# Patient Record
Sex: Female | Born: 1975 | Race: Black or African American | Hispanic: No | Marital: Married | Smoking: Never smoker
Health system: Southern US, Community
[De-identification: ages and names within clinical notes are randomized; demographics above are authoritative.]

## PROBLEM LIST (undated history)

## (undated) DIAGNOSIS — E282 Polycystic ovarian syndrome: Secondary | ICD-10-CM

## (undated) DIAGNOSIS — E139 Other specified diabetes mellitus without complications: Secondary | ICD-10-CM

## (undated) DIAGNOSIS — M549 Dorsalgia, unspecified: Secondary | ICD-10-CM

## (undated) DIAGNOSIS — D219 Benign neoplasm of connective and other soft tissue, unspecified: Secondary | ICD-10-CM

## (undated) HISTORY — PX: BREAST EXCISIONAL BIOPSY: SUR124

## (undated) HISTORY — DX: Other specified diabetes mellitus without complications: E13.9

## (undated) HISTORY — DX: Polycystic ovarian syndrome: E28.2

## (undated) HISTORY — DX: Dorsalgia, unspecified: M54.9

## (undated) HISTORY — PX: UTERINE FIBROID SURGERY: SHX826

## (undated) HISTORY — PX: BREAST SURGERY: SHX581

---

## 1998-11-22 ENCOUNTER — Emergency Department (HOSPITAL_COMMUNITY): Admission: EM | Admit: 1998-11-22 | Discharge: 1998-11-22 | Payer: Self-pay | Admitting: Emergency Medicine

## 2012-02-09 ENCOUNTER — Emergency Department (INDEPENDENT_AMBULATORY_CARE_PROVIDER_SITE_OTHER): Payer: Self-pay

## 2012-02-09 ENCOUNTER — Encounter (HOSPITAL_COMMUNITY): Payer: Self-pay | Admitting: Emergency Medicine

## 2012-02-09 ENCOUNTER — Emergency Department (INDEPENDENT_AMBULATORY_CARE_PROVIDER_SITE_OTHER)
Admission: EM | Admit: 2012-02-09 | Discharge: 2012-02-09 | Disposition: A | Discharge status: Home / Self Care | Payer: Self-pay | Source: Home / Self Care | Attending: Family Medicine | Admitting: Family Medicine

## 2012-02-09 DIAGNOSIS — S9030XA Contusion of unspecified foot, initial encounter: Secondary | ICD-10-CM

## 2012-02-09 MED ORDER — HYDROCODONE-ACETAMINOPHEN 5-325 MG PO TABS: ORAL_TABLET | ORAL | Status: AC

## 2012-02-09 NOTE — Discharge Instructions (Signed)
Your x-ray was negative for any acute findings or fracture. Wear the hard-soled shoe for at least a week, then try walking without it. If you are pain-free, you can discontinue the shoe. If your pain persists, continue the shoe for another week. If you are still having pain after 2 weeks, then re-present for evaluation. Take ibuprofen or naproxen (Aleve) around the clock for baseline pain control. Use prescription pain medication only as needed and as directed for severe pain.

## 2012-02-09 NOTE — ED Provider Notes (Signed)
History     CSN: [id number]  Arrival date & time 02/09/12  1839   First MD Initiated Contact with Patient 02/09/12 1852      Chief Complaint  Patient presents with  . Foot Pain    (Consider location/radiation/quality/duration/timing/severity/associated sxs/prior treatment) HPI Comments: Mia Wilson presents for evaluation of soreness in her left great toe. She denies any specific injury, reports onset of symptoms approximately 2 weeks ago. She reports pain in the ball of her left great toe with ambulation or with dorsiflexion of the toe. She denies any numbness, tingling, or weakness. There are no obvious sores on her foot. She states that she was concerned also because she has diabetes.  Patient is a 36 y.o. female presenting with lower extremity pain. The history is provided by the patient.  Foot Pain This is a new problem. The current episode started more than 1 week ago. The problem occurs constantly. The problem has not changed since onset.The symptoms are aggravated by walking and standing. The symptoms are relieved by nothing. She has tried nothing for the symptoms.    Past Medical History  Diagnosis Date  . Diabetes mellitus     History reviewed. No pertinent past surgical history.  No family history on file.  History  Substance Use Topics  . Smoking status: Not on file  . Smokeless tobacco: Not on file  . Alcohol Use: No    OB History    Grav Para Term Preterm Abortions TAB SAB Ect Mult Living                  Review of Systems  Constitutional: Negative.   HENT: Negative.   Eyes: Negative.   Respiratory: Negative.   Cardiovascular: Negative.   Gastrointestinal: Negative.   Genitourinary: Negative.   Musculoskeletal: Positive for gait problem.       LEFT great toe pain  Skin: Negative.   Neurological: Negative.     Allergies  Review of patient's allergies indicates no known allergies.  Home Medications   Current Outpatient Rx  Name Route Sig  Dispense Refill  . METFORMIN HCL 1000 MG PO TABS Oral Take 1,000 mg by mouth 2 (two) times daily with a meal.    . HYDROCODONE-ACETAMINOPHEN 5-325 MG PO TABS  Take one to two tablets every 4 to 6 hours as needed for pain 10 tablet 0    LMP 01/25/2012  Physical Exam  Nursing note and vitals reviewed. Constitutional: She is oriented to person, place, and time. She appears well-developed and well-nourished.  HENT:  Head: Normocephalic and atraumatic.  Eyes: EOM are normal.  Neck: Normal range of motion.  Pulmonary/Chest: Effort normal.  Musculoskeletal: Normal range of motion.       Left foot: She exhibits tenderness and bony tenderness.       Feet:  Neurological: She is alert and oriented to person, place, and time.  Skin: Skin is warm and dry.  Psychiatric: Her behavior is normal.    ED Course  Procedures (including critical care time)  Labs Reviewed - No data to display Dg Foot Complete Left  02/09/2012  *RADIOLOGY REPORT*  Clinical Data: Foot pain  LEFT FOOT - COMPLETE 3+ VIEW  Comparison: None  Findings: There is no evidence of fracture or dislocation.  There is no evidence of arthropathy or other focal bone abnormality. Soft tissues are unremarkable.  IMPRESSION:  Negative exam.  Original Report Authenticated By: Rosealee Albee, M.D.     1. Foot contusion  MDM  Xray reviewed by radiologist and myself; findings per above note; no acute findings; given hard sole shoe and hydrocodone PRN        Renaee Munda, MD 02/09/12 2304

## 2012-02-09 NOTE — ED Notes (Signed)
Pt. Stated, I've had a sore lt. Foot for 2 weeks, it hurts on the outside, No injury

## 2012-04-30 ENCOUNTER — Ambulatory Visit: Payer: Self-pay | Admitting: Family Medicine

## 2012-04-30 VITALS — BP 116/73 | HR 54 | Temp 97.7°F | Resp 16 | Ht 65.0 in | Wt 114.8 lb

## 2012-04-30 DIAGNOSIS — M545 Low back pain, unspecified: Secondary | ICD-10-CM

## 2012-04-30 DIAGNOSIS — R350 Frequency of micturition: Secondary | ICD-10-CM

## 2012-04-30 DIAGNOSIS — R634 Abnormal weight loss: Secondary | ICD-10-CM

## 2012-04-30 DIAGNOSIS — R5383 Other fatigue: Secondary | ICD-10-CM

## 2012-04-30 DIAGNOSIS — Z87442 Personal history of urinary calculi: Secondary | ICD-10-CM

## 2012-04-30 DIAGNOSIS — R5381 Other malaise: Secondary | ICD-10-CM

## 2012-04-30 DIAGNOSIS — E282 Polycystic ovarian syndrome: Secondary | ICD-10-CM | POA: Insufficient documentation

## 2012-04-30 DIAGNOSIS — E119 Type 2 diabetes mellitus without complications: Secondary | ICD-10-CM

## 2012-04-30 LAB — COMPREHENSIVE METABOLIC PANEL
ALT: 13 U/L (ref 0–35)
Alkaline Phosphatase: 52 U/L (ref 39–117)
CO2: 24 mEq/L (ref 19–32)
Creat: 0.67 mg/dL (ref 0.50–1.10)
Total Bilirubin: 1.9 mg/dL — ABNORMAL HIGH (ref 0.3–1.2)

## 2012-04-30 LAB — POCT CBC
Granulocyte percent: 38.5 %G (ref 37–80)
HCT, POC: 36.9 % — AB (ref 37.7–47.9)
Lymph, poc: 3.2 (ref 0.6–3.4)
MCHC: 17.3 g/dL — AB (ref 31.8–35.4)
MID (cbc): 0.4 (ref 0–0.9)
POC Granulocyte: 2.2 (ref 2–6.9)
POC LYMPH PERCENT: 55.1 %L — AB (ref 10–50)
POC MID %: 6.4 %M (ref 0–12)
Platelet Count, POC: 8.2 10*3/uL — AB (ref 142–424)
RDW, POC: 371 %

## 2012-04-30 LAB — POCT URINALYSIS DIPSTICK
Bilirubin, UA: NEGATIVE
Glucose, UA: NEGATIVE
Ketones, UA: 15
Leukocytes, UA: NEGATIVE
Nitrite, UA: NEGATIVE

## 2012-04-30 LAB — LIPID PANEL
Cholesterol: 205 mg/dL — ABNORMAL HIGH (ref 0–200)
LDL Cholesterol: 110 mg/dL — ABNORMAL HIGH (ref 0–99)
Total CHOL/HDL Ratio: 2.4 Ratio
VLDL: 8 mg/dL (ref 0–40)

## 2012-04-30 LAB — POCT UA - MICROSCOPIC ONLY: Mucus, UA: NEGATIVE

## 2012-04-30 LAB — POCT GLYCOSYLATED HEMOGLOBIN (HGB A1C): Hemoglobin A1C: 5.8

## 2012-04-30 MED ORDER — LISINOPRIL 5 MG PO TABS: 5.0000 mg | ORAL_TABLET | Freq: Every day | ORAL | Status: DC

## 2012-04-30 MED ORDER — METFORMIN HCL 1000 MG PO TABS: 1000.0000 mg | ORAL_TABLET | Freq: Two times a day (BID) | ORAL | Status: DC

## 2012-04-30 MED ORDER — CIPROFLOXACIN HCL 250 MG PO TABS: 250.0000 mg | ORAL_TABLET | Freq: Two times a day (BID) | ORAL | Status: AC

## 2012-04-30 NOTE — Progress Notes (Signed)
Subjective: Patient moved here from [place] and does not have her regular physician. Her Dr. [place] recently called and one more refill on her metformin. Her sugar was quite high when she is out of the medicine he insisted she go ahead and get a physician here. She is a stay-at-home mom. Other than metformin not on a regular medications. she has felt fatigued a lot lately. She has normal breathing. No chest pain. GI has some loose stools and cramping, maybe since she is taking metformin. He has some nocturia but no other major urinary problems. She has lost some weight. Not a lot of. Has had some right neck pain of late. She has a history of having had a kidney stone worries about the possibility when turning come back. She's not had any true kidney colic. She is a bit., Though was concerned that she is not getting enough proteins and she is Fish back in her diet.  Objective: Pleasant alert healthy-appearing Afro-American female. Throat clear. Neck supple without nodes. Chest is clear to auscultation. Heart regular without any murmurs. Abdomen soft without masses or tenderness. No CVA tenderness. Extremities unremarkable. Feet appear normal. Sensory is grossly intact in her feet.  Assessment: Type 2 diabetes mellitus History of kidney stones back pain Weight loss Fatigue   Plan: Check lab and proceed from there. Last year she had a hemoglobin A1c of 5.9 about October. Results for orders placed in visit on 04/30/12  POCT URINALYSIS DIPSTICK      Component Value Range   Color, UA yellow     Clarity, UA cloudy     Glucose, UA neg     Bilirubin, UA neg     Ketones, UA 15     Spec Grav, UA 1.025     Blood, UA neg     pH, UA 6.0     Protein, UA neg     Urobilinogen, UA 0.2     Nitrite, UA neg     Leukocytes, UA Negative    POCT UA - MICROSCOPIC ONLY      Component Value Range   WBC, Ur, HPF, POC 4-6     RBC, urine, microscopic 2-3     Bacteria, U Microscopic 1+     Mucus, UA neg     Epithelial cells, urine per micros 4-6     Crystals, Ur, HPF, POC neg     Casts, Ur, LPF, POC neg     Yeast, UA neg    POCT CBC      Component Value Range   WBC 5.8  4.6 - 10.2 (K/uL)   Lymph, poc 3.2  0.6 - 3.4    POC LYMPH PERCENT 55.1 (*) 10 - 50 (%L)   MID (cbc) 0.4  0 - 0.9    POC MID % 6.4  0 - 12 (%M)   POC Granulocyte 2.2  2 - 6.9    Granulocyte percent 38.5  37 - 80 (%G)   RBC 4.73  4.04 - 5.48 (M/uL)   Hemoglobin 11.4 (*) 12.2 - 16.2 (g/dL)   HCT, POC 96.0 (*) 45.4 - 47.9 (%)   MCV 78.0 (*) 80 - 97 (fL)   MCH, POC 24.1 (*) 27 - 31.2 (pg)   MCHC 17.3 (*) 31.8 - 35.4 (g/dL)   RDW, POC 098     Platelet Count, POC 8.2 (*) 142 - 424 (K/uL)   MPV    0 - 99.8 (fL)  POCT GLYCOSYLATED HEMOGLOBIN (HGB A1C)  Component Value Range   Hemoglobin A1C 5.8

## 2012-04-30 NOTE — Patient Instructions (Signed)
I recommend reading the American Diabetic Association website. Take aspirin 81 mg one daily

## 2012-05-01 ENCOUNTER — Encounter: Payer: Self-pay | Admitting: Family Medicine

## 2012-08-04 ENCOUNTER — Emergency Department (INDEPENDENT_AMBULATORY_CARE_PROVIDER_SITE_OTHER)
Admission: EM | Admit: 2012-08-04 | Discharge: 2012-08-04 | Disposition: A | Discharge status: Home / Self Care | Payer: Self-pay | Source: Home / Self Care | Attending: Family Medicine | Admitting: Family Medicine

## 2012-08-04 ENCOUNTER — Encounter (HOSPITAL_COMMUNITY): Payer: Self-pay | Admitting: Emergency Medicine

## 2012-08-04 DIAGNOSIS — T781XXA Other adverse food reactions, not elsewhere classified, initial encounter: Secondary | ICD-10-CM

## 2012-08-04 DIAGNOSIS — Z91018 Allergy to other foods: Secondary | ICD-10-CM

## 2012-08-04 DIAGNOSIS — L509 Urticaria, unspecified: Secondary | ICD-10-CM

## 2012-08-04 MED ORDER — EPINEPHRINE 0.3 MG/0.3ML IJ DEVI: 0.3000 mg | Freq: Once | INTRAMUSCULAR | Status: DC

## 2012-08-04 MED ORDER — HYDROXYZINE HCL 50 MG PO TABS: 25.0000 mg | ORAL_TABLET | Freq: Three times a day (TID) | ORAL | Status: AC | PRN

## 2012-08-04 NOTE — ED Notes (Signed)
C/o of frequent allergic reactions where she breaks out in hives. Ate peanut butter yest and felt throat was scratchy. Took benadryl  Which helped. Denies any symptoms today

## 2012-08-04 NOTE — ED Provider Notes (Signed)
History     CSN: [id number]  Arrival date & time 08/04/12  1109   First MD Initiated Contact with Patient 08/04/12 1131      Chief Complaint  Patient presents with  . Allergic Reaction    (Consider location/radiation/quality/duration/timing/severity/associated sxs/prior treatment) HPI Comments: 36 year old female with history of type 2 diabetes and polycystic ovarian syndrome. Here complaining of intermittent episodes of hives and throat discomfort after she eats certain foods like tomatoes or peanut butter. Patient states that she had these symptoms at least twice a month for about 2 years. Appear becoming worse. Last time was just today she ate peanut butter broke out in hives around her face had throat discomfort and labored breathing. Denies lip or tongue swelling. No wheezing. No dizziness. No nausea vomiting or diarrhea. Patient took a 50 mg Benadryl pill and went to sleep and woke up with no symptoms. Has had similar symptoms when drinking tomato soup and also sometimes she thinks her symptoms have been triggered by stress. Denies any new medications or other known exposures. Patient reports feeling well today with no symptoms or other complaints.   Past Medical History  Diagnosis Date  . Diabetes mellitus   . Back pain   . PCOS (polycystic ovarian syndrome)     Past Surgical History  Procedure Date  . Uterine fibroid surgery   . Breast surgery     benign    No family history on file.  History  Substance Use Topics  . Smoking status: Never Smoker   . Smokeless tobacco: Not on file  . Alcohol Use: No    OB History    Grav Para Term Preterm Abortions TAB SAB Ect Mult Living                  Review of Systems  Constitutional: Negative for fever, chills, appetite change and fatigue.  HENT: Negative for congestion, sore throat, mouth sores, trouble swallowing and voice change.   Respiratory: Negative for shortness of breath, wheezing and stridor.     Gastrointestinal: Negative for nausea, vomiting and diarrhea.  Neurological: Negative for dizziness and headaches.  All other systems reviewed and are negative.    Allergies  Review of patient's allergies indicates no known allergies.  Home Medications   Current Outpatient Rx  Name Route Sig Dispense Refill  . EPINEPHRINE 0.3 MG/0.3ML IJ DEVI Intramuscular Inject 0.3 mLs (0.3 mg total) into the muscle once. 1 Device 0  . HYDROXYZINE HCL 50 MG PO TABS Oral Take 0.5 tablets (25 mg total) by mouth every 8 (eight) hours as needed for itching. 20 tablet 0  . LISINOPRIL 5 MG PO TABS Oral Take 1 tablet (5 mg total) by mouth daily. 90 tablet 1  . METFORMIN HCL 1000 MG PO TABS Oral Take 1 tablet (1,000 mg total) by mouth 2 (two) times daily with a meal. 180 tablet 1    BP 112/69  Pulse 62  Temp 97.9 F (36.6 C) (Oral)  Resp 16  SpO2 100%  LMP 07/27/2012  Physical Exam  Nursing note and vitals reviewed. Constitutional: She is oriented to person, place, and time. She appears well-developed and well-nourished. No distress.  HENT:  Head: Normocephalic and atraumatic.  Mouth/Throat: Oropharynx is clear and moist. No oropharyngeal exudate.  Eyes: Conjunctivae normal are normal.  Neck: Neck supple. No thyromegaly present.  Cardiovascular: Normal rate, regular rhythm and normal heart sounds.   Pulmonary/Chest: Effort normal and breath sounds normal. No respiratory distress. She has no  wheezes. She has no rales.  Abdominal: Soft. There is no tenderness.  Lymphadenopathy:    She has no cervical adenopathy.  Neurological: She is alert and oriented to person, place, and time.  Skin: No rash noted.    ED Course  Procedures (including critical care time)  Labs Reviewed - No data to display No results found.   1. Hives   2. Multiple food allergies       MDM  Impress mild to moderate food allergy?. Normal physical exam today. Prescribed hydroxyzine when necessary as per patient  request provided EpiPen. Denies history of cardiac problems. Discussed severe anaphylaxis symptoms and provided detailed instructions for the use of EpiPen also discussed possible related side effects. Patient will keep a monthly diary to record symptoms and possible triggers before her next appointment with her primary Dr.        Sharin Grave, MD 08/08/12 [phone]

## 2013-05-16 ENCOUNTER — Other Ambulatory Visit: Payer: Self-pay | Admitting: Obstetrics and Gynecology

## 2013-05-16 ENCOUNTER — Other Ambulatory Visit (HOSPITAL_COMMUNITY)
Admission: RE | Admit: 2013-05-16 | Discharge: 2013-05-16 | Disposition: A | Discharge status: Home / Self Care | Payer: BC Managed Care – PPO | Source: Ambulatory Visit | Attending: Obstetrics and Gynecology | Admitting: Obstetrics and Gynecology

## 2013-05-16 DIAGNOSIS — Z1151 Encounter for screening for human papillomavirus (HPV): Secondary | ICD-10-CM | POA: Insufficient documentation

## 2013-05-16 DIAGNOSIS — Z01419 Encounter for gynecological examination (general) (routine) without abnormal findings: Secondary | ICD-10-CM | POA: Insufficient documentation

## 2013-05-29 ENCOUNTER — Other Ambulatory Visit: Payer: Self-pay | Admitting: Endocrinology

## 2013-05-29 DIAGNOSIS — E119 Type 2 diabetes mellitus without complications: Secondary | ICD-10-CM

## 2013-05-29 DIAGNOSIS — E7800 Pure hypercholesterolemia, unspecified: Secondary | ICD-10-CM

## 2013-06-01 ENCOUNTER — Other Ambulatory Visit: Payer: BC Managed Care – PPO

## 2013-06-06 ENCOUNTER — Ambulatory Visit: Payer: Self-pay | Admitting: Endocrinology

## 2013-06-16 ENCOUNTER — Other Ambulatory Visit (INDEPENDENT_AMBULATORY_CARE_PROVIDER_SITE_OTHER): Payer: BC Managed Care – PPO

## 2013-06-16 DIAGNOSIS — E119 Type 2 diabetes mellitus without complications: Secondary | ICD-10-CM

## 2013-06-16 DIAGNOSIS — E7800 Pure hypercholesterolemia, unspecified: Secondary | ICD-10-CM

## 2013-06-16 LAB — BASIC METABOLIC PANEL
BUN: 10 mg/dL (ref 6–23)
Calcium: 9.3 mg/dL (ref 8.4–10.5)
GFR: 108.96 mL/min (ref >60.00)
Glucose, Bld: 121 mg/dL — ABNORMAL HIGH (ref 70–99)
Sodium: 136 mEq/L (ref 135–145)

## 2013-06-16 LAB — URINALYSIS
Bilirubin Urine: NEGATIVE
Ketones, ur: NEGATIVE
Leukocytes, UA: NEGATIVE
Specific Gravity, Urine: >=1.03 (ref 1.000–1.030)
Urobilinogen, UA: 0.2 (ref 0.0–1.0)

## 2013-06-16 LAB — LIPID PANEL
HDL: 78.3 mg/dL (ref >39.00)
Total CHOL/HDL Ratio: 2
VLDL: 16.6 mg/dL (ref 0.0–40.0)

## 2013-06-20 ENCOUNTER — Ambulatory Visit (INDEPENDENT_AMBULATORY_CARE_PROVIDER_SITE_OTHER): Payer: BC Managed Care – PPO | Admitting: Endocrinology

## 2013-06-20 ENCOUNTER — Encounter: Payer: Self-pay | Admitting: Endocrinology

## 2013-06-20 VITALS — BP 102/54 | HR 60 | Temp 98.6°F | Resp 10 | Ht 66.0 in | Wt 121.6 lb

## 2013-06-20 DIAGNOSIS — IMO0001 Reserved for inherently not codable concepts without codable children: Secondary | ICD-10-CM

## 2013-06-20 DIAGNOSIS — E1165 Type 2 diabetes mellitus with hyperglycemia: Secondary | ICD-10-CM | POA: Insufficient documentation

## 2013-06-20 MED ORDER — SITAGLIP PHOS-METFORMIN HCL ER 100-1000 MG PO TB24: 100.0000 mg | ORAL_TABLET | Freq: Every day | ORAL | Status: DC

## 2013-06-20 NOTE — Progress Notes (Signed)
[name], female   DOB: 08/10/1976, 37 y.o.   MRN: [id number]  Reason for Appointment: Diabetes follow-up   History of Present Illness   Diagnosis: date of diagnosis: 2009, initially asymptomatic, has type 2 diabetes.   PAST history:  She was apparently diagnosed to have diabetes by her gynecologist when she was complaining of excessive fatigue. She does not know what her initial blood sugar was but her A1c was 6.8. Before she started on medications her blood sugars were as high as 200+ after meals. She was started on medications after seeing a couple of different physicians and initially did well with 500 mg twice a day. In 2012 her A1c was relatively higher at 6.2 and although sugars are fairly good she was told to take metformin twice a day. This improved her blood sugars and her A1c was 5.9 in 12/12.   RECENT history: She tends to have diarrhea with metformin especially twice a day and also was losing weight. Because of this intolerance and relatively higher readings she was switched to Janumet. However because of her insurance is not covering this she stopped this 2-3 months ago  On her own she has been taking the 1000 mg metformin only at dinnertime.  She thinks her FASTING and after meals blood sugars are relatively higher. She has less  diarrhea with a lower dose of metformin   Side effects from medications: Diarrhea with metfomin, mild with 1g. She does take this with food  Monitors blood glucose: Occasionally.  Glucometer: One Touch.  Blood Glucose readings: Before breakfast: upto 140 Pc <150, did not bring her monitor  Hypoglycemia tends to occur: never.   Meals: 3 meals per day, vegetarian, tries to get protein, counts carbs.  Physical activity: exercise: 30 min per week.   Dietician visit: Most recent: 03/2013  Difficulties with medication adherence: Yes has side effects from taking metformin twice a day and is using this only daily in evening.   The last  HbgA1c was 6.3% on 02/24/13 and 5.9 in 12/12.   HYPERLIPIDEMIA:  previously had LDL of 108, now 95  Appointment on 06/16/2013  Component Date Value Range Status  . Hemoglobin A1C 06/16/2013 7.1* 4.6 - 6.5 % Final   Glycemic Control Guidelines for People with Diabetes:Non Diabetic:  <6%Goal of Therapy: <7%Additional Action Suggested:  >8%   . Sodium 06/16/2013 136  135 - 145 mEq/L Final  . Potassium 06/16/2013 4.0  3.5 - 5.1 mEq/L Final  . Chloride 06/16/2013 103  96 - 112 mEq/L Final  . CO2 06/16/2013 26  19 - 32 mEq/L Final  . Glucose, Bld 06/16/2013 121* 70 - 99 mg/dL Final  . BUN 78/29/5621 10  6 - 23 mg/dL Final  . Creatinine, Ser 06/16/2013 0.8  0.4 - 1.2 mg/dL Final  . Calcium 30/86/5784 9.3  8.4 - 10.5 mg/dL Final  . GFR 69/62/9528 108.96  >60.00 mL/min Final  . Color, Urine 06/16/2013 LT. YELLOW  Yellow;Lt. Yellow Final  . APPearance 06/16/2013 CLEAR  Clear Final  . Specific Gravity, Urine 06/16/2013 >=1.030  1.000 - 1.030 Final  . pH 06/16/2013 6.0  5.0 - 8.0 Final  . Total Protein, Urine 06/16/2013 NEGATIVE  Negative Final  . Urine Glucose 06/16/2013 NEGATIVE  Negative Final  . Ketones, ur 06/16/2013 NEGATIVE  Negative Final  . Bilirubin Urine 06/16/2013 NEGATIVE  Negative Final  . Hgb urine dipstick 06/16/2013 TRACE-LYSED  Negative Final  . Urobilinogen, UA 06/16/2013 0.2  0.0 - 1.0 Final  .  Leukocytes, UA 06/16/2013 NEGATIVE  Negative Final  . Nitrite 06/16/2013 NEGATIVE  Negative Final  . Microalb, Ur 06/16/2013 0.9  0.0 - 1.9 mg/dL Final  . Creatinine,U 40/98/1191 206.2   Final  . Microalb Creat Ratio 06/16/2013 0.4  0.0 - 30.0 mg/g Final  . Cholesterol 06/16/2013 190  0 - 200 mg/dL Final   ATP III Classification       Desirable:  < 200 mg/dL               Borderline High:  200 - 239 mg/dL          High:  > = 478 mg/dL  . Triglycerides 06/16/2013 83.0  0.0 - 149.0 mg/dL Final   Normal:  <295 mg/dLBorderline High:  150 - 199 mg/dL  . HDL 06/16/2013 78.30  >39.00 mg/dL  Final  . VLDL 62/13/0865 16.6  0.0 - 40.0 mg/dL Final  . LDL Cholesterol 06/16/2013 95  0 - 99 mg/dL Final  . Total CHOL/HDL Ratio 06/16/2013 2   Final                  Men          Women1/2 Average Risk     3.4          3.3Average Risk          5.0          4.42X Average Risk          9.6          7.13X Average Risk          15.0          11.0                          Medication List       This list is accurate as of: 06/20/13  8:30 AM.  Always use your most recent med list.               EPINEPHrine 0.3 mg/0.3 mL Devi  Commonly known as:  EPIPEN  Inject 0.3 mLs (0.3 mg total) into the muscle once.     metFORMIN 1000 MG tablet  Commonly known as:  GLUCOPHAGE  Take 1 tablet (1,000 mg total) by mouth 2 (two) times daily with a meal.        Allergies: No Known Allergies  Past Medical History  Diagnosis Date  . Diabetes mellitus   . Back pain   . PCOS (polycystic ovarian syndrome)     Past Surgical History  Procedure Laterality Date  . Uterine fibroid surgery    . Breast surgery      benign    No family history on file.  Social History:  reports that she has never smoked. She does not have any smokeless tobacco history on file. She reports that she does not drink alcohol or use illicit drugs.  Review of Systems -  No history of hypertension  Normal lipids without medication  Has history of uterine fibroids   Examination:   BP 102/54  Pulse 60  Temp(Src) 98.6 F (37 C)  Resp 10  Ht 5\' 6"  (1.676 m)  Wt 121 lb 9.6 oz (55.157 kg)  BMI 19.64 kg/m2  SpO2 99%  LMP 06/12/2013  Body mass index is 19.64 kg/(m^2).   Assesment:   DIABETES:  The patient's diabetes control appears to be not as good with metformin alone and she had  previously done better with Janumet. A1c is higher than usual at 7.1 Because of her difficulty affording her Janumet she has not taken this but she thinks it will be covered on her new plan She is otherwise doing fairly well with  her diet and exercise regimen and still has a relatively low body weight   PLAN:   1. To check home blood sugars on waking up or 2 hours after any meal  2. Walking or other exercise for up to 30 minutes every day.  3. Medication changes: Go back to Janumet XR, will limit the metformin to 1000 mg   Mia Wilson 06/20/2013, 8:30 AM

## 2013-06-20 NOTE — Patient Instructions (Addendum)
Janumet XR 100/1000 once daily Restart testing

## 2013-08-21 ENCOUNTER — Other Ambulatory Visit (INDEPENDENT_AMBULATORY_CARE_PROVIDER_SITE_OTHER): Payer: BC Managed Care – PPO

## 2013-08-21 ENCOUNTER — Encounter (HOSPITAL_COMMUNITY): Payer: Self-pay | Admitting: Family Medicine

## 2013-08-21 ENCOUNTER — Emergency Department (HOSPITAL_COMMUNITY)
Admission: EM | Admit: 2013-08-21 | Discharge: 2013-08-22 | Disposition: A | Discharge status: Home / Self Care | Payer: BC Managed Care – PPO | Attending: Emergency Medicine | Admitting: Emergency Medicine

## 2013-08-21 ENCOUNTER — Emergency Department (HOSPITAL_COMMUNITY): Payer: BC Managed Care – PPO

## 2013-08-21 DIAGNOSIS — Z79899 Other long term (current) drug therapy: Secondary | ICD-10-CM | POA: Insufficient documentation

## 2013-08-21 DIAGNOSIS — Z3202 Encounter for pregnancy test, result negative: Secondary | ICD-10-CM | POA: Insufficient documentation

## 2013-08-21 DIAGNOSIS — E119 Type 2 diabetes mellitus without complications: Secondary | ICD-10-CM | POA: Insufficient documentation

## 2013-08-21 DIAGNOSIS — N200 Calculus of kidney: Secondary | ICD-10-CM | POA: Insufficient documentation

## 2013-08-21 DIAGNOSIS — R109 Unspecified abdominal pain: Secondary | ICD-10-CM

## 2013-08-21 DIAGNOSIS — R110 Nausea: Secondary | ICD-10-CM | POA: Insufficient documentation

## 2013-08-21 DIAGNOSIS — Z8742 Personal history of other diseases of the female genital tract: Secondary | ICD-10-CM | POA: Insufficient documentation

## 2013-08-21 HISTORY — DX: Benign neoplasm of connective and other soft tissue, unspecified: D21.9

## 2013-08-21 LAB — POCT I-STAT, CHEM 8
BUN: 14 mg/dL (ref 6–23)
Calcium, Ion: 1.17 mmol/L (ref 1.12–1.23)
Chloride: 103 mEq/L (ref 96–112)
Creatinine, Ser: 1 mg/dL (ref 0.50–1.10)
Glucose, Bld: 159 mg/dL — ABNORMAL HIGH (ref 70–99)
Sodium: 138 mEq/L (ref 135–145)
TCO2: 23 mmol/L (ref 0–100)

## 2013-08-21 LAB — BASIC METABOLIC PANEL
Calcium: 9.2 mg/dL (ref 8.4–10.5)
GFR: 99.71 mL/min (ref >60.00)
Sodium: 138 mEq/L (ref 135–145)

## 2013-08-21 LAB — URINALYSIS, ROUTINE W REFLEX MICROSCOPIC
Ketones, ur: NEGATIVE mg/dL
Leukocytes, UA: NEGATIVE
Nitrite: NEGATIVE
Protein, ur: 30 mg/dL — AB

## 2013-08-21 LAB — URINE MICROSCOPIC-ADD ON

## 2013-08-21 LAB — GLUCOSE, CAPILLARY: Glucose-Capillary: 101 mg/dL — ABNORMAL HIGH (ref 70–99)

## 2013-08-21 MED ORDER — OXYCODONE-ACETAMINOPHEN 5-325 MG PO TABS: 1.0000 | ORAL_TABLET | Freq: Four times a day (QID) | ORAL | Status: DC | PRN

## 2013-08-21 MED ORDER — SODIUM CHLORIDE 0.9 % IV BOLUS (SEPSIS)
1000.0000 mL | Freq: Once | INTRAVENOUS | Status: AC
  Administered 2013-08-21: 1000 mL via INTRAVENOUS

## 2013-08-21 MED ORDER — KETOROLAC TROMETHAMINE 30 MG/ML IJ SOLN
30.0000 mg | Freq: Once | INTRAMUSCULAR | Status: AC
  Administered 2013-08-21: 30 mg via INTRAVENOUS
  Filled 2013-08-21: qty 1

## 2013-08-21 MED ORDER — MORPHINE SULFATE 4 MG/ML IJ SOLN
4.0000 mg | Freq: Once | INTRAMUSCULAR | Status: AC
  Administered 2013-08-21: 4 mg via INTRAVENOUS
  Filled 2013-08-21: qty 1

## 2013-08-21 MED ORDER — OXYCODONE-ACETAMINOPHEN 5-325 MG PO TABS
1.0000 | ORAL_TABLET | Freq: Once | ORAL | Status: AC
  Administered 2013-08-22: 1 via ORAL
  Filled 2013-08-21: qty 1

## 2013-08-21 MED ORDER — TAMSULOSIN HCL 0.4 MG PO CAPS: 0.4000 mg | ORAL_CAPSULE | Freq: Every day | ORAL | Status: DC

## 2013-08-21 MED ORDER — ONDANSETRON HCL 4 MG/2ML IJ SOLN
4.0000 mg | Freq: Once | INTRAMUSCULAR | Status: AC
  Administered 2013-08-21: 4 mg via INTRAVENOUS
  Filled 2013-08-21: qty 2

## 2013-08-21 MED ORDER — ONDANSETRON 4 MG PO TBDP: ORAL_TABLET | ORAL | Status: DC

## 2013-08-21 NOTE — ED Notes (Signed)
Unable to obtain blood for labs at this time due to patient being transported to [place]

## 2013-08-21 NOTE — ED Notes (Signed)
Patient states that she has had right flank pain since yesterday. States pain has gotten worse since yesterday. States urine has been dark and having lower abdominal cramping for about a week. States urine has had a strong smell to it. States she does have a history of kidney stones.

## 2013-08-21 NOTE — ED Provider Notes (Signed)
CSN: [id number]     Arrival date & time 08/21/13  1959 History   First MD Initiated Contact with Patient 08/21/13 2208     Chief Complaint  Patient presents with  . Flank Pain   (Consider location/radiation/quality/duration/timing/severity/associated sxs/prior Treatment) HPI Comments: Intermittent R flank pain. Thought she had a UTI. Patient states similar to prior kidney stones.   Patient is a 37 y.o. female presenting with flank pain. The history is provided by the patient.  Flank Pain This is a recurrent problem. The current episode started yesterday. The problem occurs daily. The problem has been gradually worsening. Associated symptoms include abdominal pain. Pertinent negatives include no chest pain and no shortness of breath. Nothing aggravates the symptoms. The symptoms are relieved by medications.    Past Medical History  Diagnosis Date  . Diabetes mellitus   . Back pain   . PCOS (polycystic ovarian syndrome)   . Fibroids    Past Surgical History  Procedure Laterality Date  . Uterine fibroid surgery    . Breast surgery      benign   No family history on file. History  Substance Use Topics  . Smoking status: Never Smoker   . Smokeless tobacco: Not on file  . Alcohol Use: No   OB History   Grav Para Term Preterm Abortions TAB SAB Ect Mult Living                 Review of Systems  Constitutional: Negative for fever.  Respiratory: Negative for cough and shortness of breath.   Cardiovascular: Negative for chest pain.  Gastrointestinal: Positive for nausea and abdominal pain. Negative for vomiting.  Genitourinary: Positive for flank pain.  All other systems reviewed and are negative.    Allergies  Review of patient's allergies indicates no known allergies.  Home Medications   Current Outpatient Rx  Name  Route  Sig  Dispense  Refill  . EPINEPHrine (EPIPEN) 0.3 mg/0.3 mL DEVI   Intramuscular   Inject 0.3 mLs (0.3 mg total) into the muscle once.   1  Device   0   . Fe Fum-FePoly-FA-Vit C-Vit B3 (INTEGRA F PO)   Oral   Take by mouth.         . Multiple Vitamin (MULTIVITAMIN WITH MINERALS) TABS   Oral   Take 1 tablet by mouth daily.         . Norethin Ace-Eth Estrad-FE (LOMEDIA 24 FE PO)   Oral   Take by mouth daily.         . SitaGLIPtin-MetFORMIN HCl (JANUMET XR) [phone] MG TB24   Oral   Take 100-1,000 mg by mouth daily after supper.   30 tablet   2    BP 106/73  Pulse 72  Temp(Src) 98 F (36.7 C) (Oral)  Resp 20  Ht 5\' 5"  (1.651 m)  Wt 119 lb (53.978 kg)  BMI 19.8 kg/m2  SpO2 100%  LMP 08/09/2013 Physical Exam  Nursing note and vitals reviewed. Constitutional: She is oriented to person, place, and time. She appears well-developed and well-nourished. No distress.  HENT:  Head: Normocephalic and atraumatic.  Eyes: EOM are normal. Pupils are equal, round, and reactive to light.  Neck: Normal range of motion. Neck supple.  Cardiovascular: Normal rate and regular rhythm.  Exam reveals no friction rub.   No murmur heard. Pulmonary/Chest: Effort normal and breath sounds normal. No respiratory distress. She has no wheezes. She has no rales.  Abdominal: Soft. She exhibits no distension.  There is no tenderness (no CVA tenderness). There is no rebound.  Musculoskeletal: Normal range of motion. She exhibits no edema.  Neurological: She is alert and oriented to person, place, and time.  Skin: She is not diaphoretic.    ED Course  Procedures (including critical care time) Labs Review Labs Reviewed  GLUCOSE, CAPILLARY - Abnormal; Notable for the following:    Glucose-Capillary 101 (*)    All other components within normal limits  URINALYSIS, ROUTINE W REFLEX MICROSCOPIC - Abnormal; Notable for the following:    Color, Urine AMBER (*)    APPearance CLOUDY (*)    Specific Gravity, Urine 1.033 (*)    Hgb urine dipstick LARGE (*)    Bilirubin Urine SMALL (*)    Protein, ur 30 (*)    All other components within  normal limits  URINE MICROSCOPIC-ADD ON - Abnormal; Notable for the following:    Bacteria, UA FEW (*)    Crystals CA OXALATE CRYSTALS (*)    All other components within normal limits  POCT PREGNANCY, URINE   Imaging Review Ct Abdomen Pelvis Wo Contrast  08/21/2013   CLINICAL DATA:  Right flank pain.  EXAM: CT ABDOMEN AND PELVIS WITHOUT CONTRAST  TECHNIQUE: Multidetector CT imaging of the abdomen and pelvis was performed following the standard protocol without intravenous contrast.  COMPARISON:  None.  FINDINGS: The lung bases are clear.  The unenhanced appearance of the liver and spleen are unremarkable. No biliary dilatation. The gallbladder is normal. No common bowel duct dilatation. The pancreas appears normal. The adrenal glands and left kidney are normal. The right kidney demonstrates moderate hydronephrosis. There is also right-sided hydronephrosis down to a small, 3 x 1.5 mm mid ureteral calculus located at the L4 vertebral body level.  The stomach, duodenum, small bowel and colon are grossly normal without oral contrast. No mesenteric or retroperitoneal mass or adenopathy. The aorta is normal.  The uterus is enlarged. Suspect multiple fibroids. There is a probable cyst associated with the right ovary. No pelvic mass or adenopathy. No inguinal mass or adenopathy.  The bony structures are unremarkable.  IMPRESSION: 3 x 1.5 mm mid right ureteral calculus located at the L4 vertebral body level causing moderate hydroureteronephrosis.  Enlarged uterus with probable multiple fibroids.   Electronically Signed   By: Loralie Champagne M.D.   On: 08/21/2013 22:32    MDM   1. Kidney stone   2. Abdominal pain   3. Flank pain    40F here with R flank pain, radiates to R groin. Similar to prior kidney stones. Some urinary symptoms over past few days, associated nausea without vomiting.  AFVSS. R flank pain, no CVA tenderness. No fevers. UA with blood, will CT to look for stones. No prior interventions  required for stones.  No UTI, normal creatinine.  Kidney stone seen on CT scan. Small, will likely pass. Will give pain meds, nausea meds, flomax. Given Urology f/u. Stable for discharge.   I have reviewed all labs and imaging and considered them in my medical decision making.    Dagmar Hait, MD 08/21/13 [phone]

## 2013-08-25 ENCOUNTER — Ambulatory Visit (INDEPENDENT_AMBULATORY_CARE_PROVIDER_SITE_OTHER): Payer: BC Managed Care – PPO | Admitting: Endocrinology

## 2013-08-25 ENCOUNTER — Encounter: Payer: Self-pay | Admitting: Endocrinology

## 2013-08-25 VITALS — BP 110/72 | HR 66 | Temp 98.3°F | Resp 10 | Ht 65.0 in | Wt 127.2 lb

## 2013-08-25 DIAGNOSIS — R5381 Other malaise: Secondary | ICD-10-CM

## 2013-08-25 MED ORDER — GLIMEPIRIDE 1 MG PO TABS
ORAL_TABLET | ORAL | Status: DC
Start: 2013-08-25 — End: 2013-12-27

## 2013-08-25 NOTE — Progress Notes (Signed)
[name], female   DOB: July 23, 1976, 37 y.o.   MRN: [id number]  Reason for Appointment: Diabetes follow-up   History of Present Illness   Diagnosis: date of diagnosis: 2009, initially asymptomatic, has type 2 diabetes.   PAST history:  She was apparently diagnosed to have diabetes by her gynecologist when she was complaining of excessive fatigue. She does not know what her initial blood sugar was but her A1c was 6.8. Before she started on medications her blood sugars were as high as 200+ after meals. She was started on medications after seeing a couple of different physicians and initially did well with 500 mg twice a day. In 2012 her A1c was relatively higher at 6.2 and although sugars are fairly good she was told to take metformin twice a day. This improved her blood sugars and her A1c was 5.9 in 12/12.   RECENT history:  Because of diarrhea and weight loss with metformin 2 g and relatively higher readings she was switched to Janumet XR 100/1000.  She did restart this on her last visit when she had a new insurance and had no difficulty with coverage Does not have any significant diarrhea now and weight has improved.  She did try an extra metformin in the evening once but got diarrhea with this, however her glucose was 114 the next morning She does appear to have relatively high readings in the mornings however. Generally not checking readings after meals She does think also that she has had more stress lately   Side effects from medications: Diarrhea with metfomin, 2 g She does take this with food  Monitors blood glucose: Occasionally.  Glucometer: One Touch.  Blood Glucose readings: Before breakfast: 114-154, nonfasting 104, 206, 138  Hypoglycemia tends to occur: None   Meals: 3 meals per day, vegetarian, tries to get protein, counts carbs.  Physical activity: exercise: Recently only once per week  Dietician visit: Most recent: 03/2013  Difficulties with medication  adherence: None  The previous HbgA1c was 6.3% on 02/24/13 and 5.9 in 12/12.  Lab Results  Component Value Date   HGBA1C 7.1* 06/16/2013    HYPERLIPIDEMIA:  previously had LDL of 108, now 95  Admission on 08/21/2013, Discharged on 08/22/2013  Component Date Value Range Status  . Glucose-Capillary 08/21/2013 101* 70 - 99 mg/dL Final  . Color, Urine 41/32/4401 AMBER* YELLOW Final   BIOCHEMICALS MAY BE AFFECTED BY COLOR  . APPearance 08/21/2013 CLOUDY* CLEAR Final  . Specific Gravity, Urine 08/21/2013 1.033* 1.005 - 1.030 Final  . pH 08/21/2013 5.5  5.0 - 8.0 Final  . Glucose, UA 08/21/2013 NEGATIVE  NEGATIVE mg/dL Final  . Hgb urine dipstick 08/21/2013 LARGE* NEGATIVE Final  . Bilirubin Urine 08/21/2013 SMALL* NEGATIVE Final  . Ketones, ur 08/21/2013 NEGATIVE  NEGATIVE mg/dL Final  . Protein, ur 02/72/5366 30* NEGATIVE mg/dL Final  . Urobilinogen, UA 08/21/2013 1.0  0.0 - 1.0 mg/dL Final  . Nitrite 44/01/4741 NEGATIVE  NEGATIVE Final  . Leukocytes, UA 08/21/2013 NEGATIVE  NEGATIVE Final  . Preg Test, Ur 08/21/2013 NEGATIVE  NEGATIVE Final   Comment:                                 THE SENSITIVITY OF THIS                          METHODOLOGY IS >24 mIU/mL  . Squamous Epithelial /  LPF 08/21/2013 RARE  RARE Final  . WBC, UA 08/21/2013 0-2  <3 WBC/hpf Final  . RBC / HPF 08/21/2013 TOO NUMEROUS TO COUNT  <3 RBC/hpf Final  . Bacteria, UA 08/21/2013 FEW* RARE Final  . Crystals 08/21/2013 CA OXALATE CRYSTALS* NEGATIVE Final  . Urine-Other 08/21/2013 MUCOUS PRESENT   Final  . Sodium 08/21/2013 138  135 - 145 mEq/L Final  . Potassium 08/21/2013 4.4  3.5 - 5.1 mEq/L Final  . Chloride 08/21/2013 103  96 - 112 mEq/L Final  . BUN 08/21/2013 14  6 - 23 mg/dL Final  . Creatinine, Ser 08/21/2013 1.00  0.50 - 1.10 mg/dL Final  . Glucose, Bld 09/81/1914 159* 70 - 99 mg/dL Final  . Calcium, Ion 78/29/5621 1.17  1.12 - 1.23 mmol/L Final  . TCO2 08/21/2013 23  0 - 100 mmol/L Final  . Hemoglobin  08/21/2013 14.3  12.0 - 15.0 g/dL Final  . HCT 30/86/5784 42.0  36.0 - 46.0 % Final  Appointment on 08/21/2013  Component Date Value Range Status  . Sodium 08/21/2013 138  135 - 145 mEq/L Final  . Potassium 08/21/2013 3.8  3.5 - 5.1 mEq/L Final  . Chloride 08/21/2013 105  96 - 112 mEq/L Final  . CO2 08/21/2013 26  19 - 32 mEq/L Final  . Glucose, Bld 08/21/2013 138* 70 - 99 mg/dL Final  . BUN 69/62/9528 11  6 - 23 mg/dL Final  . Creatinine, Ser 08/21/2013 0.8  0.4 - 1.2 mg/dL Final  . Calcium 41/32/4401 9.2  8.4 - 10.5 mg/dL Final  . GFR 02/72/5366 99.71  >60.00 mL/min Final  . Fructosamine 08/21/2013 345* <285 umol/L Final   Comment:                            Variations in levels of serum proteins (albumin and immunoglobulins)                          may affect fructosamine results.                                 Medication List       This list is accurate as of: 08/25/13  8:40 AM.  Always use your most recent med list.               BAYER CONTOUR NEXT TEST test strip  Generic drug:  glucose blood  daily.     EPINEPHrine 0.3 mg/0.3 mL Devi  Commonly known as:  EPIPEN  Inject 0.3 mLs (0.3 mg total) into the muscle once.     INTEGRA F PO  Take by mouth.     LOMEDIA 24 FE PO  Take by mouth daily.     multivitamin with minerals Tabs tablet  Take 1 tablet by mouth daily.     ondansetron 4 MG disintegrating tablet  Commonly known as:  ZOFRAN ODT  4mg  ODT q4 hours prn nausea/vomit     oxyCODONE-acetaminophen 5-325 MG per tablet  Commonly known as:  PERCOCET  Take 1 tablet by mouth every 6 (six) hours as needed for pain.     SitaGLIPtin-MetFORMIN HCl [phone] MG Tb24  Commonly known as:  JANUMET XR  Take 100-1,000 mg by mouth daily after supper.     tamsulosin 0.4 MG Caps capsule  Commonly known as:  FLOMAX  Take 1  capsule (0.4 mg total) by mouth daily after breakfast.        Allergies: No Known Allergies  Past Medical History  Diagnosis Date  .  Diabetes mellitus   . Back pain   . PCOS (polycystic ovarian syndrome)   . Fibroids     Past Surgical History  Procedure Laterality Date  . Uterine fibroid surgery    . Breast surgery      benign    No family history on file.  Social History:  reports that she has never smoked. She does not have any smokeless tobacco history on file. She reports that she does not drink alcohol or use illicit drugs.  Review of Systems -  She is complaining of feeling tired in the last month or so  No history of hypertension  Normal lipids without medication  Recently had kidney stone  Has history of uterine fibroids   Examination:   BP 110/72  Pulse 66  Temp(Src) 98.3 F (36.8 C)  Resp 10  Ht 5\' 5"  (1.651 m)  Wt 127 lb 3.2 oz (57.698 kg)  BMI 21.17 kg/m2  SpO2 96%  LMP 08/09/2013  Body mass index is 21.17 kg/(m^2).   Assesment:   DIABETES:  The patient's diabetes control appears to be fair with Janumet with relatively higher fasting readings. She probably will not be able to tolerate more than 1000 mg of metformin However she has not done many readings after meals and not clear what her overall control level is As a trial she can use 0.5 mg of Amaryl at bedtime to see if fasting readings are better and if not can use 1 mg. Discussed potential hypoglycemia with this Also discussed doing regular exercise  Fatigue: She can get a CBC checked on her next visit with the urologist  [facility] 08/25/2013, 8:40 AM

## 2013-08-25 NOTE — Patient Instructions (Addendum)
Please check blood sugars at least half the time about 2 hours after any meal and as directed on waking up. Please bring blood sugar monitor to each visit  More exercise  Glimeperide 1mg , 1/2 at bedtime and if am still > 130 use 1 tab

## 2013-08-29 ENCOUNTER — Ambulatory Visit (INDEPENDENT_AMBULATORY_CARE_PROVIDER_SITE_OTHER): Payer: BC Managed Care – PPO | Admitting: Endocrinology

## 2013-08-29 ENCOUNTER — Ambulatory Visit: Payer: BC Managed Care – PPO | Admitting: Endocrinology

## 2013-08-29 ENCOUNTER — Encounter: Payer: Self-pay | Admitting: Endocrinology

## 2013-08-29 VITALS — BP 122/74 | HR 103 | Resp 12 | Ht 65.0 in | Wt 120.3 lb

## 2013-08-29 DIAGNOSIS — R350 Frequency of micturition: Secondary | ICD-10-CM

## 2013-08-29 LAB — URINALYSIS, ROUTINE W REFLEX MICROSCOPIC
Ketones, ur: 40
Total Protein, Urine: NEGATIVE
Urine Glucose: NEGATIVE
pH: 7 (ref 5.0–8.0)

## 2013-08-29 NOTE — Progress Notes (Signed)
[name], female   DOB: 11-09-1976, 37 y.o.   MRN: [id number]  Reason for Appointment: Diabetes follow-up   History of Present Illness   Diagnosis: date of diagnosis: 2009, initially asymptomatic, has type 2 diabetes.   PAST history:  She was apparently diagnosed to have diabetes by her gynecologist when she was complaining of excessive fatigue. She does not know what her initial blood sugar was but her A1c was 6.8. Before she started on medications her blood sugars were as high as 200+ after meals. She was started on medications after seeing a couple of different physicians and initially did well with 500 mg twice a day. In 2012 her A1c was relatively higher at 6.2 and although sugars are fairly good she was told to take metformin twice a day. This improved her blood sugars and her A1c was 5.9 in 12/12.   RECENT history:  Because of higher readings especially in the morning and difficulty taking more than 1000 mg of Glucophage XR she was given Amaryl last week. She was told to continue her Janumet XR 100/1000 but she misunderstood and did not take this. Her blood sugars have increased further and she is concerned about them. She tried 1 mg Amaryl, half tablet first and then the full tablet   Side effects from medications: Diarrhea with metfomin, 2 g doses. She does take this with food  Monitors blood glucose: Occasionally.  Glucometer: Contour.  Blood Glucose readings: Before breakfast: Recently 136-181, nonfasting 163-207, lowest reading at night 112  Hypoglycemia tends to occur: None   Meals: 3 meals per day, vegetarian, tries to get protein, counts carbs.  Physical activity: exercise: Recently only once per week  Dietician visit: Most recent: 03/2013  Difficulties with medication adherence: None  The previous HbgA1c was 6.3% on 02/24/13 and 5.9 in 12/12.  Lab Results  Component Value Date   HGBA1C 7.1* 06/16/2013    HYPERLIPIDEMIA:  previously had LDL of 108, now  95  No visits with results within 1 Week(s) from this visit. Latest known visit with results is:  Admission on 08/21/2013, Discharged on 08/22/2013  Component Date Value Range Status  . Glucose-Capillary 08/21/2013 101* 70 - 99 mg/dL Final  . Color, Urine 40/08/2724 AMBER* YELLOW Final   BIOCHEMICALS MAY BE AFFECTED BY COLOR  . APPearance 08/21/2013 CLOUDY* CLEAR Final  . Specific Gravity, Urine 08/21/2013 1.033* 1.005 - 1.030 Final  . pH 08/21/2013 5.5  5.0 - 8.0 Final  . Glucose, UA 08/21/2013 NEGATIVE  NEGATIVE mg/dL Final  . Hgb urine dipstick 08/21/2013 LARGE* NEGATIVE Final  . Bilirubin Urine 08/21/2013 SMALL* NEGATIVE Final  . Ketones, ur 08/21/2013 NEGATIVE  NEGATIVE mg/dL Final  . Protein, ur 36/64/4034 30* NEGATIVE mg/dL Final  . Urobilinogen, UA 08/21/2013 1.0  0.0 - 1.0 mg/dL Final  . Nitrite 74/25/9563 NEGATIVE  NEGATIVE Final  . Leukocytes, UA 08/21/2013 NEGATIVE  NEGATIVE Final  . Preg Test, Ur 08/21/2013 NEGATIVE  NEGATIVE Final   Comment:                                 THE SENSITIVITY OF THIS                          METHODOLOGY IS >24 mIU/mL  . Squamous Epithelial / LPF 08/21/2013 RARE  RARE Final  . WBC, UA 08/21/2013 0-2  <3 WBC/hpf Final  .  RBC / HPF 08/21/2013 TOO NUMEROUS TO COUNT  <3 RBC/hpf Final  . Bacteria, UA 08/21/2013 FEW* RARE Final  . Crystals 08/21/2013 CA OXALATE CRYSTALS* NEGATIVE Final  . Urine-Other 08/21/2013 MUCOUS PRESENT   Final  . Sodium 08/21/2013 138  135 - 145 mEq/L Final  . Potassium 08/21/2013 4.4  3.5 - 5.1 mEq/L Final  . Chloride 08/21/2013 103  96 - 112 mEq/L Final  . BUN 08/21/2013 14  6 - 23 mg/dL Final  . Creatinine, Ser 08/21/2013 1.00  0.50 - 1.10 mg/dL Final  . Glucose, Bld 14/78/2956 159* 70 - 99 mg/dL Final  . Calcium, Ion 21/30/8657 1.17  1.12 - 1.23 mmol/L Final  . TCO2 08/21/2013 23  0 - 100 mmol/L Final  . Hemoglobin 08/21/2013 14.3  12.0 - 15.0 g/dL Final  . HCT 84/69/6295 42.0  36.0 - 46.0 % Final       Medication List       This list is accurate as of: 08/29/13 11:56 AM.  Always use your most recent med list.               BAYER CONTOUR NEXT TEST test strip  Generic drug:  glucose blood  daily.     EPINEPHrine 0.3 mg/0.3 mL Devi  Commonly known as:  EPIPEN  Inject 0.3 mLs (0.3 mg total) into the muscle once.     glimepiride 1 MG tablet  Commonly known as:  AMARYL  1 at bedtime     INTEGRA F PO  Take by mouth.     LOMEDIA 24 FE PO  Take by mouth daily.     multivitamin with minerals Tabs tablet  Take 1 tablet by mouth daily.     ondansetron 4 MG disintegrating tablet  Commonly known as:  ZOFRAN ODT  4mg  ODT q4 hours prn nausea/vomit     oxyCODONE-acetaminophen 5-325 MG per tablet  Commonly known as:  PERCOCET  Take 1 tablet by mouth every 6 (six) hours as needed for pain.     SitaGLIPtin-MetFORMIN HCl [phone] MG Tb24  Commonly known as:  JANUMET XR  Take 100-1,000 mg by mouth daily after supper.     tamsulosin 0.4 MG Caps capsule  Commonly known as:  FLOMAX  Take 1 capsule (0.4 mg total) by mouth daily after breakfast.        Allergies: No Known Allergies  Past Medical History  Diagnosis Date  . Diabetes mellitus   . Back pain   . PCOS (polycystic ovarian syndrome)   . Fibroids     Past Surgical History  Procedure Laterality Date  . Uterine fibroid surgery    . Breast surgery      benign    No family history on file.  Social History:  reports that she has never smoked. She does not have any smokeless tobacco history on file. She reports that she does not drink alcohol or use illicit drugs.  Review of Systems -  She is complaining of excessive urination especially at night  No history of hypertension  Normal lipids without medication  Recently had kidney stone  Has history of uterine fibroids   Examination:   BP 122/74  Pulse 103  Resp 12  Ht 5\' 5"  (1.651 m)  Wt 120 lb 4.8 oz (54.568 kg)  BMI 20.02 kg/m2  SpO2 96%  LMP  08/23/2013  Body mass index is 20.02 kg/(m^2).   Assesment:   DIABETES type 2:  The patient's diabetes control was fair with  Janumet with relatively higher fasting readings. Since she misunderstood the directions and stopped the Janumet with starting Amaryl her blood sugars are high Advised her to go back on the Janumet XR Also she is willing to try additional 500 mg of Glumetza with her Janumet with a sample. She is able to tolerate this may be able to taper off her Amaryl. She will continue to monitor readings at various times  Problem #2: Frequent urination: Will check urinalysis since she does not have hyperglycemia at the level cause polyuria  Natalyn Szymanowski 08/29/2013, 11:56 AM

## 2013-08-29 NOTE — Patient Instructions (Addendum)
Janumet same as before, add Glumetza 500 and Glimeperide 1mg  at dinner  If sugars < 100 at night or <80 in am reduce Glimeperide to 1/2

## 2013-08-30 NOTE — Progress Notes (Signed)
Quick Note:  Please let patient know that the lab result is normal and no further action needed ______ 

## 2013-10-13 ENCOUNTER — Other Ambulatory Visit: Payer: Self-pay | Admitting: *Deleted

## 2013-10-13 MED ORDER — METFORMIN HCL ER (MOD) 500 MG PO TB24: 500.0000 mg | ORAL_TABLET | Freq: Every day | ORAL | Status: DC

## 2013-10-25 ENCOUNTER — Ambulatory Visit (INDEPENDENT_AMBULATORY_CARE_PROVIDER_SITE_OTHER): Payer: BC Managed Care – PPO | Admitting: Endocrinology

## 2013-10-25 ENCOUNTER — Encounter: Payer: Self-pay | Admitting: Endocrinology

## 2013-10-25 VITALS — BP 112/60 | HR 72 | Temp 98.3°F | Resp 12 | Ht 65.0 in | Wt 125.8 lb

## 2013-10-25 DIAGNOSIS — IMO0001 Reserved for inherently not codable concepts without codable children: Secondary | ICD-10-CM

## 2013-10-25 DIAGNOSIS — R5381 Other malaise: Secondary | ICD-10-CM

## 2013-10-25 LAB — COMPREHENSIVE METABOLIC PANEL
ALT: 16 U/L (ref 0–35)
Albumin: 3.9 g/dL (ref 3.5–5.2)
Alkaline Phosphatase: 39 U/L (ref 39–117)
CO2: 25 mEq/L (ref 19–32)
Calcium: 9.3 mg/dL (ref 8.4–10.5)
Glucose, Bld: 125 mg/dL — ABNORMAL HIGH (ref 70–99)
Potassium: 4.6 mEq/L (ref 3.5–5.1)
Sodium: 138 mEq/L (ref 135–145)
Total Bilirubin: 0.6 mg/dL (ref 0.3–1.2)
Total Protein: 7.2 g/dL (ref 6.0–8.3)

## 2013-10-25 LAB — TSH: TSH: 2.08 u[IU]/mL (ref 0.35–5.50)

## 2013-10-25 LAB — HEMOGLOBIN A1C: Hgb A1c MFr Bld: 6.8 % — ABNORMAL HIGH (ref 4.6–6.5)

## 2013-10-25 NOTE — Patient Instructions (Signed)
More sugars after supper 

## 2013-10-25 NOTE — Progress Notes (Signed)
[name], female   DOB: 12/06/1975, 37 y.o.   MRN: [id number]  Reason for Appointment: Diabetes follow-up   History of Present Illness   Diagnosis: date of diagnosis: 2009, initially asymptomatic, has type 2 diabetes.   PAST history:  She was apparently diagnosed to have diabetes by her gynecologist when she was complaining of excessive fatigue. She does not know what her initial blood sugar was but her A1c was 6.8. Before she started on medications her blood sugars were as high as 200+ after meals. She was started on medications after seeing a couple of different physicians and initially did well with 500 mg twice a day. In 2012 her A1c was relatively higher at 6.2 and although sugars are fairly good she was told to take metformin twice a day. This improved her blood sugars and her A1c was 5.9 in 12/12.   RECENT history:  Because of higher readings especially in the morning and difficulty taking more than 1000 mg of Glucophage XR she was started on Amaryl 1 mg at bedtime. Also she was started on Glumetza 500 mg in addition to her Janumet and she is tolerating this.  Her blood sugars have improved significantly with this regimen although still high normal in the morning She is not checking readings after meals much and her only high reading last night was related to poor diet with eating pizza and brownies  Side effects from medications: Diarrhea with metfomin, 2 g doses. Monitors blood glucose: About once a day  Glucometer: Contour.  Blood Glucose readings:   PREMEAL Breakfast Lunch Dinner Bedtime Overall  Glucose range:  110-154   63-152   69   124, 202    Mean/median:  140      126    Hypoglycemia tends to occur: Before lunch or supper, fairly infrequently and better if she has a small snack in between meals   Meals: 3 meals per day, vegetarian, tries to get protein, counts carbs.  Physical activity: exercise: Swimming once per week; Walking once a week   Dietician  visit: Most recent: 03/2013  Difficulties with medication adherence: None Wt Readings from Last 3 Encounters:  10/25/13 125 lb 12.8 oz (57.063 kg)  08/29/13 120 lb 4.8 oz (54.568 kg)  08/25/13 127 lb 3.2 oz (57.698 kg)   The previous HbgA1c was 6.3% on 02/24/13 and 5.9 in 12/12.  Lab Results  Component Value Date   HGBA1C 7.1* 06/16/2013    HYPERLIPIDEMIA:  previously had LDL of 108, now 95  No visits with results within 1 Week(s) from this visit. Latest known visit with results is:  Office Visit on 08/29/2013  Component Date Value Range Status  . Color, Urine 08/29/2013 LT. YELLOW  Yellow;Lt. Yellow Final  . APPearance 08/29/2013 CLEAR  Clear Final  . Specific Gravity, Urine 08/29/2013 1.020  1.000-1.030 Final  . pH 08/29/2013 7.0  5.0 - 8.0 Final  . Total Protein, Urine 08/29/2013 NEGATIVE  Negative Final  . Urine Glucose 08/29/2013 NEGATIVE  Negative Final  . Ketones, ur 08/29/2013 40  Negative Final  . Bilirubin Urine 08/29/2013 NEGATIVE  Negative Final  . Hgb urine dipstick 08/29/2013 TRACE-INTACT  Negative Final  . Urobilinogen, UA 08/29/2013 0.2  0.0 - 1.0 Final  . Leukocytes, UA 08/29/2013 TRACE  Negative Final  . Nitrite 08/29/2013 NEGATIVE  Negative Final  . WBC, UA 08/29/2013 3-6/hpf  0-2/hpf Final  . RBC / HPF 08/29/2013 0-2/hpf  0-2/hpf Final  . Mucus, UA 08/29/2013 Presence  of  None Final  . Squamous Epithelial / LPF 08/29/2013 Few(5-10/hpf)  Rare(0-4/hpf) Final      Medication List       This list is accurate as of: 10/25/13  8:34 AM.  Always use your most recent med list.               BAYER CONTOUR NEXT TEST test strip  Generic drug:  glucose blood  daily.     EPINEPHrine 0.3 mg/0.3 mL Devi  Commonly known as:  EPIPEN  Inject 0.3 mLs (0.3 mg total) into the muscle once.     escitalopram 10 MG tablet  Commonly known as:  LEXAPRO     glimepiride 1 MG tablet  Commonly known as:  AMARYL  1 at bedtime     INTEGRA F PO  Take by mouth.      LOMEDIA 24 FE PO  Take by mouth daily.     metFORMIN 500 MG (MOD) 24 hr tablet  Commonly known as:  GLUMETZA  Take 1 tablet (500 mg total) by mouth daily with breakfast.     multivitamin with minerals Tabs tablet  Take 1 tablet by mouth daily.     SitaGLIPtin-MetFORMIN HCl [phone] MG Tb24  Commonly known as:  JANUMET XR  Take 100-1,000 mg by mouth daily after supper.     vitamin B-12 1000 MCG tablet  Commonly known as:  CYANOCOBALAMIN  Take 1,000 mcg by mouth daily.        Allergies: No Known Allergies  Past Medical History  Diagnosis Date  . Diabetes mellitus   . Back pain   . PCOS (polycystic ovarian syndrome)   . Fibroids     Past Surgical History  Procedure Laterality Date  . Uterine fibroid surgery    . Breast surgery      benign    No family history on file.  Social History:  reports that she has never smoked. She does not have any smokeless tobacco history on file. She reports that she does not drink alcohol or use illicit drugs.  Review of Systems -  No history of hypertension  Normal lipids without medication  Being evaluated for multiple kidney stones  Has history of uterine fibroids, periods are regular but rarely 2 cycles per month    Examination:   BP 112/60  Pulse 72  Temp(Src) 98.3 F (36.8 C)  Resp 12  Ht 5\' 5"  (1.651 m)  Wt 125 lb 12.8 oz (57.063 kg)  BMI 20.93 kg/m2  SpO2 98%  LMP 10/11/2013  Body mass index is 20.93 kg/(m^2).   Assesment:   DIABETES type 2:  The patient's diabetes control appears to be improving with a total dose of 1500 mg metformin in the form of Glumetza 500 and Janumet XR Also has benefited from low dose Amaryl at bedtime with improved fasting readings She did have mild hypoglycemia initially at lunch or supper but not now She has gained a little weight from better blood sugars but she is not concerned about this She does need to exercise more and she understands this. She will need to monitor readings  at various times including after meals   Mia Wilson 10/25/2013, 8:34 AM

## 2013-11-08 ENCOUNTER — Encounter: Payer: BC Managed Care – PPO | Attending: Endocrinology | Admitting: Dietician

## 2013-11-08 ENCOUNTER — Encounter: Payer: Self-pay | Admitting: Dietician

## 2013-11-08 VITALS — Ht 66.0 in | Wt 127.0 lb

## 2013-11-08 DIAGNOSIS — IMO0001 Reserved for inherently not codable concepts without codable children: Secondary | ICD-10-CM

## 2013-11-08 DIAGNOSIS — Z713 Dietary counseling and surveillance: Secondary | ICD-10-CM | POA: Insufficient documentation

## 2013-11-08 NOTE — Progress Notes (Signed)
Patient was seen on 11/08/13 for the first of a series of three diabetes self-management courses at the [facility].  Current HbA1c: 6.8% on 10/25/13  The following learning objectives were met by the patient during this class:  Describe diabetes  State some common risk factors for diabetes  Defines the role of glucose and insulin  Identifies type of diabetes and pathophysiology  Describe the relationship between diabetes and cardiovascular risk  State the members of the Healthcare Team  States the rationale for glucose monitoring  State when to test glucose  State their individual Target Range  State the importance of logging glucose readings  Describe how to interpret glucose readings  Identifies A1C target  Explain the correlation between A1c and eAG values  State symptoms and treatment of high blood glucose  State symptoms and treatment of low blood glucose  Explain proper technique for glucose testing  Identifies proper sharps disposal  Handouts given during class include:  Living Well with Diabetes book  Carb Counting and Meal Planning book  Meal Plan Card  Carbohydrate guide  Meal planning worksheet  Low Sodium Flavoring Tips  The diabetes portion plate  Low Carbohydrate Snack Suggestions  A1c to eAG Conversion Chart  Diabetes Medications  Stress Management  Diabetes Recommended Care Schedule  Diabetes Success Plan  Core Class Satisfaction Survey  Your patient has identified their diabetes care support plan as:  [facility]  Staff  Follow-Up Plan:  Attend core 2

## 2013-11-08 NOTE — Patient Instructions (Signed)
Goals:  Monitor glucose levels as instructed by your doctor  Bring food record and glucose log to your next nutrition visit 

## 2013-11-14 DIAGNOSIS — N924 Excessive bleeding in the premenopausal period: Secondary | ICD-10-CM | POA: Insufficient documentation

## 2013-11-14 DIAGNOSIS — D649 Anemia, unspecified: Secondary | ICD-10-CM | POA: Insufficient documentation

## 2013-11-20 DIAGNOSIS — E119 Type 2 diabetes mellitus without complications: Secondary | ICD-10-CM | POA: Insufficient documentation

## 2013-11-21 ENCOUNTER — Ambulatory Visit: Payer: BC Managed Care – PPO

## 2013-11-28 ENCOUNTER — Encounter: Payer: BC Managed Care – PPO | Attending: Endocrinology

## 2013-11-28 DIAGNOSIS — Z713 Dietary counseling and surveillance: Secondary | ICD-10-CM | POA: Insufficient documentation

## 2013-11-28 DIAGNOSIS — E1165 Type 2 diabetes mellitus with hyperglycemia: Principal | ICD-10-CM

## 2013-11-28 DIAGNOSIS — IMO0001 Reserved for inherently not codable concepts without codable children: Secondary | ICD-10-CM | POA: Insufficient documentation

## 2013-11-29 NOTE — Progress Notes (Signed)

## 2013-12-05 DIAGNOSIS — E1165 Type 2 diabetes mellitus with hyperglycemia: Principal | ICD-10-CM

## 2013-12-05 DIAGNOSIS — IMO0001 Reserved for inherently not codable concepts without codable children: Secondary | ICD-10-CM

## 2013-12-05 NOTE — Progress Notes (Signed)
Patient was seen on 12/05/13 for the third of a series of three diabetes self-management courses at the [facility]. The following learning objectives were met by the patient during this class:    State the amount of activity recommended for healthy living   Describe activities suitable for individual needs   Identify ways to regularly incorporate activity into daily life   Identify barriers to activity and ways to over come these barriers  Identify diabetes medications being personally used and their primary action for lowering glucose and possible side effects   Describe role of stress on blood glucose and develop strategies to address psychosocial issues   Identify diabetes complications and ways to prevent them  Explain how to manage diabetes during illness   Evaluate success in meeting personal goal   Establish 2-3 goals that they will plan to diligently work on until they return for the  56-monthfollow-up visit  Goals:  Follow Diabetes Meal Plan as instructed  Aim for 15-30 mins of physical activity daily as tolerated  Bring food record and glucose log to your follow up visit  Your patient has established the following 4 month goals in their individualized success plan: I will increase my activity level at least 3 days a week I will test my glucose at least 2 times a day, 7 days a week  Your patient has identified these potential barriers to change:  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

## 2013-12-25 ENCOUNTER — Other Ambulatory Visit: Payer: Self-pay | Admitting: Physician Assistant

## 2013-12-25 ENCOUNTER — Ambulatory Visit
Admission: RE | Admit: 2013-12-25 | Discharge: 2013-12-25 | Disposition: A | Discharge status: Home / Self Care | Payer: BC Managed Care – PPO | Source: Ambulatory Visit | Attending: Physician Assistant | Admitting: Physician Assistant

## 2013-12-25 DIAGNOSIS — S6992XA Unspecified injury of left wrist, hand and finger(s), initial encounter: Secondary | ICD-10-CM

## 2013-12-27 ENCOUNTER — Other Ambulatory Visit: Payer: Self-pay | Admitting: Endocrinology

## 2014-01-22 ENCOUNTER — Other Ambulatory Visit (INDEPENDENT_AMBULATORY_CARE_PROVIDER_SITE_OTHER): Payer: BC Managed Care – PPO

## 2014-01-22 DIAGNOSIS — IMO0001 Reserved for inherently not codable concepts without codable children: Secondary | ICD-10-CM

## 2014-01-22 DIAGNOSIS — E1165 Type 2 diabetes mellitus with hyperglycemia: Principal | ICD-10-CM

## 2014-01-22 LAB — BASIC METABOLIC PANEL
BUN: 12 mg/dL (ref 6–23)
CALCIUM: 9.2 mg/dL (ref 8.4–10.5)
CHLORIDE: 106 meq/L (ref 96–112)
CO2: 24 mEq/L (ref 19–32)
CREATININE: 0.8 mg/dL (ref 0.4–1.2)
GFR: 100.92 mL/min (ref >60.00)
GLUCOSE: 108 mg/dL — AB (ref 70–99)
Potassium: 4.9 mEq/L (ref 3.5–5.1)
Sodium: 140 mEq/L (ref 135–145)

## 2014-01-22 LAB — HEMOGLOBIN A1C: HEMOGLOBIN A1C: 6.6 % — AB (ref 4.6–6.5)

## 2014-01-25 ENCOUNTER — Ambulatory Visit (INDEPENDENT_AMBULATORY_CARE_PROVIDER_SITE_OTHER): Payer: BC Managed Care – PPO | Admitting: Endocrinology

## 2014-01-25 ENCOUNTER — Encounter: Payer: Self-pay | Admitting: Endocrinology

## 2014-01-25 VITALS — BP 110/68 | HR 73 | Temp 97.8°F | Resp 16 | Ht 65.0 in | Wt 127.4 lb

## 2014-01-25 DIAGNOSIS — IMO0001 Reserved for inherently not codable concepts without codable children: Secondary | ICD-10-CM

## 2014-01-25 DIAGNOSIS — E1165 Type 2 diabetes mellitus with hyperglycemia: Principal | ICD-10-CM

## 2014-01-25 NOTE — Patient Instructions (Signed)
More exercise, add protein in ams

## 2014-01-25 NOTE — Progress Notes (Signed)
[name], female   DOB: 02/21/1976, 38 y.o.   MRN: [id number]    Reason for Appointment: Diabetes follow-up   History of Present Illness   Diagnosis: date of diagnosis: 2009, initially asymptomatic, has type 2 diabetes.   PAST history:  She was apparently diagnosed to have diabetes by her gynecologist when she was complaining of excessive fatigue. She does not know what her initial blood sugar was but her A1c was 6.8. Before she started on medications her blood sugars were as high as 200+ after meals. She was started on medications after seeing a couple of different physicians and initially did well with 500 mg twice a day. In 2012 her A1c was relatively higher at 6.2 and although sugars are fairly good she was told to take metformin twice a day. This improved her blood sugars and her A1c was 5.9 in 12/12.   RECENT history:  Because of higher readings especially in the morning and difficulty taking more than 1000 mg of Glucophage XR she was started on Amaryl 1 mg at bedtime. Also she was started on Glumetza 500 mg in addition to her Janumet XR and she has no GI side effects with this  Her blood sugars are overall fairly good with this regimen of medications although still slightly high in the morning She is not checking readings after meals as directed again Her occasional high readings at night are from overeating  Side effects from medications: Diarrhea with metfomin, 2 g doses. Monitors blood glucose: About once a day  Glucometer: Contour.  Blood Glucose readings checked mostly in the mornings from about 6 AM-11 AM:   PREMEAL Breakfast Lunch Dinner Bedtime Overall  Glucose range:  116-175    72   202    Mean/median:  138      141   Hypoglycemia tends to occur: Before lunch occasionally with symptoms   Meals: 3 meals per day, vegetarian, tries to get protein like nuts, yogurt, soy, fish, counts carbs. Occasionally will have only oatmeal at breakfast  Physical  activity: exercise: Swimming once per week; Walking once a week   Dietician visit: Most recent: 03/2013  Difficulties with medication adherence: None  Wt Readings from Last 3 Encounters:  01/25/14 127 lb 6.4 oz (57.788 kg)  11/08/13 127 lb (57.607 kg)  10/25/13 125 lb 12.8 oz (57.063 kg)   The previous HbgA1c was 6.3% on 02/24/13 and 5.9 in 12/12.  Lab Results  Component Value Date   HGBA1C 6.6* 01/22/2014   HGBA1C 6.8* 10/25/2013   HGBA1C 7.1* 06/16/2013   Lab Results  Component Value Date   MICROALBUR 1.8 10/25/2013   LDLCALC 95 06/16/2013   CREATININE 0.8 01/22/2014     HYPERLIPIDEMIA:  previously had LDL of 108, now 95  Appointment on 01/22/2014  Component Date Value Ref Range Status  . Hemoglobin A1C 01/22/2014 6.6* 4.6 - 6.5 % Final   Glycemic Control Guidelines for People with Diabetes:Non Diabetic:  <6%Goal of Therapy: <7%Additional Action Suggested:  >8%   . Sodium 01/22/2014 140  135 - 145 mEq/L Final  . Potassium 01/22/2014 4.9  3.5 - 5.1 mEq/L Final  . Chloride 01/22/2014 106  96 - 112 mEq/L Final  . CO2 01/22/2014 24  19 - 32 mEq/L Final  . Glucose, Bld 01/22/2014 108* 70 - 99 mg/dL Final  . BUN 01/22/2014 12  6 - 23 mg/dL Final  . Creatinine, Ser 01/22/2014 0.8  0.4 - 1.2 mg/dL Final  . Calcium 01/22/2014  9.2  8.4 - 10.5 mg/dL Final  . GFR 01/22/2014 100.92  >60.00 mL/min Final      Medication List       This list is accurate as of: 01/25/14  8:49 AM.  Always use your most recent med list.               BAYER CONTOUR NEXT TEST test strip  Generic drug:  glucose blood  daily.     EPINEPHrine 0.3 mg/0.3 mL Devi  Commonly known as:  EPIPEN  Inject 0.3 mLs (0.3 mg total) into the muscle once.     escitalopram 10 MG tablet  Commonly known as:  LEXAPRO     glimepiride 1 MG tablet  Commonly known as:  AMARYL  TAKE ONE TABLET BY MOUTH AT BEDTIME     INTEGRA F PO  Take by mouth.     LOMEDIA 24 FE PO  Take by mouth daily.     metFORMIN 500 MG (MOD) 24  hr tablet  Commonly known as:  GLUMETZA  Take 1 tablet (500 mg total) by mouth daily with breakfast.     multivitamin with minerals Tabs tablet  Take 1 tablet by mouth daily.     NASONEX 50 MCG/ACT nasal spray  Generic drug:  mometasone     SitaGLIPtin-MetFORMIN HCl [phone] MG Tb24  Commonly known as:  JANUMET XR  Take 100-1,000 mg by mouth daily after supper.     tacrolimus 0.1 % ointment  Commonly known as:  PROTOPIC     vitamin B-12 1000 MCG tablet  Commonly known as:  CYANOCOBALAMIN  Take 1,000 mcg by mouth daily.        Allergies:  Allergies  Allergen Reactions  . Strawberry   . Tomato     Past Medical History  Diagnosis Date  . Diabetes mellitus   . Back pain   . PCOS (polycystic ovarian syndrome)   . Fibroids     Past Surgical History  Procedure Laterality Date  . Uterine fibroid surgery    . Breast surgery      benign    Family History  Problem Relation Age of Onset  . Hyperlipidemia Other   . Hypertension Other   . Stroke Other   . Diabetes Other   . Cancer Other     Social History:  reports that she has never smoked. She does not have any smokeless tobacco history on file. She reports that she does not drink alcohol or use illicit drugs.  Review of Systems -  No history of hypertension  Normal lipids without statin drugs  Has history of uterine fibroids, periods are regular on BCP, apparently has history of PCOS.    Examination:   BP 110/68  Pulse 73  Temp(Src) 97.8 F (36.6 C)  Resp 16  Ht 5\' 5"  (1.651 m)  Wt 127 lb 6.4 oz (57.788 kg)  BMI 21.20 kg/m2  SpO2 97%  Body mass index is 21.2 kg/(m^2).   Assesment:   DIABETES type 2:  The patient's diabetes control is fairly good with a total dose of 1500 mg metformin in the form of Glumetza 500 and Janumet XR A1c is reasonably good at 6.6% and slightly better than before Also has benefited from  1 mg Amaryl at bedtime with improved fasting readings although these are still  relatively high She does have mild hypoglycemia occasionally although not frequently  She does need to exercise more and she will try to do so She will  need to monitor readings after meals which he is not doing Also discussed adding protein to her breakfast consistently in the form of peanut butter on various kinds of cheeses. Also can add a midmorning snack regularly   Joelynn Dust 01/25/2014, 8:49 AM

## 2014-02-26 ENCOUNTER — Ambulatory Visit: Payer: BC Managed Care – PPO

## 2014-02-26 ENCOUNTER — Other Ambulatory Visit: Payer: Self-pay | Admitting: *Deleted

## 2014-02-26 DIAGNOSIS — E1165 Type 2 diabetes mellitus with hyperglycemia: Principal | ICD-10-CM

## 2014-02-26 DIAGNOSIS — IMO0001 Reserved for inherently not codable concepts without codable children: Secondary | ICD-10-CM

## 2014-02-26 MED ORDER — SITAGLIP PHOS-METFORMIN HCL ER 100-1000 MG PO TB24
100.0000 mg | ORAL_TABLET | Freq: Every day | ORAL | Status: DC
Start: 2014-02-26 — End: 2014-09-17

## 2014-05-24 ENCOUNTER — Other Ambulatory Visit (INDEPENDENT_AMBULATORY_CARE_PROVIDER_SITE_OTHER): Payer: BC Managed Care – PPO

## 2014-05-24 DIAGNOSIS — E1165 Type 2 diabetes mellitus with hyperglycemia: Principal | ICD-10-CM

## 2014-05-24 DIAGNOSIS — IMO0001 Reserved for inherently not codable concepts without codable children: Secondary | ICD-10-CM

## 2014-05-24 LAB — COMPREHENSIVE METABOLIC PANEL
ALK PHOS: 42 U/L (ref 39–117)
ALT: 17 U/L (ref 0–35)
AST: 22 U/L (ref 0–37)
Albumin: 3.6 g/dL (ref 3.5–5.2)
BUN: 10 mg/dL (ref 6–23)
CO2: 26 meq/L (ref 19–32)
CREATININE: 0.7 mg/dL (ref 0.4–1.2)
Calcium: 9 mg/dL (ref 8.4–10.5)
Chloride: 103 mEq/L (ref 96–112)
GFR: 123.28 mL/min (ref >60.00)
GLUCOSE: 138 mg/dL — AB (ref 70–99)
Potassium: 4.3 mEq/L (ref 3.5–5.1)
Sodium: 136 mEq/L (ref 135–145)
TOTAL PROTEIN: 6.8 g/dL (ref 6.0–8.3)
Total Bilirubin: 0.7 mg/dL (ref 0.2–1.2)

## 2014-05-24 LAB — LIPID PANEL
CHOLESTEROL: 191 mg/dL (ref 0–200)
HDL: 83.9 mg/dL (ref >39.00)
LDL Cholesterol: 95 mg/dL (ref 0–99)
NonHDL: 107.1
Total CHOL/HDL Ratio: 2
Triglycerides: 60 mg/dL (ref 0.0–149.0)
VLDL: 12 mg/dL (ref 0.0–40.0)

## 2014-05-24 LAB — MICROALBUMIN / CREATININE URINE RATIO
Creatinine,U: 198.8 mg/dL
MICROALB UR: 0.5 mg/dL (ref 0.0–1.9)
Microalb Creat Ratio: 0.3 mg/g (ref 0.0–30.0)

## 2014-05-24 LAB — HEMOGLOBIN A1C: Hgb A1c MFr Bld: 7.1 % — ABNORMAL HIGH (ref 4.6–6.5)

## 2014-05-28 ENCOUNTER — Encounter: Payer: BC Managed Care – PPO | Attending: Physician Assistant

## 2014-05-28 ENCOUNTER — Ambulatory Visit: Payer: BC Managed Care – PPO | Admitting: Endocrinology

## 2014-05-28 DIAGNOSIS — IMO0001 Reserved for inherently not codable concepts without codable children: Secondary | ICD-10-CM

## 2014-05-28 DIAGNOSIS — E1165 Type 2 diabetes mellitus with hyperglycemia: Secondary | ICD-10-CM

## 2014-05-28 DIAGNOSIS — E119 Type 2 diabetes mellitus without complications: Secondary | ICD-10-CM | POA: Insufficient documentation

## 2014-05-28 DIAGNOSIS — Z713 Dietary counseling and surveillance: Secondary | ICD-10-CM | POA: Diagnosis not present

## 2014-05-29 NOTE — Progress Notes (Signed)
Patient was seen on 05/28/2014 for a review of the series of three diabetes self-management courses at the [facility]. The following learning objectives were met by the patient during this class:    Reviewed blood glucose monitoring and interpretation including the recommended target ranges and Hgb A1c.    Reviewed on carb counting, importance of regularly scheduled meals/snacks, and meal planning.    Reviewed the effects of physical activity on glucose levels and long-term glucose control.  Recommended goal of 150 minutes of physical activity/week.   Reviewed patient medications and discussed role of medication on blood glucose and possible side effects.   Discussed strategies to manage stress, psychosocial issues, and other obstacles to diabetes management.   Encouraged moderate weight reduction to improve glucose levels.     Reviewed short-term complications: hyper- and hypo-glycemia.  Discussed causes, symptoms, and treatment options.   Reviewed prevention, detection, and treatment of long-term complications.  Discussed the role of prolonged elevated glucose levels on body systems.  Goals:  Follow Diabetes Meal Plan as instructed  Eat 3 meals and 2 snacks, every 3-5 hrs  Limit carbohydrate intake to 45 grams carbohydrate/meal Limit carbohydrate intake to 15 grams carbohydrate/snack Add lean protein foods to meals/snacks  Monitor glucose levels as instructed by your doctor  Aim for goal of 15-30 mins of physical activity daily as tolerated  Bring food record and glucose log to your next nutrition visit

## 2014-06-06 ENCOUNTER — Ambulatory Visit: Payer: BC Managed Care – PPO | Admitting: Endocrinology

## 2014-06-11 ENCOUNTER — Other Ambulatory Visit: Payer: Self-pay | Admitting: Endocrinology

## 2014-06-29 ENCOUNTER — Encounter (INDEPENDENT_AMBULATORY_CARE_PROVIDER_SITE_OTHER): Payer: BC Managed Care – PPO | Admitting: Ophthalmology

## 2014-06-29 DIAGNOSIS — H43819 Vitreous degeneration, unspecified eye: Secondary | ICD-10-CM

## 2014-06-29 DIAGNOSIS — E1039 Type 1 diabetes mellitus with other diabetic ophthalmic complication: Secondary | ICD-10-CM

## 2014-06-29 DIAGNOSIS — E11319 Type 2 diabetes mellitus with unspecified diabetic retinopathy without macular edema: Secondary | ICD-10-CM

## 2014-06-29 DIAGNOSIS — E1065 Type 1 diabetes mellitus with hyperglycemia: Secondary | ICD-10-CM

## 2014-07-02 ENCOUNTER — Ambulatory Visit (INDEPENDENT_AMBULATORY_CARE_PROVIDER_SITE_OTHER): Payer: BC Managed Care – PPO | Admitting: Endocrinology

## 2014-07-02 ENCOUNTER — Encounter: Payer: Self-pay | Admitting: Endocrinology

## 2014-07-02 VITALS — BP 124/71 | HR 85 | Temp 98.0°F | Resp 16 | Ht 65.0 in | Wt 132.6 lb

## 2014-07-02 DIAGNOSIS — IMO0001 Reserved for inherently not codable concepts without codable children: Secondary | ICD-10-CM

## 2014-07-02 DIAGNOSIS — E1165 Type 2 diabetes mellitus with hyperglycemia: Principal | ICD-10-CM

## 2014-07-02 NOTE — Patient Instructions (Signed)
Please check blood sugars at least half the time about 2 hours after any meal and times per week on waking up.  Please bring blood sugar monitor to each visit Exercise

## 2014-07-02 NOTE — Progress Notes (Signed)
[name], female   DOB: 1976/11/12, 38 y.o.   MRN: [id number]    Reason for Appointment: Diabetes follow-up   History of Present Illness   Diagnosis: date of diagnosis: 2009, initially asymptomatic, has type 2 diabetes.   PAST history:  She was apparently diagnosed to have diabetes by her gynecologist when she was complaining of excessive fatigue. She does not know what her initial blood sugar was but her A1c was 6.8. Before she started on medications her blood sugars were as high as 200+ after meals. She was started on medications after seeing a couple of different physicians and initially did well with 500 mg twice a day. In 2012 her A1c was relatively higher at 6.2 and although sugars are fairly good she was told to take metformin twice a day. This improved her blood sugars and her A1c was 5.9 in 12/12.   RECENT history:  She had initially been managed with Janumet XR 100/1000 daily. She tends to have intolerance to metformin or high doses of metformin ER Also she was tried on Glumetza 500 mg in addition to her Janumet XR and she has no GI side effects with this Because of higher readings especially in the morning and difficulty taking more than 1000 mg of Glucophage XR she was has been on Amaryl 1 mg at bedtime.   Her blood sugars are relatively high in the morning still although she is checking only a few readings later in the day Also her A1c in 7/15 was higher than usual She thinks this is from not watching her diet especially in the evenings and sometimes getting fast food late at night. She thinks that the last few days and she has been eating earlier in the evening her blood sugars are better overnight She also has not been able to find time to exercise at all  Side effects from medications: Diarrhea with metfomin, 2 g doses. Monitors blood glucose: About once a day  Glucometer: Contour.  Blood Glucose readings checked mostly in the mornings from about 6 AM-11 AM:    PREMEAL Breakfast Lunch Dinner Bedtime Overall  Glucose range:  110-189    80  145, 200   Mean/median:  144      141    POST-MEAL PC Breakfast PC Lunch PC Dinner  Glucose range:  112-159   101    Mean/median:       Hypoglycemia: None recently   Meals: 3 meals per day, vegetarian, tries to get protein like nuts, [name] yogurt, soy, fish, counts carbs.  Occasionally will have oatmeal at breakfast with some dairy product  Physical activity: exercise: none  Dietician visit: Most recent: 03/2013  Difficulties with medication adherence: None  Wt Readings from Last 3 Encounters:  07/02/14 132 lb 9.6 oz (60.147 kg)  01/25/14 127 lb 6.4 oz (57.788 kg)  11/08/13 127 lb (57.607 kg)   The previous HbgA1c was 6.3% on 02/24/13 and 5.9 in 12/12.  Lab Results  Component Value Date   HGBA1C 7.1* 05/24/2014   HGBA1C 6.6* 01/22/2014   HGBA1C 6.8* 10/25/2013   Lab Results  Component Value Date   MICROALBUR 0.5 05/24/2014   LDLCALC 95 05/24/2014   CREATININE 0.7 05/24/2014     HYPERLIPIDEMIA:  previously had LDL of 108, now 95  No visits with results within 1 Week(s) from this visit. Latest known visit with results is:  Appointment on 05/24/2014  Component Date Value Ref Range Status  . Hemoglobin A1C 05/24/2014 7.1*  4.6 - 6.5 % Final   Glycemic Control Guidelines for People with Diabetes:Non Diabetic:  <6%Goal of Therapy: <7%Additional Action Suggested:  >8%   . Sodium 05/24/2014 136  135 - 145 mEq/L Final  . Potassium 05/24/2014 4.3  3.5 - 5.1 mEq/L Final  . Chloride 05/24/2014 103  96 - 112 mEq/L Final  . CO2 05/24/2014 26  19 - 32 mEq/L Final  . Glucose, Bld 05/24/2014 138* 70 - 99 mg/dL Final  . BUN 05/24/2014 10  6 - 23 mg/dL Final  . Creatinine, Ser 05/24/2014 0.7  0.4 - 1.2 mg/dL Final  . Total Bilirubin 05/24/2014 0.7  0.2 - 1.2 mg/dL Final  . Alkaline Phosphatase 05/24/2014 42  39 - 117 U/L Final  . AST 05/24/2014 22  0 - 37 U/L Final  . ALT 05/24/2014 17  0 - 35 U/L Final  .  Total Protein 05/24/2014 6.8  6.0 - 8.3 g/dL Final  . Albumin 05/24/2014 3.6  3.5 - 5.2 g/dL Final  . Calcium 05/24/2014 9.0  8.4 - 10.5 mg/dL Final  . GFR 05/24/2014 123.28  >60.00 mL/min Final  . Cholesterol 05/24/2014 191  0 - 200 mg/dL Final   ATP III Classification       Desirable:  < 200 mg/dL               Borderline High:  200 - 239 mg/dL          High:  > = 240 mg/dL  . Triglycerides 05/24/2014 60.0  0.0 - 149.0 mg/dL Final   Normal:  <150 mg/dLBorderline High:  150 - 199 mg/dL  . HDL 05/24/2014 83.90  >39.00 mg/dL Final  . VLDL 05/24/2014 12.0  0.0 - 40.0 mg/dL Final  . LDL Cholesterol 05/24/2014 95  0 - 99 mg/dL Final  . Total CHOL/HDL Ratio 05/24/2014 2   Final                  Men          Women1/2 Average Risk     3.4          3.3Average Risk          5.0          4.42X Average Risk          9.6          7.13X Average Risk          15.0          11.0                      . NonHDL 05/24/2014 107.10   Final  . Microalb, Ur 05/24/2014 0.5  0.0 - 1.9 mg/dL Final  . Creatinine,U 05/24/2014 198.8   Final  . Microalb Creat Ratio 05/24/2014 0.3  0.0 - 30.0 mg/g Final      Medication List       This list is accurate as of: 07/02/14  3:18 PM.  Always use your most recent med list.               BAYER CONTOUR NEXT TEST test strip  Generic drug:  glucose blood  daily.     EPINEPHrine 0.3 mg/0.3 mL Devi  Commonly known as:  EPIPEN  Inject 0.3 mLs (0.3 mg total) into the muscle once.     escitalopram 10 MG tablet  Commonly known as:  LEXAPRO     glimepiride 1 MG tablet  Commonly known as:  AMARYL  TAKE ONE TABLET BY MOUTH AT BEDTIME     GLUMETZA 500 MG (MOD) 24 hr tablet  Generic drug:  metFORMIN  TAKE ONE TABLET BY MOUTH ONCE DAILY WITH BREAKFAST     INTEGRA F PO  Take by mouth.     LOMEDIA 24 FE PO  Take by mouth daily.     multivitamin with minerals Tabs tablet  Take 1 tablet by mouth daily.     SitaGLIPtin-MetFORMIN HCl [phone] MG Tb24  Commonly known as:   JANUMET XR  Take 100-1,000 mg by mouth daily after supper.     STRATTERA PO  Take by mouth. Unsure of dose     tacrolimus 0.1 % ointment  Commonly known as:  PROTOPIC     vitamin B-12 1000 MCG tablet  Commonly known as:  CYANOCOBALAMIN  Take 1,000 mcg by mouth daily.        Allergies:  Allergies  Allergen Reactions  . Strawberry   . Tomato     Past Medical History  Diagnosis Date  . Diabetes mellitus   . Back pain   . PCOS (polycystic ovarian syndrome)   . Fibroids     Past Surgical History  Procedure Laterality Date  . Uterine fibroid surgery    . Breast surgery      benign    Family History  Problem Relation Age of Onset  . Hyperlipidemia Other   . Hypertension Other   . Stroke Other   . Diabetes Other   . Cancer Other     Social History:  reports that she has never smoked. She does not have any smokeless tobacco history on file. She reports that she does not drink alcohol or use illicit drugs.  Review of Systems -  No history of hypertension  Normal lipids without statin drugs  Lab Results  Component Value Date   CHOL 191 05/24/2014   HDL 83.90 05/24/2014   LDLCALC 95 05/24/2014   TRIG 60.0 05/24/2014   CHOLHDL 2 05/24/2014    Has history of uterine fibroids, periods are regular on BCP, apparently has history of PCOS.    Examination:   BP 124/71  Pulse 85  Temp(Src) 98 F (36.7 C)  Resp 16  Ht 5\' 5"  (1.651 m)  Wt 132 lb 9.6 oz (60.147 kg)  BMI 22.07 kg/m2  SpO2 96%  Body mass index is 22.07 kg/(m^2).   Assesment:   DIABETES type 2:  The patient's diabetes control is relatively higher although A1c is still near 7% Her recent hyperglycemia appears to be more related to her lack of exercise and inconsistent diet especially evening meals Since she has occasional blood sugars as low as 80 will not increase her Amaryl at this time Also may not tolerate more than 1500 mg of metformin total She will try to improve her self-care and followup in 2  months for A1c Also needs to do more readings after meals  Carigan Lister 07/02/2014, 3:18 PM

## 2014-07-04 ENCOUNTER — Other Ambulatory Visit: Payer: Self-pay | Admitting: Endocrinology

## 2014-08-29 ENCOUNTER — Other Ambulatory Visit (INDEPENDENT_AMBULATORY_CARE_PROVIDER_SITE_OTHER): Payer: BC Managed Care – PPO

## 2014-08-29 DIAGNOSIS — E1165 Type 2 diabetes mellitus with hyperglycemia: Secondary | ICD-10-CM

## 2014-08-29 DIAGNOSIS — IMO0002 Reserved for concepts with insufficient information to code with codable children: Secondary | ICD-10-CM

## 2014-08-29 LAB — GLUCOSE, RANDOM: Glucose, Bld: 112 mg/dL — ABNORMAL HIGH (ref 70–99)

## 2014-08-29 LAB — HEMOGLOBIN A1C: Hgb A1c MFr Bld: 7.3 % — ABNORMAL HIGH (ref 4.6–6.5)

## 2014-09-03 ENCOUNTER — Encounter: Payer: BC Managed Care – PPO | Attending: Physician Assistant | Admitting: Nutrition

## 2014-09-03 ENCOUNTER — Ambulatory Visit (INDEPENDENT_AMBULATORY_CARE_PROVIDER_SITE_OTHER): Payer: BC Managed Care – PPO | Admitting: Endocrinology

## 2014-09-03 ENCOUNTER — Other Ambulatory Visit: Payer: Self-pay | Admitting: *Deleted

## 2014-09-03 ENCOUNTER — Encounter: Payer: Self-pay | Admitting: Endocrinology

## 2014-09-03 VITALS — BP 118/62 | HR 90 | Temp 98.0°F | Resp 14 | Ht 65.0 in | Wt 128.6 lb

## 2014-09-03 DIAGNOSIS — Z794 Long term (current) use of insulin: Secondary | ICD-10-CM | POA: Diagnosis not present

## 2014-09-03 DIAGNOSIS — Z23 Encounter for immunization: Secondary | ICD-10-CM

## 2014-09-03 DIAGNOSIS — IMO0002 Reserved for concepts with insufficient information to code with codable children: Secondary | ICD-10-CM

## 2014-09-03 DIAGNOSIS — E1165 Type 2 diabetes mellitus with hyperglycemia: Secondary | ICD-10-CM | POA: Diagnosis present

## 2014-09-03 MED ORDER — INSULIN PEN NEEDLE 32G X 4 MM MISC: Status: DC

## 2014-09-03 NOTE — Progress Notes (Signed)
This patient was instructed on the use of the Humulin N pen.  We discussed how the insulin works and how to draw up and inject the pen.  We discussed different injection sites and the need to rotate sites appropriately.  She had no final questions.    We also discussed low blood sugars--symptoms and treatment.  She has glucose tablets for this.  She was told to take 2 tablets and if she does not feel better after 15 min., to take 2 more.  She agreed to do this.    We discussed the need to increase the dose of 5 units every 3 days until the FBS comes down below 120.  She reported good understanding of this and had no final questions.    Her meals are balanced, and not high in carbs or fat.  She will occasionally eat cereal and milk for breakfast, but was given different breakfast choices.  She agreed to do them.  She had a One Touch Ultra meter, that she said was over 102 years old.  She was given a new Ultra II meter and the date/time, and code were set for her.

## 2014-09-03 NOTE — Patient Instructions (Addendum)
Take 5u of Humulin N insulin at bedtime   Increase the dose by 1-2 units every 3 days until fasting blood sugar is under 120.   Test blood sugars before and 2 hours after one meal per day--varying the meal each day.

## 2014-09-03 NOTE — Patient Instructions (Signed)
Please check blood sugars at least half the time about 2 hours after any meal and times per week on waking up.  Please bring blood sugar monitor to each visit  Humulin N 5 units at bedtime and go up every 3 days to keep am sugar 90-130

## 2014-09-03 NOTE — Progress Notes (Signed)
[name], female   DOB: 1976-08-02, 38 y.o.   MRN: [id number]    Reason for Appointment: Diabetes follow-up   History of Present Illness   Diagnosis: date of diagnosis: 2009, initially asymptomatic, has type 2 diabetes.   PAST history:  She was apparently diagnosed to have diabetes by her gynecologist when she was complaining of excessive fatigue. She does not know what her initial blood sugar was but her A1c was 6.8. Before she started on medications her blood sugars were as high as 200+ after meals. She was started on medications after seeing a couple of different physicians and initially did well with 500 mg twice a day. In 2012 her A1c was relatively higher at 6.2 and although sugars are fairly good she was told to take metformin twice a day. This improved her blood sugars and her A1c was 5.9 in 12/12.   RECENT history:  She has been taking Janumet XR 100/1000 daily.  She tends to have intolerance to metformin or high doses of metformin ER  Currently taking Glumetza 500 mg in addition to her Janumet XR and she has no GI side effects with this Because of higher readings especially in the morning and difficulty taking more than 1000 mg of Glucophage XR she was has been on Amaryl 1 mg at bedtime.  Her blood sugars are still relatively high in the morning and she thinks that she is not eating much at night for fear of making the morning sugars go up Also rarely she has a low sugar in the early afternoon  Hypoglycemia: Has occurred about 2 times, lowest glucose 55   Not checking many readings after meals at all but has occasional high readings, highest was 310 after breakfast on the weekend  still although she is checking only a few readings later in the day Now her A1c is still relatively high She also has not been able to find time to exercise again  Side effects from medications: Diarrhea with metfomin, 2 g doses. Monitors blood glucose: About once a day  Glucometer:  Contour.  Blood Glucose readings checked mostly in the mornings from about 6 AM-11 AM   PREMEAL Breakfast Lunch Dinner Bedtime Overall  Glucose range:  70-173    77-184      Mean/median:  133      131    POST-MEAL PC Breakfast PC Lunch PC Dinner  Glucose range:  195, 291   57-75   99-123   Mean/median:       Meals: 3 meals per day, vegetarian, tries to get protein like nuts, [name] yogurt, soy, fish, counts carbs.  Dinner 8 pm Occasionally will have oatmeal at breakfast with some dairy product  Physical activity: exercise: none  Dietician visit: Most recent: 03/2013  Difficulties with medication adherence: None  Wt Readings from Last 3 Encounters:  09/03/14 128 lb 9.6 oz (58.333 kg)  07/02/14 132 lb 9.6 oz (60.147 kg)  01/25/14 127 lb 6.4 oz (57.788 kg)   The previous HbgA1c was 6.3% on 02/24/13 and 5.9 in 12/12.  Lab Results  Component Value Date   HGBA1C 7.3* 08/29/2014   HGBA1C 7.1* 05/24/2014   HGBA1C 6.6* 01/22/2014   Lab Results  Component Value Date   MICROALBUR 0.5 05/24/2014   LDLCALC 95 05/24/2014   CREATININE 0.7 05/24/2014     HYPERLIPIDEMIA:  previously had LDL of 108, now 95  Appointment on 08/29/2014  Component Date Value Ref Range Status  . Glucose, Bld  08/29/2014 112* 70 - 99 mg/dL Final  . Hemoglobin A1C 08/29/2014 7.3* 4.6 - 6.5 % Final   Glycemic Control Guidelines for People with Diabetes:Non Diabetic:  <6%Goal of Therapy: <7%Additional Action Suggested:  >8%       Medication List       This list is accurate as of: 09/03/14  8:38 AM.  Always use your most recent med list.               B-COMPLEX PO  Take by mouth.     BAYER CONTOUR NEXT TEST test strip  Generic drug:  glucose blood  daily.     Chromium 200 MCG Tabs  Take by mouth.     EPINEPHrine 0.3 mg/0.3 mL Devi  Commonly known as:  EPIPEN  Inject 0.3 mLs (0.3 mg total) into the muscle once.     escitalopram 10 MG tablet  Commonly known as:  LEXAPRO     FISH OIL + D3 PO  Take by  mouth.     glimepiride 1 MG tablet  Commonly known as:  AMARYL  TAKE ONE TABLET BY MOUTH AT BEDTIME     GLUMETZA 500 MG (MOD) 24 hr tablet  Generic drug:  metFORMIN  TAKE ONE TABLET BY MOUTH ONCE DAILY WITH BREAKFAST     INTEGRA F PO  Take by mouth.     LOMEDIA 24 FE PO  Take by mouth daily.     multivitamin with minerals Tabs tablet  Take 1 tablet by mouth daily.     SitaGLIPtin-MetFORMIN HCl [phone] MG Tb24  Commonly known as:  JANUMET XR  Take 100-1,000 mg by mouth daily after supper.     STRATTERA PO  Take by mouth. Unsure of dose     tacrolimus 0.1 % ointment  Commonly known as:  PROTOPIC     vitamin B-12 1000 MCG tablet  Commonly known as:  CYANOCOBALAMIN  Take 1,000 mcg by mouth daily.        Allergies:  Allergies  Allergen Reactions  . Strawberry   . Tomato     Past Medical History  Diagnosis Date  . Diabetes mellitus   . Back pain   . PCOS (polycystic ovarian syndrome)   . Fibroids     Past Surgical History  Procedure Laterality Date  . Uterine fibroid surgery    . Breast surgery      benign    Family History  Problem Relation Age of Onset  . Hyperlipidemia Other   . Hypertension Other   . Stroke Other   . Diabetes Other   . Cancer Other     Social History:  reports that she has never smoked. She does not have any smokeless tobacco history on file. She reports that she does not drink alcohol or use illicit drugs.  Review of Systems -  No history of hypertension  Normal lipids without statin drugs  Lab Results  Component Value Date   CHOL 191 05/24/2014   HDL 83.90 05/24/2014   LDLCALC 95 05/24/2014   TRIG 60.0 05/24/2014   CHOLHDL 2 05/24/2014    Has history of uterine fibroids, periods are regular on BCP, apparently has history of PCOS.    Examination:   BP 118/62  Pulse 90  Temp(Src) 98 F (36.7 C)  Resp 14  Ht 5\' 5"  (1.651 m)  Wt 128 lb 9.6 oz (58.333 kg)  BMI 21.40 kg/m2  SpO2 97%  Body mass index is 21.4 kg/(m^2).  Assesment:   DIABETES type 2:  The patient's diabetes control is appearing to be gradually worsening with A1c 7.3, has been previously upper normal  although A1c is still near 7% Her  hyperglycemia appears to be mostly overnight but not clear if she is getting high readings after breakfast and lunch  Often not checking blood sugars after meals including the now because she is eating late However her weight is better She is comfortable with the idea of taking bedtime insulin instead of Amaryl so that she can not stay hungry at night with getting back on portions Discussed basic principles of insulin administration, adjustment of the dose and she was instructed on use of the Humulin N. 10 by the nurse educator She will start with 5 units and titrate as directed to get morning sugars in the 90-130 range Also needs to do more readings after meals and followup in one month She will continue Glumetza and Janumet XR  Counseling time over 50% of today's 25 minute visit  Cumi Sanagustin 09/03/2014, 8:38 AM

## 2014-09-17 ENCOUNTER — Other Ambulatory Visit: Payer: Self-pay | Admitting: Endocrinology

## 2014-09-24 ENCOUNTER — Ambulatory Visit: Payer: BC Managed Care – PPO | Admitting: Endocrinology

## 2014-09-26 ENCOUNTER — Ambulatory Visit (INDEPENDENT_AMBULATORY_CARE_PROVIDER_SITE_OTHER): Payer: BC Managed Care – PPO | Admitting: Endocrinology

## 2014-09-26 ENCOUNTER — Encounter: Payer: Self-pay | Admitting: Endocrinology

## 2014-09-26 VITALS — BP 115/72 | HR 86 | Temp 98.0°F | Resp 14 | Ht 65.0 in | Wt 132.4 lb

## 2014-09-26 DIAGNOSIS — E1165 Type 2 diabetes mellitus with hyperglycemia: Secondary | ICD-10-CM

## 2014-09-26 DIAGNOSIS — IMO0002 Reserved for concepts with insufficient information to code with codable children: Secondary | ICD-10-CM

## 2014-09-26 MED ORDER — CANAGLIFLOZIN 100 MG PO TABS: 100.0000 mg | ORAL_TABLET | Freq: Every day | ORAL | Status: DC

## 2014-09-26 NOTE — Patient Instructions (Addendum)
Invokana 1 in am and stop Glumetza  Please check blood sugars at least half the time about 2 hours after any meal and times per week on waking up. Please bring blood sugar monitor to each visit  Stay on Janumet

## 2014-09-26 NOTE — Progress Notes (Signed)
[name], female   DOB: September 02, 1976, 38 y.o.   MRN: [id number]    Reason for Appointment: Diabetes follow-up   History of Present Illness   Diagnosis: date of diagnosis: 2009, initially asymptomatic, has type 2 diabetes.   PAST history:  She was apparently diagnosed to have diabetes by her gynecologist when she was complaining of excessive fatigue. She does not know what her initial blood sugar was but her A1c was 6.8. Before she started on medications her blood sugars were as high as 200+ after meals. She was started on medications after seeing a couple of different physicians and initially did well with 500 mg twice a day. In 2012 her A1c was relatively higher at 6.2 and although sugars are fairly good she was told to take metformin twice a day. This improved her blood sugars and her A1c was 5.9 in 12/12.   RECENT history:  Because of poor control with taking 1 mg Amaryl,Janumet XR 100/1000 and Glumetza she was started on bedtime NPH on 09/03/14 She tends to have intolerance to metformin or high doses of metformin ER  Most of her blood sugars were relatively high fasting and only occasionally in the evenings,: Also was not checking readings consistently after supper She did start 5 units of NPH at bedtime and initially had good response Subsequently had to gradually increase the dose because of relatively high fasting readings However since she felt she could eat more liberally she went off her diet and was having sweets and other high fat foods also in the evening Her blood sugars after supper have been fairly consistently high With increasing her insulin to 7 units she had a low blood sugar 2 nights ago at 2 AM Fasting readings are still slightly high Amaryl was stopped. She also has not been able to find time to exercise Although did walk a couple of times including last evening when her blood sugar was higher before supper  Side effects from medications: Diarrhea with  metfomin, 2 g doses. Monitors blood glucose: About onceor twice a day  Glucometer: Contour.  Blood Glucose readings   PREMEAL Breakfast 2-3 PM Dinner Bedtime Overall  Glucose range: 82-168 75-151  77, 186 126-283   Mean/median: 127   187 136   Meals: 3 meals per day, vegetarian, tries to get protein like nuts, [name] yogurt, soy, fish, counts carbs.  Dinner 8 pm Occasionally will have oatmeal at breakfast with some dairy product  Physical activity: exercise: rarely  Dietician visit: Most recent: 03/2013  Difficulties with medication adherence: None  Wt Readings from Last 3 Encounters:  09/26/14 132 lb 6.4 oz (60.056 kg)  09/03/14 128 lb 9.6 oz (58.333 kg)  07/02/14 132 lb 9.6 oz (60.147 kg)   The previous HbgA1c was 6.3% on 02/24/13 and 5.9 in 12/12.  Lab Results  Component Value Date   HGBA1C 7.3* 08/29/2014   HGBA1C 7.1* 05/24/2014   HGBA1C 6.6* 01/22/2014   Lab Results  Component Value Date   MICROALBUR 0.5 05/24/2014   LDLCALC 95 05/24/2014   CREATININE 0.7 05/24/2014     HYPERLIPIDEMIA:  previously had LDL of 108, now 95  No visits with results within 1 Week(s) from this visit. Latest known visit with results is:  Appointment on 08/29/2014  Component Date Value Ref Range Status  . Glucose, Bld 08/29/2014 112* 70 - 99 mg/dL Final  . Hgb A1c MFr Bld 08/29/2014 7.3* 4.6 - 6.5 % Final   Glycemic Control Guidelines for  People with Diabetes:Non Diabetic:  <6%Goal of Therapy: <7%Additional Action Suggested:  >8%       Medication List       This list is accurate as of: 09/26/14 11:21 AM.  Always use your most recent med list.               B-COMPLEX PO  Take by mouth.     BAYER CONTOUR NEXT TEST test strip  Generic drug:  glucose blood  daily.     Chromium 200 MCG Tabs  Take by mouth.     EPINEPHrine 0.3 mg/0.3 mL Devi  Commonly known as:  EPIPEN  Inject 0.3 mLs (0.3 mg total) into the muscle once.     escitalopram 10 MG tablet  Commonly known as:   LEXAPRO     FISH OIL + D3 PO  Take by mouth.     GLUMETZA 500 MG (MOD) 24 hr tablet  Generic drug:  metFORMIN  TAKE ONE TABLET BY MOUTH ONCE DAILY WITH BREAKFAST     Insulin Pen Needle 32G X 4 MM Misc  Commonly known as:  BD PEN NEEDLE NANO U/F  Use as directed     INTEGRA F PO  Take by mouth.     JANUMET XR [phone] MG Tb24  Generic drug:  SitaGLIPtin-MetFORMIN HCl  TAKE ONE TABLET BY MOUTH ONCE DAILY AFTER SUPPER.     LOMEDIA 24 FE PO  Take by mouth daily.     LOMEDIA 24 FE 1-20 MG-MCG(24) tablet  Generic drug:  Norethindrone Acetate-Ethinyl Estrad-FE     multivitamin with minerals Tabs tablet  Take 1 tablet by mouth daily.     STRATTERA 60 MG capsule  Generic drug:  atomoxetine     STRATTERA PO  Take by mouth. Unsure of dose     tacrolimus 0.1 % ointment  Commonly known as:  PROTOPIC     vitamin B-12 1000 MCG tablet  Commonly known as:  CYANOCOBALAMIN  Take 1,000 mcg by mouth daily.        Allergies:  Allergies  Allergen Reactions  . Strawberry   . Tomato     Past Medical History  Diagnosis Date  . Diabetes mellitus   . Back pain   . PCOS (polycystic ovarian syndrome)   . Fibroids     Past Surgical History  Procedure Laterality Date  . Uterine fibroid surgery    . Breast surgery      benign    Family History  Problem Relation Age of Onset  . Hyperlipidemia Other   . Hypertension Other   . Stroke Other   . Diabetes Other   . Cancer Other     Social History:  reports that she has never smoked. She does not have any smokeless tobacco history on file. She reports that she does not drink alcohol or use illicit drugs.  Review of Systems -  No history of hypertension  Normal lipids without statin drugs  Lab Results  Component Value Date   CHOL 191 05/24/2014   HDL 83.90 05/24/2014   LDLCALC 95 05/24/2014   TRIG 60.0 05/24/2014   CHOLHDL 2 05/24/2014    Has history of uterine fibroids, periods are regular on BCP, apparently has  history of PCOS.    Examination:   BP 115/72 mmHg  Pulse 86  Temp(Src) 98 F (36.7 C)  Resp 14  Ht 5\' 5"  (1.651 m)  Wt 132 lb 6.4 oz (60.056 kg)  BMI 22.03 kg/m2  SpO2  96%  Body mass index is 22.03 kg/(m^2).   Assesment:   DIABETES type 2:  The patient's diabetes control is appearing more difficult with tendency to high readings after her evening meal now This is partly related to her not watching her diet in the evenings She tends to have relatively high fasting readings also especially with stopping Amaryl However has not had tendency to low sugars in the afternoon with stopping Amaryl With low doses of bedtime NPH she is having some improvement in fasting readings but had one episode of low sugar at 2 AM  Since she is having high readings sporadically at all times she will now try Invokana 100 mg daily Discussed action of SGLT 2 drugs on lowering glucose by decreasing kidney absorption of glucose, benefits of weight loss, possible side effects including candidiasis and timing of taking the medication Given brochure and co-pay card She can continue to adjust her insulin based on fasting readings and may need to taper down the dose if Invokana is effective  She will continue  Janumet XR She will try to walk more regularly also Continue to check blood sugars at various times including after breakfast or lunch which she has not done much  Counseling time over 50% of today's 25 minute visit  Raeford Brandenburg 09/26/2014, 11:21 AM

## 2014-10-22 ENCOUNTER — Other Ambulatory Visit: Payer: BC Managed Care – PPO

## 2014-10-22 ENCOUNTER — Other Ambulatory Visit (INDEPENDENT_AMBULATORY_CARE_PROVIDER_SITE_OTHER): Payer: BC Managed Care – PPO

## 2014-10-22 DIAGNOSIS — E1165 Type 2 diabetes mellitus with hyperglycemia: Secondary | ICD-10-CM

## 2014-10-22 LAB — BASIC METABOLIC PANEL
BUN: 12 mg/dL (ref 6–23)
CALCIUM: 9.3 mg/dL (ref 8.4–10.5)
CO2: 23 mEq/L (ref 19–32)
Chloride: 101 mEq/L (ref 96–112)
Creatinine, Ser: 0.9 mg/dL (ref 0.4–1.2)
GFR: 93.82 mL/min (ref >60.00)
GLUCOSE: 75 mg/dL (ref 70–99)
POTASSIUM: 4 meq/L (ref 3.5–5.1)
Sodium: 136 mEq/L (ref 135–145)

## 2014-10-24 ENCOUNTER — Ambulatory Visit: Payer: BC Managed Care – PPO | Admitting: Endocrinology

## 2014-10-24 LAB — FRUCTOSAMINE: Fructosamine: 342 umol/L — ABNORMAL HIGH (ref 190–270)

## 2014-10-30 ENCOUNTER — Encounter: Payer: Self-pay | Admitting: Endocrinology

## 2014-10-30 ENCOUNTER — Ambulatory Visit (INDEPENDENT_AMBULATORY_CARE_PROVIDER_SITE_OTHER): Payer: BC Managed Care – PPO | Admitting: Endocrinology

## 2014-10-30 VITALS — BP 104/74 | HR 81 | Temp 97.9°F | Resp 14 | Ht 65.0 in | Wt 127.6 lb

## 2014-10-30 DIAGNOSIS — IMO0002 Reserved for concepts with insufficient information to code with codable children: Secondary | ICD-10-CM

## 2014-10-30 DIAGNOSIS — E1165 Type 2 diabetes mellitus with hyperglycemia: Secondary | ICD-10-CM

## 2014-10-30 NOTE — Progress Notes (Signed)
[name], female   DOB: 04/21/1976, 38 y.o.   MRN: [id number]    Reason for Appointment: Diabetes follow-up   History of Present Illness   Diagnosis: date of diagnosis: 2009, initially asymptomatic, has type 2 diabetes.   PAST history:  She was apparently diagnosed to have diabetes by her gynecologist when she was complaining of excessive fatigue. She does not know what her initial blood sugar was but her A1c was 6.8. Before she started on medications her blood sugars were as high as 200+ after meals. She was started on medications after seeing a couple of different physicians and initially did well with 500 mg twice a day. In 2012 her A1c was relatively higher at 6.2 and although sugars are fairly good she was told to take metformin twice a day. This improved her blood sugars and her A1c was 5.9 in 12/12.   RECENT history:  Because of poor control with taking 1 mg Amaryl, Janumet XR 100/1000 and Glumetza 500 mg she was started on bedtime NPH on 09/03/14.  Also Amaryl and Glumetza were stopped However with taking even small doses of insulin she would have sporadic high readings at different times of the day especially after meals and occasional low sugars late at night Improve her control she was given Invokana on 09/26/14 She has taken this in the morning without any side effects She appears to have better blood sugars overall and she subjectively feels better Also has lost a little weight She has been checking blood sugars much more often and ran out of strips, the last few days has checked glucose with a generic monitor Although her blood sugars are overall excellent on average she still has a high fructosamine for unknown reasons Blood sugar patterns:  Fasting blood sugars are somewhat variable but generally well controlled recently; may be higher if getting more carbohydrate the night before  Blood sugars are fairly good in the afternoon but trending higher after  supper; more recently has had only occasional high readings after supper if she has more carbohydrate or sweets  She has sporadic low readings in the 60s at various times of the day or late at night She also has not been able to find time to exercise again  Side effects from medications: Diarrhea with metfomin, 2 g doses. Monitors blood glucose: About 2-3 times a day a day  Glucometer: Contour.  Blood Glucose readings   PRE-MEAL Breakfast Lunch Dinner Bedtime Overall  Glucose range: 71-151    64-128  108-203   Mean/median:      114    POST-MEAL PC Breakfast PC Lunch PC Dinner  Glucose range:   60-301    Mean/median:       Meals: 3 meals per day, vegetarian, tries to get protein like nuts, [name] yogurt, soy, fish, counts carbs.  Dinner 8 pm Occasionally will have oatmeal at breakfast with some dairy product  Physical activity: exercise: none  Dietician visit: Most recent: 03/2013  Difficulties with medication adherence: None  Wt Readings from Last 3 Encounters:  10/30/14 127 lb 9.6 oz (57.879 kg)  09/26/14 132 lb 6.4 oz (60.056 kg)  09/03/14 128 lb 9.6 oz (58.333 kg)   The previous HbgA1c was 6.3% on 02/24/13 and 5.9 in 12/12.  Lab Results  Component Value Date   HGBA1C 7.3* 08/29/2014   HGBA1C 7.1* 05/24/2014   HGBA1C 6.6* 01/22/2014   Lab Results  Component Value Date   MICROALBUR 0.5 05/24/2014   [place]  95 05/24/2014   CREATININE 0.9 10/22/2014      No visits with results within 1 Week(s) from this visit. Latest known visit with results is:  Appointment on 10/22/2014  Component Date Value Ref Range Status  . Sodium 10/22/2014 136  135 - 145 mEq/L Final  . Potassium 10/22/2014 4.0  3.5 - 5.1 mEq/L Final  . Chloride 10/22/2014 101  96 - 112 mEq/L Final  . CO2 10/22/2014 23  19 - 32 mEq/L Final  . Glucose, Bld 10/22/2014 75  70 - 99 mg/dL Final  . BUN 10/22/2014 12  6 - 23 mg/dL Final  . Creatinine, Ser 10/22/2014 0.9  0.4 - 1.2 mg/dL Final  . Calcium  10/22/2014 9.3  8.4 - 10.5 mg/dL Final  . GFR 10/22/2014 93.82  >60.00 mL/min Final  . Fructosamine 10/22/2014 342* 190 - 270 umol/L Final      Medication List       This list is accurate as of: 10/30/14  1:17 PM.  Always use your most recent med list.               B-COMPLEX PO  Take by mouth.     BAYER CONTOUR NEXT TEST test strip  Generic drug:  glucose blood  daily.     canagliflozin 100 MG Tabs tablet  Commonly known as:  INVOKANA  Take 1 tablet (100 mg total) by mouth daily.     Chromium 200 MCG Tabs  Take by mouth.     EPINEPHrine 0.3 mg/0.3 mL Devi  Commonly known as:  EPIPEN  Inject 0.3 mLs (0.3 mg total) into the muscle once.     escitalopram 10 MG tablet  Commonly known as:  LEXAPRO     FISH OIL + D3 PO  Take by mouth.     GLUMETZA 500 MG (MOD) 24 hr tablet  Generic drug:  metFORMIN  TAKE ONE TABLET BY MOUTH ONCE DAILY WITH BREAKFAST     Insulin Pen Needle 32G X 4 MM Misc  Commonly known as:  BD PEN NEEDLE NANO U/F  Use as directed     INTEGRA F PO  Take by mouth.     JANUMET XR [phone] MG Tb24  Generic drug:  SitaGLIPtin-MetFORMIN HCl  TAKE ONE TABLET BY MOUTH ONCE DAILY AFTER SUPPER.     LOMEDIA 24 FE PO  Take by mouth daily.     LOMEDIA 24 FE 1-20 MG-MCG(24) tablet  Generic drug:  Norethindrone Acetate-Ethinyl Estrad-FE     multivitamin with minerals Tabs tablet  Take 1 tablet by mouth daily.     STRATTERA 60 MG capsule  Generic drug:  atomoxetine     tacrolimus 0.1 % ointment  Commonly known as:  PROTOPIC     vitamin B-12 1000 MCG tablet  Commonly known as:  CYANOCOBALAMIN  Take 1,000 mcg by mouth daily.        Allergies:  Allergies  Allergen Reactions  . Strawberry   . Tomato     Past Medical History  Diagnosis Date  . Diabetes mellitus   . Back pain   . PCOS (polycystic ovarian syndrome)   . Fibroids     Past Surgical History  Procedure Laterality Date  . Uterine fibroid surgery    . Breast surgery       benign    Family History  Problem Relation Age of Onset  . Hyperlipidemia Other   . Hypertension Other   . Stroke Other   . Diabetes Other   .  Cancer Other     Social History:  reports that she has never smoked. She does not have any smokeless tobacco history on file. She reports that she does not drink alcohol or use illicit drugs.  Review of Systems -  No history of hypertension  Normal lipids without statin drugs  Lab Results  Component Value Date   CHOL 191 05/24/2014   HDL 83.90 05/24/2014   LDLCALC 95 05/24/2014   TRIG 60.0 05/24/2014   CHOLHDL 2 05/24/2014    Has history of uterine fibroids, periods are regular on BCP, apparently has history of PCOS.    Examination:   BP 104/74 mmHg  Pulse 81  Temp(Src) 97.9 F (36.6 C)  Resp 14  Ht 5\' 5"  (1.651 m)  Wt 127 lb 9.6 oz (57.879 kg)  BMI 21.23 kg/m2  SpO2 99%  Body mass index is 21.23 kg/(m^2).   Assesment:   DIABETES type 2:  The patient's diabetes control is appearing more consistently controlled with adding Invokana Previously was having variable readings with bedtime insulin and Janumet XR She is sensitive to insulin and requiring only small doses to control overnight hyperglycemia With Invokana and her blood sugars are more evenly controlled and she is able to have more carbohydrates in her diet She will has high readings with larger portions of carbohydrates however Still has not exercised and emphasized the need to do this For now will continue her regimen unchanged She does need to have a bedtime snack with protein to avoid overnight hypoglycemia especially since her weight is decreased A1c to be checked on the next visit  Patient Instructions  Restart exercise  Reduce insulin if sugar gets below 70 overnight  Bedtime protein snack     Rebekkah Powless 10/30/2014, 1:17 PM

## 2014-10-30 NOTE — Patient Instructions (Signed)
Restart exercise  Reduce insulin if sugar gets below 70 overnight  Bedtime protein snack

## 2014-11-02 ENCOUNTER — Other Ambulatory Visit: Payer: Self-pay | Admitting: *Deleted

## 2014-11-02 ENCOUNTER — Other Ambulatory Visit: Payer: Self-pay | Admitting: Endocrinology

## 2014-11-02 ENCOUNTER — Telehealth: Payer: Self-pay | Admitting: Endocrinology

## 2014-11-02 MED ORDER — INSULIN ISOPHANE HUMAN 100 UNIT/ML KWIKPEN: PEN_INJECTOR | SUBCUTANEOUS | Status: DC

## 2014-11-02 NOTE — Telephone Encounter (Signed)
Patient stated that her pharmacy [facility] on [address] haven't received the fax for medication Humlin. Please advise when it is faxed over.

## 2014-11-02 NOTE — Telephone Encounter (Signed)
This was Humulin N as discussed, order sent

## 2014-11-02 NOTE — Telephone Encounter (Signed)
Please see below and if appropriate can you send the script?  I do not see anything in your notes about this type of insulin or what dose she is suppose to be on?

## 2014-11-05 ENCOUNTER — Other Ambulatory Visit: Payer: Self-pay | Admitting: *Deleted

## 2014-11-05 MED ORDER — "INSULIN SYRINGE 31G X 5/16"" 0.3 ML MISC": Status: DC

## 2014-11-05 MED ORDER — INSULIN NPH (HUMAN) (ISOPHANE) 100 UNIT/ML ~~LOC~~ SUSP: SUBCUTANEOUS | Status: DC

## 2014-12-27 ENCOUNTER — Other Ambulatory Visit (INDEPENDENT_AMBULATORY_CARE_PROVIDER_SITE_OTHER): Payer: BLUE CROSS/BLUE SHIELD

## 2014-12-27 DIAGNOSIS — E1165 Type 2 diabetes mellitus with hyperglycemia: Secondary | ICD-10-CM

## 2014-12-27 DIAGNOSIS — IMO0002 Reserved for concepts with insufficient information to code with codable children: Secondary | ICD-10-CM

## 2014-12-27 LAB — GLUCOSE, RANDOM: Glucose, Bld: 127 mg/dL — ABNORMAL HIGH (ref 70–99)

## 2014-12-27 LAB — HEMOGLOBIN A1C: HEMOGLOBIN A1C: 7 % — AB (ref 4.6–6.5)

## 2014-12-31 ENCOUNTER — Ambulatory Visit (INDEPENDENT_AMBULATORY_CARE_PROVIDER_SITE_OTHER): Payer: BLUE CROSS/BLUE SHIELD | Admitting: Endocrinology

## 2014-12-31 ENCOUNTER — Encounter: Payer: Self-pay | Admitting: Endocrinology

## 2014-12-31 VITALS — BP 112/72 | HR 76 | Temp 97.9°F | Resp 14 | Ht 65.0 in | Wt 126.0 lb

## 2014-12-31 DIAGNOSIS — IMO0002 Reserved for concepts with insufficient information to code with codable children: Secondary | ICD-10-CM

## 2014-12-31 DIAGNOSIS — E1165 Type 2 diabetes mellitus with hyperglycemia: Secondary | ICD-10-CM

## 2014-12-31 NOTE — Patient Instructions (Addendum)
Please check blood sugars at least half the time about 2 hours after any meal and 3 times per week on waking up. Please bring blood sugar monitor to each visit. Recommended blood sugar levels about 2 hours after meal is 140-160 and on waking up 90-130  Restart insulin 4 units  Mid afternoon snack

## 2014-12-31 NOTE — Progress Notes (Signed)
[name], female   DOB: [birth year], 39 y.o.   MRN: [id number]    Reason for Appointment: Diabetes follow-up   History of Present Illness   Diagnosis: date of diagnosis: 2009, initially asymptomatic, has type 2 diabetes.   PAST history:  She was apparently diagnosed to have diabetes by her gynecologist when she was complaining of excessive fatigue. She does not know what her initial blood sugar was but her A1c was 6.8. Before she started on medications her blood sugars were as high as 200+ after meals. She was started on medications after seeing a couple of different physicians and initially did well with 500 mg twice a day. In 2012 her A1c was relatively higher at 6.2 and although sugars are fairly good she was told to take metformin twice a day. This improved her blood sugars and her A1c was 5.9 in 12/12.   RECENT history:   Current treatment regimen: Insulin 4-6 at bedtime, none recently.  Janumet XR 100/1000 at dinner, Invokana 100 mg in a.m.  Because of poor control with taking 1 mg Amaryl, Janumet XR 100/1000 and Glumetza 500 mg she was started on bedtime NPH on 09/03/14.   Also Amaryl and Glumetza were stopped Because of inadequate control she was also given Invokana on 09/26/14.  She appears to have better blood sugars overall with this regimen and her A1c has been trending lower Blood sugar patterns and problems identified:  Fasting blood sugars are variable but recently higher because she has not been taking insulin for the last few days.  She did not take insulin because of starting exercise but apparently her blood sugars are not improved with exercise.  She has not checked her readings after meals as directed and has only sporadic readings at lunch or bedtime  Has had only one significant high reading of 246 after supper   She generally trying to eat healthy balanced meals with some protein but she tends to get excessively hungry especially at  suppertime  No hypoglycemia currently and was not getting low sugars when she was taking up to 5 units of bedtime NPH  She had just started a program of exercise doing an exercise bike and weights.  Not doing very often so far  Side effects from medications: Diarrhea with metfomin, 2 g doses. Monitors blood glucose: About 2-3 times a day a day  Glucometer: Contour.  Blood Glucose readings   PRE-MEAL Breakfast Lunch Dinner Bedtime Overall  Glucose range:  99-172   143    90, 246    Mean/median: 136     142    Meals: 3 meals per day, vegetarian, tries to get protein like nuts, [name] yogurt, soy, fish, counts carbs. Dinner usually at 8 pm Occasionally will have oatmeal at breakfast with some dairy product  Physical activity: exercise: Bike/weights about once or twice a week recently  Dietician visit: Most recent: 03/2013  Difficulties with medication adherence: None  Wt Readings from Last 3 Encounters:  12/31/14 126 lb (57.153 kg)  10/30/14 127 lb 9.6 oz (57.879 kg)  09/26/14 132 lb 6.4 oz (60.056 kg)    LABS:  Lab Results  Component Value Date   HGBA1C 7.0* 12/27/2014   HGBA1C 7.3* 08/29/2014   HGBA1C 7.1* 05/24/2014   Lab Results  Component Value Date   MICROALBUR 0.5 05/24/2014   [place] 95 05/24/2014   CREATININE 0.9 10/22/2014      Appointment on 12/27/2014  Component Date Value Ref Range Status  .  Hgb A1c MFr Bld 12/27/2014 7.0* 4.6 - 6.5 % Final   Glycemic Control Guidelines for People with Diabetes:Non Diabetic:  <6%Goal of Therapy: <7%Additional Action Suggested:  >8%   . Glucose, Bld 12/27/2014 127* 70 - 99 mg/dL Final      Medication List       This list is accurate as of: 12/31/14 10:03 AM.  Always use your most recent med list.               B-COMPLEX PO  Take by mouth.     BAYER CONTOUR NEXT TEST test strip  Generic drug:  glucose blood  daily.     canagliflozin 100 MG Tabs tablet  Commonly known as:  INVOKANA  Take 1 tablet (100 mg total)  by mouth daily.     Chromium 200 MCG Tabs  Take by mouth.     EPINEPHrine 0.3 mg/0.3 mL Devi  Commonly known as:  EPIPEN  Inject 0.3 mLs (0.3 mg total) into the muscle once.     escitalopram 10 MG tablet  Commonly known as:  LEXAPRO     FISH OIL + D3 PO  Take by mouth.     insulin NPH Human 100 UNIT/ML injection  Commonly known as:  NOVOLIN N  Use as directed, up to 10 units at bedtime     Insulin Pen Needle 32G X 4 MM Misc  Commonly known as:  BD PEN NEEDLE NANO U/F  Use as directed     INSULIN SYRINGE .3CC/31GX5/16" 31G X 5/16" 0.3 ML Misc  Use one per day     RELION INSULIN SYR 0.3ML/31G 31G X 5/16" 0.3 ML Misc  Generic drug:  Insulin Syringe-Needle U-100     INTEGRA F PO  Take by mouth.     JANUMET XR [phone] MG Tb24  Generic drug:  SitaGLIPtin-MetFORMIN HCl  TAKE ONE TABLET BY MOUTH ONCE DAILY AFTER SUPPER.     LOMEDIA 24 FE 1-20 MG-MCG(24) tablet  Generic drug:  Norethindrone Acetate-Ethinyl Estrad-FE     multivitamin with minerals Tabs tablet  Take 1 tablet by mouth daily.     STRATTERA 60 MG capsule  Generic drug:  atomoxetine     tacrolimus 0.1 % ointment  Commonly known as:  PROTOPIC     vitamin B-12 1000 MCG tablet  Commonly known as:  CYANOCOBALAMIN  Take 1,000 mcg by mouth daily.        Allergies:  Allergies  Allergen Reactions  . Strawberry   . Tomato     Past Medical History  Diagnosis Date  . Diabetes mellitus   . Back pain   . PCOS (polycystic ovarian syndrome)   . Fibroids     Past Surgical History  Procedure Laterality Date  . Uterine fibroid surgery    . Breast surgery      benign    Family History  Problem Relation Age of Onset  . Hyperlipidemia Other   . Hypertension Other   . Stroke Other   . Diabetes Other   . Cancer Other     Social History:  reports that she has never smoked. She does not have any smokeless tobacco history on file. She reports that she does not drink alcohol or use illicit  drugs.  Review of Systems -  Has had normal lipids  Lab Results  Component Value Date   CHOL 191 05/24/2014   HDL 83.90 05/24/2014   LDLCALC 95 05/24/2014   TRIG 60.0 05/24/2014   CHOLHDL  2 05/24/2014    Has history of uterine fibroids, periods are regular on BCP, apparently has history of PCOS.    Examination:   BP 112/72 mmHg  Pulse 76  Temp(Src) 97.9 F (36.6 C)  Resp 14  Ht 5\' 5"  (1.651 m)  Wt 126 lb (57.153 kg)  BMI 20.97 kg/m2  SpO2 98%  Body mass index is 20.97 kg/(m^2).   Assesment:   DIABETES type 2:  The patient's diabetes control is somewhat inconsistent with periodic high readings in the mornings and difficult to assess her postprandial control since she does not check these very much A1c is slightly better at 7.0 She likely has some insulin deficiency as she did not respond well to Amaryl and did have better fasting readings with small dose of bedtime NPH  Currently she is taking only Janumet and Invokana; fasting blood sugars are to be higher with her stopping insulin when she was starting an exercise regimen. Discussed day-to-day management in detail including balanced meals and adding snacks in between meals to avoid larger portions at suppertime She will try to exercise at least 3 times a week Most likely will need to continue small doses of bedtime NPH but may also consider switching to Levemir if having nocturnal hypoglycemia She does need to check more readings after meals to help assess her postprandial states May also benefit from higher doses of Invokana especially if any high post prandial readings  Patient Instructions  Please check blood sugars at least half the time about 2 hours after any meal and 3 times per week on waking up. Please bring blood sugar monitor to each visit. Recommended blood sugar levels about 2 hours after meal is 140-160 and on waking up 90-130  Restart insulin 4 units  Mid afternoon snack    Counseling time over  50% of today's 25 minute visit  Carlon Chaloux 12/31/2014, 10:03 AM

## 2015-02-01 ENCOUNTER — Encounter (HOSPITAL_COMMUNITY): Payer: Self-pay | Admitting: Emergency Medicine

## 2015-02-01 ENCOUNTER — Emergency Department (HOSPITAL_COMMUNITY)
Admission: EM | Admit: 2015-02-01 | Discharge: 2015-02-01 | Disposition: A | Discharge status: Home / Self Care | Payer: BLUE CROSS/BLUE SHIELD | Source: Home / Self Care | Attending: Emergency Medicine | Admitting: Emergency Medicine

## 2015-02-01 DIAGNOSIS — J0100 Acute maxillary sinusitis, unspecified: Secondary | ICD-10-CM

## 2015-02-01 MED ORDER — AZITHROMYCIN 250 MG PO TABS: 250.0000 mg | ORAL_TABLET | Freq: Every day | ORAL | Status: DC

## 2015-02-01 NOTE — Discharge Instructions (Signed)

## 2015-02-01 NOTE — ED Notes (Signed)
C/o cold sx onset Tuesday Sx include productive cough, fatigue, hoarseness, chest d/c, bilateral ears clogged Taking mucinex w/no relief Alert, no signs of acute distress.

## 2015-02-01 NOTE — ED Provider Notes (Signed)
CSN: [id number]     Arrival date & time 02/01/15  3903 History   First MD Initiated Contact with Patient 02/01/15 [id number]     Chief Complaint  Patient presents with  . URI   (Consider location/radiation/quality/duration/timing/severity/associated sxs/prior Treatment) Patient is a 39 y.o. female presenting with URI. The history is provided by the patient. No language interpreter was used.  URI Presenting symptoms: congestion   Severity:  Moderate Onset quality:  Gradual Duration:  1 week Timing:  Constant Progression:  Worsening Chronicity:  New Relieved by:  Nothing Worsened by:  Nothing tried Ineffective treatments:  None tried Associated symptoms: sinus pain   Risk factors: diabetes mellitus     Past Medical History  Diagnosis Date  . Diabetes mellitus   . Back pain   . PCOS (polycystic ovarian syndrome)   . Fibroids    Past Surgical History  Procedure Laterality Date  . Uterine fibroid surgery    . Breast surgery      benign   Family History  Problem Relation Age of Onset  . Hyperlipidemia Other   . Hypertension Other   . Stroke Other   . Diabetes Other   . Cancer Other    History  Substance Use Topics  . Smoking status: Never Smoker   . Smokeless tobacco: Not on file  . Alcohol Use: No   OB History    No data available     Review of Systems  HENT: Positive for congestion.   All other systems reviewed and are negative.   Allergies  Strawberry and Tomato  Home Medications   Prior to Admission medications   Medication Sig Start Date End Date Taking? Authorizing Provider  escitalopram (LEXAPRO) 10 MG tablet  10/16/13  Yes Historical Provider, MD  insulin NPH Human (NOVOLIN N) 100 UNIT/ML injection Use as directed, up to 10 units at bedtime 11/05/14  Yes Elayne Snare, MD  JANUMET XR [phone] MG TB24 TAKE ONE TABLET BY MOUTH ONCE DAILY AFTER SUPPER. 09/18/14  Yes Elayne Snare, MD  STRATTERA 60 MG capsule  09/07/14  Yes Historical Provider, MD  B  Complex-Biotin-FA (B-COMPLEX PO) Take by mouth.    Historical Provider, MD  BAYER CONTOUR NEXT TEST test strip daily. 07/02/13   Historical Provider, MD  canagliflozin (INVOKANA) 100 MG TABS tablet Take 1 tablet (100 mg total) by mouth daily. 09/26/14   Elayne Snare, MD  Chromium 200 MCG TABS Take by mouth.    Historical Provider, MD  EPINEPHrine (EPIPEN) 0.3 mg/0.3 mL DEVI Inject 0.3 mLs (0.3 mg total) into the muscle once. 08/04/12   Adlih Moreno-Coll, MD  Fe Fum-FePoly-FA-Vit C-Vit B3 (INTEGRA F PO) Take by mouth.    Historical Provider, MD  Fish Oil-Cholecalciferol (FISH OIL + D3 PO) Take by mouth.    Historical Provider, MD  Insulin Pen Needle (BD PEN NEEDLE NANO U/F) 32G X 4 MM MISC Use as directed 09/03/14   Elayne Snare, MD  Insulin Syringe-Needle U-100 (INSULIN SYRINGE .3CC/31GX5/16") 31G X 5/16" 0.3 ML MISC Use one per day 11/05/14   Elayne Snare, MD  LOMEDIA 24 FE 1-20 MG-MCG(24) tablet  09/08/14   Historical Provider, MD  Multiple Vitamin (MULTIVITAMIN WITH MINERALS) TABS Take 1 tablet by mouth daily.    Historical Provider, MD  [place] 0.3ML/31G 31G X 5/16" 0.3 ML MISC  11/18/14   Historical Provider, MD  tacrolimus (PROTOPIC) 0.1 % ointment  11/02/13   Historical Provider, MD  vitamin B-12 (CYANOCOBALAMIN) 1000 MCG tablet  Take 1,000 mcg by mouth daily.    Historical Provider, MD   BP 110/76 mmHg  Pulse 79  Temp(Src) 98.9 F (37.2 C) (Oral)  Resp 16  SpO2 99%  LMP 01/25/2015 Physical Exam  Constitutional: She is oriented to person, place, and time. She appears well-developed and well-nourished.  HENT:  Head: Normocephalic.  Tender maxillary sinuses  Eyes: EOM are normal. Pupils are equal, round, and reactive to light.  Neck: Normal range of motion.  Cardiovascular: Normal rate.   Pulmonary/Chest: Effort normal.  Abdominal: She exhibits no distension.  Musculoskeletal: Normal range of motion.  Neurological: She is alert and oriented to person, place, and time.   Psychiatric: She has a normal mood and affect.  Nursing note and vitals reviewed.   ED Course  Procedures (including critical care time) Labs Review Labs Reviewed - No data to display  Imaging Review No results found.   MDM   1. Acute maxillary sinusitis, recurrence not specified    zithromax  See your Physcian for recheck in 1 week   Fransico Meadow, PA-C 02/01/15 [place], PA-C 02/01/15 [place], [place] 02/01/15 1022

## 2015-02-05 ENCOUNTER — Other Ambulatory Visit: Payer: Self-pay | Admitting: Endocrinology

## 2015-02-21 ENCOUNTER — Other Ambulatory Visit: Payer: Self-pay | Admitting: *Deleted

## 2015-02-21 MED ORDER — GLUCOSE BLOOD VI STRP: ORAL_STRIP | Status: DC

## 2015-03-02 ENCOUNTER — Other Ambulatory Visit: Payer: Self-pay | Admitting: Endocrinology

## 2015-03-04 ENCOUNTER — Other Ambulatory Visit: Payer: Self-pay | Admitting: *Deleted

## 2015-03-04 MED ORDER — CANAGLIFLOZIN 100 MG PO TABS: 100.0000 mg | ORAL_TABLET | Freq: Every day | ORAL | Status: DC

## 2015-03-07 ENCOUNTER — Telehealth: Payer: Self-pay

## 2015-03-07 NOTE — Telephone Encounter (Signed)
Pt needs a PA for the following:  1. Janumet   Alternatives covered: Combiglize XR     Jentaduteo  2. Contour Next Test Strips  Alternatives covered: Onetouch Ultra   Please advise if there is a change in rx. Also to note, pt has not had a new meter for over a year. Pt may qualify for a new meter.

## 2015-03-07 NOTE — Telephone Encounter (Signed)
Janumet will be replaced by Kombiglyze XR 03/999, 1 tablet daily One Touch ultra is fine, she can get new meter

## 2015-03-08 ENCOUNTER — Other Ambulatory Visit: Payer: Self-pay | Admitting: *Deleted

## 2015-03-08 MED ORDER — SAXAGLIPTIN-METFORMIN ER 5-1000 MG PO TB24: 1.0000 | ORAL_TABLET | Freq: Every day | ORAL | Status: DC

## 2015-03-08 MED ORDER — CANAGLIFLOZIN 100 MG PO TABS: 100.0000 mg | ORAL_TABLET | Freq: Every day | ORAL | Status: DC

## 2015-03-08 MED ORDER — ONETOUCH ULTRA 2 W/DEVICE KIT: PACK | Status: DC

## 2015-03-08 MED ORDER — BLOOD GLUCOSE MONITOR KIT: PACK | Status: DC

## 2015-03-08 MED ORDER — GLUCOSE BLOOD VI STRP: ORAL_STRIP | Status: DC

## 2015-03-08 NOTE — Telephone Encounter (Signed)
Pt informed of rx changes. Old rx dc on med list and new rx have been erx to [facility] [address]).

## 2015-03-09 ENCOUNTER — Encounter (HOSPITAL_COMMUNITY): Payer: Self-pay | Admitting: Emergency Medicine

## 2015-03-09 ENCOUNTER — Emergency Department (HOSPITAL_COMMUNITY)
Admission: EM | Admit: 2015-03-09 | Discharge: 2015-03-10 | Disposition: A | Discharge status: Home / Self Care | Payer: BLUE CROSS/BLUE SHIELD | Attending: Emergency Medicine | Admitting: Emergency Medicine

## 2015-03-09 DIAGNOSIS — H538 Other visual disturbances: Secondary | ICD-10-CM

## 2015-03-09 DIAGNOSIS — Z86018 Personal history of other benign neoplasm: Secondary | ICD-10-CM | POA: Diagnosis not present

## 2015-03-09 DIAGNOSIS — E119 Type 2 diabetes mellitus without complications: Secondary | ICD-10-CM | POA: Diagnosis not present

## 2015-03-09 DIAGNOSIS — Z794 Long term (current) use of insulin: Secondary | ICD-10-CM | POA: Diagnosis not present

## 2015-03-09 DIAGNOSIS — Z79899 Other long term (current) drug therapy: Secondary | ICD-10-CM | POA: Diagnosis not present

## 2015-03-09 LAB — CBG MONITORING, ED: Glucose-Capillary: 190 mg/dL — ABNORMAL HIGH (ref 70–99)

## 2015-03-09 MED ORDER — CYCLOPENTOLATE HCL 1 % OP SOLN
2.0000 [drp] | Freq: Once | OPHTHALMIC | Status: AC
  Administered 2015-03-09: 2 [drp] via OPHTHALMIC
  Filled 2015-03-09: qty 2

## 2015-03-09 NOTE — ED Provider Notes (Signed)
CSN: [id number]     Arrival date & time 03/09/15  2148 History   First MD Initiated Contact with Patient 03/09/15 2259     Chief Complaint  Patient presents with  . Blurred Vision      (Consider location/radiation/quality/duration/timing/severity/associated sxs/prior Treatment) HPI  This is a 39 year old female with history of diabetes. Due to insurance problems she was off of her oral anti-hyperglycemics for about a week, though she continued to take her insulin. She restarted her Invokana and Kombiglyze XR yesterday. Today she noticed that her vision was blurry. She describes this as haziness of distance objects with sharper vision close up. She is not having any eye pain. She denies nausea or vomiting. She has had diarrhea but that is usual when she is on her oral anti-hyperglycemics. She had a headache yesterday but this is resolved. She drinks a lot of fluids and urinates frequently but she reports that this is baseline. She was able to run a 5K today without difficulty. Her CBG on arrival was 190.  Past Medical History  Diagnosis Date  . Diabetes mellitus   . Back pain   . PCOS (polycystic ovarian syndrome)   . Fibroids    Past Surgical History  Procedure Laterality Date  . Uterine fibroid surgery    . Breast surgery      benign   Family History  Problem Relation Age of Onset  . Hyperlipidemia Other   . Hypertension Other   . Stroke Other   . Diabetes Other   . Cancer Other    History  Substance Use Topics  . Smoking status: Never Smoker   . Smokeless tobacco: Not on file  . Alcohol Use: No   OB History    No data available     Review of Systems  All other systems reviewed and are negative.   Allergies  Strawberry and Tomato  Home Medications   Prior to Admission medications   Medication Sig Start Date End Date Taking? Authorizing Provider  azithromycin (ZITHROMAX) 250 MG tablet Take 1 tablet (250 mg total) by mouth daily. Take first 2 tablets together,  then 1 every day until finished. 02/01/15   Fransico Meadow, PA-C  B Complex-Biotin-FA (B-COMPLEX PO) Take by mouth.    Historical Provider, MD  blood glucose meter kit and supplies KIT Use as instructed to test blood sugar. DX: E11.09 03/08/15   Elayne Snare, MD  Blood Glucose Monitoring Suppl (ONE TOUCH ULTRA 2) W/DEVICE KIT Use to check blood sugar 3 times per day dx code E11.9 03/08/15   Elayne Snare, MD  canagliflozin (INVOKANA) 100 MG TABS tablet Take 1 tablet (100 mg total) by mouth daily. 03/08/15   Elayne Snare, MD  Chromium 200 MCG TABS Take by mouth.    Historical Provider, MD  EPINEPHrine (EPIPEN) 0.3 mg/0.3 mL DEVI Inject 0.3 mLs (0.3 mg total) into the muscle once. 08/04/12   Adlih Moreno-Coll, MD  escitalopram (LEXAPRO) 10 MG tablet  10/16/13   Historical Provider, MD  Fe Fum-FePoly-FA-Vit C-Vit B3 (INTEGRA F PO) Take by mouth.    Historical Provider, MD  Fish Oil-Cholecalciferol (FISH OIL + D3 PO) Take by mouth.    Historical Provider, MD  glucose blood test strip Use as instructed to test blood sugar three times a day. DX E11.09 03/08/15   Elayne Snare, MD  insulin NPH Human (NOVOLIN N) 100 UNIT/ML injection Use as directed, up to 10 units at bedtime 11/05/14   Elayne Snare, MD  Insulin  Pen Needle (BD PEN NEEDLE NANO U/F) 32G X 4 MM MISC Use as directed 09/03/14   Elayne Snare, MD  Insulin Syringe-Needle U-100 (INSULIN SYRINGE .3CC/31GX5/16") 31G X 5/16" 0.3 ML MISC Use one per day 11/05/14   Elayne Snare, MD  LOMEDIA 24 FE 1-20 MG-MCG(24) tablet  09/08/14   Historical Provider, MD  Multiple Vitamin (MULTIVITAMIN WITH MINERALS) TABS Take 1 tablet by mouth daily.    Historical Provider, MD  [place] 0.3ML/31G 31G X 5/16" 0.3 ML MISC  11/18/14   Historical Provider, MD  Saxagliptin-Metformin 03-999 MG TB24 Take 1 tablet by mouth daily after supper. 03/08/15   Elayne Snare, MD  STRATTERA 60 MG capsule  09/07/14   Historical Provider, MD  tacrolimus (PROTOPIC) 0.1 % ointment  11/02/13   Historical  Provider, MD  vitamin B-12 (CYANOCOBALAMIN) 1000 MCG tablet Take 1,000 mcg by mouth daily.    Historical Provider, MD   BP 124/70 mmHg  Pulse 77  Temp(Src) 98.4 F (36.9 C) (Oral)  Resp 16  SpO2 98%   Physical Exam  General: Well-developed, well-nourished female in no acute distress; appearance consistent with age of record HENT: normocephalic; atraumatic Eyes: pupils equal, round and reactive to light; extraocular muscles intact; no conjunctival injection; disc margins sharp, no retinal hemorrhages seen; visual acuity 20/20 bilaterally Neck: supple Heart: regular rate and rhythm Lungs: clear to auscultation bilaterally Abdomen: soft; nondistended; nontender; bowel sounds present Extremities: No deformity; full range of motion; pulses normal Neurologic: Awake, alert and oriented; motor function intact in all extremities and symmetric; no facial droop Skin: Warm and dry Psychiatric: Normal mood and affect    ED Course  Procedures (including critical care time)   MDM   Nursing notes and vitals signs, including pulse oximetry, reviewed.  Summary of this visit's results, reviewed by myself:  Labs:  Results for orders placed or performed during the hospital encounter of 03/09/15 (from the past 24 hour(s))  POC CBG, ED     Status: Abnormal   Collection Time: 03/09/15 10:34 PM  Result Value Ref Range   Glucose-Capillary 190 (H) 70 - 99 mg/dL   12:10 AM Suspect blurriness is due to patient blood sugars shifting and restabilize and response to medication adjustments. Patient does have an eye doctor with whom she can follow-up if symptoms persist. There is no evidence of concerning ophthalmologic problem at this time.   Shanon Rosser, MD 03/10/15 [phone]

## 2015-03-09 NOTE — ED Notes (Addendum)
Pt states her insurance changed and she started some new meds for her Type II DM yesterday. Pt took [place] along with her Novolin yesterday. States she developed blurred distance vision yesterday and it has continued today. Last eye exam 6 months ago and was normal.  States she had a headache yesterday, but it resolved. Pt denies pain at present. Pt alert, no acute distress. States she ran in a 5K today without difficulty.

## 2015-03-11 ENCOUNTER — Other Ambulatory Visit: Payer: Self-pay | Admitting: *Deleted

## 2015-03-11 MED ORDER — GLUCOSE BLOOD VI STRP: ORAL_STRIP | Status: DC

## 2015-03-11 MED ORDER — ONETOUCH DELICA LANCETS FINE MISC: Status: DC

## 2015-03-11 MED ORDER — CANAGLIFLOZIN 100 MG PO TABS: 100.0000 mg | ORAL_TABLET | Freq: Every day | ORAL | Status: DC

## 2015-03-27 ENCOUNTER — Other Ambulatory Visit (INDEPENDENT_AMBULATORY_CARE_PROVIDER_SITE_OTHER): Payer: BLUE CROSS/BLUE SHIELD

## 2015-03-27 DIAGNOSIS — E1165 Type 2 diabetes mellitus with hyperglycemia: Secondary | ICD-10-CM

## 2015-03-27 DIAGNOSIS — IMO0002 Reserved for concepts with insufficient information to code with codable children: Secondary | ICD-10-CM

## 2015-03-27 LAB — BASIC METABOLIC PANEL
BUN: 14 mg/dL (ref 6–23)
CALCIUM: 9.4 mg/dL (ref 8.4–10.5)
CO2: 25 meq/L (ref 19–32)
CREATININE: 0.85 mg/dL (ref 0.40–1.20)
Chloride: 103 mEq/L (ref 96–112)
GFR: 94.89 mL/min (ref >60.00)
Glucose, Bld: 125 mg/dL — ABNORMAL HIGH (ref 70–99)
Potassium: 4.3 mEq/L (ref 3.5–5.1)
Sodium: 137 mEq/L (ref 135–145)

## 2015-03-27 LAB — URINALYSIS, ROUTINE W REFLEX MICROSCOPIC
BILIRUBIN URINE: NEGATIVE
Ketones, ur: NEGATIVE
Leukocytes, UA: NEGATIVE
Nitrite: NEGATIVE
Specific Gravity, Urine: 1.02 (ref 1.000–1.030)
TOTAL PROTEIN, URINE-UPE24: NEGATIVE
UROBILINOGEN UA: 0.2 (ref 0.0–1.0)
Urine Glucose: >=1000 — AB
pH: 6 (ref 5.0–8.0)

## 2015-03-27 LAB — LIPID PANEL
Cholesterol: 215 mg/dL — ABNORMAL HIGH (ref 0–200)
HDL: 85.1 mg/dL (ref >39.00)
LDL CALC: 118 mg/dL — AB (ref 0–99)
NonHDL: 129.9
Total CHOL/HDL Ratio: 3
Triglycerides: 61 mg/dL (ref 0.0–149.0)
VLDL: 12.2 mg/dL (ref 0.0–40.0)

## 2015-03-27 LAB — MICROALBUMIN / CREATININE URINE RATIO
CREATININE, U: 156.1 mg/dL
Microalb Creat Ratio: 0.4 mg/g (ref 0.0–30.0)
Microalb, Ur: 0.7 mg/dL (ref 0.0–1.9)

## 2015-03-27 LAB — HEMOGLOBIN A1C: Hgb A1c MFr Bld: 7.3 % — ABNORMAL HIGH (ref 4.6–6.5)

## 2015-04-01 ENCOUNTER — Ambulatory Visit: Payer: BLUE CROSS/BLUE SHIELD | Admitting: Endocrinology

## 2015-04-02 ENCOUNTER — Ambulatory Visit (INDEPENDENT_AMBULATORY_CARE_PROVIDER_SITE_OTHER): Payer: BLUE CROSS/BLUE SHIELD | Admitting: Endocrinology

## 2015-04-02 ENCOUNTER — Encounter: Payer: Self-pay | Admitting: Endocrinology

## 2015-04-02 VITALS — BP 128/76 | HR 89 | Temp 98.1°F | Resp 14 | Ht 65.0 in | Wt 127.8 lb

## 2015-04-02 DIAGNOSIS — E1165 Type 2 diabetes mellitus with hyperglycemia: Secondary | ICD-10-CM | POA: Diagnosis not present

## 2015-04-02 DIAGNOSIS — IMO0002 Reserved for concepts with insufficient information to code with codable children: Secondary | ICD-10-CM

## 2015-04-02 DIAGNOSIS — E7800 Pure hypercholesterolemia, unspecified: Secondary | ICD-10-CM | POA: Insufficient documentation

## 2015-04-02 NOTE — Progress Notes (Signed)
[name], female   DOB: 1975/12/19, 39 y.o.   MRN: [id number]    Reason for Appointment: Diabetes follow-up   History of Present Illness   Diagnosis: date of diagnosis: 2009, initially asymptomatic, has type 2 diabetes.   PAST history:  She was apparently diagnosed to have diabetes by her gynecologist when she was complaining of excessive fatigue. She does not know what her initial blood sugar was but her A1c was 6.8. Before she started on medications her blood sugars were as high as 200+ after meals. She was started on medications after seeing a couple of different physicians and initially did well with 500 mg twice a day. In 2012 her A1c was relatively higher at 6.2 and although sugars are fairly good she was told to take metformin twice a day. This improved her blood sugars and her A1c was 5.9 in 12/12.   RECENT history:   Current treatment regimen: Insulin  NPH 5-7 at bedtime.  Janumet XR 100/1000 at dinner, Invokana 100 mg in a.m.  Because of poor control with taking 1 mg Amaryl, Janumet XR 100/1000 and Glumetza 500 mg she was started on bedtime NPH on 09/03/14.   Also Amaryl and Glumetza were stopped Because of inadequate control she was also given Invokana on 09/26/14.     Although her A1c was done at 7% it is slightly higher now at 7.3 She May not be checking blood sugars consistently especially after supper and also is now using different glucose monitor. Blood sugar patterns and problems identified:  Fasting blood sugars are variable but more recently usually below 140    she thinks blood sugar may be higher in the morning if she is eating liter meals are a late night snack    she will adjust her bedtime NPH based on what she is eating and will reduce the dose if she is eating less carbohydrate and fat.   has only one high significant reading of 228 after eating a brownie   she can do more readings after supper   she has done some exercise but not  consistent   Side effects from medications: Diarrhea with metfomin, 2 g doses. Monitors blood glucose: About 2 times a day a day  Glucometer: Contour.  Blood Glucose readings   PRE-MEAL Breakfast Lunch  afternoon Bedtime Overall  Glucose range:  88- 159  109  68- 112  111- 228   Mean/median:  132    150  125    Meals: 3 meals per day, vegetarian, tries to get protein like nuts, [name] yogurt, soy, fish, counts carbs. Dinner usually at 8 pm.  Sometimes will have  peanut butter sandwich for lunch Occasionally will have oatmeal at breakfast with some dairy product  Physical activity: exercise: Bike/weights /walking about  1-3 times a week  Dietician visit: Most recent: 03/2013  Difficulties with medication adherence: None  Wt Readings from Last 3 Encounters:  04/02/15 127 lb 12.8 oz (57.97 kg)  12/31/14 126 lb (57.153 kg)  10/30/14 127 lb 9.6 oz (57.879 kg)    LABS:  Lab Results  Component Value Date   HGBA1C 7.3* 03/27/2015   HGBA1C 7.0* 12/27/2014   HGBA1C 7.3* 08/29/2014   Lab Results  Component Value Date   MICROALBUR 0.7 03/27/2015   LDLCALC 118* 03/27/2015   CREATININE 0.85 03/27/2015      Lab on 03/27/2015  Component Date Value Ref Range Status  . Hgb A1c MFr Bld 03/27/2015 7.3* 4.6 - 6.5 %  Final   Glycemic Control Guidelines for People with Diabetes:Non Diabetic:  <6%Goal of Therapy: <7%Additional Action Suggested:  >8%   . Sodium 03/27/2015 137  135 - 145 mEq/L Final  . Potassium 03/27/2015 4.3  3.5 - 5.1 mEq/L Final  . Chloride 03/27/2015 103  96 - 112 mEq/L Final  . CO2 03/27/2015 25  19 - 32 mEq/L Final  . Glucose, Bld 03/27/2015 125* 70 - 99 mg/dL Final  . BUN 03/27/2015 14  6 - 23 mg/dL Final  . Creatinine, Ser 03/27/2015 0.85  0.40 - 1.20 mg/dL Final  . Calcium 03/27/2015 9.4  8.4 - 10.5 mg/dL Final  . GFR 03/27/2015 94.89  >60.00 mL/min Final  . Microalb, Ur 03/27/2015 0.7  0.0 - 1.9 mg/dL Final  . Creatinine,U 03/27/2015 156.1   Final  . Microalb  Creat Ratio 03/27/2015 0.4  0.0 - 30.0 mg/g Final  . Color, Urine 03/27/2015 YELLOW  Yellow;Lt. Yellow Final  . APPearance 03/27/2015 CLEAR  Clear Final  . Specific Gravity, Urine 03/27/2015 1.020  1.000-1.030 Final  . pH 03/27/2015 6.0  5.0 - 8.0 Final  . Total Protein, Urine 03/27/2015 NEGATIVE  Negative Final  . Urine Glucose 03/27/2015 >=1000* Negative Final  . Ketones, ur 03/27/2015 NEGATIVE  Negative Final  . Bilirubin Urine 03/27/2015 NEGATIVE  Negative Final  . Hgb urine dipstick 03/27/2015 TRACE-LYSED* Negative Final  . Urobilinogen, UA 03/27/2015 0.2  0.0 - 1.0 Final  . Leukocytes, UA 03/27/2015 NEGATIVE  Negative Final  . Nitrite 03/27/2015 NEGATIVE  Negative Final  . WBC, UA 03/27/2015 0-2/hpf  0-2/hpf Final  . RBC / HPF 03/27/2015 0-2/hpf  0-2/hpf Final  . Squamous Epithelial / LPF 03/27/2015 Rare(0-4/hpf)  Rare(0-4/hpf) Final  . Cholesterol 03/27/2015 215* 0 - 200 mg/dL Final   ATP III Classification       Desirable:  < 200 mg/dL               Borderline High:  200 - 239 mg/dL          High:  > = 240 mg/dL  . Triglycerides 03/27/2015 61.0  0.0 - 149.0 mg/dL Final   Normal:  <150 mg/dLBorderline High:  150 - 199 mg/dL  . HDL 03/27/2015 85.10  >39.00 mg/dL Final  . VLDL 03/27/2015 12.2  0.0 - 40.0 mg/dL Final  . LDL Cholesterol 03/27/2015 118* 0 - 99 mg/dL Final  . Total CHOL/HDL Ratio 03/27/2015 3   Final                  Men          Women1/2 Average Risk     3.4          3.3Average Risk          5.0          4.42X Average Risk          9.6          7.13X Average Risk          15.0          11.0                      . NonHDL 03/27/2015 129.90   Final   NOTE:  Non-HDL goal should be 30 mg/dL higher than patient's LDL goal (i.e. LDL goal of < 70 mg/dL, would have non-HDL goal of < 100 mg/dL)      Medication List  This list is accurate as of: 04/02/15  4:11 PM.  Always use your most recent med list.               blood glucose meter kit and supplies Kit  Use as  instructed to test blood sugar. DX: E11.09     canagliflozin 100 MG Tabs tablet  Commonly known as:  INVOKANA  Take 1 tablet (100 mg total) by mouth daily.     Chromium 200 MCG Tabs  Take 600 mcg by mouth daily.     EPINEPHrine 0.3 mg/0.3 mL Devi  Commonly known as:  EPIPEN  Inject 0.3 mLs (0.3 mg total) into the muscle once.     escitalopram 10 MG tablet  Commonly known as:  LEXAPRO     fluticasone 50 MCG/ACT nasal spray  Commonly known as:  FLONASE  Place into both nostrils daily.     glucose blood test strip  Commonly known as:  ONE TOUCH ULTRA TEST  Use as instructed to check blood sugar 3 times per day dx code E11.9     insulin NPH Human 100 UNIT/ML injection  Commonly known as:  NOVOLIN N  Use as directed, up to 10 units at bedtime     Insulin Pen Needle 32G X 4 MM Misc  Commonly known as:  BD PEN NEEDLE NANO U/F  Use as directed     INSULIN SYRINGE .3CC/31GX5/16" 31G X 5/16" 0.3 ML Misc  Use one per day     LOMEDIA 24 FE 1-20 MG-MCG(24) tablet  Generic drug:  Norethindrone Acetate-Ethinyl Estrad-FE     multivitamin with minerals Tabs tablet  Take 1 tablet by mouth daily.     ONE TOUCH ULTRA 2 W/DEVICE Kit  Use to check blood sugar 3 times per day dx code K81.2     ONETOUCH DELICA LANCETS FINE Misc  Use to check blood sugar 3 times per day, dx code E11.9     Saxagliptin-Metformin 03-999 MG Tb24  Take 1 tablet by mouth daily after supper.     STRATTERA 60 MG capsule  Generic drug:  atomoxetine     tacrolimus 0.1 % ointment  Commonly known as:  PROTOPIC  Apply 1 application topically 2 (two) times daily as needed (for hives).     vitamin B-12 1000 MCG tablet  Commonly known as:  CYANOCOBALAMIN  Take 1,000 mcg by mouth daily.        Allergies:  Allergies  Allergen Reactions  . Strawberry Hives    Facial hives and mouth hives  . Tomato Hives    Facial hives and mouth hives    Past Medical History  Diagnosis Date  . Diabetes mellitus   .  Back pain   . PCOS (polycystic ovarian syndrome)   . Fibroids     Past Surgical History  Procedure Laterality Date  . Uterine fibroid surgery    . Breast surgery      benign    Family History  Problem Relation Age of Onset  . Hyperlipidemia Other   . Hypertension Other   . Stroke Other   . Cancer Other   . Diabetes Maternal Uncle   . Heart disease Paternal Uncle   . Diabetes Maternal Grandmother   . Heart disease Maternal Grandmother   . Diabetes Paternal Grandmother     Social History:  reports that she has never smoked. She does not have any smokeless tobacco history on file. She reports that she does not drink alcohol or  use illicit drugs.  Review of Systems -  Has high lipids on this visit but previously LDL has been below 100. She does not have many risk factors and only mild family history of CAD She thinks she can cut back on cheeses  Lab Results  Component Value Date   CHOL 215* 03/27/2015   HDL 85.10 03/27/2015   LDLCALC 118* 03/27/2015   TRIG 61.0 03/27/2015   CHOLHDL 3 03/27/2015       Examination:   BP 128/76 mmHg  Pulse 89  Temp(Src) 98.1 F (36.7 C)  Resp 14  Ht $R'5\' 5"'fl$  (1.651 m)  Wt 127 lb 12.8 oz (57.97 kg)  BMI 21.27 kg/m2  SpO2 98%  Body mass index is 21.27 kg/(m^2).   Assesment:   DIABETES type 2:  The patient's diabetes control is not optimal with A1c higher than before at 7.3  in early 2015 she had been able to keep it below 7% However do not see any [place] where her blood sugars are higher at home and she is monitoring at various times    She can however monitor more after meals especially evening meal but also after other meals also. Also she can try to increase frequency of her exercise and she is planning to do so    Currently however is doing very well with NPH insulin at bedtime without hypoglycemia overnight and doing some variation in a dose based on type of meal in the evening per For now will increase her Invokana to  2 tablets daily and when she finishes her 90 day supply she can go to 300 mg   HYPERCHOLESTEROLEMIA: she will try to watch her saturated fats and recheck on the next visit  Patient Instructions  Take 2 Invokana in am, next Rx to be $Rem'300mg'KHdE$   Please check blood sugars at least half the time about 2 hours after any meal and 3 times per week on waking up. Please bring blood sugar monitor to each visit. Recommended blood sugar levels about 2 hours after meal is 140-180 and on waking up 90-130      Mauriana Dann 04/02/2015, 4:11 PM

## 2015-04-02 NOTE — Patient Instructions (Addendum)
Take 2 Invokana in am, next Rx to be 300mg   Please check blood sugars at least half the time about 2 hours after any meal and 3 times per week on waking up. Please bring blood sugar monitor to each visit. Recommended blood sugar levels about 2 hours after meal is 140-180 and on waking up 90-130

## 2015-06-28 ENCOUNTER — Other Ambulatory Visit: Payer: 59

## 2015-07-03 ENCOUNTER — Ambulatory Visit: Payer: 59 | Admitting: Endocrinology

## 2015-07-09 ENCOUNTER — Other Ambulatory Visit: Payer: Self-pay

## 2015-07-09 DIAGNOSIS — Z1231 Encounter for screening mammogram for malignant neoplasm of breast: Secondary | ICD-10-CM

## 2015-07-16 ENCOUNTER — Other Ambulatory Visit: Payer: Self-pay | Admitting: Endocrinology

## 2015-07-24 ENCOUNTER — Ambulatory Visit: Payer: 59

## 2015-08-06 ENCOUNTER — Other Ambulatory Visit: Payer: 59

## 2015-08-08 ENCOUNTER — Ambulatory Visit: Payer: 59 | Admitting: Endocrinology

## 2015-08-14 ENCOUNTER — Other Ambulatory Visit: Payer: 59

## 2015-08-16 ENCOUNTER — Ambulatory Visit: Payer: 59 | Admitting: Endocrinology

## 2015-08-30 ENCOUNTER — Other Ambulatory Visit: Payer: Self-pay | Admitting: *Deleted

## 2015-08-30 MED ORDER — DAPAGLIFLOZIN PROPANEDIOL 5 MG PO TABS: 5.0000 mg | ORAL_TABLET | Freq: Every day | ORAL | Status: DC

## 2015-08-30 MED ORDER — EMPAGLIFLOZIN 10 MG PO TABS: 10.0000 mg | ORAL_TABLET | Freq: Every day | ORAL | Status: DC

## 2015-09-02 ENCOUNTER — Telehealth: Payer: Self-pay | Admitting: Endocrinology

## 2015-09-02 NOTE — Telephone Encounter (Signed)
rx was sent in for Mia Wilson instead, tried calling patient back but there was no answer and her vm is full so I could not leave a message.

## 2015-09-02 NOTE — Telephone Encounter (Signed)
Patient need a prior Auth on medication Invokana.

## 2015-09-04 ENCOUNTER — Other Ambulatory Visit (INDEPENDENT_AMBULATORY_CARE_PROVIDER_SITE_OTHER): Payer: 59

## 2015-09-04 DIAGNOSIS — IMO0002 Reserved for concepts with insufficient information to code with codable children: Secondary | ICD-10-CM

## 2015-09-04 DIAGNOSIS — E1165 Type 2 diabetes mellitus with hyperglycemia: Secondary | ICD-10-CM | POA: Diagnosis not present

## 2015-09-04 LAB — BASIC METABOLIC PANEL
BUN: 9 mg/dL (ref 6–23)
CALCIUM: 9.6 mg/dL (ref 8.4–10.5)
CO2: 28 mEq/L (ref 19–32)
Chloride: 101 mEq/L (ref 96–112)
Creatinine, Ser: 0.7 mg/dL (ref 0.40–1.20)
GFR: 118.47 mL/min (ref >60.00)
Glucose, Bld: 84 mg/dL (ref 70–99)
Potassium: 4.2 mEq/L (ref 3.5–5.1)
Sodium: 137 mEq/L (ref 135–145)

## 2015-09-04 LAB — HEMOGLOBIN A1C: Hgb A1c MFr Bld: 6.7 % — ABNORMAL HIGH (ref 4.6–6.5)

## 2015-09-06 ENCOUNTER — Other Ambulatory Visit: Payer: Self-pay | Admitting: Endocrinology

## 2015-09-09 ENCOUNTER — Encounter: Payer: Self-pay | Admitting: Endocrinology

## 2015-09-09 ENCOUNTER — Ambulatory Visit (INDEPENDENT_AMBULATORY_CARE_PROVIDER_SITE_OTHER): Payer: 59 | Admitting: Endocrinology

## 2015-09-09 VITALS — BP 110/66 | HR 83 | Temp 98.3°F | Resp 14 | Ht 65.0 in | Wt 131.2 lb

## 2015-09-09 DIAGNOSIS — Z23 Encounter for immunization: Secondary | ICD-10-CM | POA: Diagnosis not present

## 2015-09-09 DIAGNOSIS — E119 Type 2 diabetes mellitus without complications: Secondary | ICD-10-CM | POA: Diagnosis not present

## 2015-09-09 DIAGNOSIS — E7800 Pure hypercholesterolemia, unspecified: Secondary | ICD-10-CM | POA: Diagnosis not present

## 2015-09-09 MED ORDER — SAXAGLIPTIN-METFORMIN ER 5-1000 MG PO TB24: ORAL_TABLET | ORAL | Status: DC

## 2015-09-09 NOTE — Patient Instructions (Signed)
Keep insulin in fridge  Take 5-6 units and go up if am sugars go >120  Check blood sugars on waking up ..3 .. times a week Also check blood sugars about 2 hours after a meal and do this after different meals by rotation Recommended blood sugar levels on waking up is 90-130 and about 2 hours after meal is 140-180 Please bring blood sugar monitor to each visit.  Exercise 4/7 days

## 2015-09-09 NOTE — Progress Notes (Signed)
[name], female   DOB: May 22, 1976, 39 y.o.   MRN: [id number]    Reason for Appointment: Diabetes follow-up   History of Present Illness   Diagnosis: date of diagnosis: 2009, initially asymptomatic, has type 2 diabetes.   PAST history:  She was apparently diagnosed to have diabetes by her gynecologist when she was complaining of excessive fatigue. She does not know what her initial blood sugar was but her A1c was 6.8. Before she started on medications her blood sugars were as high as 200+ after meals. She was started on medications after seeing a couple of different physicians and initially did well with 500 mg twice a day. In 2012 her A1c was relatively higher at 6.2 and although sugars are fairly good she was told to take metformin twice a day. This improved her blood sugars and her A1c was 5.9 in 12/12. Because of poor control with 1 mg Amaryl, Janumet XR 100/1000 and Glumetza 500 mg she started  bedtime NPH on 09/03/14.   Also Amaryl and Glumetza were stopped Because of inadequate control she was also given Invokana on 09/26/14.  RECENT history:   Current treatment regimen: Insulin  NPH 5-7 at bedtime.  Janumet XR 100/1000 at dinner, not taking Invokana 100 mg for 2 weeks  She has not been seen in follow-up for the last 5 months because of insurance issues     Although her A1c has usually been over 7% it is now better at 6.7  Blood sugar patterns, management and problems identified:  Fasting blood sugars are appearing to be better and fairly consistently near normal, high only once when she forgot her insulin at night; also no overnight hypoglycemia  She thinks that at the end of the month her insulin dose needs to be increased but she needs only 5 units when she first opens a new bottle.  Now using a syringe and vial because of insurance issues  She has not taken her Invokana in the last 2 weeks as it was not approved on her new insurance  She is  having some sporadic high readings after meals or snacks either in the late afternoon or at bedtime  However her overall median blood sugar for the last 2 weeks his only 101  She says that occasionally she is not able to watch her diet consistently and may eat a lot of snacks which causes high readings  She is not working recently and is trying to be more active overall although not doing as much formal exercise   No hypoglycemia   Side effects from medications: Diarrhea with metfomin, 2 g doses. Monitors blood glucose: About 2 times a day a day  Glucometer: One Touch .  Blood Glucose readings   Mean values apply above for all meters except median for One Touch  PRE-MEAL Fasting Lunch Dinner Bedtime Overall  Glucose range:  74-166   110, 117   74-289   79-209   74-289   Mean/median:  106     145   111    Meals: 3 meals per day, vegetarian, tries to get protein like nuts, [place] yogurt, soy, fish, counts carbs. Dinner usually at 8 pm.  Sometimes will have  peanut butter sandwich for lunch Occasionally will have oatmeal at breakfast with some dairy product   Physical activity: exercise: Biking /walking on and off   Dietician visit: Most recent: 03/2013  Difficulties with medication adherence: None  Wt Readings from Last 3  Encounters:  09/09/15 131 lb 3.2 oz (59.512 kg)  04/02/15 127 lb 12.8 oz (57.97 kg)  12/31/14 126 lb (57.153 kg)    LABS:  Lab Results  Component Value Date   HGBA1C 6.7* 09/04/2015   HGBA1C 7.3* 03/27/2015   HGBA1C 7.0* 12/27/2014   Lab Results  Component Value Date   MICROALBUR 0.7 03/27/2015   [place] 118* 03/27/2015   CREATININE 0.70 09/04/2015      Appointment on 09/04/2015  Component Date Value Ref Range Status  . Hgb A1c MFr Bld 09/04/2015 6.7* 4.6 - 6.5 % Final   Glycemic Control Guidelines for People with Diabetes:Non Diabetic:  <6%Goal of Therapy: <7%Additional Action Suggested:  >8%   . Sodium 09/04/2015 137  135 - 145 mEq/L Final  .  Potassium 09/04/2015 4.2  3.5 - 5.1 mEq/L Final  . Chloride 09/04/2015 101  96 - 112 mEq/L Final  . CO2 09/04/2015 28  19 - 32 mEq/L Final  . Glucose, Bld 09/04/2015 84  70 - 99 mg/dL Final  . BUN 09/04/2015 9  6 - 23 mg/dL Final  . Creatinine, Ser 09/04/2015 0.70  0.40 - 1.20 mg/dL Final  . Calcium 09/04/2015 9.6  8.4 - 10.5 mg/dL Final  . GFR 09/04/2015 118.47  >60.00 mL/min Final      Medication List       This list is accurate as of: 09/09/15  8:43 PM.  Always use your most recent med list.               blood glucose meter kit and supplies Kit  Use as instructed to test blood sugar. DX: E11.09     Chromium 200 MCG Tabs  Take 600 mcg by mouth daily.     dapagliflozin propanediol 5 MG Tabs tablet  Commonly known as:  FARXIGA  Take 5 mg by mouth daily.     EPINEPHrine 0.3 mg/0.3 mL Devi  Commonly known as:  EPIPEN  Inject 0.3 mLs (0.3 mg total) into the muscle once.     escitalopram 10 MG tablet  Commonly known as:  LEXAPRO     fluticasone 50 MCG/ACT nasal spray  Commonly known as:  FLONASE  Place into both nostrils daily.     glucose blood test strip  Commonly known as:  ONE TOUCH ULTRA TEST  Use as instructed to check blood sugar 3 times per day dx code E11.9     Insulin Pen Needle 32G X 4 MM Misc  Commonly known as:  BD PEN NEEDLE NANO U/F  Use as directed     LOMEDIA 24 FE 1-20 MG-MCG(24) tablet  Generic drug:  Norethindrone Acetate-Ethinyl Estrad-FE     multivitamin with minerals Tabs tablet  Take 1 tablet by mouth daily.     NOVOLIN N RELION 100 UNIT/ML injection  Generic drug:  insulin NPH Human  USE AS DIRECTED UP TO 10 UNITS AT BEDTIME     ONE TOUCH ULTRA 2 W/DEVICE Kit  Use to check blood sugar 3 times per day dx code N56.2     ONETOUCH DELICA LANCETS FINE Misc  Use to check blood sugar 3 times per day, dx code E11.9     RELION INSULIN SYR 0.3CC/30G 30G X 5/16" 0.3 ML Misc  Generic drug:  Insulin Syringe-Needle U-100  USE 1 PER DAY      Saxagliptin-Metformin 03-999 MG Tb24  Commonly known as:  KOMBIGLYZE XR  TAKE ONE TABLET BY MOUTH DAILY AFTER  SUPPER.  STRATTERA 60 MG capsule  Generic drug:  atomoxetine     tacrolimus 0.1 % ointment  Commonly known as:  PROTOPIC  Apply 1 application topically 2 (two) times daily as needed (for hives).     vitamin B-12 1000 MCG tablet  Commonly known as:  CYANOCOBALAMIN  Take 1,000 mcg by mouth daily.        Allergies:  Allergies  Allergen Reactions  . Strawberry Hives    Facial hives and mouth hives  . Tomato Hives    Facial hives and mouth hives    Past Medical History  Diagnosis Date  . Diabetes mellitus   . Back pain   . PCOS (polycystic ovarian syndrome)   . Fibroids     Past Surgical History  Procedure Laterality Date  . Uterine fibroid surgery    . Breast surgery      benign    Family History  Problem Relation Age of Onset  . Hyperlipidemia Other   . Hypertension Other   . Stroke Other   . Cancer Other   . Diabetes Maternal Uncle   . Heart disease Paternal Uncle   . Diabetes Maternal Grandmother   . Heart disease Maternal Grandmother   . Diabetes Paternal Grandmother     Social History:  reports that she has never smoked. She does not have any smokeless tobacco history on file. She reports that she does not drink alcohol or use illicit drugs.  Review of Systems -  Has high lipids at times, other times LDL has been below 100. She does not have many risk factors and only mild family history of CAD Was asked to cut back on saturated fat in cheeses   Lab Results  Component Value Date   CHOL 215* 03/27/2015   HDL 85.10 03/27/2015   LDLCALC 118* 03/27/2015   TRIG 61.0 03/27/2015   CHOLHDL 3 03/27/2015       Examination:   BP 110/66 mmHg  Pulse 83  Temp(Src) 98.3 F (36.8 C)  Resp 14  Ht $R'5\' 5"'jF$  (1.651 m)  Wt 131 lb 3.2 oz (59.512 kg)  BMI 21.83 kg/m2  SpO2 99%  Body mass index is 21.83 kg/(m^2).   Assesment:   DIABETES type  2:  The patient's diabetes control is appearing better with A1c 6.7 even though recently she has had some postprandial spikes in blood sugar See history of present illness for detailed discussion of his current management, blood sugar patterns and problems identified She also may have some tendency to high readings with not taking Invokana in the last 2 weeks However her fasting blood sugars are lower than before and frequently low normal without overnight hypoglycemia She thinks her insulin doesn't seem to be as potent at the end of the 30 days after opening a new bottle; this may be partly because of using smaller amounts and also not refrigerating her bottle Most likely she needs to continue insulin for now since she had a high reading once after skipping the dose  Recommendations today:  Keep insulin refrigerated and continue to use for up to 6 weeks or when the blood sugars started going up in the morning  Take 5-6 units only at bedtime, may cut back or stop if blood sugars are still low normal  Farxiga 5 mg daily instead of Invokana, this appears to be covered, co-pay card given  More formal exercise regularly  Call if blood sugars are not controlled   HYPERCHOLESTEROLEMIA:  recheck on the next  visit, no other risk factors to consider statin drug as yet  Counseling time on subjects discussed above is over 50% of today's 25 minute visit   Patient Instructions  Keep insulin in fridge  Take 5-6 units and go up if am sugars go >120  Check blood sugars on waking up ..3 .. times a week Also check blood sugars about 2 hours after a meal and do this after different meals by rotation Recommended blood sugar levels on waking up is 90-130 and about 2 hours after meal is 140-180 Please bring blood sugar monitor to each visit.  Exercise 4/7 days   influenza vaccine given   [facility] 09/09/2015, 8:43 PM   Note: This office note was prepared with Dragon voice recognition system  technology. Any transcriptional errors that result from this process are unintentional.

## 2015-10-21 ENCOUNTER — Telehealth: Payer: Self-pay | Admitting: Endocrinology

## 2015-10-21 ENCOUNTER — Other Ambulatory Visit: Payer: Self-pay | Admitting: *Deleted

## 2015-10-21 MED ORDER — CANAGLIFLOZIN 100 MG PO TABS: 100.0000 mg | ORAL_TABLET | Freq: Every day | ORAL | Status: DC

## 2015-10-21 NOTE — Telephone Encounter (Signed)
Pt spoke with insurance company please send rx for invokana to [facility]

## 2015-10-21 NOTE — Telephone Encounter (Signed)
Patient called stating that her insurance needs PA for her medication    Rx: [place]: [place] [phone]  Thank you

## 2015-10-21 NOTE — Telephone Encounter (Signed)
Rx sent 

## 2015-10-22 NOTE — Telephone Encounter (Signed)
PA sent through Cover my meds today.

## 2015-11-01 ENCOUNTER — Ambulatory Visit
Admission: RE | Admit: 2015-11-01 | Discharge: 2015-11-01 | Disposition: A | Discharge status: Home / Self Care | Payer: 59 | Source: Ambulatory Visit

## 2015-11-01 DIAGNOSIS — Z1231 Encounter for screening mammogram for malignant neoplasm of breast: Secondary | ICD-10-CM

## 2015-11-29 ENCOUNTER — Other Ambulatory Visit: Payer: Self-pay | Admitting: *Deleted

## 2015-11-29 ENCOUNTER — Telehealth: Payer: Self-pay | Admitting: Endocrinology

## 2015-11-29 MED ORDER — METFORMIN HCL ER 500 MG PO TB24
ORAL_TABLET | ORAL | Status: DC
Start: 2015-11-29 — End: 2016-04-08

## 2015-11-29 MED ORDER — GLIPIZIDE ER 5 MG PO TB24: 5.0000 mg | ORAL_TABLET | Freq: Every day | ORAL | Status: DC

## 2015-11-29 NOTE — Telephone Encounter (Signed)
She can take metformin ER 2000 mg a day along with glipizide ER 5 mg daily.  Has she tried using the co-pay card for Invokana?

## 2015-11-29 NOTE — Telephone Encounter (Signed)
The invokana copay card doesn't work so please call in the other rx

## 2015-11-29 NOTE — Telephone Encounter (Signed)
Noted, patient is aware, rx sent 

## 2015-11-29 NOTE — Telephone Encounter (Signed)
Patient stated she do not have any insurance, and she need her medication, Invokana, and Kombiglyze is there another alternative, something affordable that will keep her levels normal. Please advise

## 2015-11-29 NOTE — Telephone Encounter (Signed)
rx's have already been sent.

## 2015-11-29 NOTE — Telephone Encounter (Signed)
Please see below and advise.

## 2016-01-07 ENCOUNTER — Other Ambulatory Visit: Payer: 59

## 2016-01-10 ENCOUNTER — Ambulatory Visit: Payer: 59 | Admitting: Endocrinology

## 2016-03-28 ENCOUNTER — Other Ambulatory Visit: Payer: Self-pay | Admitting: Endocrinology

## 2016-04-03 ENCOUNTER — Other Ambulatory Visit (INDEPENDENT_AMBULATORY_CARE_PROVIDER_SITE_OTHER): Payer: Self-pay

## 2016-04-03 ENCOUNTER — Other Ambulatory Visit: Payer: Self-pay | Admitting: Endocrinology

## 2016-04-03 DIAGNOSIS — E119 Type 2 diabetes mellitus without complications: Secondary | ICD-10-CM

## 2016-04-03 DIAGNOSIS — E1165 Type 2 diabetes mellitus with hyperglycemia: Secondary | ICD-10-CM

## 2016-04-03 LAB — BASIC METABOLIC PANEL
BUN: 9 mg/dL (ref 6–23)
CO2: 24 mEq/L (ref 19–32)
CREATININE: 0.82 mg/dL (ref 0.40–1.20)
Calcium: 9.4 mg/dL (ref 8.4–10.5)
Chloride: 99 mEq/L (ref 96–112)
GFR: 98.42 mL/min (ref >60.00)
Glucose, Bld: 369 mg/dL — ABNORMAL HIGH (ref 70–99)
POTASSIUM: 4.3 meq/L (ref 3.5–5.1)
Sodium: 135 mEq/L (ref 135–145)

## 2016-04-03 LAB — MICROALBUMIN / CREATININE URINE RATIO
Creatinine,U: 93.5 mg/dL
Microalb Creat Ratio: 1 mg/g (ref 0.0–30.0)
Microalb, Ur: 0.9 mg/dL (ref 0.0–1.9)

## 2016-04-03 LAB — HEMOGLOBIN A1C: HEMOGLOBIN A1C: 11.2 % — AB (ref 4.6–6.5)

## 2016-04-03 LAB — LIPID PANEL
CHOL/HDL RATIO: 3
Cholesterol: 208 mg/dL — ABNORMAL HIGH (ref 0–200)
HDL: 71.3 mg/dL (ref >39.00)
LDL CALC: 123 mg/dL — AB (ref 0–99)
NonHDL: 136.43
TRIGLYCERIDES: 69 mg/dL (ref 0.0–149.0)
VLDL: 13.8 mg/dL (ref 0.0–40.0)

## 2016-04-06 ENCOUNTER — Other Ambulatory Visit: Payer: Self-pay | Admitting: *Deleted

## 2016-04-06 MED ORDER — DAPAGLIFLOZIN PROPANEDIOL 5 MG PO TABS: 5.0000 mg | ORAL_TABLET | Freq: Every day | ORAL | Status: DC

## 2016-04-08 ENCOUNTER — Encounter: Payer: Self-pay | Admitting: Endocrinology

## 2016-04-08 ENCOUNTER — Other Ambulatory Visit: Payer: Self-pay | Admitting: *Deleted

## 2016-04-08 ENCOUNTER — Ambulatory Visit (INDEPENDENT_AMBULATORY_CARE_PROVIDER_SITE_OTHER): Payer: Self-pay | Admitting: Endocrinology

## 2016-04-08 VITALS — BP 120/74 | HR 79 | Temp 97.7°F | Resp 14 | Ht 65.0 in | Wt 127.8 lb

## 2016-04-08 DIAGNOSIS — E7800 Pure hypercholesterolemia, unspecified: Secondary | ICD-10-CM

## 2016-04-08 DIAGNOSIS — E1165 Type 2 diabetes mellitus with hyperglycemia: Secondary | ICD-10-CM | POA: Diagnosis not present

## 2016-04-08 MED ORDER — GLUCOSE BLOOD VI STRP: ORAL_STRIP | Status: DC

## 2016-04-08 MED ORDER — METFORMIN HCL ER 500 MG PO TB24: ORAL_TABLET | ORAL | Status: DC

## 2016-04-08 MED ORDER — INSULIN ISOPHANE HUMAN 100 UNIT/ML KWIKPEN: PEN_INJECTOR | SUBCUTANEOUS | Status: DC

## 2016-04-08 NOTE — Progress Notes (Addendum)
[name], female   DOB: May 11, 1976, 40 y.o.   MRN: [id number]    Reason for Appointment: Diabetes follow-up   History of Present Illness   Diagnosis: date of diagnosis: 2009, initially asymptomatic, has type 2 diabetes.   PAST history:  She was apparently diagnosed to have diabetes by her gynecologist when she was complaining of excessive fatigue. She does not know what her initial blood sugar was but her A1c was 6.8. Before she started on medications her blood sugars were as high as 200+ after meals. She was started on medications after seeing a couple of different physicians and initially did well with 500 mg twice a day. In 2012 her A1c was relatively higher at 6.2 and although sugars are fairly good she was told to take metformin twice a day. This improved her blood sugars and her A1c was 5.9 in 12/12. Because of poor control with 1 mg Amaryl, Janumet XR 100/1000 and Glumetza 500 mg she started  bedtime NPH on 09/03/14.   Also Amaryl and Glumetza were stopped Because of inadequate control she was also given Invokana on 09/26/14.  RECENT history:   She has not been seen in follow-up since 10/16 mostly because of not having insurance coverage Her blood sugars are markedly increased with A1c 11.2 compared to 6.7 previously  Current treatment regimen: none   Blood sugar patterns, management and problems identified:  She was taking Invokana with metformin until about February with fair control of her sugars at home but they were still at times going up to around 190  She now says that she was able to tolerate metformin ER with taking 2000 mg at night with only mild loose stools in the morning  Subsequently she stopped taking her sugars  When she was not able to afford Invokana and [place] was not covered she was taking metformin only until about 2 weeks ago  Over the last month she has had increased thirst and urination especially at night and she is  feeling much more tired  She has lost about 4 pounds  She was not taking her sugar readings since January or so and did not think about doing this as she thought her symptoms were related to other issues  She thinks overall she is eating fairly healthy diet and not excessive amounts of carbohydrates now  She is working recently  She has tried to walk fairly regularly  Side effects from medications: minimal Monitors blood glucose: About 2 times a day a day  Glucometer: One Touch .  Blood Glucose readings as above  Meals: 3 meals per day, vegetarian, tries to get protein like nuts, [name] yogurt, soy, fish, counts carbs. Dinner usually at 8 pm.  Sometimes will have  peanut butter sandwich for lunch Occasionally will have oatmeal at breakfast with some dairy product   Physical activity: exercise: Biking Duanne Guess  Dietician visit: Most recent: 03/2013    Wt Readings from Last 3 Encounters:  04/08/16 127 lb 12.8 oz (57.97 kg)  09/09/15 131 lb 3.2 oz (59.512 kg)  04/02/15 127 lb 12.8 oz (57.97 kg)    LABS:  Lab Results  Component Value Date   HGBA1C 11.2* 04/03/2016   HGBA1C 6.7* 09/04/2015   HGBA1C 7.3* 03/27/2015   Lab Results  Component Value Date   MICROALBUR 0.9 04/03/2016   LDLCALC 123* 04/03/2016   CREATININE 0.82 04/03/2016      Lab on 04/03/2016  Component Date Value Ref Range Status  .  Hgb A1c MFr Bld 04/03/2016 11.2* 4.6 - 6.5 % Final   Glycemic Control Guidelines for People with Diabetes:Non Diabetic:  <6%Goal of Therapy: <7%Additional Action Suggested:  >8%   . Sodium 04/03/2016 135  135 - 145 mEq/L Final  . Potassium 04/03/2016 4.3  3.5 - 5.1 mEq/L Final  . Chloride 04/03/2016 99  96 - 112 mEq/L Final  . CO2 04/03/2016 24  19 - 32 mEq/L Final  . Glucose, Bld 04/03/2016 369* 70 - 99 mg/dL Final  . BUN 04/03/2016 9  6 - 23 mg/dL Final  . Creatinine, Ser 04/03/2016 0.82  0.40 - 1.20 mg/dL Final  . Calcium 04/03/2016 9.4  8.4 - 10.5 mg/dL Final  . GFR  04/03/2016 98.42  >60.00 mL/min Final  . Microalb, Ur 04/03/2016 0.9  0.0 - 1.9 mg/dL Final  . Creatinine,U 04/03/2016 93.5   Final  . Microalb Creat Ratio 04/03/2016 1.0  0.0 - 30.0 mg/g Final  . Cholesterol 04/03/2016 208* 0 - 200 mg/dL Final   ATP III Classification       Desirable:  < 200 mg/dL               Borderline High:  200 - 239 mg/dL          High:  > = 240 mg/dL  . Triglycerides 04/03/2016 69.0  0.0 - 149.0 mg/dL Final   Normal:  <150 mg/dLBorderline High:  150 - 199 mg/dL  . HDL 04/03/2016 71.30  >39.00 mg/dL Final  . VLDL 04/03/2016 13.8  0.0 - 40.0 mg/dL Final  . LDL Cholesterol 04/03/2016 123* 0 - 99 mg/dL Final  . Total CHOL/HDL Ratio 04/03/2016 3   Final                  Men          Women1/2 Average Risk     3.4          3.3Average Risk          5.0          4.42X Average Risk          9.6          7.13X Average Risk          15.0          11.0                      . NonHDL 04/03/2016 136.43   Final   NOTE:  Non-HDL goal should be 30 mg/dL higher than patient's LDL goal (i.e. LDL goal of < 70 mg/dL, would have non-HDL goal of < 100 mg/dL)      Medication List       This list is accurate as of: 04/08/16  9:42 AM.  Always use your most recent med list.               blood glucose meter kit and supplies Kit  Use as instructed to test blood sugar. DX: E11.09     Chromium 200 MCG Tabs  Take 600 mcg by mouth daily. Reported on 04/08/2016     dapagliflozin propanediol 5 MG Tabs tablet  Commonly known as:  FARXIGA  Take 5 mg by mouth daily.     EPINEPHrine 0.3 mg/0.3 mL Devi  Commonly known as:  EPIPEN  Inject 0.3 mLs (0.3 mg total) into the muscle once.     escitalopram 10 MG tablet  Commonly known as:  LEXAPRO  fluticasone 50 MCG/ACT nasal spray  Commonly known as:  FLONASE  Place into both nostrils daily. Reported on 04/08/2016     glipiZIDE 5 MG 24 hr tablet  Commonly known as:  GLUCOTROL XL  Take 1 tablet (5 mg total) by mouth daily with breakfast.       glucose blood test strip  Commonly known as:  BAYER CONTOUR TEST  Use as instructed to check blood sugar 2 times a day     Insulin NPH (Human) (Isophane) 100 UNIT/ML Kiwkpen  Commonly known as:  HUMULIN N  Inject 6 units at breakfast and 12 units at bedtime.     Insulin Pen Needle 32G X 4 MM Misc  Commonly known as:  BD PEN NEEDLE NANO U/F  Use as directed     LOMEDIA 24 FE 1-20 MG-MCG(24) tablet  Generic drug:  Norethindrone Acetate-Ethinyl Estrad-FE     metFORMIN 500 MG 24 hr tablet  Commonly known as:  GLUCOPHAGE XR  Take 4 tablets (2,000) mg daily     multivitamin with minerals Tabs tablet  Take 1 tablet by mouth daily.     ONE TOUCH ULTRA 2 w/Device Kit  Use to check blood sugar 3 times per day dx code Q22.2     ONETOUCH DELICA LANCETS FINE Misc  Use to check blood sugar 3 times per day, dx code E11.9     RELION INSULIN SYR 0.3CC/30G 30G X 5/16" 0.3 ML Misc  Generic drug:  Insulin Syringe-Needle U-100  USE 1 PER DAY     STRATTERA 60 MG capsule  Generic drug:  atomoxetine  Reported on 04/08/2016     tacrolimus 0.1 % ointment  Commonly known as:  PROTOPIC  Apply 1 application topically 2 (two) times daily as needed (for hives).     vitamin B-12 1000 MCG tablet  Commonly known as:  CYANOCOBALAMIN  Take 1,000 mcg by mouth daily.        Allergies:  Allergies  Allergen Reactions  . Other Anaphylaxis    Peaches , tomatoes, strawberry  . Strawberry Extract Hives    Facial hives and mouth hives  . Tomato Hives    Facial hives and mouth hives    Past Medical History  Diagnosis Date  . Diabetes mellitus   . Back pain   . PCOS (polycystic ovarian syndrome)   . Fibroids     Past Surgical History  Procedure Laterality Date  . Uterine fibroid surgery    . Breast surgery      benign    Family History  Problem Relation Age of Onset  . Hyperlipidemia Other   . Hypertension Other   . Stroke Other   . Cancer Other   . Diabetes Maternal Uncle   .  Heart disease Paternal Uncle   . Diabetes Maternal Grandmother   . Heart disease Maternal Grandmother   . Diabetes Paternal Grandmother     Social History:  reports that she has never smoked. She does not have any smokeless tobacco history on file. She reports that she does not drink alcohol or use illicit drugs.  Review of Systems -  Has high lipids at times, other times LDL has been below 100. Was asked to cut back on saturated fat she is usually doing this LDL is now 123  Lab Results  Component Value Date   CHOL 208* 04/03/2016   HDL 71.30 04/03/2016   LDLCALC 123* 04/03/2016   TRIG 69.0 04/03/2016   CHOLHDL 3 04/03/2016  Examination:   BP 120/74 mmHg  Pulse 79  Temp(Src) 97.7 F (36.5 C)  Resp 14  Ht _0  (1.651 m)  Wt 127 lb 12.8 oz (57.97 kg)  BMI 21.27 kg/m2  SpO2 97%  Body mass index is 21.27 kg/(m^2).   Assesment:   DIABETES type 2:  The patient's diabetes control is much worse with A1c 11.2 and fasting glucose 369 See history of present illness for detailed discussion of his current management, blood sugar patterns and problems identified  Although she has been on minimal treatment her blood sugars are appearing much harder controlled now and likely has increased insulin deficiency with symptomatic hyperglycemia  Recommendations today:  Start NPH insulin twice a day with the insulin pen.  Since she tends to have the highest readings fasting will benefit from bedtime NPH  She can try 6 units in the morning and 12 at bedtime.  Explained to her how to use a flowsheet to titrate her at time dose 1 unit every 3 days to get morning sugars below 130  Resume metformin ER and workup to 1500 mg  Continue Farxiga, consider increasing the dose to 10 mg  Start checking blood sugar consistently, 2-3 times a day including some readings after meals especially evening meals   HYPERCHOLESTEROLEMIA:  She is reluctant to start a statin drug but discussed  potential for doing this in the futureif her LDL continues to stay high  Counseling time on subjects discussed above is over 50% of today's 25 minute visit   Patient Instructions  Check blood sugars on waking up   times a week Also check blood sugars about 2 hours after a meal and do this after different meals by rotation  Recommended blood sugar levels on waking up is 90-130 and about 2 hours after meal is 130-160  Please bring your blood sugar monitor to each visit, thank you  Met    [facility] 04/08/2016, 9:42 AM   Note: This office note was prepared with Dragon voice recognition system technology. Any transcriptional errors that result from this process are unintentional.

## 2016-04-08 NOTE — Patient Instructions (Addendum)
Check blood sugars on waking up   times a week Also check blood sugars about 2 hours after a meal and do this after different meals by rotation  Recommended blood sugar levels on waking up is 90-130 and about 2 hours after meal is 130-160  Please bring your blood sugar monitor to each visit, thank you  Met

## 2016-04-29 ENCOUNTER — Ambulatory Visit (INDEPENDENT_AMBULATORY_CARE_PROVIDER_SITE_OTHER): Payer: BC Managed Care – PPO | Admitting: Endocrinology

## 2016-04-29 VITALS — BP 100/60 | HR 72 | Temp 98.9°F | Resp 12 | Wt 134.6 lb

## 2016-04-29 DIAGNOSIS — Z794 Long term (current) use of insulin: Secondary | ICD-10-CM | POA: Diagnosis not present

## 2016-04-29 DIAGNOSIS — E1165 Type 2 diabetes mellitus with hyperglycemia: Secondary | ICD-10-CM | POA: Diagnosis not present

## 2016-04-29 NOTE — Progress Notes (Signed)
[name], female   DOB: 11-Sep-1976, 40 y.o.   MRN: [id number]    Reason for Appointment: Diabetes follow-up   History of Present Illness   Diagnosis: date of diagnosis: 2009, initially asymptomatic, has type 2 diabetes.   PAST history:  She was apparently diagnosed to have diabetes by her gynecologist when she was complaining of excessive fatigue. She does not know what her initial blood sugar was but her A1c was 6.8. Before she started on medications her blood sugars were as high as 200+ after meals. She was started on medications after seeing a couple of different physicians and initially did well with 500 mg twice a day. In 2012 her A1c was relatively higher at 6.2 and although sugars are fairly good she was told to take metformin twice a day. This improved her blood sugars and her A1c was 5.9 in 12/12. Because of poor control with 1 mg Amaryl, Janumet XR 100/1000 and Glumetza 500 mg she started  bedtime NPH on 09/03/14.   Also Amaryl and Glumetza were stopped Because of inadequate control she was also given Invokana on 09/26/14.  RECENT history:   Her blood sugars in 5/17 were markedly increased with A1c 11.2 compared to 6.7 previously  Current treatment regimen:  Humulin N NPH 6 units a.m., 10 units at bedtime, metformin ER 1500 mg, Farxiga 5 mg daily  Blood sugar patterns, management and problems identified:  She was started on NPH insulin twice a day along with metformin when she came in for her significant hyperglycemia  Farxiga 5 mg daily was continued  Her blood sugars have improved dramatically and are fairly good overall with an average of 111  Her fasting readings are still mildly high  She feels subjectively much better  Although she is trying to be active she is not doing any significant exercise and has gained some weight  She has had a couple of episodes of low blood sugars around midnight or 2 AM and she did reduce her bedtime dose  from 12 down to 10 units about 3 days ago  Side effects from medications: minimal Monitors blood glucose: About 2 times a day a day  Glucometer: Contour.  Blood Glucose readings  Mean values apply above for all meters except median for One Touch  PRE-MEAL Fasting Lunch Dinner Bedtime Overall  Glucose range: 86-163  74, 92  78-175  47-169    Mean/median: 113     111    POST-MEAL PC Breakfast PC Lunch PC Dinner  Glucose range:  140    Mean/median:       Meals: 3 meals per day, vegetarian, tries to get protein like nuts, [name] yogurt, soy, fish, counts carbs. Dinner usually at 8 pm.  Sometimes will have  peanut butter sandwich for lunch No bedtime snack Occasionally will have oatmeal at breakfast with some dairy product   Physical activity: exercise: Biking /walking, stairs  Dietician visit: Most recent: 03/2013    Wt Readings from Last 3 Encounters:  04/29/16 134 lb 9.6 oz (61.054 kg)  04/08/16 127 lb 12.8 oz (57.97 kg)  09/09/15 131 lb 3.2 oz (59.512 kg)    LABS:  Lab Results  Component Value Date   HGBA1C 11.2* 04/03/2016   HGBA1C 6.7* 09/04/2015   HGBA1C 7.3* 03/27/2015   Lab Results  Component Value Date   MICROALBUR 0.9 04/03/2016   LDLCALC 123* 04/03/2016   CREATININE 0.82 04/03/2016      No visits with results  within 1 Week(s) from this visit. Latest known visit with results is:  Lab on 04/03/2016  Component Date Value Ref Range Status  . Hgb A1c MFr Bld 04/03/2016 11.2* 4.6 - 6.5 % Final   Glycemic Control Guidelines for People with Diabetes:Non Diabetic:  <6%Goal of Therapy: <7%Additional Action Suggested:  >8%   . Sodium 04/03/2016 135  135 - 145 mEq/L Final  . Potassium 04/03/2016 4.3  3.5 - 5.1 mEq/L Final  . Chloride 04/03/2016 99  96 - 112 mEq/L Final  . CO2 04/03/2016 24  19 - 32 mEq/L Final  . Glucose, Bld 04/03/2016 369* 70 - 99 mg/dL Final  . BUN 04/03/2016 9  6 - 23 mg/dL Final  . Creatinine, Ser 04/03/2016 0.82  0.40 - 1.20 mg/dL Final    . Calcium 04/03/2016 9.4  8.4 - 10.5 mg/dL Final  . GFR 04/03/2016 98.42  >60.00 mL/min Final  . Microalb, Ur 04/03/2016 0.9  0.0 - 1.9 mg/dL Final  . Creatinine,U 04/03/2016 93.5   Final  . Microalb Creat Ratio 04/03/2016 1.0  0.0 - 30.0 mg/g Final  . Cholesterol 04/03/2016 208* 0 - 200 mg/dL Final   ATP III Classification       Desirable:  < 200 mg/dL               Borderline High:  200 - 239 mg/dL          High:  > = 240 mg/dL  . Triglycerides 04/03/2016 69.0  0.0 - 149.0 mg/dL Final   Normal:  <150 mg/dLBorderline High:  150 - 199 mg/dL  . HDL 04/03/2016 71.30  >39.00 mg/dL Final  . VLDL 04/03/2016 13.8  0.0 - 40.0 mg/dL Final  . LDL Cholesterol 04/03/2016 123* 0 - 99 mg/dL Final  . Total CHOL/HDL Ratio 04/03/2016 3   Final                  Men          Women1/2 Average Risk     3.4          3.3Average Risk          5.0          4.42X Average Risk          9.6          7.13X Average Risk          15.0          11.0                      . NonHDL 04/03/2016 136.43   Final   NOTE:  Non-HDL goal should be 30 mg/dL higher than patient's LDL goal (i.e. LDL goal of < 70 mg/dL, would have non-HDL goal of < 100 mg/dL)      Medication List       This list is accurate as of: 04/29/16  4:13 PM.  Always use your most recent med list.               blood glucose meter kit and supplies Kit  Use as instructed to test blood sugar. DX: E11.09     dapagliflozin propanediol 5 MG Tabs tablet  Commonly known as:  FARXIGA  Take 5 mg by mouth daily.     escitalopram 10 MG tablet  Commonly known as:  LEXAPRO     glucose blood test strip  Commonly known as:  BAYER CONTOUR TEST  Use  as instructed to check blood sugar 2 times a day     Insulin NPH (Human) (Isophane) 100 UNIT/ML Kiwkpen  Commonly known as:  HUMULIN N  Inject 6 units at breakfast and 12 units at bedtime.     Insulin Pen Needle 32G X 4 MM Misc  Commonly known as:  BD PEN NEEDLE NANO U/F  Use as directed     LOMEDIA 24 FE 1-20  MG-MCG(24) tablet  Generic drug:  Norethindrone Acetate-Ethinyl Estrad-FE     metFORMIN 500 MG 24 hr tablet  Commonly known as:  GLUCOPHAGE XR  Take 4 tablets (2,000) mg daily     multivitamin with minerals Tabs tablet  Take 1 tablet by mouth daily.     ONETOUCH DELICA LANCETS FINE Misc  Use to check blood sugar 3 times per day, dx code E11.9     STRATTERA 60 MG capsule  Generic drug:  atomoxetine  Reported on 04/08/2016     tacrolimus 0.1 % ointment  Commonly known as:  PROTOPIC  Apply 1 application topically 2 (two) times daily as needed (for hives).     vitamin B-12 1000 MCG tablet  Commonly known as:  CYANOCOBALAMIN  Take 1,000 mcg by mouth daily.        Allergies:  Allergies  Allergen Reactions  . Other Anaphylaxis    Peaches , tomatoes, strawberry  . Strawberry Extract Hives    Facial hives and mouth hives  . Tomato Hives    Facial hives and mouth hives    Past Medical History  Diagnosis Date  . Diabetes mellitus   . Back pain   . PCOS (polycystic ovarian syndrome)   . Fibroids     Past Surgical History  Procedure Laterality Date  . Uterine fibroid surgery    . Breast surgery      benign    Family History  Problem Relation Age of Onset  . Hyperlipidemia Other   . Hypertension Other   . Stroke Other   . Cancer Other   . Diabetes Maternal Uncle   . Heart disease Paternal Uncle   . Diabetes Maternal Grandmother   . Heart disease Maternal Grandmother   . Diabetes Paternal Grandmother     Social History:  reports that she has never smoked. She does not have any smokeless tobacco history on file. She reports that she does not drink alcohol or use illicit drugs.  Review of Systems -  Has high lipids at times, other times LDL has been below 100. Was asked to cut back on saturated fat she is usually doing this LDL is now 123, Has been reluctant to start medications  Lab Results  Component Value Date   CHOL 208* 04/03/2016   HDL 71.30  04/03/2016   LDLCALC 123* 04/03/2016   TRIG 69.0 04/03/2016   CHOLHDL 3 04/03/2016       Examination:   BP 100/60 mmHg  Pulse 72  Temp(Src) 98.9 F (37.2 C) (Oral)  Resp 12  Wt 134 lb 9.6 oz (61.054 kg)  SpO2 95%  Body mass index is 22.4 kg/(m^2).   Standing blood pressure 100/68  Assesment:   DIABETES type 2:  See history of present illness for detailed discussion of his current management, blood sugar patterns and problems identified  With the current regimen of NPH insulin twice a day, Farxiga and metformin her blood sugars are significantly better Fasting blood sugars are still mildly increased but overall fairly good Not having significant postprandial hyperglycemia but  does tend to have occasional hypoglycemia with her bedtime NPH, less recently with reducing her dose She is trying to get some protein at each meal but not eating a bedtime snack  Recommendations today:  Reduce morning NPH to 5 units  Bedtime snack with some carbohydrate and protein also  Increase exercise  Call if blood sugars are not consistently controlled  May consider increasing Farxiga to 10 mg if she continues to gain weight   Patient Instructions  Reduce am dose to 5 Bedtime snack    Isabelle Matt 04/29/2016, 4:13 PM   Note: This office note was prepared with Dragon voice recognition system technology. Any transcriptional errors that result from this process are unintentional.

## 2016-04-29 NOTE — Patient Instructions (Addendum)
Reduce am dose to 5 Bedtime snack

## 2016-05-06 ENCOUNTER — Telehealth: Payer: Self-pay | Admitting: Endocrinology

## 2016-05-06 MED ORDER — INSULIN ISOPHANE HUMAN 100 UNIT/ML KWIKPEN: PEN_INJECTOR | SUBCUTANEOUS | Status: DC

## 2016-05-06 NOTE — Telephone Encounter (Signed)
Patient stated that she needed the pens that go on the, Insulin NPH, Human,, Isophane, (HUMULIN N) 100 UNIT/ML Kiwkpen send to  [id number] - [place], [place] - [place] [id number] (Phone) [phone] (Fax)

## 2016-05-06 NOTE — Telephone Encounter (Signed)
Rx submitted per pt's request.  

## 2016-05-07 ENCOUNTER — Other Ambulatory Visit: Payer: Self-pay

## 2016-05-07 MED ORDER — METFORMIN HCL ER 500 MG PO TB24: ORAL_TABLET | ORAL | Status: DC

## 2016-06-05 ENCOUNTER — Other Ambulatory Visit (INDEPENDENT_AMBULATORY_CARE_PROVIDER_SITE_OTHER): Payer: BC Managed Care – PPO

## 2016-06-05 DIAGNOSIS — E1165 Type 2 diabetes mellitus with hyperglycemia: Secondary | ICD-10-CM | POA: Diagnosis not present

## 2016-06-05 DIAGNOSIS — Z794 Long term (current) use of insulin: Secondary | ICD-10-CM | POA: Diagnosis not present

## 2016-06-05 LAB — BASIC METABOLIC PANEL
BUN: 10 mg/dL (ref 6–23)
CALCIUM: 8.9 mg/dL (ref 8.4–10.5)
CO2: 24 mEq/L (ref 19–32)
Chloride: 103 mEq/L (ref 96–112)
Creatinine, Ser: 0.71 mg/dL (ref 0.40–1.20)
GFR: 116.11 mL/min (ref >60.00)
GLUCOSE: 111 mg/dL — AB (ref 70–99)
POTASSIUM: 3.8 meq/L (ref 3.5–5.1)
Sodium: 137 mEq/L (ref 135–145)

## 2016-06-06 LAB — FRUCTOSAMINE: FRUCTOSAMINE: 317 umol/L — AB (ref 0–285)

## 2016-06-10 ENCOUNTER — Ambulatory Visit (INDEPENDENT_AMBULATORY_CARE_PROVIDER_SITE_OTHER): Payer: BC Managed Care – PPO | Admitting: Endocrinology

## 2016-06-10 ENCOUNTER — Encounter: Payer: Self-pay | Admitting: Endocrinology

## 2016-06-10 VITALS — BP 122/62 | HR 80 | Ht 65.0 in | Wt 136.0 lb

## 2016-06-10 DIAGNOSIS — Z794 Long term (current) use of insulin: Secondary | ICD-10-CM | POA: Diagnosis not present

## 2016-06-10 DIAGNOSIS — E1165 Type 2 diabetes mellitus with hyperglycemia: Secondary | ICD-10-CM

## 2016-06-10 MED ORDER — INSULIN DEGLUDEC 100 UNIT/ML ~~LOC~~ SOPN: 10.0000 [IU] | PEN_INJECTOR | Freq: Every day | SUBCUTANEOUS | Status: DC

## 2016-06-10 MED ORDER — INSULIN ASPART 100 UNIT/ML FLEXPEN: PEN_INJECTOR | SUBCUTANEOUS | Status: DC

## 2016-06-10 NOTE — Progress Notes (Signed)
[name], female   DOB: Apr 17, 1976, 40 y.o.   MRN: [id number]    Reason for Appointment: Diabetes follow-up   History of Present Illness   Diagnosis: date of diagnosis: 2009, initially asymptomatic, has type 2 diabetes.   PAST history:  She was apparently diagnosed to have diabetes by her gynecologist when she was complaining of excessive fatigue. She does not know what her initial blood sugar was but her A1c was 6.8. Before she started on medications her blood sugars were as high as 200+ after meals. She was started on medications after seeing a couple of different physicians and initially did well with 500 mg twice a day. In 2012 her A1c was relatively higher at 6.2 and although sugars are fairly good she was told to take metformin twice a day. This improved her blood sugars and her A1c was 5.9 in 12/12. Because of poor control with 1 mg Amaryl, Janumet XR 100/1000 and Glumetza 500 mg she started  bedtime NPH on 09/03/14.   Also Amaryl and Glumetza were stopped Because of inadequate control she was also given Invokana on 09/26/14.  RECENT history:   Her blood sugars in 5/17 were markedly increased with A1c 11.2 compared to 6.7 previously  Current treatment regimen:  Humulin N NPH 5 units a.m.,  10 units at bedtime, metformin ER 1500 mg, Farxiga 5 mg daily  Blood sugar patterns, management and problems identified:  She was started on NPH insulin twice a day along with metformin and metformin    although her blood sugars averaging 130 her fructosamine indicates overall high readings  However most of her high readings are after supper and these are are more consistent now  She is  Having reasonably good blood sugars during the day with taking NPH in the morning , lowest reading 57 at suppertime  Although she is trying to be active she has gained some weight  She has had a couple of episodes of low blood sugars around 2- 4 AM   Side effects from  medications: minimal Monitors blood glucose: About 2 times a day a day  Glucometer:  One Touch ultra 2.  Blood Glucose readings  Mean values apply above for all meters except median for One Touch  PRE-MEAL Fasting Lunch Dinner Bedtime Overall  Glucose range:   76-206   76-187  162-267  41-267  Mean/median:  119  81  93   119    overnight range 41-255   Meals: 3 meals per day, vegetarian, tries to get protein like nuts, [name] yogurt, soy, fish, counts carbs. Dinner usually at 8 pm.  Sometimes will have  peanut butter sandwich for lunch  Occasionally will have oatmeal at breakfast with some dairy product   Physical activity: exercise: Biking /walking, stairs  Dietician visit: Most recent: 03/2013    Wt Readings from Last 3 Encounters:  06/10/16 136 lb (61.689 kg)  04/29/16 134 lb 9.6 oz (61.054 kg)  04/08/16 127 lb 12.8 oz (57.97 kg)    LABS:  Lab Results  Component Value Date   HGBA1C 11.2* 04/03/2016   HGBA1C 6.7* 09/04/2015   HGBA1C 7.3* 03/27/2015   Lab Results  Component Value Date   MICROALBUR 0.9 04/03/2016   LDLCALC 123* 04/03/2016   CREATININE 0.71 06/05/2016      Lab on 06/05/2016  Component Date Value Ref Range Status  . Sodium 06/05/2016 137  135 - 145 mEq/L Final  . Potassium 06/05/2016 3.8  3.5 -  5.1 mEq/L Final  . Chloride 06/05/2016 103  96 - 112 mEq/L Final  . CO2 06/05/2016 24  19 - 32 mEq/L Final  . Glucose, Bld 06/05/2016 111* 70 - 99 mg/dL Final  . BUN 06/05/2016 10  6 - 23 mg/dL Final  . Creatinine, Ser 06/05/2016 0.71  0.40 - 1.20 mg/dL Final  . Calcium 06/05/2016 8.9  8.4 - 10.5 mg/dL Final  . GFR 06/05/2016 116.11  >60.00 mL/min Final  . Fructosamine 06/05/2016 317* 0 - 285 umol/L Final   Comment: Published reference interval for apparently healthy subjects between age 35 and 32 is 2 - 285 umol/L and in a poorly controlled diabetic population is 228 - 563 umol/L with a mean of 396 umol/L.       Medication List       This list  is accurate as of: 06/10/16  9:34 AM.  Always use your most recent med list.               blood glucose meter kit and supplies Kit  Use as instructed to test blood sugar. DX: E11.09     dapagliflozin propanediol 5 MG Tabs tablet  Commonly known as:  FARXIGA  Take 5 mg by mouth daily.     escitalopram 10 MG tablet  Commonly known as:  LEXAPRO     glucose blood test strip  Commonly known as:  BAYER CONTOUR TEST  Use as instructed to check blood sugar 2 times a day     Insulin NPH (Human) (Isophane) 100 UNIT/ML Kiwkpen  Commonly known as:  HUMULIN N  Inject 6 units at breakfast and 12 units at bedtime.     Insulin Pen Needle 32G X 4 MM Misc  Commonly known as:  BD PEN NEEDLE NANO U/F  Use as directed     LOMEDIA 24 FE 1-20 MG-MCG(24) tablet  Generic drug:  Norethindrone Acetate-Ethinyl Estrad-FE  Reported on 06/10/2016     metFORMIN 500 MG 24 hr tablet  Commonly known as:  GLUCOPHAGE XR  Take 4 tablets (2,000) mg daily     multivitamin with minerals Tabs tablet  Take 1 tablet by mouth daily.     ONETOUCH DELICA LANCETS FINE Misc  Use to check blood sugar 3 times per day, dx code E11.9     STRATTERA 60 MG capsule  Generic drug:  atomoxetine  Reported on 04/08/2016     tacrolimus 0.1 % ointment  Commonly known as:  PROTOPIC  Apply 1 application topically 2 (two) times daily as needed (for hives). Reported on 06/10/2016     vitamin B-12 1000 MCG tablet  Commonly known as:  CYANOCOBALAMIN  Take 1,000 mcg by mouth daily.        Allergies:  Allergies  Allergen Reactions  . Other Anaphylaxis    Peaches , tomatoes, strawberry  . Strawberry Extract Hives    Facial hives and mouth hives  . Tomato Hives    Facial hives and mouth hives    Past Medical History  Diagnosis Date  . Diabetes mellitus   . Back pain   . PCOS (polycystic ovarian syndrome)   . Fibroids     Past Surgical History  Procedure Laterality Date  . Uterine fibroid surgery    . Breast  surgery      benign    Family History  Problem Relation Age of Onset  . Hyperlipidemia Other   . Hypertension Other   . Stroke Other   . Cancer Other   .  Diabetes Maternal Uncle   . Heart disease Paternal Uncle   . Diabetes Maternal Grandmother   . Heart disease Maternal Grandmother   . Diabetes Paternal Grandmother     Social History:  reports that she has never smoked. She does not have any smokeless tobacco history on file. She reports that she does not drink alcohol or use illicit drugs.  Review of Systems -  Has high lipids at times, other times LDL has been below 100. Was asked to cut back on saturated fat she is usually doing this LDL is No ankle edema present fairly good overall  123, Has been reluctant to start medications  Lab Results  Component Value Date   CHOL 208* 04/03/2016   HDL 71.30 04/03/2016   LDLCALC 123* 04/03/2016   TRIG 69.0 04/03/2016   CHOLHDL 3 04/03/2016       Examination:   BP 122/62 mmHg  Pulse 80  Ht 5' 5" (1.651 m)  Wt 136 lb (61.689 kg)  BMI 22.63 kg/m2  SpO2 96%  Body mass index is 22.63 kg/(m^2).     Assesment:   DIABETES type 2:  See history of present illness for detailed discussion of his current management, blood sugar patterns and problems identified  With the current regimen of NPH insulin twice a day, Farxiga and metformin her blood sugars are fairly good but having fluctuation with tendency to some low sugars Overnight and occasionally in the afternoon Fasting blood sugars are near normal usually she is doing fairly well with her diet and exercise regimen  Fructosamine still indicates overall hyperglycemia, likely to be from high postprandial readings after supper  Recommendations today:  Change NPH to basal bolus regimen  Discussed in detail the regimen for a basal and bolus insulin separately and the actions of each type of insulin  She will start with 10 units of Tresiba in the evening and increase the dose  every 3 days by 1 unit until fasting readings are below 130 consistently.  She will use a flowsheet to adjust the dose and may need to decrease it if fasting readings are getting progressively lower  She will also start with at least 3-4 units of Novolog and adjust the dose based on anticipated meal size and carbohydrate intake.  Discussed postprandial blood sugar targets and an average of 150 would be ideal  She will also give checking some readings after breakfast and lunch  Call if blood sugars are not consistently controlled  May consider increasing Farxiga to 10 mg if she continues to gain weight   Patient Instructions  Tresiba insulin: This insulin provides blood sugar control for up to 24 hours.   Start with 10 units at nite daily and increase by 1 units every 3 days until the waking up sugars are under 130. Then continue the same dose. If blood sugar is under 90 for 2 days in a row, reduce the dose by 2 units. Note that this insulin does not control the rise of blood sugar with meals    Novolog 3-5 at dinner   Counseling time on subjects discussed above is over 50% of today's 25 minute visit   Mia Wilson 06/10/2016, 9:34 AM   Note: This office note was prepared with Estate agent. Any transcriptional errors that result from this process are unintentional.

## 2016-06-10 NOTE — Patient Instructions (Signed)
Mia Wilson insulin: This insulin provides blood sugar control for up to 24 hours.   Start with 10 units at nite daily and increase by 1 units every 3 days until the waking up sugars are under 130. Then continue the same dose. If blood sugar is under 90 for 2 days in a row, reduce the dose by 2 units. Note that this insulin does not control the rise of blood sugar with meals    Novolog 3-5 at dinner

## 2016-07-20 ENCOUNTER — Other Ambulatory Visit: Payer: BC Managed Care – PPO

## 2016-07-21 ENCOUNTER — Other Ambulatory Visit (INDEPENDENT_AMBULATORY_CARE_PROVIDER_SITE_OTHER): Payer: BC Managed Care – PPO

## 2016-07-21 DIAGNOSIS — E1165 Type 2 diabetes mellitus with hyperglycemia: Secondary | ICD-10-CM

## 2016-07-21 DIAGNOSIS — Z794 Long term (current) use of insulin: Secondary | ICD-10-CM

## 2016-07-21 LAB — BASIC METABOLIC PANEL
BUN: 14 mg/dL (ref 6–23)
CALCIUM: 9.1 mg/dL (ref 8.4–10.5)
CHLORIDE: 105 meq/L (ref 96–112)
CO2: 28 meq/L (ref 19–32)
CREATININE: 0.8 mg/dL (ref 0.40–1.20)
GFR: 101.11 mL/min (ref >60.00)
GLUCOSE: 117 mg/dL — AB (ref 70–99)
Potassium: 4.2 mEq/L (ref 3.5–5.1)
Sodium: 139 mEq/L (ref 135–145)

## 2016-07-21 LAB — HEMOGLOBIN A1C: HEMOGLOBIN A1C: 7 % — AB (ref 4.6–6.5)

## 2016-07-23 ENCOUNTER — Ambulatory Visit: Payer: BC Managed Care – PPO | Admitting: Endocrinology

## 2016-07-31 ENCOUNTER — Telehealth: Payer: Self-pay | Admitting: Endocrinology

## 2016-07-31 ENCOUNTER — Other Ambulatory Visit: Payer: Self-pay | Admitting: *Deleted

## 2016-07-31 MED ORDER — INSULIN PEN NEEDLE 32G X 4 MM MISC: 3 refills | Status: DC

## 2016-07-31 NOTE — Telephone Encounter (Signed)
Pt needs bd uf pen needles please call into [facility] on [address] [phone]

## 2016-08-09 ENCOUNTER — Other Ambulatory Visit: Payer: Self-pay | Admitting: Endocrinology

## 2016-08-13 ENCOUNTER — Ambulatory Visit (INDEPENDENT_AMBULATORY_CARE_PROVIDER_SITE_OTHER): Payer: BC Managed Care – PPO | Admitting: Endocrinology

## 2016-08-13 ENCOUNTER — Encounter: Payer: Self-pay | Admitting: Endocrinology

## 2016-08-13 VITALS — BP 118/82 | HR 78 | Temp 97.7°F | Resp 14 | Ht 65.0 in | Wt 139.0 lb

## 2016-08-13 DIAGNOSIS — Z794 Long term (current) use of insulin: Secondary | ICD-10-CM

## 2016-08-13 DIAGNOSIS — Z23 Encounter for immunization: Secondary | ICD-10-CM

## 2016-08-13 DIAGNOSIS — E1165 Type 2 diabetes mellitus with hyperglycemia: Secondary | ICD-10-CM

## 2016-08-13 NOTE — Progress Notes (Signed)
[name], female   DOB: 1976-09-26, 40 y.o.   MRN: [id number]    Reason for Appointment: Diabetes follow-up   History of Present Illness   Diagnosis: date of diagnosis: 2009, initially asymptomatic, has type 2 diabetes.   PAST history:  She was apparently diagnosed to have diabetes by her gynecologist when she was complaining of excessive fatigue. She does not know what her initial blood sugar was but her A1c was 6.8. Before she started on medications her blood sugars were as high as 200+ after meals. She was started on medications after seeing a couple of different physicians and initially did well with 500 mg twice a day. In 2012 her A1c was relatively higher at 6.2 and although sugars are fairly good she was told to take metformin twice a day. This improved her blood sugars and her A1c was 5.9 in 12/12. Because of poor control with 1 mg Amaryl, Janumet XR 100/1000 and Glumetza 500 mg she started  bedtime NPH on 09/03/14.   Also Amaryl and Glumetza were stopped Because of inadequate control she was also given Invokana on 09/26/14.  RECENT history:   Her blood sugars in 5/17 were markedly increased with A1c 11.2 compared to 6.7 previously because of stopping medications  A1c is now improved at 7%  Current treatment regimen:  Tresiba 8 units daily, NovoLog 3 units before meals, metformin ER 1500 mg, Farxiga 5 mg daily  Blood sugar patterns, management and problems identified:  She was started on Tresiba and Novolog instead of NPH twice a day in July but she did not come back for follow-up as directed  She has taken small doses of Tresiba at night but did not understand how to adjust this and even with low normal or low sugars early morning she has not reduced the dose  Also she forgets to check her blood sugars after meals especially supper since she goes to sleep soon after eating  However she has had markedly increased postprandial readings on 3 occasions  earlier this month at night which she does not remember  In the last 4 weeks she has had 3 documented readings below 70 early morning or at 9 AM and once this afternoon for a late lunch  Lowest reading was 56 at about 3 AM  She has done some walking but her weight his going up again  She tries to get some protein at most meals but not consistently, sometimes will have only pasta with some cheese  Lab glucose was 117  Side effects from medications: minimal Monitors blood glucose: About 2 times a day a day  Glucometer:  One Touch ultra 2.  Blood Glucose readings  Mean values apply above for all meters except median for One Touch  PRE-MEAL Fasting Lunch Dinner Bedtime Overall  Glucose range: 69-157  68-113   201-304    Mean/median: 106     108      overnight range 56, 64   Meals: 3 meals per day, vegetarian, tries to get protein like nuts, [name] yogurt, soy, fish, counts carbs. Dinner usually at 8 pm.Cheese sandwiich   Sometimes will have  peanut butter sandwich for lunch  Occasionally will have oatmeal at breakfast with some dairy product   Physical activity: exercise: walking, stairs  Dietician visit: Most recent: 03/2013    Wt Readings from Last 3 Encounters:  08/13/16 139 lb (63 kg)  06/10/16 136 lb (61.7 kg)  04/29/16 134 lb 9.6 oz (61.1  kg)    LABS:  Lab Results  Component Value Date   HGBA1C 7.0 (H) 07/21/2016   HGBA1C 11.2 (H) 04/03/2016   HGBA1C 6.7 (H) 09/04/2015   Lab Results  Component Value Date   MICROALBUR 0.9 04/03/2016   LDLCALC 123 (H) 04/03/2016   CREATININE 0.80 07/21/2016      No visits with results within 1 Week(s) from this visit.  Latest known visit with results is:  Lab on 07/21/2016  Component Date Value Ref Range Status  . Hgb A1c MFr Bld 07/21/2016 7.0* 4.6 - 6.5 % Final  . Sodium 07/21/2016 139  135 - 145 mEq/L Final  . Potassium 07/21/2016 4.2  3.5 - 5.1 mEq/L Final  . Chloride 07/21/2016 105  96 - 112 mEq/L Final  . CO2  07/21/2016 28  19 - 32 mEq/L Final  . Glucose, Bld 07/21/2016 117* 70 - 99 mg/dL Final  . BUN 07/21/2016 14  6 - 23 mg/dL Final  . Creatinine, Ser 07/21/2016 0.80  0.40 - 1.20 mg/dL Final  . Calcium 07/21/2016 9.1  8.4 - 10.5 mg/dL Final  . GFR 07/21/2016 101.11  >60.00 mL/min Final      Medication List       Accurate as of 08/13/16  9:27 PM. Always use your most recent med list.          blood glucose meter kit and supplies Kit Use as instructed to test blood sugar. DX: E11.09   DULoxetine 60 MG capsule Commonly known as:  CYMBALTA Take 60 mg by mouth daily.   escitalopram 10 MG tablet Commonly known as:  LEXAPRO   FARXIGA 5 MG Tabs tablet Generic drug:  dapagliflozin propanediol TAKE 1 TABLET BY MOUTH EVERY DAY **REPLACES JARDIANCE**   glucose blood test strip Commonly known as:  BAYER CONTOUR TEST Use as instructed to check blood sugar 2 times a day   insulin aspart 100 UNIT/ML FlexPen Commonly known as:  NOVOLOG 4 units before meals as directed   insulin degludec 100 UNIT/ML Sopn FlexTouch Pen Commonly known as:  TRESIBA FLEXTOUCH Inject 10 Units into the skin daily. Adjust the dose as directed to keep fasting glucose between 90-130   Insulin NPH (Human) (Isophane) 100 UNIT/ML Kiwkpen Commonly known as:  HUMULIN N Inject 6 units at breakfast and 12 units at bedtime.   Insulin Pen Needle 32G X 4 MM Misc Commonly known as:  BD PEN NEEDLE NANO U/F Use as directed   LOMEDIA 24 FE 1-20 MG-MCG(24) tablet Generic drug:  Norethindrone Acetate-Ethinyl Estrad-FE Reported on 06/10/2016   metFORMIN 500 MG 24 hr tablet Commonly known as:  GLUCOPHAGE XR Take 4 tablets (2,000) mg daily   multivitamin with minerals Tabs tablet Take 1 tablet by mouth daily.   ONETOUCH DELICA LANCETS FINE Misc Use to check blood sugar 3 times per day, dx code E11.9   STRATTERA 60 MG capsule Generic drug:  atomoxetine Reported on 04/08/2016   tacrolimus 0.1 % ointment Commonly  known as:  PROTOPIC Apply 1 application topically 2 (two) times daily as needed (for hives). Reported on 06/10/2016   vitamin B-12 1000 MCG tablet Commonly known as:  CYANOCOBALAMIN Take 1,000 mcg by mouth daily.       Allergies:  Allergies  Allergen Reactions  . Other Anaphylaxis    Peaches , tomatoes, strawberry  . Strawberry Extract Hives    Facial hives and mouth hives  . Tomato Hives    Facial hives and mouth hives  Past Medical History:  Diagnosis Date  . Back pain   . Diabetes mellitus   . Fibroids   . PCOS (polycystic ovarian syndrome)     Past Surgical History:  Procedure Laterality Date  . BREAST SURGERY     benign  . UTERINE FIBROID SURGERY      Family History  Problem Relation Age of Onset  . Hyperlipidemia Other   . Hypertension Other   . Stroke Other   . Cancer Other   . Diabetes Maternal Uncle   . Heart disease Paternal Uncle   . Diabetes Maternal Grandmother   . Heart disease Maternal Grandmother   . Diabetes Paternal Grandmother     Social History:  reports that she has never smoked. She does not have any smokeless tobacco history on file. She reports that she does not drink alcohol or use drugs.  Review of Systems -  Has high lipids at times, other times LDL has been below 100. Was asked to cut back on saturated fat   LDL  fairly good , Last  123, Has been reluctant to start medications  Lab Results  Component Value Date   CHOL 208 (H) 04/03/2016   HDL 71.30 04/03/2016   LDLCALC 123 (H) 04/03/2016   TRIG 69.0 04/03/2016   CHOLHDL 3 04/03/2016       Examination:   BP 118/82   Pulse 78   Temp 97.7 F (36.5 C)   Resp 14   Ht _0  (1.651 m)   Wt 139 lb (63 kg)   SpO2 97%   BMI 23.13 kg/m   Body mass index is 23.13 kg/m.     Assesment:   DIABETES type 2:  See history of present illness for detailed discussion of his current management, blood sugar patterns and problems identified  A1c is fairly good at 7% but her  sugars are not evenly controlled  Although her sugars are overall fairly good she has likely continue to have post prandial readings in the evenings with taking only 3 units of Novolog at suppertime Also not clear if she has high readings after other meals She is requiring relatively low amounts of basal insulin and even with 8 units of Tresiba she is getting hypoglycemic overnight She does not understand the adjustment of basal and bolus insulins and duration of action of mealtime insulin These facts were discussed in detail today  Recommendations today:  Change Tresiba dose to 6 units  Take at least 4-5 units of Novolog at suppertime and keep postprandial readings consistently below 180 at bedtime  More readings after other meals on days she is at home  More consistent exercise  May consider increasing Farxiga to 10 mg if she continues to gain weight   Patient Instructions  Tresiba 6 units  Novolog 4-5 units at supper and keep bedtime sugars <1 80   Counseling time on subjects discussed above is over 50% of today's 25 minute visit   Mia Wilson 08/13/2016, 9:27 PM   Note: This office note was prepared with Dragon voice recognition system technology. Any transcriptional errors that result from this process are unintentional.

## 2016-08-13 NOTE — Patient Instructions (Addendum)
Tresiba 6 units  Novolog 4-5 units at supper and keep bedtime sugars <1 80

## 2016-09-21 ENCOUNTER — Other Ambulatory Visit (INDEPENDENT_AMBULATORY_CARE_PROVIDER_SITE_OTHER): Payer: BC Managed Care – PPO

## 2016-09-21 DIAGNOSIS — E1165 Type 2 diabetes mellitus with hyperglycemia: Secondary | ICD-10-CM

## 2016-09-21 DIAGNOSIS — Z794 Long term (current) use of insulin: Secondary | ICD-10-CM

## 2016-09-21 LAB — GLUCOSE, RANDOM: GLUCOSE: 93 mg/dL (ref 70–99)

## 2016-09-22 LAB — FRUCTOSAMINE: Fructosamine: 318 umol/L — ABNORMAL HIGH (ref 0–285)

## 2016-09-24 ENCOUNTER — Encounter: Payer: Self-pay | Admitting: Endocrinology

## 2016-09-24 ENCOUNTER — Ambulatory Visit (INDEPENDENT_AMBULATORY_CARE_PROVIDER_SITE_OTHER): Payer: BC Managed Care – PPO | Admitting: Endocrinology

## 2016-09-24 VITALS — BP 105/70 | HR 84 | Wt 141.0 lb

## 2016-09-24 DIAGNOSIS — Z794 Long term (current) use of insulin: Secondary | ICD-10-CM

## 2016-09-24 DIAGNOSIS — E1165 Type 2 diabetes mellitus with hyperglycemia: Secondary | ICD-10-CM | POA: Diagnosis not present

## 2016-09-24 NOTE — Progress Notes (Signed)
[name], female   DOB: [birth year], 40 y.o.   MRN: [id number]    Reason for Appointment: Diabetes follow-up   History of Present Illness   Diagnosis: date of diagnosis: 2009, initially asymptomatic, has type 2 diabetes.   RECENT history:   A1c in August was improved at 7%  Current treatment regimen:  Tresiba 8 units daily, NovoLog 4 units before pm meals, metformin ER 1500 mg, Farxiga 5 mg daily  Blood sugar patterns, management and problems identified:  She was having high postprandial readings at night on the last visit and was supposed to take at least 4-5 units of Novolog for her evening meal but is taking only 4 units usually  Not adjusting this based on how much she is eating  Discusses some fluctuation in blood sugars at night but only one low blood sugar  Highest blood sugar was over 380 when she ate a whole can of beans  TRESIBA was supposed to be reduced to 6 units but she is continuing the same dose, however her blood sugars are not as low fasting as they were.  She has done a few readings in the afternoons and these are fairly good, did have a low blood sugar of 65 at 2 PM.  She usually has a small lunch  Lowest reading was 51 at about 5 AM  She has done some walking with counting steps but not enough, again her weight his going up again  She tries to get some protein at most meals but not consistently, sometimes will have only pasta with some cheese  Side effects from medications: minimal  Monitors blood glucose: About 2 times a day a day  Glucometer:  One Touch ultra 2.  Blood Glucose readings  Mean values apply above for all meters except median for One Touch  PRE-MEAL Fasting Lunch Afternoon  Bedtime Overall  Glucose range: 51-154  86-112  65-216  60-384    Mean/median: 112    162  116    Meals: 3 meals per day, vegetarian, tries to get protein like nuts, [name] yogurt, soy, fish, counts carbs. Dinner usually at 8  pm.Cheese sandwiich   Sometimes will have  peanut butter sandwich for lunch  Occasionally will have oatmeal at breakfast with some dairy product   Physical activity: exercise: walking, stairs During the day but getting only 5000 or 6000 steps per day  Dietician visit: Most recent: 03/2013    Wt Readings from Last 3 Encounters:  09/24/16 141 lb (64 kg)  08/13/16 139 lb (63 kg)  06/10/16 136 lb (61.7 kg)    LABS:  Lab Results  Component Value Date   HGBA1C 7.0 (H) 07/21/2016   HGBA1C 11.2 (H) 04/03/2016   HGBA1C 6.7 (H) 09/04/2015   Lab Results  Component Value Date   MICROALBUR 0.9 04/03/2016   LDLCALC 123 (H) 04/03/2016   CREATININE 0.80 07/21/2016      Lab on 09/21/2016  Component Date Value Ref Range Status  . Glucose, Bld 09/21/2016 93  70 - 99 mg/dL Final  . Fructosamine 09/22/2016 318* 0 - 285 umol/L Final   Comment: Published reference interval for apparently healthy subjects between age 50 and 27 is 24 - 285 umol/L and in a poorly controlled diabetic population is 228 - 563 umol/L with a mean of 396 umol/L.       Medication List       Accurate as of 09/24/16 10:40 AM. Always use your most recent  med list.          blood glucose meter kit and supplies Kit Use as instructed to test blood sugar. DX: E11.09   dapagliflozin propanediol 5 MG Tabs tablet Commonly known as:  FARXIGA Take 10 mg by mouth daily.   DULoxetine 60 MG capsule Commonly known as:  CYMBALTA Take 60 mg by mouth daily.   glucose blood test strip Commonly known as:  BAYER CONTOUR TEST Use as instructed to check blood sugar 2 times a day   insulin aspart 100 UNIT/ML FlexPen Commonly known as:  NOVOLOG 4 units before meals as directed   insulin degludec 100 UNIT/ML Sopn FlexTouch Pen Commonly known as:  TRESIBA FLEXTOUCH Inject 10 Units into the skin daily. Adjust the dose as directed to keep fasting glucose between 90-130   Insulin Pen Needle 32G X 4 MM Misc Commonly known  as:  BD PEN NEEDLE NANO U/F Use as directed   LOMEDIA 24 FE 1-20 MG-MCG(24) tablet Generic drug:  Norethindrone Acetate-Ethinyl Estrad-FE Reported on 06/10/2016   metFORMIN 500 MG 24 hr tablet Commonly known as:  GLUCOPHAGE XR Take 4 tablets (2,000) mg daily   multivitamin with minerals Tabs tablet Take 1 tablet by mouth daily.   tacrolimus 0.1 % ointment Commonly known as:  PROTOPIC Apply 1 application topically 2 (two) times daily as needed (for hives). Reported on 06/10/2016       Allergies:  Allergies  Allergen Reactions  . Other Anaphylaxis    Peaches , tomatoes, strawberry  . Strawberry Extract Hives    Facial hives and mouth hives  . Tomato Hives    Facial hives and mouth hives    Past Medical History:  Diagnosis Date  . Back pain   . Diabetes mellitus   . Fibroids   . PCOS (polycystic ovarian syndrome)     Past Surgical History:  Procedure Laterality Date  . BREAST SURGERY     benign  . UTERINE FIBROID SURGERY      Family History  Problem Relation Age of Onset  . Hyperlipidemia Other   . Hypertension Other   . Stroke Other   . Cancer Other   . Diabetes Maternal Uncle   . Heart disease Paternal Uncle   . Diabetes Maternal Grandmother   . Heart disease Maternal Grandmother   . Diabetes Paternal Grandmother     Social History:  reports that she has never smoked. She does not have any smokeless tobacco history on file. She reports that she does not drink alcohol or use drugs.  Review of Systems -  Has high lipids at times, other times LDL has been below 100.  LDL  Slightly higher than before, Last  123, Has been reluctant to start medications  Lab Results  Component Value Date   CHOL 208 (H) 04/03/2016   HDL 71.30 04/03/2016   LDLCALC 123 (H) 04/03/2016   TRIG 69.0 04/03/2016   CHOLHDL 3 04/03/2016       Examination:   BP 105/70   Pulse 84   Wt 141 lb (64 kg)   SpO2 98%   BMI 23.46 kg/m   Body mass index is 23.46 kg/m.      Assesment:   DIABETES type 2:  See history of present illness for detailed discussion of his current management, blood sugar patterns and problems identified  She is on low doses of insulin but mostly needing mealtime coverage at suppertime and only 8 units of Tresiba for basal insulin Not clear  why her fructosamine still rates higher at 318 Blood sugars averaging 132 at home  She is trying to be a little more active but has not done any formal exercise Variability in blood sugars after supper was based on her carbohydrate intake  Recommendations today:  Increased Farxiga to 10 mg, this should help with overall better blood sugars and maintaining her weight better  Adjust suppertime dose based on amount of carbohydrate on meal size, may take NovoLog right after eating if not sure how much she is eating   No change in [place] unless morning sugars are starting to get higher  Continue periodically checking blood sugars after breakfast and lunch She is planning to start exercise at the [facility]  Patient Instructions  [place] 2 daily   Counseling time on subjects discussed above is over 50% of today's 25 minute visit   Rossana Molchan 09/24/2016, 10:40 AM   Note: This office note was prepared with Estate agent. Any transcriptional errors that result from this process are unintentional.

## 2016-09-24 NOTE — Patient Instructions (Signed)
Farxiga 2 daily

## 2016-11-03 ENCOUNTER — Other Ambulatory Visit: Payer: Self-pay | Admitting: Endocrinology

## 2016-11-20 ENCOUNTER — Other Ambulatory Visit (INDEPENDENT_AMBULATORY_CARE_PROVIDER_SITE_OTHER): Payer: BC Managed Care – PPO

## 2016-11-20 DIAGNOSIS — Z794 Long term (current) use of insulin: Secondary | ICD-10-CM

## 2016-11-20 DIAGNOSIS — E1165 Type 2 diabetes mellitus with hyperglycemia: Secondary | ICD-10-CM | POA: Diagnosis not present

## 2016-11-20 LAB — LIPID PANEL
CHOL/HDL RATIO: 2
Cholesterol: 203 mg/dL — ABNORMAL HIGH (ref 0–200)
HDL: 81.7 mg/dL (ref >39.00)
LDL CALC: 105 mg/dL — AB (ref 0–99)
NonHDL: 121.67
TRIGLYCERIDES: 85 mg/dL (ref 0.0–149.0)
VLDL: 17 mg/dL (ref 0.0–40.0)

## 2016-11-20 LAB — BASIC METABOLIC PANEL
BUN: 10 mg/dL (ref 6–23)
CHLORIDE: 104 meq/L (ref 96–112)
CO2: 26 meq/L (ref 19–32)
CREATININE: 0.77 mg/dL (ref 0.40–1.20)
Calcium: 9.2 mg/dL (ref 8.4–10.5)
GFR: 105.5 mL/min (ref >60.00)
Glucose, Bld: 87 mg/dL (ref 70–99)
Potassium: 4.2 mEq/L (ref 3.5–5.1)
Sodium: 139 mEq/L (ref 135–145)

## 2016-11-20 LAB — HEMOGLOBIN A1C: Hgb A1c MFr Bld: 7.2 % — ABNORMAL HIGH (ref 4.6–6.5)

## 2016-11-24 ENCOUNTER — Ambulatory Visit (INDEPENDENT_AMBULATORY_CARE_PROVIDER_SITE_OTHER): Payer: BC Managed Care – PPO | Admitting: Endocrinology

## 2016-11-24 ENCOUNTER — Encounter: Payer: Self-pay | Admitting: Endocrinology

## 2016-11-24 VITALS — BP 136/59 | HR 81 | Ht 64.96 in | Wt 138.0 lb

## 2016-11-24 DIAGNOSIS — E1165 Type 2 diabetes mellitus with hyperglycemia: Secondary | ICD-10-CM

## 2016-11-24 DIAGNOSIS — Z794 Long term (current) use of insulin: Secondary | ICD-10-CM

## 2016-11-24 MED ORDER — ROPINIROLE HCL 0.25 MG PO TABS: 0.2500 mg | ORAL_TABLET | Freq: Every day | ORAL | 2 refills | Status: DC

## 2016-11-24 NOTE — Patient Instructions (Addendum)
More sugars after meals  Exercise daily

## 2016-11-24 NOTE — Progress Notes (Signed)
[name], female   DOB: July 30, 1976, 41 y.o.   MRN: [id number]    Reason for Appointment: Diabetes follow-up   History of Present Illness   Diagnosis: date of diagnosis: 2009, initially asymptomatic, has type 2 diabetes.   RECENT history:   A1c in December is 7.2, previously 7  Current treatment regimen:  Tresiba 8 units daily, NovoLog 3-4 units before pm meal, metformin ER 1500 mg, Farxiga 5 mg daily  Blood sugar patterns, management and problems identified:  She has not checked her blood sugars as directed and mostly before breakfast or lunch  She has only a few readings after supper and these are variable  She is not taking much insulin to cover her evening meal which she cannot cover consistently based on her diet  She is still vegetarian but tries to get some protein at meals, mostly dairy products  Highest blood sugar was over 380 when she ate a whole can of beans  TRESIBA dose has been the same, she is not getting low normal readings now with taking 8 units  She has done only one blood sugar after lunch, this was 291 and she probably took insulin postprandially causing her blood sugar to go down to 64  Lowest reading was 53 at 2 AM, this was because of taking at least 2 units of Novolog at bedtime the night before for blood sugar of 219  She has done some walking with counting steps but not consistently recently.  She says she does have access to a workout place in her apartment  Side effects from medications: minimal  Monitors blood glucose: About 2 times a day a day  Glucometer:  One Touch ultra 2.  Blood Glucose readings  Mean values apply above for all meters except median for One Touch  PRE-MEAL Fasting Lunch Dinner Bedtime Overall  Glucose range: 91-172  95-240  93-135  94-210    Mean/median: 120 103    112   POST-MEAL PC Breakfast PC Lunch PC Dinner  Glucose range:  291  114-230   Mean/median:       Meals: 3 meals per  day, vegetarian, tries to get protein like nuts, [name] yogurt, soy, fish, counts carbs.  Dinner usually at 7-8 pm.Cheese sandwiich, or vegetable soup with cheese, may have 1-2 starches per meal    Sometimes will have  peanut butter sandwich for lunch  Occasionally will have oatmeal at breakfast with some dairy product   Physical activity: exercise: walking, stairs, upto 10,000 steps per day, variable especially recently  Dietician visit: Most recent: 03/2013  CDE visit: 10/15    Wt Readings from Last 3 Encounters:  11/24/16 138 lb (62.6 kg)  09/24/16 141 lb (64 kg)  08/13/16 139 lb (63 kg)    LABS:  Lab Results  Component Value Date   HGBA1C 7.2 (H) 11/20/2016   HGBA1C 7.0 (H) 07/21/2016   HGBA1C 11.2 (H) 04/03/2016   Lab Results  Component Value Date   MICROALBUR 0.9 04/03/2016   LDLCALC 105 (H) 11/20/2016   CREATININE 0.77 11/20/2016      Lab on 11/20/2016  Component Date Value Ref Range Status  . Hgb A1c MFr Bld 11/20/2016 7.2* 4.6 - 6.5 % Final  . Sodium 11/20/2016 139  135 - 145 mEq/L Final  . Potassium 11/20/2016 4.2  3.5 - 5.1 mEq/L Final  . Chloride 11/20/2016 104  96 - 112 mEq/L Final  . CO2 11/20/2016 26  19 - 32  mEq/L Final  . Glucose, Bld 11/20/2016 87  70 - 99 mg/dL Final  . BUN 11/20/2016 10  6 - 23 mg/dL Final  . Creatinine, Ser 11/20/2016 0.77  0.40 - 1.20 mg/dL Final  . Calcium 11/20/2016 9.2  8.4 - 10.5 mg/dL Final  . GFR 11/20/2016 105.50  >60.00 mL/min Final  . Cholesterol 11/20/2016 203* 0 - 200 mg/dL Final  . Triglycerides 11/20/2016 85.0  0.0 - 149.0 mg/dL Final  . HDL 11/20/2016 81.70  >39.00 mg/dL Final  . VLDL 11/20/2016 17.0  0.0 - 40.0 mg/dL Final  . LDL Cholesterol 11/20/2016 105* 0 - 99 mg/dL Final  . Total CHOL/HDL Ratio 11/20/2016 2   Final  . NonHDL 11/20/2016 121.67   Final    Allergies as of 11/24/2016      Reactions   Other Anaphylaxis   Peaches , tomatoes, strawberry   Strawberry Extract Hives   Facial hives and mouth  hives   Tomato Hives   Facial hives and mouth hives      Medication List       Accurate as of 11/24/16  8:43 PM. Always use your most recent med list.          blood glucose meter kit and supplies Kit Use as instructed to test blood sugar. DX: E11.09   dapagliflozin propanediol 5 MG Tabs tablet Commonly known as:  FARXIGA Take 10 mg by mouth daily.   DULoxetine 60 MG capsule Commonly known as:  CYMBALTA Take 60 mg by mouth daily.   glucose blood test strip Commonly known as:  BAYER CONTOUR TEST Use as instructed to check blood sugar 2 times a day   insulin aspart 100 UNIT/ML FlexPen Commonly known as:  NOVOLOG 4 units before meals as directed   insulin degludec 100 UNIT/ML Sopn FlexTouch Pen Commonly known as:  TRESIBA FLEXTOUCH Inject 10 Units into the skin daily. Adjust the dose as directed to keep fasting glucose between 90-130   Insulin Pen Needle 32G X 4 MM Misc Commonly known as:  BD PEN NEEDLE NANO U/F Use as directed   LOMEDIA 24 FE 1-20 MG-MCG(24) tablet Generic drug:  Norethindrone Acetate-Ethinyl Estrad-FE Reported on 06/10/2016   metFORMIN 500 MG 24 hr tablet Commonly known as:  GLUCOPHAGE-XR TAKE 4 TABLETS BY MOUTH EVERY DAY   multivitamin with minerals Tabs tablet Take 1 tablet by mouth daily.   rOPINIRole 0.25 MG tablet Commonly known as:  REQUIP Take 1 tablet (0.25 mg total) by mouth at bedtime.       Allergies:  Allergies  Allergen Reactions  . Other Anaphylaxis    Peaches , tomatoes, strawberry  . Strawberry Extract Hives    Facial hives and mouth hives  . Tomato Hives    Facial hives and mouth hives    Past Medical History:  Diagnosis Date  . Back pain   . Diabetes mellitus   . Fibroids   . PCOS (polycystic ovarian syndrome)     Past Surgical History:  Procedure Laterality Date  . BREAST SURGERY     benign  . UTERINE FIBROID SURGERY      Family History  Problem Relation Age of Onset  . Hyperlipidemia Other   .  Hypertension Other   . Stroke Other   . Cancer Other   . Diabetes Maternal Uncle   . Heart disease Paternal Uncle   . Diabetes Maternal Grandmother   . Heart disease Maternal Grandmother   . Diabetes Paternal Grandmother  Social History:  reports that she has never smoked. She does not have any smokeless tobacco history on file. She reports that she does not drink alcohol or use drugs.  Review of Systems -  Has high lipids at times, other times LDL has been below 100.  LDL  Slightly above100 again, previously as high as 123   Lab Results  Component Value Date   CHOL 203 (H) 11/20/2016   HDL 81.70 11/20/2016   LDLCALC 105 (H) 11/20/2016   TRIG 85.0 11/20/2016   CHOLHDL 2 11/20/2016       Examination:   BP (!) 136/59   Pulse 81   Ht 5' 4.96" (1.65 m)   Wt 138 lb (62.6 kg)   SpO2 96%   BMI 22.99 kg/m   Body mass index is 22.99 kg/m.     Assesment:   DIABETES type 2:  See history of present illness for detailed discussion of  current management, blood sugar patterns and problems identified  She is on low doses of insulin but mostly has tendency to high postprandial readings which she does not monitor as much as she needs to needing mealtime coverage at suppertime and only Again with requiring 8 units of Tresiba for basal insulin she has good control of fasting readings without overnight hypoglycemia She will have some low sugars if she is taking postprandial correction doses or eating a lighter meal in the evening A1c is slightly higher at 7.2 Currently she is reluctant to take injectable insulin but considering the progression of her diabetes and her low body mass index she is likely insulin deficient  Variability in blood sugars after supper is again based on her carbohydrate intake and needs to be assessed more consistently  Recommendations today:  She will look into the freestyle Libre sensor for better assessment of her blood sugar patterns and she is  interested in this.  Discussed how this will help  No change in [place] as yet since her weight is stable  Consider GLP-1 drug  Adjust suppertime dose based on amount of carbohydrate on meal size  Also may need to take NovoLog if eating more carbohydrate at lunch and not being active  No change in [place] unless morning sugars are starting to get higher She is planning to start exercise more regularly or increase in her walking overall  Patient Instructions  More sugars after meals  Exercise daily Counseling time on subjects discussed above is over 50% of today's 25 minute visit   KUMAR,AJAY 11/24/2016, 8:43 PM   Note: This office note was prepared with Dragon voice recognition system technology. Any transcriptional errors that result from this process are unintentional.

## 2016-12-16 ENCOUNTER — Other Ambulatory Visit: Payer: Self-pay | Admitting: Endocrinology

## 2016-12-26 ENCOUNTER — Other Ambulatory Visit: Payer: Self-pay | Admitting: Endocrinology

## 2017-02-02 ENCOUNTER — Other Ambulatory Visit: Payer: Self-pay | Admitting: Obstetrics and Gynecology

## 2017-02-02 ENCOUNTER — Other Ambulatory Visit (HOSPITAL_COMMUNITY)
Admission: RE | Admit: 2017-02-02 | Discharge: 2017-02-02 | Disposition: A | Discharge status: Home / Self Care | Payer: BC Managed Care – PPO | Source: Ambulatory Visit | Attending: Obstetrics and Gynecology | Admitting: Obstetrics and Gynecology

## 2017-02-02 DIAGNOSIS — R87810 Cervical high risk human papillomavirus (HPV) DNA test positive: Secondary | ICD-10-CM | POA: Diagnosis present

## 2017-02-02 DIAGNOSIS — Z01411 Encounter for gynecological examination (general) (routine) with abnormal findings: Secondary | ICD-10-CM | POA: Insufficient documentation

## 2017-02-02 DIAGNOSIS — Z1151 Encounter for screening for human papillomavirus (HPV): Secondary | ICD-10-CM | POA: Diagnosis not present

## 2017-02-04 ENCOUNTER — Other Ambulatory Visit: Payer: Self-pay | Admitting: Endocrinology

## 2017-02-04 LAB — CYTOLOGY - PAP
Diagnosis: UNDETERMINED — AB
HPV (WINDOPATH): DETECTED — AB
HPV 16/18/45 GENOTYPING: POSITIVE — AB

## 2017-02-17 ENCOUNTER — Other Ambulatory Visit (INDEPENDENT_AMBULATORY_CARE_PROVIDER_SITE_OTHER): Payer: BC Managed Care – PPO

## 2017-02-17 DIAGNOSIS — Z794 Long term (current) use of insulin: Secondary | ICD-10-CM

## 2017-02-17 DIAGNOSIS — E1165 Type 2 diabetes mellitus with hyperglycemia: Secondary | ICD-10-CM | POA: Diagnosis not present

## 2017-02-17 LAB — BASIC METABOLIC PANEL
BUN: 9 mg/dL (ref 6–23)
CO2: 26 meq/L (ref 19–32)
Calcium: 9 mg/dL (ref 8.4–10.5)
Chloride: 104 mEq/L (ref 96–112)
Creatinine, Ser: 0.69 mg/dL (ref 0.40–1.20)
GFR: 119.6 mL/min (ref >60.00)
Glucose, Bld: 90 mg/dL (ref 70–99)
Potassium: 4.5 mEq/L (ref 3.5–5.1)
SODIUM: 138 meq/L (ref 135–145)

## 2017-02-17 LAB — HEMOGLOBIN A1C: HEMOGLOBIN A1C: 7 % — AB (ref 4.6–6.5)

## 2017-02-18 ENCOUNTER — Other Ambulatory Visit: Payer: Self-pay

## 2017-02-22 ENCOUNTER — Ambulatory Visit: Payer: Self-pay | Admitting: Endocrinology

## 2017-02-26 ENCOUNTER — Other Ambulatory Visit: Payer: Self-pay | Admitting: Obstetrics and Gynecology

## 2017-02-26 DIAGNOSIS — Z1231 Encounter for screening mammogram for malignant neoplasm of breast: Secondary | ICD-10-CM

## 2017-03-09 ENCOUNTER — Other Ambulatory Visit: Payer: Self-pay | Admitting: Obstetrics and Gynecology

## 2017-03-16 ENCOUNTER — Ambulatory Visit (INDEPENDENT_AMBULATORY_CARE_PROVIDER_SITE_OTHER): Payer: BC Managed Care – PPO | Admitting: Endocrinology

## 2017-03-16 ENCOUNTER — Encounter: Payer: Self-pay | Admitting: Endocrinology

## 2017-03-16 VITALS — BP 94/62 | HR 72 | Ht 65.0 in | Wt 137.0 lb

## 2017-03-16 DIAGNOSIS — Z794 Long term (current) use of insulin: Secondary | ICD-10-CM

## 2017-03-16 DIAGNOSIS — E1165 Type 2 diabetes mellitus with hyperglycemia: Secondary | ICD-10-CM

## 2017-03-16 DIAGNOSIS — E7800 Pure hypercholesterolemia, unspecified: Secondary | ICD-10-CM

## 2017-03-16 MED ORDER — FREESTYLE LIBRE READER DEVI: 1.0000 | 0 refills | Status: DC

## 2017-03-16 MED ORDER — FREESTYLE LIBRE SENSOR SYSTEM MISC: 3 refills | Status: DC

## 2017-03-16 NOTE — Progress Notes (Signed)
[name], female   DOB: 1976-04-07, 41 y.o.   MRN: [id number]    Reason for Appointment: Diabetes follow-up   History of Present Illness   Diagnosis: date of diagnosis: 2009, initially asymptomatic, has type 2 diabetes.   RECENT history:   A1c is now 7%, previously 7.2  Current treatment regimen:  Tresiba 8 units daily, NovoLog 3-4 units before pm meal, metformin ER 1500 mg, Farxiga 5 mg daily  Blood sugar patterns, management and problems identified:  She has not checked her blood sugars after meals very much and mostly in the mornings before breakfast  Currently does not appear to understand how to adjust her mealtime insulin and not paying attention to the amounts of carbohydrates before deciding on her insulin dose  FASTING blood sugars are mostly in the target range but occasionally higher  She is having sporadic readings over 200, once over 300 after meals, possibly from eating more carbohydrates and little or no NovoLog.  Also she is having sporadic readings in the 40s when she overestimates how much she is to take for her meal and will eat less carbohydrate  Occasionally may not get more than about 15-20 grams of carbohydrate at her evening meal when she takes Novolog  Carbohydrate intake at lunchtime is variable    Recently has not done any exercise for various reasons  Also had been off her metformin temporarily and gradually getting back on her 1500 mg dose because of GI problems  Side effects from medications: minimal  Monitors blood glucose: About 2 times a day a day  Glucometer:  One Touch ultra 2.  Blood Glucose readings  Mean values apply above for all meters except median for One Touch  PRE-MEAL Fasting Lunch Dinner Bedtime Overall  Glucose range: 79 - 161  1 21-221   42-3 21    Mean/median: 115     113+/-56    POST-MEAL PC Breakfast PC Lunch PC Dinner  Glucose range:  40-254    Mean/median:      Meals: 3 meals per  day, vegetarian, tries to get protein like nuts, [name] yogurt, soy, fish, counts carbs.  Dinner usually at 7-8 pm.Cheese sandwiich, or vegetable soup with cheese, may have 1-2 starches per meal    Sometimes will have  peanut butter sandwich for lunch  Occasionally will have oatmeal at breakfast with some dairy product   Physical activity: exercise: None, was walking stairs, upto 10,000 steps per day  Dietician visit: Most recent: 03/2013  CDE visit: 10/15    Wt Readings from Last 3 Encounters:  03/16/17 137 lb (62.1 kg)  11/24/16 138 lb (62.6 kg)  09/24/16 141 lb (64 kg)    LABS:  Lab Results  Component Value Date   HGBA1C 7.0 (H) 02/17/2017   HGBA1C 7.2 (H) 11/20/2016   HGBA1C 7.0 (H) 07/21/2016   Lab Results  Component Value Date   MICROALBUR 0.9 04/03/2016   LDLCALC 105 (H) 11/20/2016   CREATININE 0.69 02/17/2017      No visits with results within 1 Week(s) from this visit.  Latest known visit with results is:  Lab on 02/17/2017  Component Date Value Ref Range Status  . Hgb A1c MFr Bld 02/17/2017 7.0* 4.6 - 6.5 % Final  . Sodium 02/17/2017 138  135 - 145 mEq/L Final  . Potassium 02/17/2017 4.5  3.5 - 5.1 mEq/L Final  . Chloride 02/17/2017 104  96 - 112 mEq/L Final  . CO2 02/17/2017 26  19 - 32 mEq/L Final  . Glucose, Bld 02/17/2017 90  70 - 99 mg/dL Final  . BUN 02/17/2017 9  6 - 23 mg/dL Final  . Creatinine, Ser 02/17/2017 0.69  0.40 - 1.20 mg/dL Final  . Calcium 02/17/2017 9.0  8.4 - 10.5 mg/dL Final  . GFR 02/17/2017 119.60  >60.00 mL/min Final    Allergies as of 03/16/2017      Reactions   Other Anaphylaxis   Peaches , tomatoes, strawberry   Strawberry Extract Hives   Facial hives and mouth hives   Tomato Hives   Facial hives and mouth hives      Medication List       Accurate as of 03/16/17 10:30 AM. Always use your most recent med list.          blood glucose meter kit and supplies Kit Use as instructed to test blood sugar. DX: E11.09     dapagliflozin propanediol 5 MG Tabs tablet Commonly known as:  FARXIGA Take 10 mg by mouth daily.   FARXIGA 5 MG Tabs tablet Generic drug:  dapagliflozin propanediol TAKE 1 TABLET BY MOUTH EVERY DAY **REPLACES JARDIANCE**   DULoxetine 60 MG capsule Commonly known as:  CYMBALTA Take 60 mg by mouth daily.   fluconazole 150 MG tablet Commonly known as:  DIFLUCAN   FREESTYLE LIBRE READER Devi 1 Device by Does not apply route as directed.   [place] Misc Apply to upper arm and change sensor every 10 days   insulin aspart 100 UNIT/ML FlexPen Commonly known as:  NOVOLOG 4 units before meals as directed   Insulin Pen Needle 32G X 4 MM Misc Commonly known as:  BD PEN NEEDLE NANO U/F Use as directed   LOMEDIA 24 FE 1-20 MG-MCG(24) tablet Generic drug:  Norethindrone Acetate-Ethinyl Estrad-FE Reported on 06/10/2016   metFORMIN 500 MG 24 hr tablet Commonly known as:  GLUCOPHAGE-XR TAKE 4 TABLETS BY MOUTH EVERY DAY   multivitamin with minerals Tabs tablet Take 1 tablet by mouth daily.   ONE TOUCH ULTRA TEST test strip Generic drug:  glucose blood USE AS INSTRUCTED TO CHECK BLOOD SUGAR 2 TIMES A DAY   rOPINIRole 0.25 MG tablet Commonly known as:  REQUIP Take 1 tablet (0.25 mg total) by mouth at bedtime.   tranexamic acid 650 MG Tabs tablet Commonly known as:  LYSTEDA   TRESIBA FLEXTOUCH 100 UNIT/ML Sopn FlexTouch Pen Generic drug:  insulin degludec INJECT 10 UNITS INTO THE SKIN DAILY,ADJUST DOSE AS DIRECTED TO KEEP FASTING GLUCOSE BETWEEN 90-130       Allergies:  Allergies  Allergen Reactions  . Other Anaphylaxis    Peaches , tomatoes, strawberry  . Strawberry Extract Hives    Facial hives and mouth hives  . Tomato Hives    Facial hives and mouth hives    Past Medical History:  Diagnosis Date  . Back pain   . Diabetes mellitus   . Fibroids   . PCOS (polycystic ovarian syndrome)     Past Surgical History:  Procedure Laterality Date   . BREAST SURGERY     benign  . UTERINE FIBROID SURGERY      Family History  Problem Relation Age of Onset  . Hyperlipidemia Other   . Hypertension Other   . Stroke Other   . Cancer Other   . Diabetes Maternal Uncle   . Heart disease Paternal Uncle   . Diabetes Maternal Grandmother   . Heart disease Maternal Grandmother   .  Diabetes Paternal Grandmother     Social History:  reports that she has never smoked. She has never used smokeless tobacco. She reports that she does not drink alcohol or use drugs.  Review of Systems -  Has high lipids at times, other times LDL has been below 100.  LDL  Slightly above 100, previously as high as 123   Lab Results  Component Value Date   CHOL 203 (H) 11/20/2016   HDL 81.70 11/20/2016   LDLCALC 105 (H) 11/20/2016   TRIG 85.0 11/20/2016   CHOLHDL 2 11/20/2016       Examination:   BP 94/62   Pulse 72   Ht '5\' 5"'$  (1.651 m)   Wt 137 lb (62.1 kg)   BMI 22.80 kg/m   Body mass index is 22.8 kg/m.     Assesment:   DIABETES type 2:  See history of present illness for detailed discussion of  current management, blood sugar patterns and problems identified  A1c is 7.0 She is still taking low doses of insulin along with metformin and Farxiga Most of her mealtime Insulin is at suppertime although not clear if she sometimes needs coverage at lunch when she has more carbohydrate She is not able to look at carbohydrate closely and needs better understanding of carbohydrate counting Also not able to check blood sugars consistently after meals to help adjust the insulin doses  Recommendations today:  She will start using the freestyle Libre sensor and the prescription will be sent to the local drugstore  Adjust suppertime dose based on amount of carbohydrate and on meal size, discussed various sources of carbohydrate and she will try to estimate the grams also at the same time  She does not have to take any coverage for her evening  meal if she is eating less than 20 g of carbohydrate  Also may need to take NovoLog if eating more carbohydrate at lunch and not being active  No change in [place] unless morning sugars are starting to get higher  Once she is starting to use a FreeStyle sensor she should be able to better assess her insulin needs at mealtimes She is planning to start exercise and this will help also  Counseling time on subjects discussed above is over 50% of today's 25 minute visit    Amry Cathy 03/16/2017, 10:30 AM   Note: This office note was prepared with Estate agent. Any transcriptional errors that result from this process are unintentional.

## 2017-03-19 ENCOUNTER — Ambulatory Visit
Admission: RE | Admit: 2017-03-19 | Discharge: 2017-03-19 | Disposition: A | Discharge status: Home / Self Care | Payer: BC Managed Care – PPO | Source: Ambulatory Visit | Attending: Obstetrics and Gynecology | Admitting: Obstetrics and Gynecology

## 2017-03-19 DIAGNOSIS — Z1231 Encounter for screening mammogram for malignant neoplasm of breast: Secondary | ICD-10-CM

## 2017-04-02 ENCOUNTER — Other Ambulatory Visit: Payer: Self-pay | Admitting: Endocrinology

## 2017-04-03 ENCOUNTER — Other Ambulatory Visit: Payer: Self-pay | Admitting: Endocrinology

## 2017-04-28 ENCOUNTER — Other Ambulatory Visit: Payer: Self-pay | Admitting: Endocrinology

## 2017-04-30 ENCOUNTER — Other Ambulatory Visit: Payer: Self-pay | Admitting: Endocrinology

## 2017-05-06 ENCOUNTER — Other Ambulatory Visit: Payer: Self-pay | Admitting: Endocrinology

## 2017-07-13 ENCOUNTER — Other Ambulatory Visit (INDEPENDENT_AMBULATORY_CARE_PROVIDER_SITE_OTHER): Payer: BC Managed Care – PPO

## 2017-07-13 DIAGNOSIS — E1165 Type 2 diabetes mellitus with hyperglycemia: Secondary | ICD-10-CM | POA: Diagnosis not present

## 2017-07-13 DIAGNOSIS — Z794 Long term (current) use of insulin: Secondary | ICD-10-CM | POA: Diagnosis not present

## 2017-07-13 LAB — BASIC METABOLIC PANEL
BUN: 6 mg/dL (ref 6–23)
CALCIUM: 9.2 mg/dL (ref 8.4–10.5)
CO2: 28 mEq/L (ref 19–32)
Chloride: 103 mEq/L (ref 96–112)
Creatinine, Ser: 0.69 mg/dL (ref 0.40–1.20)
GFR: 119.38 mL/min (ref >60.00)
Glucose, Bld: 105 mg/dL — ABNORMAL HIGH (ref 70–99)
Potassium: 4.1 mEq/L (ref 3.5–5.1)
SODIUM: 137 meq/L (ref 135–145)

## 2017-07-13 LAB — MICROALBUMIN / CREATININE URINE RATIO
Creatinine,U: 92.4 mg/dL
MICROALB/CREAT RATIO: 1.1 mg/g (ref 0.0–30.0)
Microalb, Ur: 1 mg/dL (ref 0.0–1.9)

## 2017-07-13 LAB — HEMOGLOBIN A1C: HEMOGLOBIN A1C: 6.9 % — AB (ref 4.6–6.5)

## 2017-07-15 NOTE — Progress Notes (Deleted)
[name], female   DOB: 06-17-1976, 41 y.o.   MRN: [id number]    Reason for Appointment: Diabetes follow-up   History of Present Illness   Diagnosis: date of diagnosis: 2009, initially asymptomatic, has type 2 diabetes.   RECENT history:   A1c is now 7%, previously 7.2  Current treatment regimen:  Tresiba 8 units daily, NovoLog 3-4 units before pm meal, metformin ER 1500 mg, Farxiga 5 mg daily  Blood sugar patterns, management and problems identified:  She has not checked her blood sugars after meals very much and mostly in the mornings before breakfast  Currently does not appear to understand how to adjust her mealtime insulin and not paying attention to the amounts of carbohydrates before deciding on her insulin dose  FASTING blood sugars are mostly in the target range but occasionally higher  She is having sporadic readings over 200, once over 300 after meals, possibly from eating more carbohydrates and little or no NovoLog.  Also she is having sporadic readings in the 40s when she overestimates how much she is to take for her meal and will eat less carbohydrate  Occasionally may not get more than about 15-20 grams of carbohydrate at her evening meal when she takes Novolog  Carbohydrate intake at lunchtime is variable    Recently has not done any exercise for various reasons  Also had been off her metformin temporarily and gradually getting back on her 1500 mg dose because of GI problems  Side effects from medications: minimal  Monitors blood glucose: About 2 times a day a day  Glucometer:  One Touch ultra 2.  Blood Glucose readings  Mean values apply above for all meters except median for One Touch  PRE-MEAL Fasting Lunch Dinner Bedtime Overall  Glucose range: 79 - 161  1 21-221   42-3 21    Mean/median: 115     113+/-56    POST-MEAL PC Breakfast PC Lunch PC Dinner  Glucose range:  40-254    Mean/median:      Meals: 3 meals per  day, vegetarian, tries to get protein like nuts, [name] yogurt, soy, fish, counts carbs.  Dinner usually at 7-8 pm.Cheese sandwiich, or vegetable soup with cheese, may have 1-2 starches per meal    Sometimes will have  peanut butter sandwich for lunch  Occasionally will have oatmeal at breakfast with some dairy product   Physical activity: exercise: None, was walking stairs, upto 10,000 steps per day  Dietician visit: Most recent: 03/2013  CDE visit: 10/15    Wt Readings from Last 3 Encounters:  03/16/17 137 lb (62.1 kg)  11/24/16 138 lb (62.6 kg)  09/24/16 141 lb (64 kg)    LABS:  Lab Results  Component Value Date   HGBA1C 6.9 (H) 07/13/2017   HGBA1C 7.0 (H) 02/17/2017   HGBA1C 7.2 (H) 11/20/2016   Lab Results  Component Value Date   MICROALBUR 1.0 07/13/2017   LDLCALC 105 (H) 11/20/2016   CREATININE 0.69 07/13/2017      Lab on 07/13/2017  Component Date Value Ref Range Status  . Hgb A1c MFr Bld 07/13/2017 6.9* 4.6 - 6.5 % Final   Glycemic Control Guidelines for People with Diabetes:Non Diabetic:  <6%Goal of Therapy: <7%Additional Action Suggested:  >8%   . Sodium 07/13/2017 137  135 - 145 mEq/L Final  . Potassium 07/13/2017 4.1  3.5 - 5.1 mEq/L Final  . Chloride 07/13/2017 103  96 - 112 mEq/L Final  . CO2  07/13/2017 28  19 - 32 mEq/L Final  . Glucose, Bld 07/13/2017 105* 70 - 99 mg/dL Final  . BUN 07/13/2017 6  6 - 23 mg/dL Final  . Creatinine, Ser 07/13/2017 0.69  0.40 - 1.20 mg/dL Final  . Calcium 07/13/2017 9.2  8.4 - 10.5 mg/dL Final  . GFR 07/13/2017 119.38  >60.00 mL/min Final  . Microalb, Ur 07/13/2017 1.0  0.0 - 1.9 mg/dL Final  . Creatinine,U 07/13/2017 92.4  mg/dL Final  . Microalb Creat Ratio 07/13/2017 1.1  0.0 - 30.0 mg/g Final    Allergies as of 07/16/2017      Reactions   Other Anaphylaxis   Peaches , tomatoes, strawberry   Strawberry Extract Hives   Facial hives and mouth hives   Tomato Hives   Facial hives and mouth hives      Medication  List       Accurate as of 07/15/17  8:46 PM. Always use your most recent med list.          BD PEN NEEDLE NANO U/F 32G X 4 MM Misc Generic drug:  Insulin Pen Needle USE AS DIRECTED   blood glucose meter kit and supplies Kit Use as instructed to test blood sugar. DX: E11.09   dapagliflozin propanediol 5 MG Tabs tablet Commonly known as:  FARXIGA Take 10 mg by mouth daily.   FARXIGA 5 MG Tabs tablet Generic drug:  dapagliflozin propanediol TAKE 1 TABLET BY MOUTH EVERY DAY **REPLACES JARDIANCE**   FARXIGA 5 MG Tabs tablet Generic drug:  dapagliflozin propanediol TAKE 1 TABLET BY MOUTH EVERY DAY **REPLACES JARDIANCE**   DULoxetine 60 MG capsule Commonly known as:  CYMBALTA Take 60 mg by mouth daily.   fluconazole 150 MG tablet Commonly known as:  DIFLUCAN   FREESTYLE LIBRE READER Devi 1 Device by Does not apply route as directed.   [place] Misc Apply to upper arm and change sensor every 10 days   insulin aspart 100 UNIT/ML FlexPen Commonly known as:  NOVOLOG 4 units before meals as directed   LOMEDIA 24 FE 1-20 MG-MCG(24) tablet Generic drug:  Norethindrone Acetate-Ethinyl Estrad-FE Reported on 06/10/2016   metFORMIN 500 MG 24 hr tablet Commonly known as:  GLUCOPHAGE-XR TAKE 4 TABLETS BY MOUTH EVERY DAY   multivitamin with minerals Tabs tablet Take 1 tablet by mouth daily.   ONE TOUCH ULTRA TEST test strip Generic drug:  glucose blood USE AS INSTRUCTED TO CHECK BLOOD SUGAR 2 TIMES A DAY   rOPINIRole 0.25 MG tablet Commonly known as:  REQUIP Take 1 tablet (0.25 mg total) by mouth at bedtime.   tranexamic acid 650 MG Tabs tablet Commonly known as:  LYSTEDA   TRESIBA FLEXTOUCH 100 UNIT/ML Sopn FlexTouch Pen Generic drug:  insulin degludec INJECT 10 UNITS INTO THE SKIN DAILY,ADJUST DOSE AS DIRECTED TO KEEP FASTING GLUCOSE BETWEEN 90-130       Allergies:  Allergies  Allergen Reactions  . Other Anaphylaxis    Peaches , tomatoes,  strawberry  . Strawberry Extract Hives    Facial hives and mouth hives  . Tomato Hives    Facial hives and mouth hives    Past Medical History:  Diagnosis Date  . Back pain   . Diabetes mellitus   . Fibroids   . PCOS (polycystic ovarian syndrome)     Past Surgical History:  Procedure Laterality Date  . BREAST SURGERY     benign  . UTERINE FIBROID SURGERY  Family History  Problem Relation Age of Onset  . Hyperlipidemia Other   . Hypertension Other   . Stroke Other   . Cancer Other   . Diabetes Maternal Uncle   . Heart disease Paternal Uncle   . Diabetes Maternal Grandmother   . Heart disease Maternal Grandmother   . Diabetes Paternal Grandmother   . Breast cancer Neg Hx     Social History:  reports that she has never smoked. She has never used smokeless tobacco. She reports that she does not drink alcohol or use drugs.  Review of Systems -  Has high lipids at times, other times LDL has been below 100.  LDL  Slightly above 100, previously as high as 123   Lab Results  Component Value Date   CHOL 203 (H) 11/20/2016   HDL 81.70 11/20/2016   LDLCALC 105 (H) 11/20/2016   TRIG 85.0 11/20/2016   CHOLHDL 2 11/20/2016       Examination:   There were no vitals taken for this visit.  There is no height or weight on file to calculate BMI.     Assesment:   DIABETES type 2:  See history of present illness for detailed discussion of  current management, blood sugar patterns and problems identified  A1c is 7.0 She is still taking low doses of insulin along with metformin and Farxiga Most of her mealtime Insulin is at suppertime although not clear if she sometimes needs coverage at lunch when she has more carbohydrate She is not able to look at carbohydrate closely and needs better understanding of carbohydrate counting Also not able to check blood sugars consistently after meals to help adjust the insulin doses  Recommendations today:  She will start  using the freestyle Libre sensor and the prescription will be sent to the local drugstore  Adjust suppertime dose based on amount of carbohydrate and on meal size, discussed various sources of carbohydrate and she will try to estimate the grams also at the same time  She does not have to take any coverage for her evening meal if she is eating less than 20 g of carbohydrate  Also may need to take NovoLog if eating more carbohydrate at lunch and not being active  No change in [place] unless morning sugars are starting to get higher  Once she is starting to use a FreeStyle sensor she should be able to better assess her insulin needs at mealtimes She is planning to start exercise and this will help also  Counseling time on subjects discussed above is over 50% of today's 25 minute visit    Mia Wilson 07/15/2017, 8:46 PM   Note: This office note was prepared with Dragon voice recognition system technology. Any transcriptional errors that result from this process are unintentional.

## 2017-07-16 ENCOUNTER — Ambulatory Visit (INDEPENDENT_AMBULATORY_CARE_PROVIDER_SITE_OTHER): Payer: BC Managed Care – PPO | Admitting: Endocrinology

## 2017-07-16 ENCOUNTER — Encounter: Payer: Self-pay | Admitting: Endocrinology

## 2017-07-16 ENCOUNTER — Ambulatory Visit: Payer: Self-pay | Admitting: Endocrinology

## 2017-07-16 VITALS — BP 106/70 | HR 70 | Ht 65.0 in | Wt 138.6 lb

## 2017-07-16 DIAGNOSIS — Z794 Long term (current) use of insulin: Secondary | ICD-10-CM | POA: Diagnosis not present

## 2017-07-16 DIAGNOSIS — E7800 Pure hypercholesterolemia, unspecified: Secondary | ICD-10-CM

## 2017-07-16 DIAGNOSIS — E1165 Type 2 diabetes mellitus with hyperglycemia: Secondary | ICD-10-CM | POA: Diagnosis not present

## 2017-07-16 NOTE — Progress Notes (Signed)
[name], female   DOB: [birth year], 41 y.o.   MRN: [id number]    Reason for Appointment: Diabetes follow-up   History of Present Illness   Diagnosis: date of diagnosis: 2009, initially asymptomatic, has type 2 diabetes.   RECENT history:    A1c is now 6.9%, relatively better than before  Current treatment regimen:  Tresiba 8 units daily, NovoLog 3-4 units before pm meal, metformin ER 1500 mg, Farxiga 5 mg daily  Blood sugar patterns, management and problems identified:  She has not been seen in follow-up since April  Although she has done better with having her home glucose checked on the freestyle Libre sensor this has not been used consistently and her dad or does not have enough readings after evening meal  She still has difficulty with sporadic low blood sugars usually late at night and sometimes at bedtime  She now says that sugars maybe starting to get low more recently on the evenings that she does exercise although this occurs a few hours later  FASTING blood sugars are fairly good with using 8 units of Tresiba and recently averaging around 130  Blood sugars from the [place] are as much as 30 mg HIGHER than her fingersticks  With the [place] her blood sugars averaging only 118  Overall HIGHEST blood sugars are after evening meal but these are not consistent  He will know she is taking small amounts of NOVOLOG at suppertime she is not able to consistently get her readings after meals consistently  Occasionally with higher carbohydrate she may have readings over 200 after LUNCH also when she does not often take any Novolog coverage  Carbohydrate intake at lunchtime is variable   She thinks that certain foods like things make her blood sugar go higher  Side effects from medications: minimal  LIBRE results as follows: Overall average 118, LOWEST 85 between 6-8 AM and highest average 191 8-10 PM Significant variability after about  midnight Average after lunch 141  Glucometer:  One Touch ultra 2.  Blood Glucose readings from One Touch monitor:   PRE-MEAL Fasting Lunch Dinner Bedtime Overall  Glucose range:  76-247       Mean/median: 113     110    POST-MEAL PC Breakfast PC Lunch PC Dinner  Glucose range:    38-273   Mean/median:      Meals: 3 meals per day, vegetarian, tries to get protein like nuts, [name] yogurt, soy, fish, counts carbs.  Dinner usually at 7-8 pm.Cheese sandwiich, or vegetable soup with cheese, may have 1-2 starches per meal    Sometimes will have  peanut butter sandwich for lunch  Occasionally will have oatmeal at breakfast with some dairy product   Physical activity: exercise: 6 -pm, 3/7, was walking stairs, upto 10,000 steps per day  Dietician visit: Most recent: 03/2013  CDE visit: 10/15    Wt Readings from Last 3 Encounters:  07/16/17 138 lb 9.6 oz (62.9 kg)  03/16/17 137 lb (62.1 kg)  11/24/16 138 lb (62.6 kg)    LABS:  Lab Results  Component Value Date   HGBA1C 6.9 (H) 07/13/2017   HGBA1C 7.0 (H) 02/17/2017   HGBA1C 7.2 (H) 11/20/2016   Lab Results  Component Value Date   MICROALBUR 1.0 07/13/2017   LDLCALC 105 (H) 11/20/2016   CREATININE 0.69 07/13/2017      Lab on 07/13/2017  Component Date Value Ref Range Status  . Hgb A1c MFr Bld 07/13/2017 6.9* 4.6 -  6.5 % Final   Glycemic Control Guidelines for People with Diabetes:Non Diabetic:  <6%Goal of Therapy: <7%Additional Action Suggested:  >8%   . Sodium 07/13/2017 137  135 - 145 mEq/L Final  . Potassium 07/13/2017 4.1  3.5 - 5.1 mEq/L Final  . Chloride 07/13/2017 103  96 - 112 mEq/L Final  . CO2 07/13/2017 28  19 - 32 mEq/L Final  . Glucose, Bld 07/13/2017 105* 70 - 99 mg/dL Final  . BUN 07/13/2017 6  6 - 23 mg/dL Final  . Creatinine, Ser 07/13/2017 0.69  0.40 - 1.20 mg/dL Final  . Calcium 07/13/2017 9.2  8.4 - 10.5 mg/dL Final  . GFR 07/13/2017 119.38  >60.00 mL/min Final  . Microalb, Ur 07/13/2017 1.0  0.0  - 1.9 mg/dL Final  . Creatinine,U 07/13/2017 92.4  mg/dL Final  . Microalb Creat Ratio 07/13/2017 1.1  0.0 - 30.0 mg/g Final    Allergies as of 07/16/2017      Reactions   Other Anaphylaxis   Peaches , tomatoes, strawberry   Strawberry Extract Hives   Facial hives and mouth hives   Tomato Hives   Facial hives and mouth hives      Medication List       Accurate as of 07/16/17  2:12 PM. Always use your most recent med list.          BD PEN NEEDLE NANO U/F 32G X 4 MM Misc Generic drug:  Insulin Pen Needle USE AS DIRECTED   blood glucose meter kit and supplies Kit Use as instructed to test blood sugar. DX: E11.09   DULoxetine 60 MG capsule Commonly known as:  CYMBALTA Take 60 mg by mouth daily.   FARXIGA 5 MG Tabs tablet Generic drug:  dapagliflozin propanediol TAKE 1 TABLET BY MOUTH EVERY DAY **REPLACES JARDIANCE**   FREESTYLE LIBRE READER Devi 1 Device by Does not apply route as directed.   [place] Misc Apply to upper arm and change sensor every 10 days   FUSION PLUS PO Take 1 capsule by mouth daily. Takes for anemia   insulin aspart 100 UNIT/ML FlexPen Commonly known as:  NOVOLOG 4 units before meals as directed   metFORMIN 500 MG 24 hr tablet Commonly known as:  GLUCOPHAGE-XR TAKE 4 TABLETS BY MOUTH EVERY DAY   multivitamin with minerals Tabs tablet Take 1 tablet by mouth daily.   ONE TOUCH ULTRA TEST test strip Generic drug:  glucose blood USE AS INSTRUCTED TO CHECK BLOOD SUGAR 2 TIMES A DAY   tranexamic acid 650 MG Tabs tablet Commonly known as:  LYSTEDA   TRESIBA FLEXTOUCH 100 UNIT/ML Sopn FlexTouch Pen Generic drug:  insulin degludec INJECT 10 UNITS INTO THE SKIN DAILY,ADJUST DOSE AS DIRECTED TO KEEP FASTING GLUCOSE BETWEEN 90-130       Allergies:  Allergies  Allergen Reactions  . Other Anaphylaxis    Peaches , tomatoes, strawberry  . Strawberry Extract Hives    Facial hives and mouth hives  . Tomato Hives     Facial hives and mouth hives    Past Medical History:  Diagnosis Date  . Back pain   . Diabetes mellitus   . Fibroids   . PCOS (polycystic ovarian syndrome)     Past Surgical History:  Procedure Laterality Date  . BREAST SURGERY     benign  . UTERINE FIBROID SURGERY      Family History  Problem Relation Age of Onset  . Hyperlipidemia Other   . Hypertension  Other   . Stroke Other   . Cancer Other   . Diabetes Maternal Uncle   . Heart disease Paternal Uncle   . Diabetes Maternal Grandmother   . Heart disease Maternal Grandmother   . Diabetes Paternal Grandmother   . Breast cancer Neg Hx     Social History:  reports that she has never smoked. She has never used smokeless tobacco. She reports that she does not drink alcohol or use drugs.  Review of Systems -  Has high lipids at times, other times LDL has been below 100.  No recent follow-up available  Lab Results  Component Value Date   CHOL 203 (H) 11/20/2016   HDL 81.70 11/20/2016   LDLCALC 105 (H) 11/20/2016   TRIG 85.0 11/20/2016   CHOLHDL 2 11/20/2016       Examination:   BP 106/70   Pulse 70   Ht 5' 5" (1.651 m)   Wt 138 lb 9.6 oz (62.9 kg)   SpO2 99%   BMI 23.06 kg/m   Body mass index is 23.06 kg/m.     Assesment:   DIABETES type 2:  See history of present illness for detailed discussion of  current management, blood sugar patterns and problems identified  A1c is 6.9, previously 7.0 She is taking low doses of basal bolus insulin along with metformin ER 1500 mg and Farxiga 5 mg  Although her FreeStyle Libre sensor is not giving her accurate readings and is falsely low she is getting better idea how her blood sugars are ranging after different meals Most of her mealtime Insulin requirement is at suppertime but will have high readings after lunch if getting more carbohydrate also Fasting readings are fairly good with current regimen of Tresiba She is also starting exercise which is causing  variability and delayed hyperglycemia at night  Recommendations today:  She will reduce her TRESIBA 2 units on the evenings that she is exercising  She would take NovoLog consistently for lunchtime coverage  Most likely she needs only 3 units for usual meals at suppertime and only higher doses for about carbohydrate meals or eating out  She will continue to use the freestyle Libre with need for confirmation with fingersticks also when blood sugars are out of range   She needs to scan her blood sugar with her recently removed consistently at bedtime to get profile of her postprandial readings  Stay on 5 mg twice a day especially since her blood pressure is low normal  Review blood sugars again in 3 months  Counseling time on subjects discussed above is over 50% of today's 25 minute visit  Patient Instructions  Tresiba 6 on exercise days       , 07/16/2017, 2:12 PM   Note: This office note was prepared with Dragon voice recognition system technology. Any transcriptional errors that result from this process are unintentional.  

## 2017-07-16 NOTE — Patient Instructions (Signed)
Tresiba 6 on exercise days

## 2017-10-11 ENCOUNTER — Other Ambulatory Visit: Payer: Self-pay | Admitting: Obstetrics and Gynecology

## 2017-10-11 ENCOUNTER — Other Ambulatory Visit (INDEPENDENT_AMBULATORY_CARE_PROVIDER_SITE_OTHER): Payer: BC Managed Care – PPO

## 2017-10-11 DIAGNOSIS — E1165 Type 2 diabetes mellitus with hyperglycemia: Secondary | ICD-10-CM | POA: Diagnosis not present

## 2017-10-11 DIAGNOSIS — Z794 Long term (current) use of insulin: Secondary | ICD-10-CM | POA: Diagnosis not present

## 2017-10-11 DIAGNOSIS — Z1231 Encounter for screening mammogram for malignant neoplasm of breast: Secondary | ICD-10-CM

## 2017-10-11 LAB — BASIC METABOLIC PANEL
BUN: 13 mg/dL (ref 6–23)
CALCIUM: 9.8 mg/dL (ref 8.4–10.5)
CO2: 27 mEq/L (ref 19–32)
CREATININE: 0.65 mg/dL (ref 0.40–1.20)
Chloride: 99 mEq/L (ref 96–112)
GFR: 127.75 mL/min (ref >60.00)
Glucose, Bld: 130 mg/dL — ABNORMAL HIGH (ref 70–99)
Potassium: 4 mEq/L (ref 3.5–5.1)
SODIUM: 134 meq/L — AB (ref 135–145)

## 2017-10-11 LAB — LIPID PANEL
CHOL/HDL RATIO: 3
Cholesterol: 242 mg/dL — ABNORMAL HIGH (ref 0–200)
HDL: 95.5 mg/dL (ref >39.00)
LDL Cholesterol: 137 mg/dL — ABNORMAL HIGH (ref 0–99)
NONHDL: 146.41
Triglycerides: 47 mg/dL (ref 0.0–149.0)
VLDL: 9.4 mg/dL (ref 0.0–40.0)

## 2017-10-11 LAB — HEMOGLOBIN A1C: HEMOGLOBIN A1C: 7 % — AB (ref 4.6–6.5)

## 2017-10-19 ENCOUNTER — Ambulatory Visit (INDEPENDENT_AMBULATORY_CARE_PROVIDER_SITE_OTHER): Payer: BC Managed Care – PPO | Admitting: Endocrinology

## 2017-10-19 ENCOUNTER — Encounter: Payer: Self-pay | Admitting: Endocrinology

## 2017-10-19 VITALS — BP 118/70 | HR 85 | Ht 65.0 in | Wt 134.8 lb

## 2017-10-19 DIAGNOSIS — E1165 Type 2 diabetes mellitus with hyperglycemia: Secondary | ICD-10-CM

## 2017-10-19 DIAGNOSIS — Z794 Long term (current) use of insulin: Secondary | ICD-10-CM

## 2017-10-19 DIAGNOSIS — E7800 Pure hypercholesterolemia, unspecified: Secondary | ICD-10-CM | POA: Diagnosis not present

## 2017-10-19 NOTE — Progress Notes (Signed)
[name], female   DOB: March 10, 1976, 41 y.o.   MRN: [id number]    Reason for Appointment: Diabetes follow-up   History of Present Illness   Diagnosis: date of diagnosis: 2009, initially asymptomatic, has type 2 diabetes.   RECENT history:    A1c is now 7%, previously 6.9%,   Current treatment regimen:  Tresiba 8 units daily, NovoLog 3-4 units before pm meal, metformin ER 1500 mg, Farxiga 5 mg daily  Blood sugar patterns, management and problems identified:  She has stopped using the freestyle Libre sensor because of the expense  Difficult to be sure of her blood sugar patterns since her meter has an incorrect date or time programmed  Appears that she is checking her blood sugars mostly before breakfast at variable times and sometimes midday  She says she is trying to watch her diet using the Weight Watchers program but may be getting too many fruits  MEALTIME insulin: She takes 4 units all the time regardless of what she is eating at her carbohydrate can be very variable in quantity and also she may not always get enough protein at her evening meal  Also she admits that sometimes you she will forget her NovoLog at suppertime and on occasion she has taken it at bedtime causing overnight hypoglycemia  Her weight has gone down slightly  FASTING blood sugars are appearing to be variable although she is checking them at different times, lowest fasting reading 88 this morning  She has not done readings consistently after breakfast or lunch but does not appear to have high readings at those times when she is eating less carbohydrate  DIET: She is not eating a programmed evening meal and she is eating small amounts over an hour, also cooking more for her family then planning for herself.  She estimates that someday she may need as much as 100 g of carbohydrates in the evening  With occasional higher fat or carbohydrate intake she has had blood sugars as  high as 370 5 at night  Side effects from medications: minimal  Glucometer:  One Touch ultra 2.  Blood Glucose readings from One Touch monitor:  Mean values apply above for all meters except median for One Touch  PRE-MEAL Fasting Lunch Dinner Bedtime Overall  Glucose range: 88-1 81    111-375    Mean/median: 144  148   181  147    Meals: 3 meals per day, vegetarian, tries to get protein like nuts, [name] yogurt, soy, fish  Dinner usually at 7-8 pm.Cheese sandwiich, or vegetable soup with cheese, may have 1-2 starches per meal    Sometimes will have  peanut butter sandwich for lunch Occasionally will have oatmeal at breakfast with some dairy product   Physical activity: exercise: 6 -pm, 3/7, was walking stairs, upto 10,000 steps per day  Dietician visit: Most recent: 03/2013  CDE visit: 10/15    Wt Readings from Last 3 Encounters:  10/19/17 134 lb 12.8 oz (61.1 kg)  07/16/17 138 lb 9.6 oz (62.9 kg)  03/16/17 137 lb (62.1 kg)    LABS:  Lab Results  Component Value Date   HGBA1C 7.0 (H) 10/11/2017   HGBA1C 6.9 (H) 07/13/2017   HGBA1C 7.0 (H) 02/17/2017   Lab Results  Component Value Date   MICROALBUR 1.0 07/13/2017   LDLCALC 137 (H) 10/11/2017   CREATININE 0.65 10/11/2017      No visits with results within 1 Week(s) from this visit.  Latest  known visit with results is:  Lab on 10/11/2017  Component Date Value Ref Range Status  . Cholesterol 10/11/2017 242* 0 - 200 mg/dL Final   ATP III Classification       Desirable:  < 200 mg/dL               Borderline High:  200 - 239 mg/dL          High:  > = 240 mg/dL  . Triglycerides 10/11/2017 47.0  0.0 - 149.0 mg/dL Final   Normal:  <150 mg/dLBorderline High:  150 - 199 mg/dL  . HDL 10/11/2017 95.50  >39.00 mg/dL Final  . VLDL 10/11/2017 9.4  0.0 - 40.0 mg/dL Final  . LDL Cholesterol 10/11/2017 137* 0 - 99 mg/dL Final  . Total CHOL/HDL Ratio 10/11/2017 3   Final                  Men          Women1/2 Average Risk     3.4           3.3Average Risk          5.0          4.42X Average Risk          9.6          7.13X Average Risk          15.0          11.0                      . NonHDL 10/11/2017 146.41   Final   NOTE:  Non-HDL goal should be 30 mg/dL higher than patient's LDL goal (i.e. LDL goal of < 70 mg/dL, would have non-HDL goal of < 100 mg/dL)  . Sodium 10/11/2017 134* 135 - 145 mEq/L Final  . Potassium 10/11/2017 4.0  3.5 - 5.1 mEq/L Final  . Chloride 10/11/2017 99  96 - 112 mEq/L Final  . CO2 10/11/2017 27  19 - 32 mEq/L Final  . Glucose, Bld 10/11/2017 130* 70 - 99 mg/dL Final  . BUN 10/11/2017 13  6 - 23 mg/dL Final  . Creatinine, Ser 10/11/2017 0.65  0.40 - 1.20 mg/dL Final  . Calcium 10/11/2017 9.8  8.4 - 10.5 mg/dL Final  . GFR 10/11/2017 127.75  >60.00 mL/min Final  . Hgb A1c MFr Bld 10/11/2017 7.0* 4.6 - 6.5 % Final   Glycemic Control Guidelines for People with Diabetes:Non Diabetic:  <6%Goal of Therapy: <7%Additional Action Suggested:  >8%     Allergies as of 10/19/2017      Reactions   Other Anaphylaxis   Peaches , tomatoes, strawberry   Strawberry Extract Hives   Facial hives and mouth hives   Tomato Hives   Facial hives and mouth hives      Medication List        Accurate as of 10/19/17  8:55 AM. Always use your most recent med list.          BD PEN NEEDLE NANO U/F 32G X 4 MM Misc Generic drug:  Insulin Pen Needle USE AS DIRECTED   blood glucose meter kit and supplies Kit Use as instructed to test blood sugar. DX: E11.09   DULoxetine 60 MG capsule Commonly known as:  CYMBALTA Take 60 mg by mouth daily.   FARXIGA 5 MG Tabs tablet Generic drug:  dapagliflozin propanediol TAKE 1 TABLET BY MOUTH EVERY DAY **REPLACES JARDIANCE**  FREESTYLE LIBRE READER Devi 1 Device by Does not apply route as directed.   [place] Misc Apply to upper arm and change sensor every 10 days   FUSION PLUS PO Take 1 capsule by mouth daily. Takes for anemia   insulin  aspart 100 UNIT/ML FlexPen Commonly known as:  NOVOLOG 4 units before meals as directed   metFORMIN 500 MG 24 hr tablet Commonly known as:  GLUCOPHAGE-XR TAKE 4 TABLETS BY MOUTH EVERY DAY   multivitamin with minerals Tabs tablet Take 1 tablet by mouth daily.   ONE TOUCH ULTRA TEST test strip Generic drug:  glucose blood USE AS INSTRUCTED TO CHECK BLOOD SUGAR 2 TIMES A DAY   tranexamic acid 650 MG Tabs tablet Commonly known as:  LYSTEDA   TRESIBA FLEXTOUCH 100 UNIT/ML Sopn FlexTouch Pen Generic drug:  insulin degludec INJECT 10 UNITS INTO THE SKIN DAILY,ADJUST DOSE AS DIRECTED TO KEEP FASTING GLUCOSE BETWEEN 90-130       Allergies:  Allergies  Allergen Reactions  . Other Anaphylaxis    Peaches , tomatoes, strawberry  . Strawberry Extract Hives    Facial hives and mouth hives  . Tomato Hives    Facial hives and mouth hives    Past Medical History:  Diagnosis Date  . Back pain   . Diabetes mellitus   . Fibroids   . PCOS (polycystic ovarian syndrome)     Past Surgical History:  Procedure Laterality Date  . BREAST SURGERY     benign  . UTERINE FIBROID SURGERY      Family History  Problem Relation Age of Onset  . Hyperlipidemia Other   . Hypertension Other   . Stroke Other   . Cancer Other   . Diabetes Maternal Uncle   . Heart disease Paternal Uncle   . Diabetes Maternal Grandmother   . Heart disease Maternal Grandmother   . Diabetes Paternal Grandmother   . Breast cancer Neg Hx     Social History:  reports that  has never smoked. she has never used smokeless tobacco. She reports that she does not drink alcohol or use drugs.  Review of Systems -  Has high lipids at times, other times LDL has been below 100.  She thinks that she has had more high-fat foods especially dairy products and her LDL has gone up significantly  Lab Results  Component Value Date   CHOL 242 (H) 10/11/2017   HDL 95.50 10/11/2017   LDLCALC 137 (H) 10/11/2017   TRIG 47.0  10/11/2017   CHOLHDL 3 10/11/2017       Examination:   BP 118/70   Pulse 85   Ht '5\' 5"'$  (1.651 m)   Wt 134 lb 12.8 oz (61.1 kg)   SpO2 98%   BMI 22.43 kg/m   Body mass index is 22.43 kg/m.     Assesment:   DIABETES type 2:  See history of present illness for detailed discussion of  current management, blood sugar patterns and problems identified  A1c is 7% now when stable She is taking low doses of basal bolus insulin along with metformin ER 1500 mg and Farxiga 5 mg  As discussed above her blood sugars are variable based on her diet and she needs to start carbohydrate counting to help her estimate her mealtime insulin Also needs to avoid taking any Novolog at bedtime to prevent overnight hypoglycemia which has happened 3 times lately Regular exercise would help also  Recommendations today:  She will try  to estimate her carbohydrate intake at meals and adjust her NovoLog accordingly  She can try divide her total grams of carbohydrate by 15 to see if this helps her with control  She will try to check readings after evening meal consistently  Also she can try to use her FreeStyle libre Sensor at least once a month especially with the 14 day sensor  More protein at suppertime and less high fat or high carbohydrate meals  Regular exercise  Consultation with dietitian to help her with meal planning and insulin dosing  No change in [place] as yet  HYPERCHOLESTEROLEMIA: She is reluctant to try medications although she is concerned about this because of her family history of hyperlipidemia and agrees to start cutting back on high-fat foods, given her list of preferred food so that her saturated fat intake is reduced  Counseling time on subjects discussed above is over 50% of today's 25 minute visit  Patient Instructions  Check blood sugars on waking up 3-4/7   Also check blood sugars about 2 hours after a meal and do this after different meals by rotation  Recommended  blood sugar levels on waking up is 90-130 and about 2 hours after meal is 130-160  Please bring your blood sugar monitor to each visit, thank you  Take 1 unit per 15 g carbs      Jorey Dollard 10/19/2017, 8:55 AM   Note: This office note was prepared with Estate agent. Any transcriptional errors that result from this process are unintentional.

## 2017-10-19 NOTE — Patient Instructions (Addendum)
Check blood sugars on waking up 3-4/7   Also check blood sugars about 2 hours after a meal and do this after different meals by rotation  Recommended blood sugar levels on waking up is 90-130 and about 2 hours after meal is 130-160  Please bring your blood sugar monitor to each visit, thank you  Take 1 unit per 15 g carbs  Reduce animal and dairy fat

## 2017-11-13 ENCOUNTER — Other Ambulatory Visit: Payer: Self-pay | Admitting: Endocrinology

## 2017-12-31 ENCOUNTER — Other Ambulatory Visit: Payer: Self-pay | Admitting: Endocrinology

## 2018-01-14 ENCOUNTER — Other Ambulatory Visit (INDEPENDENT_AMBULATORY_CARE_PROVIDER_SITE_OTHER): Payer: BC Managed Care – PPO

## 2018-01-14 DIAGNOSIS — Z794 Long term (current) use of insulin: Secondary | ICD-10-CM

## 2018-01-14 DIAGNOSIS — E1165 Type 2 diabetes mellitus with hyperglycemia: Secondary | ICD-10-CM | POA: Diagnosis not present

## 2018-01-14 LAB — BASIC METABOLIC PANEL
BUN: 12 mg/dL (ref 6–23)
CALCIUM: 9.1 mg/dL (ref 8.4–10.5)
CO2: 28 meq/L (ref 19–32)
CREATININE: 0.7 mg/dL (ref 0.40–1.20)
Chloride: 104 mEq/L (ref 96–112)
GFR: 117.13 mL/min (ref >60.00)
Glucose, Bld: 83 mg/dL (ref 70–99)
Potassium: 3.9 mEq/L (ref 3.5–5.1)
Sodium: 139 mEq/L (ref 135–145)

## 2018-01-14 LAB — LIPID PANEL
Cholesterol: 183 mg/dL (ref 0–200)
HDL: 84.8 mg/dL (ref >39.00)
LDL Cholesterol: 91 mg/dL (ref 0–99)
NonHDL: 98.33
Total CHOL/HDL Ratio: 2
Triglycerides: 38 mg/dL (ref 0.0–149.0)
VLDL: 7.6 mg/dL (ref 0.0–40.0)

## 2018-01-14 LAB — HEMOGLOBIN A1C: Hgb A1c MFr Bld: 6.7 % — ABNORMAL HIGH (ref 4.6–6.5)

## 2018-01-18 NOTE — Progress Notes (Signed)
[name], female   DOB: 08/04/1976, 42 y.o.   MRN: [id number]    Reason for Appointment: Diabetes follow-up   History of Present Illness   Diagnosis: date of diagnosis: 2009, initially asymptomatic, has type 2 diabetes.   RECENT history:    A1c is now 6.7%, previously 7%,   Current treatment regimen:  Tresiba 8 units daily, NovoLog 3-4 units before pm meal, metformin ER 0 mg, Farxiga 5 mg daily  Blood sugar patterns, management and problems identified:  She is using the freestyle Libre sensor now but she thinks it didn't correlate with her finger 6 and maybe 10-20 mg lower  On her last visit she was asked to start doing CARBOHYDRATE counting to help her adjust mealtime coverage and she is starting to do this better  However may sometimes forget to take her insulin before eating  Also she thinks that if she is eating carbohydrates like rice, pasta or being she may overeat and blood sugars will still be very high at night  She has been advised not to take larger doses of NovoLog for correction at bedtime which she will sometimes take 5 units causing late-night hypoglycemia  FASTING readings are excellent although sometimes low-normal and only once as low as 60.  However her CGM appears to be probably reading a little lower than expected and nocturnal patterns are not consistently on the low side  Again highest blood sugars are after evening meal when she is eating most of her carbohydrates and still not consistently controlled  She does want to continue the freestyle [place] and is asking for the 14 day sensor  She has been asked to try and exercise but she still does not do so  However her weight is stable  Currently not taking metformin and she thinks that she had to stop it because of continued loose stools and her stomach feels much better without, does not appear to have higher readings with stopping this  No side effects with [place]  currently  Side effects from medications: minimal  Glucometer:  One Touch ultra 2 and FreeStyle libre .  Blood Glucose readings from download of both including One Touch monitor:  Mean values apply above for all meters except median for One Touch  PRE-MEAL Fasting Lunch Dinner Bedtime Overall  Glucose range: 60-137    52-345    Mean/median: 95  117  100  156  118     Meals: 3 meals per day, vegetarian, tries to get protein like nuts, [name] yogurt, soy, fish  Dinner usually at 7-8 pm.Cheese sandwiich, or vegetable soup with cheese, may have 1-2 starches per meal    Sometimes will have  peanut butter sandwich for lunch Occasionally will have oatmeal at breakfast with some dairy product   Physical activity: exercise: 6 -pm, 3/7, was walking stairs, upto 10,000 steps per day  Dietician visit: Most recent: 03/2013  CDE visit: 10/15    Wt Readings from Last 3 Encounters:  01/19/18 135 lb 9.6 oz (61.5 kg)  10/19/17 134 lb 12.8 oz (61.1 kg)  07/16/17 138 lb 9.6 oz (62.9 kg)    LABS:  Lab Results  Component Value Date   HGBA1C 6.7 (H) 01/14/2018   HGBA1C 7.0 (H) 10/11/2017   HGBA1C 6.9 (H) 07/13/2017   Lab Results  Component Value Date   MICROALBUR 1.0 07/13/2017   LDLCALC 91 01/14/2018   CREATININE 0.70 01/14/2018      Lab on 01/14/2018  Component  Date Value Ref Range Status  . Cholesterol 01/14/2018 183  0 - 200 mg/dL Final   ATP III Classification       Desirable:  < 200 mg/dL               Borderline High:  200 - 239 mg/dL          High:  > = 240 mg/dL  . Triglycerides 01/14/2018 38.0  0.0 - 149.0 mg/dL Final   Normal:  <150 mg/dLBorderline High:  150 - 199 mg/dL  . HDL 01/14/2018 84.80  >39.00 mg/dL Final  . VLDL 01/14/2018 7.6  0.0 - 40.0 mg/dL Final  . LDL Cholesterol 01/14/2018 91  0 - 99 mg/dL Final  . Total CHOL/HDL Ratio 01/14/2018 2   Final                  Men          Women1/2 Average Risk     3.4          3.3Average Risk          5.0          4.42X  Average Risk          9.6          7.13X Average Risk          15.0          11.0                      . NonHDL 01/14/2018 98.33   Final   NOTE:  Non-HDL goal should be 30 mg/dL higher than patient's LDL goal (i.e. LDL goal of < 70 mg/dL, would have non-HDL goal of < 100 mg/dL)  . Sodium 01/14/2018 139  135 - 145 mEq/L Final  . Potassium 01/14/2018 3.9  3.5 - 5.1 mEq/L Final  . Chloride 01/14/2018 104  96 - 112 mEq/L Final  . CO2 01/14/2018 28  19 - 32 mEq/L Final  . Glucose, Bld 01/14/2018 83  70 - 99 mg/dL Final  . BUN 01/14/2018 12  6 - 23 mg/dL Final  . Creatinine, Ser 01/14/2018 0.70  0.40 - 1.20 mg/dL Final  . Calcium 01/14/2018 9.1  8.4 - 10.5 mg/dL Final  . GFR 01/14/2018 117.13  >60.00 mL/min Final  . Hgb A1c MFr Bld 01/14/2018 6.7* 4.6 - 6.5 % Final   Glycemic Control Guidelines for People with Diabetes:Non Diabetic:  <6%Goal of Therapy: <7%Additional Action Suggested:  >8%     Allergies as of 01/19/2018      Reactions   Other Anaphylaxis   Peaches , tomatoes, strawberry   Strawberry Extract Hives   Facial hives and mouth hives   Tomato Hives   Facial hives and mouth hives      Medication List        Accurate as of 01/19/18 10:40 AM. Always use your most recent med list.          BD PEN NEEDLE NANO U/F 32G X 4 MM Misc Generic drug:  Insulin Pen Needle USE AS DIRECTED   blood glucose meter kit and supplies Kit Use as instructed to test blood sugar. DX: E11.09   DULoxetine 60 MG capsule Commonly known as:  CYMBALTA Take 60 mg by mouth daily.   FARXIGA 5 MG Tabs tablet Generic drug:  dapagliflozin propanediol TAKE 1 TABLET BY MOUTH EVERY DAY **REPLACES JARDIANCE**   FREESTYLE LIBRE 14 DAY READER Devi 1  Device by Does not apply route once for 1 dose.   FREESTYLE LIBRE 14 DAY SENSOR Misc 1 Units by Does not apply route every 14 (fourteen) days.   FUSION PLUS PO Take 1 capsule by mouth daily. Takes for anemia   metFORMIN 500 MG 24 hr tablet Commonly known  as:  GLUCOPHAGE-XR TAKE 4 TABLETS BY MOUTH EVERY DAY   multivitamin with minerals Tabs tablet Take 1 tablet by mouth daily.   NOVOLOG FLEXPEN 100 UNIT/ML FlexPen Generic drug:  insulin aspart INJECT 4 UNITS BEFORE MEALS AS DIRECTED   ONE TOUCH ULTRA TEST test strip Generic drug:  glucose blood USE AS INSTRUCTED TO CHECK BLOOD SUGAR 2 TIMES A DAY   tranexamic acid 650 MG Tabs tablet Commonly known as:  LYSTEDA   TRESIBA FLEXTOUCH 100 UNIT/ML Sopn FlexTouch Pen Generic drug:  insulin degludec INJECT 10 UNITS INTO THE SKIN DAILY,ADJUST DOSE AS DIRECTED TO KEEP FASTING GLUCOSE BETWEEN 90-130       Allergies:  Allergies  Allergen Reactions  . Other Anaphylaxis    Peaches , tomatoes, strawberry  . Strawberry Extract Hives    Facial hives and mouth hives  . Tomato Hives    Facial hives and mouth hives    Past Medical History:  Diagnosis Date  . Back pain   . Diabetes mellitus   . Fibroids   . PCOS (polycystic ovarian syndrome)     Past Surgical History:  Procedure Laterality Date  . BREAST SURGERY     benign  . UTERINE FIBROID SURGERY      Family History  Problem Relation Age of Onset  . Hyperlipidemia Other   . Hypertension Other   . Stroke Other   . Cancer Other   . Diabetes Maternal Uncle   . Heart disease Paternal Uncle   . Diabetes Maternal Grandmother   . Heart disease Maternal Grandmother   . Diabetes Paternal Grandmother   . Breast cancer Neg Hx     Social History:  reports that  has never smoked. she has never used smokeless tobacco. She reports that she does not drink alcohol or use drugs.  Review of Systems -  Has high lipids at times, other times LDL has been below 100.  She has cut back significantly on cheese and watching dairy products and her LDL is much better now   Lab Results  Component Value Date   CHOL 183 01/14/2018   HDL 84.80 01/14/2018   LDLCALC 91 01/14/2018   TRIG 38.0 01/14/2018   CHOLHDL 2 01/14/2018        Examination:   BP 108/62   Pulse 70   Ht '5\' 5"'$  (1.651 m)   Wt 135 lb 9.6 oz (61.5 kg)   SpO2 99%   BMI 22.57 kg/m   Body mass index is 22.57 kg/m.     Assesment:   DIABETES type 2:  See history of present illness for detailed discussion of  current management, blood sugar patterns and problems identified  A1c is 6.7 and improving  She is taking low doses of basal bolus insulin along with metformin ER 1500 mg and Farxiga 5 mg  She does better when she is able to use a FreeStyle libre sensor especially with monitoring her postprandial readings This however tends to be reading falsely low by about 10-20 mg reportedly  Also she thinks she can try to consistently watch her carbohydrates and portions which helps her control at night Still has several significantly high readings at  night based on what she is eating or if she forgets her Novolog  Current Tresiba dose appears to be adequate although he has had one low blood sugar she thinks was from a small meal the night before   Recommendations today:  She will continue to balanced meals a moderate do certain carbohydrates and Protein consistently low fat content  No change in carbohydrate coverage for her evening meal  She will get the 14 days FreeStyle libre sensor  Will not restart metformin since she is intolerant to it in blood sugars are not looking higher  Regular exercise  Consultation with dietitian may still be needed if she is not able to get her to help her with meal planning and insulin dosing  No change in [place]  unless fasting blood sugars are consistently out of range  Also told her not to take extra [place] even if blood sugars are higher at bedtime  HYPERCHOLESTEROLEMIA: She is doing much better with her saturated fat intake particularly cheese and LDL has come down significantly to below 100 now  There are no Patient Instructions on file for this visit.  Counseling time on subjects discussed in  assessment and plan sections is over 50% of today's 25 minute visit   Elayne Snare 01/19/2018, 10:40 AM   Note: This office note was prepared with Dragon voice recognition system technology. Any transcriptional errors that result from this process are unintentional.

## 2018-01-19 ENCOUNTER — Encounter: Payer: Self-pay | Admitting: Endocrinology

## 2018-01-19 ENCOUNTER — Ambulatory Visit: Payer: BC Managed Care – PPO | Admitting: Endocrinology

## 2018-01-19 VITALS — BP 108/62 | HR 70 | Ht 65.0 in | Wt 135.6 lb

## 2018-01-19 DIAGNOSIS — E1165 Type 2 diabetes mellitus with hyperglycemia: Secondary | ICD-10-CM

## 2018-01-19 DIAGNOSIS — E7800 Pure hypercholesterolemia, unspecified: Secondary | ICD-10-CM | POA: Diagnosis not present

## 2018-01-19 DIAGNOSIS — Z794 Long term (current) use of insulin: Secondary | ICD-10-CM

## 2018-01-19 MED ORDER — FREESTYLE LIBRE 14 DAY READER DEVI: 1.0000 | Freq: Once | 0 refills | Status: AC

## 2018-01-19 MED ORDER — FREESTYLE LIBRE 14 DAY SENSOR MISC: 1.0000 [IU] | 4 refills | Status: DC

## 2018-02-02 ENCOUNTER — Other Ambulatory Visit: Payer: Self-pay | Admitting: Endocrinology

## 2018-02-03 ENCOUNTER — Other Ambulatory Visit: Payer: Self-pay | Admitting: Obstetrics and Gynecology

## 2018-02-03 ENCOUNTER — Other Ambulatory Visit (HOSPITAL_COMMUNITY)
Admission: RE | Admit: 2018-02-03 | Discharge: 2018-02-03 | Disposition: A | Discharge status: Home / Self Care | Payer: BC Managed Care – PPO | Source: Ambulatory Visit | Attending: Obstetrics and Gynecology | Admitting: Obstetrics and Gynecology

## 2018-02-03 DIAGNOSIS — Z01411 Encounter for gynecological examination (general) (routine) with abnormal findings: Secondary | ICD-10-CM | POA: Diagnosis present

## 2018-02-07 LAB — CYTOLOGY - PAP
Diagnosis: NEGATIVE
HPV: NOT DETECTED

## 2018-03-02 ENCOUNTER — Other Ambulatory Visit: Payer: Self-pay | Admitting: Endocrinology

## 2018-03-28 ENCOUNTER — Ambulatory Visit
Admission: RE | Admit: 2018-03-28 | Discharge: 2018-03-28 | Disposition: A | Discharge status: Home / Self Care | Payer: BC Managed Care – PPO | Source: Ambulatory Visit | Attending: Obstetrics and Gynecology | Admitting: Obstetrics and Gynecology

## 2018-03-28 DIAGNOSIS — Z1231 Encounter for screening mammogram for malignant neoplasm of breast: Secondary | ICD-10-CM

## 2018-03-29 ENCOUNTER — Other Ambulatory Visit: Payer: Self-pay | Admitting: Obstetrics and Gynecology

## 2018-03-29 DIAGNOSIS — R928 Other abnormal and inconclusive findings on diagnostic imaging of breast: Secondary | ICD-10-CM

## 2018-04-01 ENCOUNTER — Ambulatory Visit
Admission: RE | Admit: 2018-04-01 | Discharge: 2018-04-01 | Disposition: A | Discharge status: Home / Self Care | Payer: BC Managed Care – PPO | Source: Ambulatory Visit | Attending: Obstetrics and Gynecology | Admitting: Obstetrics and Gynecology

## 2018-04-01 DIAGNOSIS — R928 Other abnormal and inconclusive findings on diagnostic imaging of breast: Secondary | ICD-10-CM

## 2018-04-15 ENCOUNTER — Other Ambulatory Visit (INDEPENDENT_AMBULATORY_CARE_PROVIDER_SITE_OTHER): Payer: BC Managed Care – PPO

## 2018-04-15 DIAGNOSIS — Z794 Long term (current) use of insulin: Secondary | ICD-10-CM

## 2018-04-15 DIAGNOSIS — E1165 Type 2 diabetes mellitus with hyperglycemia: Secondary | ICD-10-CM | POA: Diagnosis not present

## 2018-04-15 LAB — COMPREHENSIVE METABOLIC PANEL
ALT: 13 U/L (ref 0–35)
AST: 16 U/L (ref 0–37)
Albumin: 3.9 g/dL (ref 3.5–5.2)
Alkaline Phosphatase: 67 U/L (ref 39–117)
BILIRUBIN TOTAL: 1 mg/dL (ref 0.2–1.2)
BUN: 13 mg/dL (ref 6–23)
CO2: 26 meq/L (ref 19–32)
CREATININE: 0.7 mg/dL (ref 0.40–1.20)
Calcium: 9.1 mg/dL (ref 8.4–10.5)
Chloride: 106 mEq/L (ref 96–112)
GFR: 117 mL/min (ref >60.00)
GLUCOSE: 74 mg/dL (ref 70–99)
Potassium: 3.5 mEq/L (ref 3.5–5.1)
Sodium: 140 mEq/L (ref 135–145)
Total Protein: 6.8 g/dL (ref 6.0–8.3)

## 2018-04-15 LAB — HEMOGLOBIN A1C: Hgb A1c MFr Bld: 7.5 % — ABNORMAL HIGH (ref 4.6–6.5)

## 2018-04-18 ENCOUNTER — Encounter: Payer: Self-pay | Admitting: Endocrinology

## 2018-04-18 NOTE — Progress Notes (Signed)
[name], female   DOB: [birth year], 42 y.o.   MRN: [id number]    Reason for Appointment: Diabetes 81-monthfollow-up   History of Present Illness   Diagnosis: date of diagnosis: 2009, initially asymptomatic, has type 2 diabetes.   RECENT history:     A1c is now 7.5 was down to 6.7 in in February  Current treatment regimen:  Tresiba 8 units daily, NovoLog 3-4 units before pm meal, Farxiga 5 mg daily  Blood sugar patterns, management and problems identified:  She is using the freestyle Libre sensor but not for the last 3 weeks she keeps knocking the sensors off on doorways  Has only sporadic blood sugar monitoring with her One Touch meter and mostly in the mornings and midday  She says over the last couple of months because of personal stress she had been going off her diet frequently and was having readings as high as 350 especially after lunch but did not take mealtime insulin when needed proactively also  She was previously time carbohydrate counting for her mealtime insulin calculation but recently not doing this  Yesterday with only eating an apple with some other small amount of carbohydrate and cheese she took 4 units of insulin and had a low blood sugar; she then treated this with 2 granola bars  HYPOGLYCEMIA has been documented another couple of times late at night and once had a reading of 69 at 4 AM  She does have symptoms with low sugars at night  LOWEST blood sugar was related to taking a correction dose of NovoLog at bedtime when blood sugar was 211  She is not planning her meals well and may not always get full with what she is eating  However FASTING blood sugars are recently appearing higher and not clear why, has not been forgetting her TTyler Aasor oral medications  but she thinks it didn't correlate with her finger 6 and maybe 10-20 mg lower  On her last visit she was asked to start doing CARBOHYDRATE counting to help her adjust  mealtime coverage and she is starting to do this better  However may sometimes forget to take her insulin before eating  Also she thinks that if she is eating carbohydrates like rice, pasta or being she may overeat and blood sugars will still be very high at night  She has been advised not to take larger doses of NovoLog for correction at bedtime which she will sometimes take 5 units causing late-night hypoglycemia  FASTING readings are excellent although sometimes low-normal and only once as low as 60.  However her CGM appears to be probably reading a little lower than expected and nocturnal patterns are not consistently on the low side  Previously her blood sugars will be higher after supper but she has only a couple of readings checked lately  Freestyle sensor in late April showed average reading about 180-200 after lunch and supper but usually fairly good readings fasting  Side effects from medications: Diarrhea from metformin and abdominal discomfort  Glucometer:  One Touch ultra 2 and FreeStyle libre .  Blood Glucose readings from download of both including One Touch monitor as above:   FASTING blood sugars recently ranged from 94 up to 194 with median 155 Median midday of 90 and after lunch 160   Meals: 3 meals per day, vegetarian, tries to get protein like nuts, G[place]yogurt, soy, fish  Dinner usually at 7-8 pm.Cheese sandwiich, or vegetable soup with cheese, may  have 1-2 starches per meal    Sometimes will have  peanut butter sandwich for lunch Occasionally will have oatmeal at breakfast with some dairy product   Physical activity: exercise: 6 -pm, 3/7, was walking stairs, upto 10,000 steps per day  Dietician visit: Most recent: 03/2013  CDE visit: 10/15    Wt Readings from Last 3 Encounters:  04/19/18 140 lb 6.4 oz (63.7 kg)  01/19/18 135 lb 9.6 oz (61.5 kg)  10/19/17 134 lb 12.8 oz (61.1 kg)    LABS:  Lab Results  Component Value Date   HGBA1C 7.5 (H) 04/15/2018    HGBA1C 6.7 (H) 01/14/2018   HGBA1C 7.0 (H) 10/11/2017   Lab Results  Component Value Date   MICROALBUR 1.0 07/13/2017   LDLCALC 91 01/14/2018   CREATININE 0.70 04/15/2018      Lab on 04/15/2018  Component Date Value Ref Range Status  . Sodium 04/15/2018 140  135 - 145 mEq/L Final  . Potassium 04/15/2018 3.5  3.5 - 5.1 mEq/L Final  . Chloride 04/15/2018 106  96 - 112 mEq/L Final  . CO2 04/15/2018 26  19 - 32 mEq/L Final  . Glucose, Bld 04/15/2018 74  70 - 99 mg/dL Final  . BUN 04/15/2018 13  6 - 23 mg/dL Final  . Creatinine, Ser 04/15/2018 0.70  0.40 - 1.20 mg/dL Final  . Total Bilirubin 04/15/2018 1.0  0.2 - 1.2 mg/dL Final  . Alkaline Phosphatase 04/15/2018 67  39 - 117 U/L Final  . AST 04/15/2018 16  0 - 37 U/L Final  . ALT 04/15/2018 13  0 - 35 U/L Final  . Total Protein 04/15/2018 6.8  6.0 - 8.3 g/dL Final  . Albumin 04/15/2018 3.9  3.5 - 5.2 g/dL Final  . Calcium 04/15/2018 9.1  8.4 - 10.5 mg/dL Final  . GFR 04/15/2018 117.00  >60.00 mL/min Final  . Hgb A1c MFr Bld 04/15/2018 7.5* 4.6 - 6.5 % Final   Glycemic Control Guidelines for People with Diabetes:Non Diabetic:  <6%Goal of Therapy: <7%Additional Action Suggested:  >8%     Allergies as of 04/19/2018      Reactions   Other Anaphylaxis   Peaches , tomatoes, strawberry   Strawberry Extract Hives   Facial hives and mouth hives   Tomato Hives   Facial hives and mouth hives      Medication List        Accurate as of 04/19/18 12:49 PM. Always use your most recent med list.          BD PEN NEEDLE NANO U/F 32G X 4 MM Misc Generic drug:  Insulin Pen Needle USE AS DIRECTED   blood glucose meter kit and supplies Kit Use as instructed to test blood sugar. DX: E11.09   DULoxetine 60 MG capsule Commonly known as:  CYMBALTA Take 60 mg by mouth daily.   FARXIGA 5 MG Tabs tablet Generic drug:  dapagliflozin propanediol TAKE 1 TABLET BY MOUTH EVERY DAY **REPLACES JARDIANCE**   FREESTYLE LIBRE 14 DAY SENSOR  Misc 1 UNITS BY DOES NOT APPLY ROUTE EVERY 14 (FOURTEEN) DAYS.   FUSION PLUS PO Take 1 capsule by mouth daily. Takes for anemia   multivitamin with minerals Tabs tablet Take 1 tablet by mouth daily.   NOVOLOG FLEXPEN 100 UNIT/ML FlexPen Generic drug:  insulin aspart INJECT 4 UNITS BEFORE MEALS AS DIRECTED   ONE TOUCH ULTRA TEST test strip Generic drug:  glucose blood USE AS INSTRUCTED TO CHECK BLOOD SUGAR 2 TIMES  A DAY   tranexamic acid 650 MG Tabs tablet Commonly known as:  LYSTEDA   TRESIBA FLEXTOUCH 100 UNIT/ML Sopn FlexTouch Pen Generic drug:  insulin degludec INJECT 10 UNITS INTO THE SKIN DAILY,ADJUST DOSE AS DIRECTED TO KEEP FASTING GLUCOSE BETWEEN 90-130       Allergies:  Allergies  Allergen Reactions  . Other Anaphylaxis    Peaches , tomatoes, strawberry  . Strawberry Extract Hives    Facial hives and mouth hives  . Tomato Hives    Facial hives and mouth hives    Past Medical History:  Diagnosis Date  . Back pain   . Diabetes mellitus   . Fibroids   . PCOS (polycystic ovarian syndrome)     Past Surgical History:  Procedure Laterality Date  . BREAST EXCISIONAL BIOPSY Right   . BREAST SURGERY     benign  . UTERINE FIBROID SURGERY      Family History  Problem Relation Age of Onset  . Hyperlipidemia Other   . Hypertension Other   . Stroke Other   . Cancer Other   . Diabetes Maternal Uncle   . Heart disease Paternal Uncle   . Diabetes Maternal Grandmother   . Heart disease Maternal Grandmother   . Breast cancer Maternal Grandmother        diagnosed in her 37's  . Diabetes Paternal Grandmother     Social History:  reports that she has never smoked. She has never used smokeless tobacco. She reports that she does not drink alcohol or use drugs.  Review of Systems -  Has high lipids at times, otherwise at times LDL has been below 100.  She has cut back significantly on cheese and watching dairy products usually, is vegetarian   Lab Results    Component Value Date   CHOL 183 01/14/2018   HDL 84.80 01/14/2018   LDLCALC 91 01/14/2018   TRIG 38.0 01/14/2018   CHOLHDL 2 01/14/2018       Examination:   BP 110/68 (BP Location: Left Arm, Patient Position: Sitting, Cuff Size: Normal)   Pulse 71   Ht '5\' 5"'$  (1.651 m)   Wt 140 lb 6.4 oz (63.7 kg)   SpO2 99%   BMI 23.36 kg/m   Body mass index is 23.36 kg/m.     Assesment:   DIABETES type 2:  See history of present illness for detailed discussion of  current management, blood sugar patterns and problems identified  A1c is 7.5  Most of her hypoglycemia recently is related to poor diet and still not planning her meals Also not covering her meals accurately with carbohydrate counting, usually takes mealtime insulin only at suppertime Also not clear why fasting readings are high most of the time although she has had readings as low as 69 early morning and 94 fasting also She is not monitoring much with her freestyle Libre and only sporadically with the One Touch meter Discussed need for more information about her blood sugars which will be more convenient with the freestyle libre    Recommendations today:  She will see the dietitian to help her with planning her meals  She will need to start counting carbohydrates more accurately and consistently eat for her evening insulin regimen  May increase Tresiba if fasting readings continue to be consistently high  However she will try to see if her readings after supper are better controlled first  She will not take any NovoLog at bedtime if blood sugars are high  Continue  Farxiga  Regular exercise with walking  May need to take mealtime insulin at lunch if eating more carbohydrates  She will get the 14 days FreeStyle libre sensor again  Will not restart metformin since she is intolerant to it in blood sugars are not looking higher  Regular exercise  Consultation with dietitian will be scheduled  She can get the  armband for the [place] keep it in place Counseling time on subjects discussed in assessment and plan sections is over 50% of today's 25 minute visit      Patient Instructions  Carb counting for pm meal and do 1:15 insulin ratio        Elayne Snare 04/19/2018, 12:49 PM   Note: This office note was prepared with Dragon voice recognition system technology. Any transcriptional errors that result from this process are unintentional.

## 2018-04-19 ENCOUNTER — Ambulatory Visit: Payer: BC Managed Care – PPO | Admitting: Endocrinology

## 2018-04-19 ENCOUNTER — Encounter: Payer: Self-pay | Admitting: Endocrinology

## 2018-04-19 VITALS — BP 110/68 | HR 71 | Ht 65.0 in | Wt 140.4 lb

## 2018-04-19 DIAGNOSIS — Z794 Long term (current) use of insulin: Secondary | ICD-10-CM | POA: Diagnosis not present

## 2018-04-19 DIAGNOSIS — E1165 Type 2 diabetes mellitus with hyperglycemia: Secondary | ICD-10-CM

## 2018-04-19 NOTE — Patient Instructions (Signed)
Carb counting for pm meal and do 1:15 insulin ratio

## 2018-05-10 ENCOUNTER — Encounter: Payer: BC Managed Care – PPO | Attending: Physician Assistant | Admitting: Dietician

## 2018-05-10 ENCOUNTER — Encounter: Payer: Self-pay | Admitting: Dietician

## 2018-05-10 DIAGNOSIS — Z6823 Body mass index (BMI) 23.0-23.9, adult: Secondary | ICD-10-CM | POA: Insufficient documentation

## 2018-05-10 DIAGNOSIS — Z713 Dietary counseling and surveillance: Secondary | ICD-10-CM | POA: Diagnosis present

## 2018-05-10 DIAGNOSIS — Z794 Long term (current) use of insulin: Secondary | ICD-10-CM | POA: Diagnosis not present

## 2018-05-10 DIAGNOSIS — E1165 Type 2 diabetes mellitus with hyperglycemia: Secondary | ICD-10-CM | POA: Diagnosis not present

## 2018-05-10 NOTE — Progress Notes (Signed)
Diabetes Self-Management Education  Visit Type: First/Initial  Appt. Start Time: 1430 Appt. End Time: 3716  05/10/2018  Mia Wilson, identified by name and date of birth, is a 42 y.o. female with a diagnosis of Diabetes: Type 2.  Patient follows a vegetarian/pescitarian diet. She is allergic to all red fruits especially tomatoes which cause hives. Weight 139 lbs today.  High of 149 lbs and Low of 110 lbs when she went of a vegan diet in the past. Medications include:  Tresiba 8 units per day, Novolog 3-4 units before meals.  She is only taking this before dinner.   She complains of low energy and visit today is to talk about nutrition to improve blood sugar and health. Based on her meal plan, her diet is too low in carbohydrates with breakfast and most lunches and low in protein at lunch often.  She lacks vegetables at times. Discussed whole foods plant based eating as well as significant effect of exercise on her blood sugar.  Discussed improving nutrition balance and adequacy. She uses a freeStyle [place] but this was just put on.  Patient lives with her husband who works 2 jobs Set designer).  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 She also has 4 boys who are adopted and ages 14, 65, 9, 58.   She does the shopping and cooking. She feels that she is having a hard time taking care of her diabetes and self car.   ASSESSMENT  Height _0  (1.651 m), weight 139 lb (63 kg). Body mass index is 23.13 kg/m.  Diabetes Self-Management Education - 05/10/18 1448      Visit Information   Visit Type  First/Initial      Initial Visit   Diabetes Type  Type 2    Are you currently following a meal plan?  Yes Pescitarian    What type of meal plan do you follow?  Pescitarian    Are you taking your medications as prescribed?  Yes    Date Diagnosed  2009      Health Coping   How would you rate your overall health?  Fair      Psychosocial Assessment   Patient Belief/Attitude about Diabetes  Defeat/Burnout    Self-care barriers  None    Self-management support  Family;Doctor's office    Other persons present  Patient    Patient Concerns  Nutrition/Meal planning;Glycemic Control    Special Needs  None    Preferred Learning Style  No preference indicated    Learning Readiness  Ready    How often do you need to have someone help you when you read instructions, pamphlets, or other written materials from your doctor or pharmacy?  1 - Never    What is the last grade level you completed in school?  Graduate degree      Pre-Education Assessment   Patient understands the diabetes disease and treatment process.  Needs Review    Patient understands incorporating nutritional management into lifestyle.  Needs Review    Patient undertands incorporating physical activity into lifestyle.  Needs Review    Patient understands using medications safely.  Needs Review    Patient understands monitoring blood glucose, interpreting and using results  Needs Review    Patient understands prevention, detection, and treatment of acute complications.  Needs Review    Patient understands prevention, detection, and treatment of chronic complications.  Needs Review    Patient understands how  to develop strategies to address psychosocial issues.  Needs Review    Patient understands how to develop strategies to promote health/change behavior.  Needs Review      Complications   Last HgB A1C per patient/outside source  7.5 % 04/20/18 increased from 6.7% 01/14/18    How often do you check your blood sugar?  > 4 times/day    Fasting Blood glucose range (mg/dL)  180-200;130-179;>200    Postprandial Blood glucose range (mg/dL)  130-179;180-200;>200    Number of hypoglycemic episodes per month  3    Can you tell when your blood sugar is low?  Yes    What do you do if your blood sugar is low?  15 grams of fast acting sugar    Number of hyperglycemic  episodes per week  14    Can you tell when your blood sugar is high?  Yes    What do you do if your blood sugar is high?  takes additional Novolog, walks, hydrates    Have you had a dilated eye exam in the past 12 months?  Yes    Have you had a dental exam in the past 12 months?  Yes    Are you checking your feet?  Yes    How many days per week are you checking your feet?  7      Dietary Intake   Breakfast  greek yogurt (8-10g CHO) with occasional banana    Snack (morning)  none    Lunch  half bag asian salad with almonds OR cafeteria at work    Snack (afternoon)  none OR tortilla chips or peanuts on Saturday (snacks too heavily such as whole bag of chips)    Dinner  3 scrambled eggs, cheddar cheese and 2 slices ezekiel bread OR fish, vegetables, occasional mac and cheese    Snack (evening)  none    Beverage(s)  water, green tea unsweetened, whey protein in yogurt      Exercise   Exercise Type  Light (walking / raking leaves)    How many days per week to you exercise?  1    How many minutes per day do you exercise?  30    Total minutes per week of exercise  30      Patient Education   Previous Diabetes Education  Yes (please comment) 2015 with Vaughan Basta, CDE and 2014 with Kathlee Nations RD for several visits.    Disease state   Other (comment) reviewed insulin resistance    Nutrition management   Role of diet in the treatment of diabetes and the relationship between the three main macronutrients and blood glucose level;Food label reading, portion sizes and measuring food.;Carbohydrate counting;Meal options for control of blood glucose level and chronic complications.    Physical activity and exercise   Role of exercise on diabetes management, blood pressure control and cardiac health.    Medications  Reviewed patients medication for diabetes, action, purpose, timing of dose and side effects.    Monitoring  Purpose and frequency of SMBG.    Psychosocial adjustment  Role of stress on diabetes     Personal strategies to promote health  Helped patient develop diabetes management plan for (enter comment) balanced meals      Individualized Goals (developed by patient)   Nutrition  General guidelines for healthy choices and portions discussed    Physical Activity  Exercise 5-7 days per week;30 minutes per day    Medications  take my medication as  prescribed    Monitoring   test my blood glucose as discussed    Problem Solving  meal planning    Reducing Risk  examine blood glucose patterns    Health Coping  discuss diabetes with (comment) MD, RD, CDE      Post-Education Assessment   Patient understands the diabetes disease and treatment process.  Demonstrates understanding / competency    Patient understands incorporating nutritional management into lifestyle.  Demonstrates understanding / competency    Patient undertands incorporating physical activity into lifestyle.  Demonstrates understanding / competency    Patient understands using medications safely.  Demonstrates understanding / competency    Patient understands monitoring blood glucose, interpreting and using results  Demonstrates understanding / competency    Patient understands prevention, detection, and treatment of acute complications.  Demonstrates understanding / competency    Patient understands prevention, detection, and treatment of chronic complications.  Demonstrates understanding / competency    Patient understands how to develop strategies to address psychosocial issues.  Demonstrates understanding / competency    Patient understands how to develop strategies to promote health/change behavior.  Needs Review      Outcomes   Expected Outcomes  Demonstrated interest in learning. Expect positive outcomes    Future DMSE  3-4 months    Program Status  Not Completed       Individualized Plan for Diabetes Self-Management Training:   Learning Objective:  Patient will have a greater understanding of diabetes  self-management. Patient education plan is to attend individual and/or group sessions per assessed needs and concerns.   Plan:   Patient Instructions  Find ways to be active most days.  (walking) Add carbohydrate and a small amount of protein such as legumes, edamame, tofu  with each meal. Take the Novolog prior to your meals.  Resources:  Vegan for Her by Clint Bolder, RD  Becoming Vegan  [email or web address]  [place] [facility] [place] Psychologist, clinical    Expected Outcomes:  Demonstrated interest in learning. Expect positive outcomes  Education material provided: Meal plan card and Snack sheet; plant based primer  If problems or questions, patient to contact team via:  Phone  Future DSME appointment: 3-4 months

## 2018-05-10 NOTE — Patient Instructions (Addendum)
Find ways to be active most days.  (walking) Add carbohydrate and a small amount of protein such as legumes, edamame, tofu  with each meal. Take the Novolog prior to your meals.  Resources:  Vegan for Her by Clint Bolder, RD  Becoming Vegan  [email or web address]  [employer] [place]

## 2018-05-12 ENCOUNTER — Encounter: Payer: Self-pay | Admitting: Dietician

## 2018-06-01 ENCOUNTER — Telehealth: Payer: Self-pay

## 2018-06-01 NOTE — Telephone Encounter (Signed)
Please see paper on your desk with blood sugar readings. Pt is concerned and thinks maybe her blood sugars are too high at times.

## 2018-06-01 NOTE — Telephone Encounter (Signed)
There is no consistent pattern of high sugars, she will need to take 4 units NovoLog if she is eating high carbohydrate lunch; she will take 5 up to 7 units at dinner, higher dose depending on the carbohydrate intake.  May need to see Vaughan Basta or Mickel Baas for further education

## 2018-06-02 NOTE — Telephone Encounter (Signed)
Called pt and gave her MD message. Pt verbalized understanding and stated that she has an upcoming appointment with Mickel Baas in August.

## 2018-06-06 ENCOUNTER — Other Ambulatory Visit: Payer: Self-pay | Admitting: Endocrinology

## 2018-07-02 ENCOUNTER — Other Ambulatory Visit: Payer: Self-pay | Admitting: Endocrinology

## 2018-07-20 ENCOUNTER — Other Ambulatory Visit (INDEPENDENT_AMBULATORY_CARE_PROVIDER_SITE_OTHER): Payer: BC Managed Care – PPO

## 2018-07-20 DIAGNOSIS — E1165 Type 2 diabetes mellitus with hyperglycemia: Secondary | ICD-10-CM | POA: Diagnosis not present

## 2018-07-20 DIAGNOSIS — Z794 Long term (current) use of insulin: Secondary | ICD-10-CM

## 2018-07-20 LAB — LIPID PANEL
CHOL/HDL RATIO: 2
Cholesterol: 187 mg/dL (ref 0–200)
HDL: 84.8 mg/dL (ref >39.00)
LDL Cholesterol: 94 mg/dL (ref 0–99)
NONHDL: 101.7
Triglycerides: 39 mg/dL (ref 0.0–149.0)
VLDL: 7.8 mg/dL (ref 0.0–40.0)

## 2018-07-20 LAB — MICROALBUMIN / CREATININE URINE RATIO
Creatinine,U: 81.3 mg/dL
MICROALB/CREAT RATIO: 0.9 mg/g (ref 0.0–30.0)
Microalb, Ur: <0.7 mg/dL (ref 0.0–1.9)

## 2018-07-20 LAB — COMPREHENSIVE METABOLIC PANEL
ALK PHOS: 64 U/L (ref 39–117)
ALT: 15 U/L (ref 0–35)
AST: 17 U/L (ref 0–37)
Albumin: 4 g/dL (ref 3.5–5.2)
BUN: 11 mg/dL (ref 6–23)
CALCIUM: 9.2 mg/dL (ref 8.4–10.5)
CO2: 27 mEq/L (ref 19–32)
Chloride: 103 mEq/L (ref 96–112)
Creatinine, Ser: 0.78 mg/dL (ref 0.40–1.20)
GFR: 103.14 mL/min (ref >60.00)
GLUCOSE: 102 mg/dL — AB (ref 70–99)
POTASSIUM: 4.3 meq/L (ref 3.5–5.1)
Sodium: 137 mEq/L (ref 135–145)
TOTAL PROTEIN: 6.9 g/dL (ref 6.0–8.3)
Total Bilirubin: 1.2 mg/dL (ref 0.2–1.2)

## 2018-07-20 LAB — HEMOGLOBIN A1C: Hgb A1c MFr Bld: 7.7 % — ABNORMAL HIGH (ref 4.6–6.5)

## 2018-07-22 ENCOUNTER — Encounter: Payer: Self-pay | Admitting: Endocrinology

## 2018-07-22 ENCOUNTER — Other Ambulatory Visit: Payer: Self-pay | Admitting: Physician Assistant

## 2018-07-22 ENCOUNTER — Ambulatory Visit: Payer: BC Managed Care – PPO | Admitting: Endocrinology

## 2018-07-22 VITALS — BP 116/72 | HR 76 | Ht 65.0 in | Wt 142.0 lb

## 2018-07-22 DIAGNOSIS — R109 Unspecified abdominal pain: Secondary | ICD-10-CM

## 2018-07-22 DIAGNOSIS — E1165 Type 2 diabetes mellitus with hyperglycemia: Secondary | ICD-10-CM

## 2018-07-22 DIAGNOSIS — Z794 Long term (current) use of insulin: Secondary | ICD-10-CM

## 2018-07-22 NOTE — Progress Notes (Signed)
[name], female   DOB: [birth year], 42 y.o.   MRN: [id number]    Reason for Appointment: Diabetes 90-monthfollow-up   History of Present Illness   Diagnosis: date of diagnosis: 2009, initially asymptomatic, has type 2 diabetes.   RECENT history:     A1c is now 7.7 and gradually increasing, was down to 6.7 in in February  Current treatment regimen:  Tresiba 10 units daily, NovoLog usually 1: 15 carbohydrate coverage, Farxiga 5 mg daily  Blood sugar patterns, management and problems identified:  She is using the freestyle Libre sensor again but could not afford to refill the sensor about 2 weeks ago  From this appears that she has significant postprandial hyperglycemia  [place] of [place]  More recently she is using the One Touch monitor and generally checking fasting readings only  FASTING readings are usually fairly good  She is trying to take her NOVOLOG based on her carbohydrate intake and is trying to count carbohydrates most of the time  However her doses are usually inadequate based on 1: 15 coverage  Also may not consistently take her insulin at lunch or supper  Again at times will take her insulin postprandially at variable times in the evenings which may sometimes cause hypoglycemia late at night  Does not have hypoglycemia otherwise  She did go up on her Tresiba by 2 units on her own to keep her morning readings down    Side effects from medications: Diarrhea from metformin and abdominal discomfort  Glucometer:  One Touch ultra 2 and FreeStyle libre .  Blood Glucose readings from download of both including One Touch monitor as below  CGM use % of time  80  Average and SD  160+/-67  Time in range  69%  % Time Above 180  29  % Time above 250  11  % Time Below target  2      PRE-MEAL Fasting Lunch Dinner Bedtime Overall  Glucose range:  103-169     59-289   Mean/median:  132  162   161 160   POST-MEAL PC Breakfast PC Lunch PC Dinner  Glucose range:   195-474   Mean/median:   172  169    Meals: 3 meals per day, vegetarian, tries to get protein like nuts, [place]ogurt, soy, fish  Dinner usually at 7-8 pm.Cheese sandwiich, or vegetable soup with cheese, may have 1-2 starches per meal    Sometimes will have  peanut butter sandwich for lunch Occasionally will have oatmeal at breakfast with some dairy product   Physical activity: exercise: 6 -pm, 3/7, was walking stairs, upto 10,000 steps per day  Dietician visit: Most recent: 03/2013  CDE visit: 10/15    Wt Readings from Last 3 Encounters:  07/22/18 142 lb (64.4 kg)  05/10/18 139 lb (63 kg)  04/19/18 140 lb 6.4 oz (63.7 kg)    LABS:  Lab Results  Component Value Date   HGBA1C 7.7 (H) 07/20/2018   HGBA1C 7.5 (H) 04/15/2018   HGBA1C 6.7 (H) 01/14/2018   Lab Results  Component Value Date   MICROALBUR <0.7 07/20/2018   LDLCALC 94 07/20/2018   CREATININE 0.78 07/20/2018      Lab on 07/20/2018  Component Date Value Ref Range Status  . Microalb, Ur 07/20/2018 <0.7  0.0 - 1.9 mg/dL Final  . Creatinine,U 07/20/2018 81.3  mg/dL Final  .  Microalb Creat Ratio 07/20/2018 0.9  0.0 - 30.0 mg/g Final  . Cholesterol 07/20/2018 187  0 - 200 mg/dL Final   ATP III Classification       Desirable:  < 200 mg/dL               Borderline High:  200 - 239 mg/dL          High:  > = 240 mg/dL  . Triglycerides 07/20/2018 39.0  0.0 - 149.0 mg/dL Final   Normal:  <150 mg/dLBorderline High:  150 - 199 mg/dL  . HDL 07/20/2018 84.80  >39.00 mg/dL Final  . VLDL 07/20/2018 7.8  0.0 - 40.0 mg/dL Final  . LDL Cholesterol 07/20/2018 94  0 - 99 mg/dL Final  . Total CHOL/HDL Ratio 07/20/2018 2   Final                  Men          Women1/2 Average Risk     3.4          3.3Average Risk          5.0          4.42X Average Risk          9.6          7.13X Average Risk          15.0          11.0                       . NonHDL 07/20/2018 101.70   Final   NOTE:  Non-HDL goal should be 30 mg/dL higher than patient's LDL goal (i.e. LDL goal of < 70 mg/dL, would have non-HDL goal of < 100 mg/dL)  . Sodium 07/20/2018 137  135 - 145 mEq/L Final  . Potassium 07/20/2018 4.3  3.5 - 5.1 mEq/L Final  . Chloride 07/20/2018 103  96 - 112 mEq/L Final  . CO2 07/20/2018 27  19 - 32 mEq/L Final  . Glucose, Bld 07/20/2018 102* 70 - 99 mg/dL Final  . BUN 07/20/2018 11  6 - 23 mg/dL Final  . Creatinine, Ser 07/20/2018 0.78  0.40 - 1.20 mg/dL Final  . Total Bilirubin 07/20/2018 1.2  0.2 - 1.2 mg/dL Final  . Alkaline Phosphatase 07/20/2018 64  39 - 117 U/L Final  . AST 07/20/2018 17  0 - 37 U/L Final  . ALT 07/20/2018 15  0 - 35 U/L Final  . Total Protein 07/20/2018 6.9  6.0 - 8.3 g/dL Final  . Albumin 07/20/2018 4.0  3.5 - 5.2 g/dL Final  . Calcium 07/20/2018 9.2  8.4 - 10.5 mg/dL Final  . GFR 07/20/2018 103.14  >60.00 mL/min Final  . Hgb A1c MFr Bld 07/20/2018 7.7* 4.6 - 6.5 % Final   Glycemic Control Guidelines for People with Diabetes:Non Diabetic:  <6%Goal of Therapy: <7%Additional Action Suggested:  >8%     Allergies as of 07/22/2018      Reactions   Other Anaphylaxis   Peaches , tomatoes, strawberry   Strawberry Extract Hives   Facial hives and mouth hives   Tomato Hives   Facial hives and mouth hives      Medication List        Accurate as of 07/22/18  1:10 PM. Always use your most recent med list.          BD PEN NEEDLE NANO U/F 32G X 4 MM  Misc Generic drug:  Insulin Pen Needle USE AS DIRECTED   blood glucose meter kit and supplies Kit Use as instructed to test blood sugar. DX: E11.09   DULoxetine 60 MG capsule Commonly known as:  CYMBALTA Take 60 mg by mouth daily.   FARXIGA 5 MG Tabs tablet Generic drug:  dapagliflozin propanediol TAKE 1 TABLET BY MOUTH EVERY DAY **REPLACES JARDIANCE**   FREESTYLE LIBRE 14 DAY SENSOR Misc 1 UNITS BY DOES NOT APPLY ROUTE EVERY 14 (FOURTEEN)  DAYS.   FUSION PLUS PO Take 1 capsule by mouth daily. Takes for anemia   multivitamin with minerals Tabs tablet Take 1 tablet by mouth daily.   NOVOLOG FLEXPEN 100 UNIT/ML FlexPen Generic drug:  insulin aspart INJECT 4 UNITS BEFORE MEALS AS DIRECTED   ONE TOUCH ULTRA TEST test strip Generic drug:  glucose blood USE AS INSTRUCTED TO CHECK BLOOD SUGAR 2 TIMES A DAY   tranexamic acid 650 MG Tabs tablet Commonly known as:  LYSTEDA   TRESIBA FLEXTOUCH 100 UNIT/ML Sopn FlexTouch Pen Generic drug:  insulin degludec INJECT 10 UNITS INTO THE SKIN DAILY,ADJUST DOSE AS DIRECTED TO KEEP FASTING GLUCOSE BETWEEN 90-130       Allergies:  Allergies  Allergen Reactions  . Other Anaphylaxis    Peaches , tomatoes, strawberry  . Strawberry Extract Hives    Facial hives and mouth hives  . Tomato Hives    Facial hives and mouth hives    Past Medical History:  Diagnosis Date  . Back pain   . Diabetes mellitus   . Fibroids   . PCOS (polycystic ovarian syndrome)     Past Surgical History:  Procedure Laterality Date  . BREAST EXCISIONAL BIOPSY Right   . BREAST SURGERY     benign  . UTERINE FIBROID SURGERY      Family History  Problem Relation Age of Onset  . Hyperlipidemia Other   . Hypertension Other   . Stroke Other   . Cancer Other   . Diabetes Maternal Uncle   . Heart disease Paternal Uncle   . Diabetes Maternal Grandmother   . Heart disease Maternal Grandmother   . Breast cancer Maternal Grandmother        diagnosed in her 36's  . Diabetes Paternal Grandmother     Social History:  reports that she has never smoked. She has never used smokeless tobacco. She reports that she does not drink alcohol or use drugs.  Review of Systems -  Has high LDL at times, now appears to have improved level  She has cut back significantly on cheese and watching dairy products usually, is vegetarian   Lab Results  Component Value Date   CHOL 187 07/20/2018   HDL 84.80 07/20/2018    LDLCALC 94 07/20/2018   TRIG 39.0 07/20/2018   CHOLHDL 2 07/20/2018       Examination:   BP 116/72 (BP Location: Left Arm, Patient Position: Sitting, Cuff Size: Normal)   Pulse 76   Ht 5' 5" (1.651 m)   Wt 142 lb (64.4 kg)   SpO2 98%   BMI 23.63 kg/m   Body mass index is 23.63 kg/m.     Assesment:   DIABETES type 2:  See history of present illness for detailed discussion of  current management, blood sugar patterns and problems identified  A1c is 7.7 and needing to be consistently high now  She is on basal bolus insulin regimen and Wilder Glade  She is having hyperglycemia more consistently after meals  as discussed above Blood sugars have been very significantly high and periodically over 300 also after meals with 11% of her readings in early August over 250 Some of her hyperglycemia is related to missed boluses Also likely appearing to be needing NovoLog with every meal containing carbohydrate now Also her coverage for her meals is usually inadequate with at least significant hyperglycemia after lunch and dinner with her 1: 15 estimate of carbohydrate coverage She is still not exercising regularly   Recommendations today: She will use 1: 10 carbohydrate coverage to calculate her NovoLog She will need to take insulin for every meal No change in [place] since fasting readings are fairly good Explained to her the use of the insulin pump which would allow her to be better compliant with her mealtime doses and also make it simpler to do her mealtime doses and allow her to be more flexible without injections Showed her the concept of the Omnipod insulin pump and she will take the sample unit and information home She will consider this and call us if interested Meanwhile try to use a freestyle libre consistently To call if she has persistent hyperglycemia  LIPIDS: Her LDL is below 100 currently and will continue to monitor  Counseling time on subjects discussed in assessment  and plan sections is over 50% of today's 25 minute visit      Patient Instructions  Carb coverage divide by 10 for insulin dose       Ajay Kumar 07/22/2018, 1:10 PM   Note: This office note was prepared with Estate agent. Any transcriptional errors that result from this process are unintentional.

## 2018-07-22 NOTE — Patient Instructions (Signed)
Carb coverage divide by 10 for insulin dose

## 2018-07-29 ENCOUNTER — Ambulatory Visit
Admission: RE | Admit: 2018-07-29 | Discharge: 2018-07-29 | Disposition: A | Discharge status: Home / Self Care | Payer: BC Managed Care – PPO | Source: Ambulatory Visit | Attending: Physician Assistant | Admitting: Physician Assistant

## 2018-07-29 DIAGNOSIS — R109 Unspecified abdominal pain: Secondary | ICD-10-CM

## 2018-07-29 MED ORDER — IOPAMIDOL (ISOVUE-300) INJECTION 61%
100.0000 mL | Freq: Once | INTRAVENOUS | Status: AC | PRN
  Administered 2018-07-29: 100 mL via INTRAVENOUS

## 2018-08-02 ENCOUNTER — Other Ambulatory Visit: Payer: Self-pay

## 2018-08-15 ENCOUNTER — Encounter: Payer: Self-pay | Admitting: Dietician

## 2018-08-15 ENCOUNTER — Encounter: Payer: BC Managed Care – PPO | Attending: Physician Assistant | Admitting: Dietician

## 2018-08-15 DIAGNOSIS — E1165 Type 2 diabetes mellitus with hyperglycemia: Secondary | ICD-10-CM

## 2018-08-15 DIAGNOSIS — Z713 Dietary counseling and surveillance: Secondary | ICD-10-CM | POA: Insufficient documentation

## 2018-08-15 DIAGNOSIS — Z794 Long term (current) use of insulin: Secondary | ICD-10-CM | POA: Insufficient documentation

## 2018-08-15 NOTE — Progress Notes (Signed)
Diabetes Self-Management Education  Visit Type:  Follow-up  Appt. Start Time: 0950 Appt. End Time: 1025  08/19/2018  [name], identified by name and date of birth, is a 42 y.o. female with a diagnosis of Diabetes:  Type 2 Diabetes. Patient had followed a vegetarian/pescitarian diet. She is alergic to al red fruits especially tomatoes which cause hives. Weight 142 lbs today.  Increased from 139 lbs. 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   Highest weight 149 lbs Lowest weight 110 lbs when she went on a vegan diet in the past. Medications include Farxiga, Tresiba, Novolog, She is only taking the Novolog before dinner.  Sometimes will take Novolog post meal. Continues to use the [facility] and based on 9/24/appointment she was in range 66% of the time with an average blood sugar of 137 which was less than 160 on previous visit.  Her CHO ratio is 1:10.  Patient lives with her husband who works 2 jobs Set designer).  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 She also has 4 boys who are adopted ages 52, 70, 5, and 8. She does the shopping and cooking. She feels that she is having a hard time taking care of her diabetes and self care but has now started to leave work on time to work on this balance.  During this visit today, discussed the need for the Novolog pre meal and her insulin needs in general.  She now states that she is interested in starting the OmniPod insulin pump and after her MD appointment 9/24 insurance inquiry has been started.  Discussed option of student cafeteria (very convenient but she has to sit and eat as they do not have take out) and healthier options available for good vegetarian meals).  She is receptive to this.  Discussed taking the insulin  before as she has been skipping this.  She states that recently she went to a family reunion and ate increased amounts including desserts.  She verbalized desire to make lifestyle changes but challenged with this at times.  She has been listening to Podcasts on Vegan eating to motivate healthier food choices as well as taking quizzes and listening to videos on the vegan society.  I continued to encourage behavior change.  ASSESSMENT  Weight 142 lb (64.4 kg). Body mass index is 23.63 kg/m.   Diabetes Self-Management Education - 08/19/18 1309      Psychosocial Assessment   Patient Concerns  Nutrition/Meal planning;Glycemic Control    Special Needs  None    Preferred Learning Style  No preference indicated    Learning Readiness  Ready      Pre-Education Assessment   Patient understands the diabetes disease and treatment process.  Demonstrates understanding / competency    Patient understands incorporating nutritional management into lifestyle.  Needs Review    Patient undertands incorporating physical activity into lifestyle.  Demonstrates understanding / competency    Patient understands using medications safely.  Demonstrates understanding / competency    Patient understands monitoring blood glucose, interpreting and using results  Demonstrates understanding / competency    Patient understands prevention, detection, and treatment of acute complications.  Demonstrates understanding / competency    Patient understands prevention, detection, and treatment of chronic complications.  Demonstrates understanding / competency  Patient understands how to develop strategies to address psychosocial issues.  Demonstrates understanding / competency    Patient understands how to develop strategies to promote health/change behavior.  Demonstrates understanding / competency      Complications   Last HgB A1C per patient/outside source  7.7 %   07/20/18 increased from 7.5% 04/15/18     Dietary Intake    Breakfast  Greek yogurt sometimes with a banana    Lunch  burger or other out of the cafeteria to go    [employer], starch vegetable    Beverage(s)  water, green tea unsweetened      Exercise   Exercise Type  Light (walking / raking leaves)      Patient Education   Previous Diabetes Education  Yes (please comment)    Nutrition management   Meal timing in regards to the patients' current diabetes medication.;Meal options for control of blood glucose level and chronic complications.;Information on hints to eating out and maintain blood glucose control.    Physical activity and exercise   Role of exercise on diabetes management, blood pressure control and cardiac health.    Medications  Reviewed patients medication for diabetes, action, purpose, timing of dose and side effects.    Monitoring  Purpose and frequency of SMBG.    Psychosocial adjustment  Worked with patient to identify barriers to care and solutions      Individualized Goals (developed by patient)   Nutrition  General guidelines for healthy choices and portions discussed    Physical Activity  Exercise 5-7 days per week;30 minutes per day    Medications  take my medication as prescribed    Monitoring   test my blood glucose as discussed    Reducing Risk  Other (comment)   increase vegetable intake and variety   Health Coping  discuss diabetes with (comment)   MD, RD, CDE     Patient Self-Evaluation of Goals - Patient rates self as meeting previously set goals (% of time)   Nutrition  >75%    Physical Activity  >75%    Medications  >75%    Monitoring  >75%    Problem Solving  >75%    Reducing Risk  >75%    Health Coping  >75%      Post-Education Assessment   Patient understands the diabetes disease and treatment process.  Demonstrates understanding / competency    Patient understands incorporating nutritional management into lifestyle.  Demonstrates understanding / competency    Patient undertands incorporating  physical activity into lifestyle.  Demonstrates understanding / competency    Patient understands using medications safely.  Demonstrates understanding / competency    Patient understands monitoring blood glucose, interpreting and using results  Demonstrates understanding / competency    Patient understands prevention, detection, and treatment of acute complications.  Demonstrates understanding / competency    Patient understands prevention, detection, and treatment of chronic complications.  Demonstrates understanding / competency    Patient understands how to develop strategies to address psychosocial issues.  Demonstrates understanding / competency    Patient understands how to develop strategies to promote health/change behavior.  Needs Review      Outcomes   Program Status  Completed      Subsequent Visit   Since your last visit have you continued or begun to take your medications as prescribed?  Yes    Since your last visit have you experienced any weight changes?  Gain    Weight Gain (  lbs)  3    Since your last visit, are you checking your blood glucose at least once a day?  Yes       Learning Objective:  Patient will have a greater understanding of diabetes self-management. Patient education plan is to attend individual and/or group sessions per assessed needs and concerns.   Plan:   Patient Instructions  Remember to take your medication  Take the insulin before dinner  Mindfulness  Food Choices (Consider Student cafeteria for more choices)  Sitting down without distraction  Eating slowly  Enjoy your food  Stop when satisfied  Listen to our body         Expected Outcomes:  Demonstrated interest in learning. Expect positive outcomes  Education material provided:   If problems or questions, patient to contact team via:  Phone  Future DSME appointment: - 3-4 months

## 2018-08-15 NOTE — Patient Instructions (Addendum)
Remember to take your medication  Take the insulin before dinner  Mindfulness  Food Choices (Consider Student cafeteria for more choices)  Sitting down without distraction  Eating slowly  Enjoy your food  Stop when satisfied  Listen to our body

## 2018-08-16 ENCOUNTER — Encounter: Payer: Self-pay | Admitting: Endocrinology

## 2018-08-16 ENCOUNTER — Ambulatory Visit: Payer: BC Managed Care – PPO | Admitting: Endocrinology

## 2018-08-16 VITALS — BP 108/64 | HR 68 | Ht 65.0 in | Wt 143.0 lb

## 2018-08-16 DIAGNOSIS — Z794 Long term (current) use of insulin: Secondary | ICD-10-CM

## 2018-08-16 DIAGNOSIS — E1165 Type 2 diabetes mellitus with hyperglycemia: Secondary | ICD-10-CM

## 2018-08-16 NOTE — Progress Notes (Signed)
[name], female   DOB: [birth year], 42 y.o.   MRN: [id number]    Reason for Appointment: Diabetes follow-up   History of Present Illness   Diagnosis: date of diagnosis: 2009, initially asymptomatic, has type 2 diabetes.   RECENT history:     A1c is last 7.7 and gradually increasing, was down to 6.7 in in February  Current treatment regimen:  Tresiba 10 units daily, NovoLog usually 1: 10u carbohydrate coverage, Farxiga 5 mg daily  Blood sugar patterns, management and problems identified:  Her average blood sugar is 137 compared to 160 on her last visit on her 2-week freestyle [employer]  She has been told to try and take NovoLog every meal including lunch since her last visit and go up to 1: 10 on her carbohydrate ratio  This was because of her having mostly high postprandial readings with 1: 15 carbohydrate coverage  Also not always consistently taking her mealtime dose before eating  Although she has been doing better with pre-meal NovoLog dosing this is not consistent again  She is still having some difficulty accurately estimating her carbohydrate intake but is doing better  Previously HIGHEST blood sugars were after evening meal but overall this has significantly improved with average late at night 171  This may be partly related to occasional missed boluses in the evenings  She does tend to have still occasional HYPOGLYCEMIA with taking her NovoLog late even though she has been told not to do so, she has had one occurrence during the night also  Still has tendency to low sugars with 10% of her readings over 70 mostly related to low sugars overnight sporadically  HIGHEST blood sugars are now overall after lunch although quite variable  She now says that she is adjusting her TRESIBA at night if she is eating larger or smaller meals instead of looking at her fasting patterns  FASTING readings are usually fairly good but somewhat  depending on how her sugars do the night before  She has seen the dietitian and is trying to incorporate more protein, drinking a smoothie in the morning with protein  Side effects from medications: Diarrhea from metformin and abdominal discomfort  Glucometer:  One Touch ultra 2 and FreeStyle libre .  CGM use % of time 76  Average and SD  137  Time in range        66%  % Time Above 180  24  % Time above 250  4  % Time Below target  10      PRE-MEAL Fasting Lunch Dinner Bedtime Overall  Glucose range:       Mean/median:  98  117  149  171  137   POST-MEAL PC Breakfast PC Lunch PC Dinner  Glucose range:     Mean/median:  108  157  148   PREVIOUS readings:  PRE-MEAL Fasting Lunch Dinner Bedtime Overall  Glucose range:  103-169    59-289   Mean/median:  132  162   161 160   POST-MEAL PC Breakfast PC Lunch PC Dinner  Glucose range:   195-474   Mean/median:   172  169    Meals: 3 meals per day, vegetarian, tries to get protein like nuts, [name] yogurt, soy, fish  Dinner usually at 7-8 pm.Cheese sandwiich, or vegetable soup with cheese, may have 1-2 starches per meal    Sometimes will have  peanut butter sandwich for lunch Occasionally will have oatmeal at breakfast with some  dairy product   Physical activity: exercise: 6 -pm, 3/7, was walking stairs, upto 10,000 steps per day  Dietician visit: Most recent: 03/2013  CDE visit: 10/15    Wt Readings from Last 3 Encounters:  08/16/18 143 lb (64.9 kg)  08/15/18 142 lb (64.4 kg)  07/22/18 142 lb (64.4 kg)    LABS:  Lab Results  Component Value Date   HGBA1C 7.7 (H) 07/20/2018   HGBA1C 7.5 (H) 04/15/2018   HGBA1C 6.7 (H) 01/14/2018   Lab Results  Component Value Date   MICROALBUR <0.7 07/20/2018   LDLCALC 94 07/20/2018   CREATININE 0.78 07/20/2018      No visits with results within 1 Week(s) from this visit.  Latest known visit with results is:  Lab on 07/20/2018  Component Date Value Ref Range Status  .  Microalb, Ur 07/20/2018 <0.7  0.0 - 1.9 mg/dL Final  . Creatinine,U 07/20/2018 81.3  mg/dL Final  . Microalb Creat Ratio 07/20/2018 0.9  0.0 - 30.0 mg/g Final  . Cholesterol 07/20/2018 187  0 - 200 mg/dL Final   ATP III Classification       Desirable:  < 200 mg/dL               Borderline High:  200 - 239 mg/dL          High:  > = 240 mg/dL  . Triglycerides 07/20/2018 39.0  0.0 - 149.0 mg/dL Final   Normal:  <150 mg/dLBorderline High:  150 - 199 mg/dL  . HDL 07/20/2018 84.80  >39.00 mg/dL Final  . VLDL 07/20/2018 7.8  0.0 - 40.0 mg/dL Final  . LDL Cholesterol 07/20/2018 94  0 - 99 mg/dL Final  . Total CHOL/HDL Ratio 07/20/2018 2   Final                  Men          Women1/2 Average Risk     3.4          3.3Average Risk          5.0          4.42X Average Risk          9.6          7.13X Average Risk          15.0          11.0                      . NonHDL 07/20/2018 101.70   Final   NOTE:  Non-HDL goal should be 30 mg/dL higher than patient's LDL goal (i.e. LDL goal of < 70 mg/dL, would have non-HDL goal of < 100 mg/dL)  . Sodium 07/20/2018 137  135 - 145 mEq/L Final  . Potassium 07/20/2018 4.3  3.5 - 5.1 mEq/L Final  . Chloride 07/20/2018 103  96 - 112 mEq/L Final  . CO2 07/20/2018 27  19 - 32 mEq/L Final  . Glucose, Bld 07/20/2018 102* 70 - 99 mg/dL Final  . BUN 07/20/2018 11  6 - 23 mg/dL Final  . Creatinine, Ser 07/20/2018 0.78  0.40 - 1.20 mg/dL Final  . Total Bilirubin 07/20/2018 1.2  0.2 - 1.2 mg/dL Final  . Alkaline Phosphatase 07/20/2018 64  39 - 117 U/L Final  . AST 07/20/2018 17  0 - 37 U/L Final  . ALT 07/20/2018 15  0 - 35 U/L Final  . Total Protein 07/20/2018 6.9  6.0 - 8.3 g/dL Final  . Albumin 07/20/2018 4.0  3.5 - 5.2 g/dL Final  . Calcium 07/20/2018 9.2  8.4 - 10.5 mg/dL Final  . GFR 07/20/2018 103.14  >60.00 mL/min Final  . Hgb A1c MFr Bld 07/20/2018 7.7* 4.6 - 6.5 % Final   Glycemic Control Guidelines for People with Diabetes:Non Diabetic:  <6%Goal of Therapy:  <7%Additional Action Suggested:  >8%     Allergies as of 08/16/2018      Reactions   Other Anaphylaxis   Peaches , tomatoes, strawberry   Strawberry Extract Hives   Facial hives and mouth hives   Tomato Hives   Facial hives and mouth hives      Medication List        Accurate as of 08/16/18  9:13 PM. Always use your most recent med list.          BD PEN NEEDLE NANO U/F 32G X 4 MM Misc Generic drug:  Insulin Pen Needle USE AS DIRECTED   blood glucose meter kit and supplies Kit Use as instructed to test blood sugar. DX: E11.09   DULoxetine 60 MG capsule Commonly known as:  CYMBALTA Take 60 mg by mouth daily.   FARXIGA 5 MG Tabs tablet Generic drug:  dapagliflozin propanediol TAKE 1 TABLET BY MOUTH EVERY DAY **REPLACES JARDIANCE**   FREESTYLE LIBRE 14 DAY SENSOR Misc 1 UNITS BY DOES NOT APPLY ROUTE EVERY 14 (FOURTEEN) DAYS.   FUSION PLUS PO Take 1 capsule by mouth daily. Takes for anemia   multivitamin with minerals Tabs tablet Take 1 tablet by mouth daily.   NOVOLOG FLEXPEN 100 UNIT/ML FlexPen Generic drug:  insulin aspart INJECT 4 UNITS BEFORE MEALS AS DIRECTED   ONE TOUCH ULTRA TEST test strip Generic drug:  glucose blood USE AS INSTRUCTED TO CHECK BLOOD SUGAR 2 TIMES A DAY   tranexamic acid 650 MG Tabs tablet Commonly known as:  LYSTEDA   TRESIBA FLEXTOUCH 100 UNIT/ML Sopn FlexTouch Pen Generic drug:  insulin degludec INJECT 10 UNITS INTO THE SKIN DAILY,ADJUST DOSE AS DIRECTED TO KEEP FASTING GLUCOSE BETWEEN 90-130       Allergies:  Allergies  Allergen Reactions  . Other Anaphylaxis    Peaches , tomatoes, strawberry  . Strawberry Extract Hives    Facial hives and mouth hives  . Tomato Hives    Facial hives and mouth hives    Past Medical History:  Diagnosis Date  . Back pain   . Diabetes mellitus   . Fibroids   . PCOS (polycystic ovarian syndrome)     Past Surgical History:  Procedure Laterality Date  . BREAST EXCISIONAL BIOPSY Right    . BREAST SURGERY     benign  . UTERINE FIBROID SURGERY      Family History  Problem Relation Age of Onset  . Hyperlipidemia Other   . Hypertension Other   . Stroke Other   . Cancer Other   . Diabetes Maternal Uncle   . Heart disease Paternal Uncle   . Diabetes Maternal Grandmother   . Heart disease Maternal Grandmother   . Breast cancer Maternal Grandmother        diagnosed in her 87's  . Diabetes Paternal Grandmother     Social History:  reports that she has never smoked. She has never used smokeless tobacco. She reports that she does not drink alcohol or use drugs.  Review of Systems -  Has high LDL at times, now appears to have improved level  She  has cut back significantly on cheese and watching dairy products usually, is vegetarian   Lab Results  Component Value Date   CHOL 187 07/20/2018   HDL 84.80 07/20/2018   LDLCALC 94 07/20/2018   TRIG 39.0 07/20/2018   CHOLHDL 2 07/20/2018       Examination:   BP 108/64   Pulse 68   Ht '5\' 5"'$  (1.651 m)   Wt 143 lb (64.9 kg)   SpO2 95%   BMI 23.80 kg/m   Body mass index is 23.8 kg/m.    Assesment:   DIABETES type 2:  See history of present illness for detailed discussion of  current management, blood sugar patterns and problems identified  A1c is 7.7 on the last visit  Her blood sugars from freestyle [employer] were reviewed in detail and discussed with patient Also discussed the today management of her insulin, both basal and bolus She does have overall better blood sugars and is trying to understand her management better Appears to be fairly insulin deficient now and needing basal bolus insulin regimen, not needing a bolus at breakfast because of getting a protein shake Average blood sugar is better compared to her last visit and has less extreme postprandial hypoglycemia   Recommendations today: No change in carbohydrate ratio Discussed timing of mealtime insulin Needs to avoid postprandial  NovoLog dosing especially more than 15 minutes after the meals or during the night even if blood sugars are high Needs to keep her to see but consistent every day and reduce it if morning sugars tend to be relatively low more often Regular exercise Information on the Omnipod insulin pump again reviewed and will proceed with getting the insurance verification and start training when able to    Patient Instructions  10 Tyler Aas     Counseling time on subjects discussed in assessment and plan sections is over 50% of today's 25 minute visit   Elayne Snare 08/16/2018, 9:13 PM   Note: This office note was prepared with Dragon voice recognition system technology. Any transcriptional errors that result from this process are unintentional.

## 2018-08-16 NOTE — Patient Instructions (Signed)
Mia Wilson

## 2018-09-06 ENCOUNTER — Telehealth: Payer: Self-pay | Admitting: Endocrinology

## 2018-09-06 NOTE — Telephone Encounter (Signed)
Patient called wanting to be put on the list for insulin training pump. Omnipod Dash insulin pump. Best contact [phone]

## 2018-09-13 ENCOUNTER — Other Ambulatory Visit: Payer: Self-pay | Admitting: Obstetrics and Gynecology

## 2018-09-13 DIAGNOSIS — D250 Submucous leiomyoma of uterus: Secondary | ICD-10-CM

## 2018-09-14 ENCOUNTER — Ambulatory Visit
Admission: RE | Admit: 2018-09-14 | Discharge: 2018-09-14 | Disposition: A | Discharge status: Home / Self Care | Payer: BC Managed Care – PPO | Source: Ambulatory Visit | Attending: Obstetrics and Gynecology | Admitting: Obstetrics and Gynecology

## 2018-09-14 ENCOUNTER — Encounter: Payer: Self-pay | Admitting: Radiology

## 2018-09-14 ENCOUNTER — Other Ambulatory Visit (HOSPITAL_COMMUNITY): Payer: Self-pay | Admitting: Interventional Radiology

## 2018-09-14 DIAGNOSIS — D219 Benign neoplasm of connective and other soft tissue, unspecified: Secondary | ICD-10-CM

## 2018-09-14 DIAGNOSIS — D250 Submucous leiomyoma of uterus: Secondary | ICD-10-CM

## 2018-09-14 HISTORY — PX: IR RADIOLOGIST EVAL & MGMT: IMG5224

## 2018-09-14 NOTE — Consult Note (Signed)
Chief Complaint: Symptomatic uterine fibroids.  Referring Physician(s): Cole,Tara  History of Present Illness: Mia Wilson is a 42 y.o. (G1, P0 - patient has 4 adopted sons) female with past medical history significant for type 2 diabetes and polycystic ovarian syndrome who presents today for evaluation and management for symptomatic uterine fibroids.  The patient is unaccompanied and serves as her own historian.  Intraoperative pelvic ultrasound performed 08/17/2018 reports multiple uterine fibroids.  This was also confirmed on subsequent CT scan of the abdomen and pelvis performed 07/29/2018 for the evaluation of abdominal pain, constipation and nausea.  Patient states she has a long history of menorrhagia dating back to her teens.  Patient states that her cycle has remained regular occurring every 26 - 28 days lasting approximately 6 - 7 days, 5 days of which are associated with marked menorrhagia including the passage of clots and necessitating the changing of pads and tampons every 1 to 1-1/2 hours.  Patient has been previously diagnosed with anemia though has not received a blood transfusion.  Patient is currently on a multivitamin with iron supplementation.  Patient has previously undergone a myomectomy in 2004 which did improve her pelvic cramping though did little to alleviate her menorrhagia.  Patient admits to bulk symptoms including constipation, abdominal pressure and urinary frequency and urgency.  Patient wishes to avoid a surgical procedure as her mother had a prolonged recovery following hysterectomy many years ago.    Additionally, while not actively trying, the patient likes the idea of preserved fertility (as above, the patient has 4 adopted sons).  Patient underwent a Pap smear on 02/03/2018 with negative results.  Patient has not undergone endometrial biopsy.      Past Medical History:  Diagnosis Date  . Back pain   . Diabetes mellitus   . Fibroids   .  PCOS (polycystic ovarian syndrome)     Past Surgical History:  Procedure Laterality Date  . BREAST EXCISIONAL BIOPSY Right   . BREAST SURGERY     benign  . UTERINE FIBROID SURGERY      Allergies: Other; Strawberry extract; and Tomato  Medications: Prior to Admission medications   Medication Sig Start Date End Date Taking? Authorizing Provider  BD PEN NEEDLE NANO U/F 32G X 4 MM MISC USE AS DIRECTED 06/07/18  Yes Elayne Snare, MD  blood glucose meter kit and supplies KIT Use as instructed to test blood sugar. 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 03/03/18  Yes Elayne Snare, MD  DULoxetine (CYMBALTA) 60 MG capsule Take 60 mg by mouth daily.   Yes [provider]  FARXIGA 5 MG TABS tablet TAKE 1 TABLET BY MOUTH EVERY DAY **REPLACES JARDIANCE** 07/02/18  Yes Elayne Snare, MD  Iron-FA-B Cmp-C-Biot-Probiotic (FUSION PLUS PO) Take 1 capsule by mouth daily. Takes for anemia   Yes [provider]  Multiple Vitamin (MULTIVITAMIN WITH MINERALS) TABS Take 1 tablet by mouth daily.   Yes [provider]  NOVOLOG FLEXPEN 100 UNIT/ML FlexPen INJECT 4 UNITS BEFORE MEALS AS DIRECTED 07/04/18  Yes Elayne Snare, MD  ONE TOUCH ULTRA TEST test strip USE AS INSTRUCTED TO CHECK BLOOD SUGAR 2 TIMES A DAY 02/02/18  Yes Elayne Snare, MD  tranexamic acid (LYSTEDA) 650 MG TABS tablet  03/12/17  Yes [provider]  TRESIBA FLEXTOUCH 100 UNIT/ML SOPN FlexTouch Pen INJECT 10 UNITS INTO  THE SKIN DAILY,ADJUST DOSE AS DIRECTED TO KEEP FASTING GLUCOSE BETWEEN 90-130 04/05/17  Yes Elayne Snare, MD     Family History  Problem Relation Age of Onset  . Hyperlipidemia Other   . Hypertension Other   . Stroke Other   . Cancer Other   . Diabetes Maternal Uncle   . Heart disease Paternal Uncle   . Diabetes Maternal Grandmother   . Heart disease Maternal Grandmother   .  Breast cancer Maternal Grandmother        diagnosed in her 21's  . Diabetes Paternal Grandmother     Social History   Socioeconomic History  . Marital status: Married    Spouse name: Not on file  . Number of children: Not on file  . Years of education: Not on file  . Highest education level: Not on file  Occupational History  . Not on file  Social Needs  . Financial resource strain: Not on file  . Food insecurity:    Worry: Not on file    Inability: Not on file  . Transportation needs:    Medical: Not on file    Non-medical: Not on file  Tobacco Use  . Smoking status: Never Smoker  . Smokeless tobacco: Never Used  Substance and Sexual Activity  . Alcohol use: No  . Drug use: No  . Sexual activity: Yes  Lifestyle  . Physical activity:    Days per week: Not on file    Minutes per session: Not on file  . Stress: Not on file  Relationships  . Social connections:    Talks on phone: Not on file    Gets together: Not on file    Attends religious service: Not on file    Active member of club or organization: Not on file    Attends meetings of clubs or organizations: Not on file    Relationship status: Not on file  Other Topics Concern  . Not on file  Social History Narrative   Patient moved here from [place] August of 2012. She is not employed at this time. She has been her 2 youngest adopted children at home. Not involved a lot else here at this time. She has 2 other children also. She walks a little bit but does not do a lot since she has left her with fatigue.    ECOG Status: 1 - Symptomatic but completely ambulatory  Review of Systems: A 12 point ROS discussed and pertinent positives are indicated in the HPI above.  All other systems are negative.  Review of Systems  Constitutional: Negative for activity change and fatigue.  Respiratory: Negative.   Cardiovascular: Negative.   Gastrointestinal: Positive for constipation.  Genitourinary: Positive for frequency,  menstrual problem and pelvic pain.    Vital Signs: BP 111/71   Pulse 73   Temp 98.3 F (36.8 C) (Oral)   Resp 15   Ht 5' 5.5" (1.664 m)   Wt 64.4 kg   LMP 08/18/2018 (Exact Date)   SpO2 100%   BMI 23.27 kg/m   Physical Exam  Constitutional: She appears well-developed and well-nourished.  HENT:  Head: Normocephalic and atraumatic.  Cardiovascular: Normal rate and regular rhythm.  Pulmonary/Chest: Effort normal and breath sounds normal.  Abdominal: There is tenderness.  Patient reports tenderness with palpation of the lower abdomen/upper pelvis. I am unable to definitively palpate the uterus.  Psychiatric: She has a normal mood and affect. Her behavior is normal.  Nursing note and vitals reviewed.  Imaging:  CT scan abdomen pelvis-07/29/2018-multiple surgeries enhancing uterine fibroid  Labs:  CBC: No results for input(s): WBC, HGB, HCT, PLT in the last 8760 hours.  COAGS: No results for input(s): INR, APTT in the last 8760 hours.  BMP: Recent Labs    10/11/17 1328 01/14/18 0810 04/15/18 1350 07/20/18 0827  NA 134* 139 140 137  K 4.0 3.9 3.5 4.3  CL 99 104 106 103  CO2 '27 28 26 27  '$ GLUCOSE 130* 83 74 102*  BUN '13 12 13 11  '$ CALCIUM 9.8 9.1 9.1 9.2  CREATININE 0.65 0.70 0.70 0.78    LIVER FUNCTION TESTS: Recent Labs    04/15/18 1350 07/20/18 0827  BILITOT 1.0 1.2  AST 16 17  ALT 13 15  ALKPHOS 67 64  PROT 6.8 6.9  ALBUMIN 3.9 4.0    TUMOR MARKERS: No results for input(s): AFPTM, CEA, CA199, CHROMGRNA in the last 8760 hours.  Assessment and Plan:  Ruthmary Occhipinti is a 42 y.o. (G1, P0 - patient has 4 adopted sons) female with past medical history significant for type 2 diabetes and polycystic ovarian syndrome who presents today for evaluation and management for symptomatic uterine fibroids.    As above, the patient's primary fibroid related complaint is associated with her marked menorrhagia including the passage of clots and necessitating the  changing of pads and tampons every 1 to 1-1/2 hours, however additionally the patient admits to bulk symptoms including constipation, abdominal pressure and urinary frequency and urgency.  Prolonged conversations were held with the patient regarding potential treatment options including continued conservative management and waiting to the onset of menopause versus proceeding with definitive hysterectomy.  Presently the patient wishes to avoid a surgical procedure (as her mom had a prolonged recovery following hysterectomy many years ago) however wishes to undergo an intervention and not wait until the onset of menopause.  As such, prolonged conversations were held with the patient regarding the benefits and risks (including but not limited to bleeding, infection and nontarget embolization) of uterine artery embolization.  I explained that as the patient's fibroid complaint is associated with her menorrhagia I feel she is an excellent candidate for this procedure.  I also explained that I am hopeful that a successful embolization would result in a durable result until the patient's onset of menopause.  Following this prolonged a detailed conversation, the patient wishes to pursue uterine fibroid embolization.  As such, we will proceed with arranging for a contrast-enhanced pelvic MRI (to better delineate the size and number of uterine fibroids as well as to ensure fibroid enhancement).    Additionally, we will coordinate to have the patient undergo an endometrial biopsy.  Pending the results of the above, the uterine fibroid embolization will be performed at [facility].  The procedure will entail an overnight admission for PCA usage and continued observation.  The patient was instructed to call the interventional radiology clinic with any interval questions or concerns.  Thank you for this interesting consult.  I greatly enjoyed meeting Imagene Corti and look forward to participating in  their care.  A copy of this report was sent to the requesting provider on this date.  Electronically Signed: Sandi Mariscal 09/14/2018, 9:53 AM   I spent a total of 30 Minutes in face to face in clinical consultation, greater than 50% of which was counseling/coordinating care for symptomatic uterine fibroids

## 2018-09-19 ENCOUNTER — Ambulatory Visit (HOSPITAL_COMMUNITY)
Admission: RE | Admit: 2018-09-19 | Discharge: 2018-09-19 | Disposition: A | Discharge status: Home / Self Care | Payer: BC Managed Care – PPO | Source: Ambulatory Visit | Attending: Interventional Radiology | Admitting: Interventional Radiology

## 2018-09-19 DIAGNOSIS — D219 Benign neoplasm of connective and other soft tissue, unspecified: Secondary | ICD-10-CM | POA: Diagnosis not present

## 2018-09-19 LAB — POCT I-STAT CREATININE: CREATININE: 0.7 mg/dL (ref 0.44–1.00)

## 2018-09-19 MED ORDER — GADOBUTROL 1 MMOL/ML IV SOLN
6.0000 mL | Freq: Once | INTRAVENOUS | Status: AC | PRN
  Administered 2018-09-19: 6 mL via INTRAVENOUS

## 2018-10-05 ENCOUNTER — Other Ambulatory Visit (INDEPENDENT_AMBULATORY_CARE_PROVIDER_SITE_OTHER): Payer: BC Managed Care – PPO

## 2018-10-05 DIAGNOSIS — E1165 Type 2 diabetes mellitus with hyperglycemia: Secondary | ICD-10-CM

## 2018-10-05 DIAGNOSIS — Z794 Long term (current) use of insulin: Secondary | ICD-10-CM | POA: Diagnosis not present

## 2018-10-05 LAB — BASIC METABOLIC PANEL
BUN: 12 mg/dL (ref 6–23)
CALCIUM: 8.9 mg/dL (ref 8.4–10.5)
CHLORIDE: 104 meq/L (ref 96–112)
CO2: 28 meq/L (ref 19–32)
Creatinine, Ser: 0.78 mg/dL (ref 0.40–1.20)
GFR: 103.04 mL/min (ref >60.00)
GLUCOSE: 129 mg/dL — AB (ref 70–99)
POTASSIUM: 4.3 meq/L (ref 3.5–5.1)
SODIUM: 139 meq/L (ref 135–145)

## 2018-10-05 LAB — HEMOGLOBIN A1C: Hgb A1c MFr Bld: 7.1 % — ABNORMAL HIGH (ref 4.6–6.5)

## 2018-10-06 ENCOUNTER — Other Ambulatory Visit: Payer: Self-pay

## 2018-10-11 ENCOUNTER — Encounter: Payer: Self-pay | Admitting: Endocrinology

## 2018-10-11 ENCOUNTER — Ambulatory Visit: Payer: BC Managed Care – PPO | Admitting: Endocrinology

## 2018-10-11 VITALS — BP 110/72 | HR 82 | Ht 65.0 in | Wt 139.0 lb

## 2018-10-11 DIAGNOSIS — Z794 Long term (current) use of insulin: Secondary | ICD-10-CM | POA: Diagnosis not present

## 2018-10-11 DIAGNOSIS — E1165 Type 2 diabetes mellitus with hyperglycemia: Secondary | ICD-10-CM | POA: Diagnosis not present

## 2018-10-11 NOTE — Progress Notes (Signed)
[name], female   DOB: 1976-10-11, 42 y.o.   MRN: [id number]    Reason for Appointment: Diabetes follow-up   History of Present Illness   Diagnosis: date of diagnosis: 2009, initially asymptomatic, has type 2 diabetes.   RECENT history:     A1c is 7.1; 7.7 and gradually increasing, was down to 6.7 in in February  Current treatment regimen:  Tresiba 8 units daily, NovoLog usually 1: 10u carbohydrate coverage, Farxiga 5 mg daily  Blood sugar patterns, management and problems identified:  She has not use the freestyle libre recently because of cost  Also is awaiting starting the insulin pump  Her blood sugar control is quite variable especially postprandial and no occasionally also has high fasting readings  Although she is trying to take insulin more consistently before meals based on her carbohydrate intake she still has readings as high as 326  With being vegetarian she has difficulty establishing a steady diet and continues to experiment with different foods and carbohydrates  More recently trying to be vegan  She thinks that eating rice and potatoes makes her sugar go up as well as large portions of beans  However has not had any hypoglycemia with overestimating her bolus needs  Her compliance with diet and snacks may be variable and at times will have some stress eating causing high sugars  More recently has not checked enough readings POSTPRANDIALLY  Again she has not found time for exercise  Side effects from medications: Diarrhea from metformin and abdominal discomfort  Glucometer:  One Touch ultra 2 and recently Contour   PRE-MEAL Fasting Lunch Dinner Bedtime Overall  Glucose range:  82-170      Mean/median:  127     130   POST-MEAL PC Breakfast PC Lunch PC Dinner  Glucose range:   77-307  93-326  Mean/median:    190     Meals: 3 meals per day, vegetarian, tries to get protein like nuts, [name] yogurt, soy, fish  Dinner  usually at 7-8 pm.Cheese sandwiich, or vegetable soup with cheese, may have 1-2 starches per meal Sometimes will have  peanut butter sandwich for lunch  Physical activity: exercise is minimal, mostly walking stairs, upto 6,000 steps per day  Dietician visit: Most recent: 03/2013  CDE visit: 10/15    Wt Readings from Last 3 Encounters:  10/11/18 139 lb (63 kg)  09/14/18 142 lb (64.4 kg)  08/16/18 143 lb (64.9 kg)    LABS:  Lab Results  Component Value Date   HGBA1C 7.1 (H) 10/05/2018   HGBA1C 7.7 (H) 07/20/2018   HGBA1C 7.5 (H) 04/15/2018   Lab Results  Component Value Date   MICROALBUR <0.7 07/20/2018   LDLCALC 94 07/20/2018   CREATININE 0.78 10/05/2018      Lab on 10/05/2018  Component Date Value Ref Range Status  . Sodium 10/05/2018 139  135 - 145 mEq/L Final  . Potassium 10/05/2018 4.3  3.5 - 5.1 mEq/L Final  . Chloride 10/05/2018 104  96 - 112 mEq/L Final  . CO2 10/05/2018 28  19 - 32 mEq/L Final  . Glucose, Bld 10/05/2018 129* 70 - 99 mg/dL Final  . BUN 10/05/2018 12  6 - 23 mg/dL Final  . Creatinine, Ser 10/05/2018 0.78  0.40 - 1.20 mg/dL Final  . Calcium 10/05/2018 8.9  8.4 - 10.5 mg/dL Final  . GFR 10/05/2018 103.04  >60.00 mL/min Final  . Hgb A1c MFr Bld 10/05/2018 7.1* 4.6 - 6.5 %  Final   Glycemic Control Guidelines for People with Diabetes:Non Diabetic:  <6%Goal of Therapy: <7%Additional Action Suggested:  >8%     Allergies as of 10/11/2018      Reactions   Other Anaphylaxis   Peaches , tomatoes, strawberry   Strawberry Extract Hives   Facial hives and mouth hives   Tomato Hives   Facial hives and mouth hives      Medication List        Accurate as of 10/11/18  8:36 AM. Always use your most recent med list.          BD PEN NEEDLE NANO U/F 32G X 4 MM Misc Generic drug:  Insulin Pen Needle USE AS DIRECTED   blood glucose meter kit and supplies Kit Use as instructed to test blood sugar. DX: E11.09   DULoxetine 60 MG capsule Commonly known  as:  CYMBALTA Take 60 mg by mouth daily.   FARXIGA 5 MG Tabs tablet Generic drug:  dapagliflozin propanediol TAKE 1 TABLET BY MOUTH EVERY DAY **REPLACES JARDIANCE**   FREESTYLE LIBRE 14 DAY SENSOR Misc 1 UNITS BY DOES NOT APPLY ROUTE EVERY 14 (FOURTEEN) DAYS.   FUSION PLUS PO Take 1 capsule by mouth daily. Takes for anemia   multivitamin with minerals Tabs tablet Take 1 tablet by mouth daily.   Norethin Ace-Eth Estrad-FE 1-20 MG-MCG(24) Chew TAKE 1 TABLET BY MOUTH @ SAME TIME EACH DAY   NOVOLOG FLEXPEN 100 UNIT/ML FlexPen Generic drug:  insulin aspart INJECT 4 UNITS BEFORE MEALS AS DIRECTED   ONE TOUCH ULTRA TEST test strip Generic drug:  glucose blood USE AS INSTRUCTED TO CHECK BLOOD SUGAR 2 TIMES A DAY   TRESIBA FLEXTOUCH 100 UNIT/ML Sopn FlexTouch Pen Generic drug:  insulin degludec INJECT 10 UNITS INTO THE SKIN DAILY,ADJUST DOSE AS DIRECTED TO KEEP FASTING GLUCOSE BETWEEN 90-130       Allergies:  Allergies  Allergen Reactions  . Other Anaphylaxis    Peaches , tomatoes, strawberry  . Strawberry Extract Hives    Facial hives and mouth hives  . Tomato Hives    Facial hives and mouth hives    Past Medical History:  Diagnosis Date  . Back pain   . Diabetes mellitus   . Fibroids   . PCOS (polycystic ovarian syndrome)     Past Surgical History:  Procedure Laterality Date  . BREAST EXCISIONAL BIOPSY Right   . BREAST SURGERY     benign  . IR RADIOLOGIST EVAL & MGMT  09/14/2018  . UTERINE FIBROID SURGERY      Family History  Problem Relation Age of Onset  . Hyperlipidemia Other   . Hypertension Other   . Stroke Other   . Cancer Other   . Diabetes Maternal Uncle   . Heart disease Paternal Uncle   . Diabetes Maternal Grandmother   . Heart disease Maternal Grandmother   . Breast cancer Maternal Grandmother        diagnosed in her 47's  . Diabetes Paternal Grandmother     Social History:  reports that she has never smoked. She has never used  smokeless tobacco. She reports that she does not drink alcohol or use drugs.  Review of Systems -  Has high LDL at times, recently appears to have improved level     Lab Results  Component Value Date   CHOL 187 07/20/2018   HDL 84.80 07/20/2018   LDLCALC 94 07/20/2018   TRIG 39.0 07/20/2018   CHOLHDL 2 07/20/2018  Examination:   BP 110/72   Pulse 82   Ht _0  (1.651 m)   Wt 139 lb (63 kg)   LMP 09/14/2018   SpO2 98%   BMI 23.13 kg/m   Body mass index is 23.13 kg/m.    Assesment:   DIABETES type 2:  See history of present illness for detailed discussion of  current management, blood sugar patterns and problems identified  A1c is 7.7 compared to 7.7 on the last visit  Her blood sugars are being checked on fingersticks now and she has difficulty using the freestyle libre consistently  Again she has variability in her blood sugars based on her meals, types of carbohydrate and compliance with diet and snacks However still requiring relatively low doses of basal insulin and mostly taking 8 units of Tresiba No recent hypoglycemia   Recommendations today: We will try to get her started on the Omnipod insulin pump Discussed that this would allow her to bolus more consistently and have more convenience and ability to enter carbohydrates Continue 1: 10 carbohydrate ratio More consistent postprandial monitoring She will stay on Farxiga with the pump Instruction given for pump settings In the meantime she will keep her steady dose of 8 units Tresiba    There are no Patient Instructions on file for this visit.  Counseling time on subjects discussed in assessment and plan sections is over 50% of today's 25 minute visit   Elayne Snare 10/11/2018, 8:36 AM   Note: This office note was prepared with Dragon voice recognition system technology. Any transcriptional errors that result from this process are unintentional.

## 2018-10-11 NOTE — Telephone Encounter (Signed)
Appointment made for pump start for tomorrow, per Dr. Ronnie Derby request

## 2018-10-11 NOTE — Patient Instructions (Signed)
Need to check more blood sugars mid afternoon and 2 to 3 hours after evening meal

## 2018-10-12 ENCOUNTER — Other Ambulatory Visit: Payer: Self-pay

## 2018-10-12 ENCOUNTER — Encounter: Payer: BC Managed Care – PPO | Attending: Endocrinology | Admitting: Nutrition

## 2018-10-12 DIAGNOSIS — Z713 Dietary counseling and surveillance: Secondary | ICD-10-CM | POA: Insufficient documentation

## 2018-10-12 DIAGNOSIS — Z794 Long term (current) use of insulin: Secondary | ICD-10-CM | POA: Insufficient documentation

## 2018-10-12 DIAGNOSIS — E1165 Type 2 diabetes mellitus with hyperglycemia: Secondary | ICD-10-CM | POA: Insufficient documentation

## 2018-10-12 MED ORDER — GLUCOSE BLOOD VI STRP: ORAL_STRIP | 12 refills | Status: DC

## 2018-10-12 MED ORDER — INSULIN ASPART 100 UNIT/ML ~~LOC~~ SOLN: 45.0000 [IU] | Freq: Every day | SUBCUTANEOUS | 11 refills | Status: DC

## 2018-10-13 ENCOUNTER — Telehealth: Payer: Self-pay | Admitting: Nutrition

## 2018-10-13 ENCOUNTER — Encounter: Payer: Self-pay | Admitting: Endocrinology

## 2018-10-13 ENCOUNTER — Ambulatory Visit (INDEPENDENT_AMBULATORY_CARE_PROVIDER_SITE_OTHER): Payer: BC Managed Care – PPO | Admitting: Endocrinology

## 2018-10-13 VITALS — BP 110/78 | HR 87 | Ht 65.0 in | Wt 141.0 lb

## 2018-10-13 DIAGNOSIS — Z794 Long term (current) use of insulin: Secondary | ICD-10-CM | POA: Diagnosis not present

## 2018-10-13 DIAGNOSIS — E1165 Type 2 diabetes mellitus with hyperglycemia: Secondary | ICD-10-CM | POA: Diagnosis not present

## 2018-10-13 NOTE — Patient Instructions (Signed)
Test blood sugars before meals, 2hr. After meals, and at [facility].   Call 800 number if questions.

## 2018-10-13 NOTE — Progress Notes (Signed)
[name], female   DOB: [birth year], 42 y.o.   MRN: [id number]    Reason for Appointment: Diabetes follow-up   History of Present Illness   Diagnosis: date of diagnosis: 2009, initially asymptomatic, has type 2 diabetes.   RECENT history:     A1c is 7.1; previously 7.7 and gradually increasing, was down to 6.7 in in February  Current treatment regimen:   OMNIPOD insulin pump with basal rate: 0.35,  I/C 10, ISF: 60, Target: 110, with correction over 120.    Previous regimen: Tresiba 8 units daily, NovoLog usually 1: 10u carbohydrate coverage, Farxiga 5 mg daily  Blood sugar patterns, management and problems identified:  She has not had any difficulty starting the OmniPod insulin pump yesterday and had not taken [place] the night before  She was instructed by nurse educator and has been able to bolus as directed  With this yesterday her fasting blood sugar 164 but this morning it was 137  She did not check her sugar during the night as directed however  Before lunch blood sugar was 119 and before dinner 133 yesterday  AFTER breakfast blood sugar was not checked but by lunchtime blood sugar was 137 about 4 hours later  AFTER lunch her blood sugar was 155 but after 4 PM it went down to 58  After dinner her blood sugar was 110  Because of fear of hypoglycemia she had 50 g of carbs as a snack last night at about 10-11 PM without a bolus  Today in the office late afternoon her blood sugars in the 70s  Currently using NovoLog and has continued her Farxiga  Side effects from medications: Diarrhea from metformin and abdominal discomfort  Glucometer: Contour  Blood sugars as above   Meals: 3 meals per day, vegetarian, tries to get protein like nuts, [name] yogurt, soy, fish  Dinner usually at 7-8 pm.Cheese sandwiich, or vegetable soup with cheese, may have 1-2 starches per meal Sometimes will have  peanut butter sandwich for lunch  Physical  activity: exercise is minimal, mostly walking stairs, upto 6,000 steps per day  Dietician visit: Most recent: 03/2013  CDE visit: 10/15    Wt Readings from Last 3 Encounters:  10/13/18 141 lb (64 kg)  10/11/18 139 lb (63 kg)  09/14/18 142 lb (64.4 kg)    LABS:  Lab Results  Component Value Date   HGBA1C 7.1 (H) 10/05/2018   HGBA1C 7.7 (H) 07/20/2018   HGBA1C 7.5 (H) 04/15/2018   Lab Results  Component Value Date   MICROALBUR <0.7 07/20/2018   LDLCALC 94 07/20/2018   CREATININE 0.78 10/05/2018      No visits with results within 1 Week(s) from this visit.  Latest known visit with results is:  Lab on 10/05/2018  Component Date Value Ref Range Status  . Sodium 10/05/2018 139  135 - 145 mEq/L Final  . Potassium 10/05/2018 4.3  3.5 - 5.1 mEq/L Final  . Chloride 10/05/2018 104  96 - 112 mEq/L Final  . CO2 10/05/2018 28  19 - 32 mEq/L Final  . Glucose, Bld 10/05/2018 129* 70 - 99 mg/dL Final  . BUN 10/05/2018 12  6 - 23 mg/dL Final  . Creatinine, Ser 10/05/2018 0.78  0.40 - 1.20 mg/dL Final  . Calcium 10/05/2018 8.9  8.4 - 10.5 mg/dL Final  . GFR 10/05/2018 103.04  >60.00 mL/min Final  . Hgb A1c MFr Bld 10/05/2018 7.1* 4.6 - 6.5 % Final   Glycemic Control  Guidelines for People with Diabetes:Non Diabetic:  <6%Goal of Therapy: <7%Additional Action Suggested:  >8%     Allergies as of 10/13/2018      Reactions   Other Anaphylaxis   Peaches , tomatoes, strawberry   Strawberry Extract Hives   Facial hives and mouth hives   Tomato Hives   Facial hives and mouth hives      Medication List        Accurate as of 10/13/18  3:38 PM. Always use your most recent med list.          BD PEN NEEDLE NANO U/F 32G X 4 MM Misc Generic drug:  Insulin Pen Needle USE AS DIRECTED   blood glucose meter kit and supplies Kit Use as instructed to test blood sugar. DX: E11.09   DULoxetine 60 MG capsule Commonly known as:  CYMBALTA Take 60 mg by mouth daily.   FARXIGA 5 MG Tabs  tablet Generic drug:  dapagliflozin propanediol TAKE 1 TABLET BY MOUTH EVERY DAY **REPLACES JARDIANCE**   FREESTYLE LIBRE 14 DAY SENSOR Misc 1 UNITS BY DOES NOT APPLY ROUTE EVERY 14 (FOURTEEN) DAYS.   FUSION PLUS PO Take 1 capsule by mouth daily. Takes for anemia   glucose blood test strip Use as instructed use to test blood sugar before and after each meal   insulin aspart 100 UNIT/ML injection Commonly known as:  novoLOG Inject 45 Units into the skin daily.   multivitamin with minerals Tabs tablet Take 1 tablet by mouth daily.   Norethin Ace-Eth Estrad-FE 1-20 MG-MCG(24) Chew TAKE 1 TABLET BY MOUTH @ SAME TIME EACH DAY   TRESIBA FLEXTOUCH 100 UNIT/ML Sopn FlexTouch Pen Generic drug:  insulin degludec INJECT 10 UNITS INTO THE SKIN DAILY,ADJUST DOSE AS DIRECTED TO KEEP FASTING GLUCOSE BETWEEN 90-130       Allergies:  Allergies  Allergen Reactions  . Other Anaphylaxis    Peaches , tomatoes, strawberry  . Strawberry Extract Hives    Facial hives and mouth hives  . Tomato Hives    Facial hives and mouth hives    Past Medical History:  Diagnosis Date  . Back pain   . Diabetes mellitus   . Fibroids   . PCOS (polycystic ovarian syndrome)     Past Surgical History:  Procedure Laterality Date  . BREAST EXCISIONAL BIOPSY Right   . BREAST SURGERY     benign  . IR RADIOLOGIST EVAL & MGMT  09/14/2018  . UTERINE FIBROID SURGERY      Family History  Problem Relation Age of Onset  . Hyperlipidemia Other   . Hypertension Other   . Stroke Other   . Cancer Other   . Diabetes Maternal Uncle   . Heart disease Paternal Uncle   . Diabetes Maternal Grandmother   . Heart disease Maternal Grandmother   . Breast cancer Maternal Grandmother        diagnosed in her 45's  . Diabetes Paternal Grandmother     Social History:  reports that she has never smoked. She has never used smokeless tobacco. She reports that she does not drink alcohol or use drugs.  Review of Systems  -  Has high LDL at times, recently appears to have improved level   Lab Results  Component Value Date   CHOL 187 07/20/2018   HDL 84.80 07/20/2018   LDLCALC 94 07/20/2018   TRIG 39.0 07/20/2018   CHOLHDL 2 07/20/2018       Examination:   BP 110/78  Pulse 87   Ht 5' 5" (1.651 m)   Wt 141 lb (64 kg)   LMP 09/14/2018   SpO2 98%   BMI 23.46 kg/m   Body mass index is 23.46 kg/m.    Assesment:   DIABETES type 2:  See history of present illness for detailed discussion of  current management, blood sugar patterns and problems identified  With her insulin pump her blood sugars are reasonably well controlled Abnormal patterns are as follows Blood sugar late afternoon appears to be getting low with her current basal of 0.35 throughout the day Postprandial blood sugar after evening meal was relatively low at 110 although after lunch it was 155 and after breakfast which was 3 hours later was 137   Recommendations today: As a precaution will reduce her carbohydrate coverage to 1: 12 with lunch and dinner both Reduce basal rate between 12 noon and 6 PM down to 0.30 Changes were made in the office She will follow-up tomorrow for further adjustments  There are no Patient Instructions on file for this visit.     Ajay Kumar 10/13/2018, 3:38 PM   Note: This office note was prepared with Dragon voice recognition system technology. Any transcriptional errors that result from this process are unintentional.  

## 2018-10-13 NOTE — Patient Instructions (Signed)
Carb ratio 1: 12 at lunch and dinner

## 2018-10-13 NOTE — Telephone Encounter (Signed)
Patient reported no difficulty giving bolus before lunch.  Blood sugar dropped to 58 acS, but treated it appropriately.  She has eaten supper late, and is not time to test 2hr. PcS.  She was told that if her blood sugar drops low, or is below 120 at HS, to eat a 10 gram carbohydrate snack with no insulin.  She agreed to do this.

## 2018-10-13 NOTE — Progress Notes (Signed)
Patient was trained on how to use the Dash.  Meter was linked to pump and settings were put in by patient.   Basal rate: 0.35,  I/C 10, ISF: 60, Target: 110 (patient preferred this over 120, with correction over 120.   We reviewed how to give a bolus and she redemonstrated this correctly X2.   She filled a pod with Novolog insulin and attached it to her upper right arm,   She was encouraged to test her blood sugar ac, 2hr. Pc, HS and 3 AM.  She will see Dr. Dwyane Dee tomorrow to review.  We also reviewed how to change basal rates, and how to fill a new pod for Saturday.   We reviewed what supplies to carry with her at all times.   She has made and appointment with me Monday to review and sign off  Checklist.  She had no final questions. I will call her tonight to see how she did.

## 2018-10-14 ENCOUNTER — Encounter: Payer: Self-pay | Admitting: Endocrinology

## 2018-10-14 ENCOUNTER — Ambulatory Visit (INDEPENDENT_AMBULATORY_CARE_PROVIDER_SITE_OTHER): Payer: BC Managed Care – PPO | Admitting: Endocrinology

## 2018-10-14 VITALS — BP 112/74 | HR 86 | Ht 65.0 in | Wt 142.0 lb

## 2018-10-14 DIAGNOSIS — Z23 Encounter for immunization: Secondary | ICD-10-CM | POA: Diagnosis not present

## 2018-10-14 DIAGNOSIS — E1165 Type 2 diabetes mellitus with hyperglycemia: Secondary | ICD-10-CM | POA: Diagnosis not present

## 2018-10-14 DIAGNOSIS — Z794 Long term (current) use of insulin: Secondary | ICD-10-CM

## 2018-10-14 NOTE — Progress Notes (Signed)
[name], female   DOB: Aug 01, 1976, 42 y.o.   MRN: [id number]    Reason for Appointment: Insulin pump follow-up   History of Present Illness   Diagnosis: date of diagnosis: 2009, initially asymptomatic, has type 2 diabetes.   RECENT history:     A1c is 7.1; previously 7.7 and gradually increasing, was down to 6.7 in in February  Current treatment regimen:   OMNIPOD insulin pump with basal rate: 0.35 until 4 PM, 0.3 until 6 PM and then 0.35,  I/C 10, ISF: 60, Target: 110, with correction over 120.    Previous regimen: Tresiba 8 units daily, NovoLog usually 1: 10u carbohydrate coverage, Farxiga 5 mg daily  Blood sugar patterns, management and problems identified:  She had a low normal blood sugar late afternoon yesterday and her basal rate was reduced after 4 PM  Also because of low normal readings after lunch and dinner her carbohydrate ratio for these meals was changed to 1: 12  Last evening her glucose was 147 before supper and 129 postprandially  Although glucose at 10 PM was 124 she had a low sugar of 55 at 1:30 AM  Because she had a significant amount of carbohydrate to treat this her fasting glucose was 138 at breakfast time  Lunch glucose was 74, after a very small meal glucose was 100 and was 64 in the office late afternoon  Side effects from medications: Diarrhea from metformin and abdominal discomfort  Glucometer: Contour  Blood sugars as above   Meals: 3 meals per day, vegetarian, tries to get protein like nuts, [name] yogurt, soy, fish  Dinner usually at 7-8 pm.Cheese sandwiich, or vegetable soup with cheese, may have 1-2 starches per meal Sometimes will have  peanut butter sandwich for lunch  Physical activity: exercise is minimal, mostly walking stairs, upto 6,000 steps per day  Dietician visit: Most recent: 03/2013  CDE visit: 10/15    Wt Readings from Last 3 Encounters:  10/14/18 142 lb (64.4 kg)  10/13/18 141 lb (64  kg)  10/11/18 139 lb (63 kg)    LABS:  Lab Results  Component Value Date   HGBA1C 7.1 (H) 10/05/2018   HGBA1C 7.7 (H) 07/20/2018   HGBA1C 7.5 (H) 04/15/2018   Lab Results  Component Value Date   MICROALBUR <0.7 07/20/2018   LDLCALC 94 07/20/2018   CREATININE 0.78 10/05/2018      No visits with results within 1 Week(s) from this visit.  Latest known visit with results is:  Lab on 10/05/2018  Component Date Value Ref Range Status  . Sodium 10/05/2018 139  135 - 145 mEq/L Final  . Potassium 10/05/2018 4.3  3.5 - 5.1 mEq/L Final  . Chloride 10/05/2018 104  96 - 112 mEq/L Final  . CO2 10/05/2018 28  19 - 32 mEq/L Final  . Glucose, Bld 10/05/2018 129* 70 - 99 mg/dL Final  . BUN 10/05/2018 12  6 - 23 mg/dL Final  . Creatinine, Ser 10/05/2018 0.78  0.40 - 1.20 mg/dL Final  . Calcium 10/05/2018 8.9  8.4 - 10.5 mg/dL Final  . GFR 10/05/2018 103.04  >60.00 mL/min Final  . Hgb A1c MFr Bld 10/05/2018 7.1* 4.6 - 6.5 % Final   Glycemic Control Guidelines for People with Diabetes:Non Diabetic:  <6%Goal of Therapy: <7%Additional Action Suggested:  >8%     Allergies as of 10/14/2018      Reactions   Other Anaphylaxis   Peaches , tomatoes, strawberry   Strawberry  Extract Hives   Facial hives and mouth hives   Tomato Hives   Facial hives and mouth hives      Medication List        Accurate as of 10/14/18  4:36 PM. Always use your most recent med list.          BD PEN NEEDLE NANO U/F 32G X 4 MM Misc Generic drug:  Insulin Pen Needle USE AS DIRECTED   blood glucose meter kit and supplies Kit Use as instructed to test blood sugar. DX: E11.09   DULoxetine 60 MG capsule Commonly known as:  CYMBALTA Take 60 mg by mouth daily.   FARXIGA 5 MG Tabs tablet Generic drug:  dapagliflozin propanediol TAKE 1 TABLET BY MOUTH EVERY DAY **REPLACES JARDIANCE**   FREESTYLE LIBRE 14 DAY SENSOR Misc 1 UNITS BY DOES NOT APPLY ROUTE EVERY 14 (FOURTEEN) DAYS.   FUSION PLUS PO Take 1  capsule by mouth daily. Takes for anemia   glucose blood test strip Use as instructed use to test blood sugar before and after each meal   insulin aspart 100 UNIT/ML injection Commonly known as:  novoLOG Inject 45 Units into the skin daily.   multivitamin with minerals Tabs tablet Take 1 tablet by mouth daily.   Norethin Ace-Eth Estrad-FE 1-20 MG-MCG(24) Chew TAKE 1 TABLET BY MOUTH @ SAME TIME EACH DAY       Allergies:  Allergies  Allergen Reactions  . Other Anaphylaxis    Peaches , tomatoes, strawberry  . Strawberry Extract Hives    Facial hives and mouth hives  . Tomato Hives    Facial hives and mouth hives    Past Medical History:  Diagnosis Date  . Back pain   . Diabetes mellitus   . Fibroids   . PCOS (polycystic ovarian syndrome)     Past Surgical History:  Procedure Laterality Date  . BREAST EXCISIONAL BIOPSY Right   . BREAST SURGERY     benign  . IR RADIOLOGIST EVAL & MGMT  09/14/2018  . UTERINE FIBROID SURGERY      Family History  Problem Relation Age of Onset  . Hyperlipidemia Other   . Hypertension Other   . Stroke Other   . Cancer Other   . Diabetes Maternal Uncle   . Heart disease Paternal Uncle   . Diabetes Maternal Grandmother   . Heart disease Maternal Grandmother   . Breast cancer Maternal Grandmother        diagnosed in her 40's  . Diabetes Paternal Grandmother     Social History:  reports that she has never smoked. She has never used smokeless tobacco. She reports that she does not drink alcohol or use drugs.  Review of Systems -  Has high LDL at times, recently appears to have improved level   Lab Results  Component Value Date   CHOL 187 07/20/2018   HDL 84.80 07/20/2018   LDLCALC 94 07/20/2018   TRIG 39.0 07/20/2018   CHOLHDL 2 07/20/2018       Examination:   BP 112/74   Pulse 86   Ht _0  (1.651 m)   Wt 142 lb (64.4 kg)   LMP 09/14/2018   SpO2 96%   BMI 23.63 kg/m   Body mass index is 23.63 kg/m.     Assesment:   DIABETES type 2:  See history of present illness for detailed discussion of  current management, blood sugar patterns and problems identified  Blood sugars are overall still fairly  well controlled She does appear to have relatively low sugars right after midnight and late afternoon again However postprandial readings appear to be fairly good although did not check after breakfast today Her blood sugar average yesterday was 125 with 7 readings which is excellent She is feeling comfortable using the pump especially feature of mealtime bolusing and likes not having to do injections   Recommendations today: As a precaution will reduce her carbohydrate coverage to 1: 12 with lunch and dinner both Reduce basal rate between 12 midnight and 4 AM down to 0.30 Also reduce basal rate between 12 noon and 6 PM down to 0.25 and continue 0.3 6:05 PM Changes were made in the office She will call if she has any abnormal patterns on Monday otherwise follow-up in 1 month  Influenza vaccine given  There are no Patient Instructions on file for this visit.     Elayne Snare 10/14/2018, 4:36 PM   Note: This office note was prepared with Dragon voice recognition system technology. Any transcriptional errors that result from this process are unintentional.

## 2018-10-19 ENCOUNTER — Encounter: Payer: BC Managed Care – PPO | Admitting: Nutrition

## 2018-10-19 DIAGNOSIS — E1165 Type 2 diabetes mellitus with hyperglycemia: Secondary | ICD-10-CM

## 2018-10-25 NOTE — Patient Instructions (Signed)
Read over resource manual and Manual. Call 800 number if questions.

## 2018-10-25 NOTE — Progress Notes (Signed)
Patient reported no difficulty with giving boluses, sleeping with pump or using it.  We reviewed how/when to use the temp. Basal rate settings, extended boluses, and high blood sugar protocol.  She changed her pod with very little assistance from me.  She signed the checklist as understanding all topics and had no final questions.

## 2018-10-27 ENCOUNTER — Other Ambulatory Visit: Payer: Self-pay | Admitting: Obstetrics and Gynecology

## 2018-11-07 ENCOUNTER — Encounter: Payer: BC Managed Care – PPO | Attending: Endocrinology | Admitting: Dietician

## 2018-11-07 ENCOUNTER — Encounter: Payer: Self-pay | Admitting: Dietician

## 2018-11-07 ENCOUNTER — Other Ambulatory Visit (HOSPITAL_COMMUNITY): Payer: Self-pay | Admitting: Interventional Radiology

## 2018-11-07 ENCOUNTER — Other Ambulatory Visit: Payer: Self-pay | Admitting: Endocrinology

## 2018-11-07 DIAGNOSIS — E282 Polycystic ovarian syndrome: Secondary | ICD-10-CM

## 2018-11-07 DIAGNOSIS — Z713 Dietary counseling and surveillance: Secondary | ICD-10-CM | POA: Insufficient documentation

## 2018-11-07 DIAGNOSIS — Z794 Long term (current) use of insulin: Secondary | ICD-10-CM | POA: Insufficient documentation

## 2018-11-07 DIAGNOSIS — E1165 Type 2 diabetes mellitus with hyperglycemia: Secondary | ICD-10-CM | POA: Insufficient documentation

## 2018-11-07 NOTE — Patient Instructions (Addendum)
Plan and ideas: Mining engineer Recipes Do some simple meal planning each week.  Taco salad  Bean burritos  Salad bar  Soup and salad  Chili, corn bread, salad  Baked potato, chili, broccoli  Pasta, pesto, Meatless crumbles, salad  Stirfry, tofu, brown rice Avoid skipping meals Vegetarian protein sources  Tofu  "Vegetarian protein" items  Beans/lentils (legumes)  Nuts and seeds, nut butter  Vitamin B-12  Use the [employer]

## 2018-11-07 NOTE — Progress Notes (Signed)
Diabetes Self-Management Education  Visit Type:  Follow-up  Appt. Start Time: 0815 Appt. End Time: 0900  11/07/2018  [name], identified by name and date of birth, is a 42 y.o. female with a diagnosis of Diabetes:  Type 2 diabetes.  Other history includes fibroids and she is to have an ablation before the end of the year.   ASSESSMENT Patient is here today alone.  She is now on the Omnipod DASH insulin pump.  She is doing well with this.  She has started to become much more aware of the effect of fat on her blood sugar.  She is working towards following a vegan diet.  She continues to struggle with skipping meals.  She makes sure everything is prepared for her family but then does not have the time to care for herself.  She is going to the grocery store almost daily. Discussed meal planning and using convenience foods as needed to provide balanced meals.  Discussed use of the school cafeteria for breakfast and or lunch.  Discussed planning meals around food that she wants to eat for the family and adding foods they would like such as meat to go with these meals. Reviewed basic carbohydrate counting by portion size. She also was delaying eating if her blood sugar was high before a meal.  Discussed that this is not necessary and she should enter her blood sugar and carbohydrates into the pump then eat.  Also, since using the pump, she has been checking her blood sugar multiple times a day using the Contour meter that came with the pump.  Discussed that she could use the [employer] again.  Medication includes:  Novolog via pump, Farxiga Weight increased to 145 lbs Highest weight 149 lbs.   Lowest weight 110 lbs when she was on a vegan diet in the past. Weight 145 lb (65.8 kg). Body mass index is 24.13 kg/m.   Diabetes Self-Management Education - 11/07/18 0900      Psychosocial Assessment   Patient Concerns  Nutrition/Meal planning    Special Needs  None    Preferred Learning Style   No preference indicated    Learning Readiness  Ready      Pre-Education Assessment   Patient understands the diabetes disease and treatment process.  Demonstrates understanding / competency    Patient understands incorporating nutritional management into lifestyle.  Needs Review    Patient undertands incorporating physical activity into lifestyle.  Demonstrates understanding / competency    Patient understands using medications safely.  Demonstrates understanding / competency    Patient understands monitoring blood glucose, interpreting and using results  Needs Review    Patient understands prevention, detection, and treatment of acute complications.  Demonstrates understanding / competency    Patient understands prevention, detection, and treatment of chronic complications.  Demonstrates understanding / competency    Patient understands how to develop strategies to address psychosocial issues.  Demonstrates understanding / competency    Patient understands how to develop strategies to promote health/change behavior.  Needs Review      Complications   Last HgB A1C per patient/outside source  7.1 %   09/2018   How often do you check your blood sugar?  > 4 times/day      Dietary Intake   Breakfast  sometimes skips    Lunch  mushroom ravioli    Beverage(s)  water, unsweetened green tea      Exercise   Exercise Type  Light (walking / raking leaves)  Patient Education   Previous Diabetes Education  Yes (please comment)   3 months ago   Nutrition management   Carbohydrate counting;Meal timing in regards to the patients' current diabetes medication.;Meal options for control of blood glucose level and chronic complications.    Monitoring  Other (comment)   Discussed her readings.  recommended using Libre.   Psychosocial adjustment  Worked with patient to identify barriers to care and solutions      Individualized Goals (developed by patient)   Nutrition  General guidelines for healthy  choices and portions discussed;Other (comment)   carbohydrate counting   Physical Activity  Exercise 5-7 days per week;30 minutes per day    Medications  take my medication as prescribed    Monitoring   test my blood glucose as discussed    Reducing Risk  Other (comment)    Health Coping  discuss diabetes with (comment)      Patient Self-Evaluation of Goals - Patient rates self as meeting previously set goals (% of time)   Nutrition  >75%    Physical Activity  >75%    Medications  >75%    Monitoring  >75%    Problem Solving  >75%    Reducing Risk  >75%    Health Coping  >75%      Post-Education Assessment   Patient understands the diabetes disease and treatment process.  Demonstrates understanding / competency    Patient understands incorporating nutritional management into lifestyle.  Demonstrates understanding / competency    Patient undertands incorporating physical activity into lifestyle.  Demonstrates understanding / competency    Patient understands using medications safely.  Demonstrates understanding / competency    Patient understands monitoring blood glucose, interpreting and using results  Demonstrates understanding / competency    Patient understands prevention, detection, and treatment of acute complications.  Demonstrates understanding / competency    Patient understands prevention, detection, and treatment of chronic complications.  Demonstrates understanding / competency    Patient understands how to develop strategies to address psychosocial issues.  Demonstrates understanding / competency    Patient understands how to develop strategies to promote health/change behavior.  Needs Review      Outcomes   Program Status  Completed      Subsequent Visit   Since your last visit have you experienced any weight changes?  Gain    Weight Gain (lbs)  3    Since your last visit, are you checking your blood glucose at least once a day?  Yes      Patient lives with her husband  who works 2 jobs Set designer).  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 She also has 4 boys who are adopted ages 40, 69, 61, and 6. She does the shopping and cooking. She feels that she is having a hard time taking care of her diabetes and self care but has now started to leave work on time to work on this balance.  Learning Objective:  Patient will have a greater understanding of diabetes self-management. Patient education plan is to attend individual and/or group sessions per assessed needs and concerns.   Plan:   Patient Instructions  Game Changer Movie Recipes Do some simple meal planning each week.  Taco salad  Bean burritos  Salad bar  Soup and salad  Chili, corn bread, salad  Baked potato, chili, broccoli  Pasta, pesto, Meatless crumbles, salad  Stirfry, tofu, brown rice Avoid skipping  meals Vegetarian protein sources  Tofu  "Vegetarian protein" items  Beans/lentils (legumes)  Nuts and seeds, nut butter   Vitamin B-12   Use the Libre   Expected Outcomes:  Demonstrated interest in learning. Expect positive outcomes  Education material provided: Meal plan card  If problems or questions, patient to contact team via:  Phone  Future DSME appointment: - 2 months, 3-4 months

## 2018-11-10 ENCOUNTER — Ambulatory Visit: Payer: Self-pay | Admitting: Endocrinology

## 2018-11-15 ENCOUNTER — Other Ambulatory Visit: Payer: Self-pay | Admitting: Physician Assistant

## 2018-11-17 ENCOUNTER — Encounter (HOSPITAL_COMMUNITY): Payer: Self-pay

## 2018-11-17 ENCOUNTER — Ambulatory Visit (HOSPITAL_COMMUNITY)
Admission: RE | Admit: 2018-11-17 | Discharge: 2018-11-17 | Disposition: A | Discharge status: Home / Self Care | Payer: BC Managed Care – PPO | Source: Ambulatory Visit | Attending: Diagnostic Radiology | Admitting: Diagnostic Radiology

## 2018-11-17 ENCOUNTER — Observation Stay (HOSPITAL_COMMUNITY)
Admission: RE | Admit: 2018-11-17 | Discharge: 2018-11-19 | Disposition: A | Discharge status: Home / Self Care | Payer: BC Managed Care – PPO | Source: Ambulatory Visit | Attending: Interventional Radiology | Admitting: Interventional Radiology

## 2018-11-17 VITALS — BP 117/63 | HR 81 | Temp 98.2°F | Resp 14 | Ht 65.0 in | Wt 142.0 lb

## 2018-11-17 DIAGNOSIS — E282 Polycystic ovarian syndrome: Secondary | ICD-10-CM | POA: Insufficient documentation

## 2018-11-17 DIAGNOSIS — Z833 Family history of diabetes mellitus: Secondary | ICD-10-CM | POA: Insufficient documentation

## 2018-11-17 DIAGNOSIS — E119 Type 2 diabetes mellitus without complications: Secondary | ICD-10-CM | POA: Diagnosis not present

## 2018-11-17 DIAGNOSIS — Z79899 Other long term (current) drug therapy: Secondary | ICD-10-CM | POA: Insufficient documentation

## 2018-11-17 DIAGNOSIS — Z794 Long term (current) use of insulin: Secondary | ICD-10-CM | POA: Diagnosis not present

## 2018-11-17 DIAGNOSIS — D219 Benign neoplasm of connective and other soft tissue, unspecified: Secondary | ICD-10-CM | POA: Diagnosis present

## 2018-11-17 DIAGNOSIS — D259 Leiomyoma of uterus, unspecified: Secondary | ICD-10-CM | POA: Diagnosis not present

## 2018-11-17 HISTORY — PX: IR US GUIDE VASC ACCESS RIGHT: IMG2390

## 2018-11-17 HISTORY — PX: IR ANGIOGRAM PELVIS SELECTIVE OR SUPRASELECTIVE: IMG661

## 2018-11-17 HISTORY — PX: IR ANGIOGRAM SELECTIVE EACH ADDITIONAL VESSEL: IMG667

## 2018-11-17 HISTORY — PX: IR EMBO TUMOR ORGAN ISCHEMIA INFARCT INC GUIDE ROADMAPPING: IMG5449

## 2018-11-17 LAB — CBC WITH DIFFERENTIAL/PLATELET
Abs Immature Granulocytes: 0.01 10*3/uL (ref 0.00–0.07)
Basophils Absolute: 0 10*3/uL (ref 0.0–0.1)
Basophils Relative: 1 %
Eosinophils Absolute: 0.1 10*3/uL (ref 0.0–0.5)
Eosinophils Relative: 1 %
HCT: 41.4 % (ref 36.0–46.0)
Hemoglobin: 12.9 g/dL (ref 12.0–15.0)
Immature Granulocytes: 0 %
LYMPHS ABS: 1.4 10*3/uL (ref 0.7–4.0)
Lymphocytes Relative: 20 %
MCH: 26.5 pg (ref 26.0–34.0)
MCHC: 31.2 g/dL (ref 30.0–36.0)
MCV: 85.2 fL (ref 80.0–100.0)
Monocytes Absolute: 0.6 10*3/uL (ref 0.1–1.0)
Monocytes Relative: 9 %
Neutro Abs: 4.7 10*3/uL (ref 1.7–7.7)
Neutrophils Relative %: 69 %
Platelets: 319 10*3/uL (ref 150–400)
RBC: 4.86 MIL/uL (ref 3.87–5.11)
RDW: 14.1 % (ref 11.5–15.5)
WBC: 6.8 10*3/uL (ref 4.0–10.5)
nRBC: 0 % (ref 0.0–0.2)

## 2018-11-17 LAB — BASIC METABOLIC PANEL
Anion gap: 8 (ref 5–15)
BUN: 13 mg/dL (ref 6–20)
CO2: 21 mmol/L — AB (ref 22–32)
Calcium: 9.3 mg/dL (ref 8.9–10.3)
Chloride: 110 mmol/L (ref 98–111)
Creatinine, Ser: 0.79 mg/dL (ref 0.44–1.00)
GFR calc Af Amer: >60 mL/min (ref >60)
GFR calc non Af Amer: >60 mL/min (ref >60)
Glucose, Bld: 146 mg/dL — ABNORMAL HIGH (ref 70–99)
Potassium: 3.9 mmol/L (ref 3.5–5.1)
Sodium: 139 mmol/L (ref 135–145)

## 2018-11-17 LAB — PROTIME-INR
INR: 0.96
Prothrombin Time: 12.7 seconds (ref 11.4–15.2)

## 2018-11-17 LAB — GLUCOSE, CAPILLARY
GLUCOSE-CAPILLARY: 144 mg/dL — AB (ref 70–99)
Glucose-Capillary: 126 mg/dL — ABNORMAL HIGH (ref 70–99)
Glucose-Capillary: 158 mg/dL — ABNORMAL HIGH (ref 70–99)
Glucose-Capillary: 151 mg/dL — ABNORMAL HIGH (ref 70–99)

## 2018-11-17 LAB — HCG, SERUM, QUALITATIVE: Preg, Serum: NEGATIVE

## 2018-11-17 MED ORDER — NALOXONE HCL 0.4 MG/ML IJ SOLN: 0.4000 mg | INTRAMUSCULAR | Status: DC | PRN

## 2018-11-17 MED ORDER — FENTANYL CITRATE (PF) 100 MCG/2ML IJ SOLN
INTRAMUSCULAR | Status: AC | PRN
  Administered 2018-11-17 (×4): 50 ug via INTRAVENOUS

## 2018-11-17 MED ORDER — MIDAZOLAM HCL 2 MG/2ML IJ SOLN
INTRAMUSCULAR | Status: AC | PRN
  Administered 2018-11-17 (×4): 1 mg via INTRAVENOUS

## 2018-11-17 MED ORDER — CEFAZOLIN SODIUM-DEXTROSE 2-4 GM/100ML-% IV SOLN
INTRAVENOUS | Status: AC
  Administered 2018-11-17: 2 g via INTRAVENOUS
  Filled 2018-11-17: qty 100

## 2018-11-17 MED ORDER — DIPHENHYDRAMINE HCL 12.5 MG/5ML PO ELIX: 12.5000 mg | ORAL_SOLUTION | Freq: Four times a day (QID) | ORAL | Status: DC | PRN

## 2018-11-17 MED ORDER — SODIUM CHLORIDE 0.9% FLUSH: 9.0000 mL | INTRAVENOUS | Status: DC | PRN

## 2018-11-17 MED ORDER — HYDROMORPHONE 1 MG/ML IV SOLN
INTRAVENOUS | Status: DC
  Administered 2018-11-17: 30 mg via INTRAVENOUS
  Filled 2018-11-17: qty 25

## 2018-11-17 MED ORDER — KETOROLAC TROMETHAMINE 30 MG/ML IJ SOLN
30.0000 mg | Freq: Once | INTRAMUSCULAR | Status: AC
  Administered 2018-11-17: 30 mg via INTRAVENOUS
  Filled 2018-11-17: qty 1

## 2018-11-17 MED ORDER — DOCUSATE SODIUM 100 MG PO CAPS
100.0000 mg | ORAL_CAPSULE | Freq: Two times a day (BID) | ORAL | Status: DC
  Administered 2018-11-17 – 2018-11-19 (×5): 100 mg via ORAL
  Filled 2018-11-17 (×5): qty 1

## 2018-11-17 MED ORDER — ONDANSETRON HCL 4 MG/2ML IJ SOLN: 4.0000 mg | Freq: Four times a day (QID) | INTRAMUSCULAR | Status: DC | PRN

## 2018-11-17 MED ORDER — HYDROMORPHONE 1 MG/ML IV SOLN: INTRAVENOUS | Status: DC

## 2018-11-17 MED ORDER — INSULIN PUMP
Freq: Three times a day (TID) | SUBCUTANEOUS | Status: DC
  Administered 2018-11-17 – 2018-11-18 (×3): via SUBCUTANEOUS
  Administered 2018-11-19: 2.85 via SUBCUTANEOUS
  Filled 2018-11-17: qty 1

## 2018-11-17 MED ORDER — SODIUM CHLORIDE 0.9 % IV SOLN
INTRAVENOUS | Status: DC
  Administered 2018-11-17: 08:00:00 via INTRAVENOUS

## 2018-11-17 MED ORDER — CANAGLIFLOZIN 100 MG PO TABS
100.0000 mg | ORAL_TABLET | Freq: Every day | ORAL | Status: DC
  Administered 2018-11-18 – 2018-11-19 (×2): 100 mg via ORAL
  Filled 2018-11-17 (×2): qty 1

## 2018-11-17 MED ORDER — IOHEXOL 300 MG/ML  SOLN
100.0000 mL | Freq: Once | INTRAMUSCULAR | Status: AC | PRN
  Administered 2018-11-17: 50 mL via INTRA_ARTERIAL

## 2018-11-17 MED ORDER — DIPHENHYDRAMINE HCL 50 MG/ML IJ SOLN: 12.5000 mg | Freq: Four times a day (QID) | INTRAMUSCULAR | Status: DC | PRN

## 2018-11-17 MED ORDER — MIDAZOLAM HCL 2 MG/2ML IJ SOLN
INTRAMUSCULAR | Status: AC
  Filled 2018-11-17: qty 6

## 2018-11-17 MED ORDER — LIDOCAINE HCL 1 % IJ SOLN
INTRAMUSCULAR | Status: AC
  Filled 2018-11-17: qty 20

## 2018-11-17 MED ORDER — LIDOCAINE HCL (PF) 1 % IJ SOLN
INTRAMUSCULAR | Status: AC | PRN
  Administered 2018-11-17: 5 mL

## 2018-11-17 MED ORDER — SODIUM CHLORIDE 0.9 % IV SOLN: 250.0000 mL | INTRAVENOUS | Status: DC | PRN

## 2018-11-17 MED ORDER — SODIUM CHLORIDE 0.9% FLUSH: 3.0000 mL | INTRAVENOUS | Status: DC | PRN

## 2018-11-17 MED ORDER — DIPHENHYDRAMINE HCL 12.5 MG/5ML PO ELIX
12.5000 mg | ORAL_SOLUTION | Freq: Four times a day (QID) | ORAL | Status: DC | PRN
  Filled 2018-11-17: qty 5

## 2018-11-17 MED ORDER — CEFAZOLIN SODIUM-DEXTROSE 2-4 GM/100ML-% IV SOLN
2.0000 g | Freq: Once | INTRAVENOUS | Status: AC
  Administered 2018-11-17: 2 g via INTRAVENOUS

## 2018-11-17 MED ORDER — DULOXETINE HCL 60 MG PO CPEP
60.0000 mg | ORAL_CAPSULE | Freq: Every day | ORAL | Status: DC
  Administered 2018-11-17 – 2018-11-18 (×2): 60 mg via ORAL
  Filled 2018-11-17 (×3): qty 1

## 2018-11-17 MED ORDER — IOHEXOL 300 MG/ML  SOLN
100.0000 mL | Freq: Once | INTRAMUSCULAR | Status: AC | PRN
  Administered 2018-11-17: 35 mL via INTRA_ARTERIAL

## 2018-11-17 MED ORDER — INSULIN ASPART 100 UNIT/ML ~~LOC~~ SOLN: 45.0000 [IU] | Freq: Every day | SUBCUTANEOUS | Status: DC

## 2018-11-17 MED ORDER — SODIUM CHLORIDE 0.9% FLUSH
3.0000 mL | Freq: Two times a day (BID) | INTRAVENOUS | Status: DC
  Administered 2018-11-18 – 2018-11-19 (×2): 3 mL via INTRAVENOUS

## 2018-11-17 MED ORDER — INSULIN PEN NEEDLE 32G X 4 MM MISC: Status: DC

## 2018-11-17 MED ORDER — FENTANYL CITRATE (PF) 100 MCG/2ML IJ SOLN
INTRAMUSCULAR | Status: AC
  Filled 2018-11-17: qty 4

## 2018-11-17 MED ORDER — GLUCOSE BLOOD VI STRP: 1.0000 | ORAL_STRIP | Status: DC | PRN

## 2018-11-17 NOTE — H&P (Signed)
Chief Complaint: Patient was seen in consultation today for uterine fibroids.  Referring Physician(s): Christophe Louis  Supervising Physician: Sandi Mariscal  Patient Status: [facility] - Out-pt  History of Present Illness: Mia Wilson is a 42 y.o. female with a past medical history of diabetes mellitus, uterine fibroids, and PCOS. She was referred to our department by Dr. Landry Mellow for management of symptomatic uterine fibroids. She consulted with Dr. Pascal Lux 09/14/2018 to discuss management options. At that time, patient decided to pursue endovascular embolization of her uterine fibroids.  MR pelvis 09/19/2018: 1. Diffuse uterine involvement by numerous fibroids measuring up to 4.6 cm. No intracavitary or pedunculated fibroids identified.  Patient presents today for possible image-guided mesenteric angiogram with possible uterine artery embolization. Patient awake and alert laying in bed. Complains of lower abdominal cramping. States this is normal for her. Denies fever, chills, chest pain, dyspnea, dizziness, or headache.   Past Medical History:  Diagnosis Date  . Back pain   . Diabetes mellitus   . Fibroids   . PCOS (polycystic ovarian syndrome)     Past Surgical History:  Procedure Laterality Date  . BREAST EXCISIONAL BIOPSY Right   . BREAST SURGERY     benign  . IR RADIOLOGIST EVAL & MGMT  09/14/2018  . UTERINE FIBROID SURGERY      Allergies: Other; Strawberry extract; and Tomato  Medications: Prior to Admission medications   Medication Sig Start Date End Date Taking? Authorizing Provider  BD PEN NEEDLE NANO U/F 32G X 4 MM MISC USE AS DIRECTED 06/07/18  Yes Elayne Snare, MD  blood glucose meter kit and supplies KIT Use as instructed to test blood sugar. 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 03/03/18  Yes Elayne Snare, MD  DULoxetine (CYMBALTA) 60 MG  capsule Take 60 mg by mouth daily.   Yes [provider]  FARXIGA 5 MG TABS tablet TAKE 1 TABLET BY MOUTH EVERY DAY **REPLACES JARDIANCE** 11/07/18  Yes Elayne Snare, MD  glucose blood (CONTOUR NEXT TEST) test strip Use as instructed use to test blood sugar before and after each meal 10/12/18  Yes Elayne Snare, MD  insulin aspart (NOVOLOG) 100 UNIT/ML injection Inject 45 Units into the skin daily. 10/12/18  Yes Elayne Snare, MD  Iron-FA-B Cmp-C-Biot-Probiotic (FUSION PLUS PO) Take 1 capsule by mouth daily. Takes for anemia   Yes [provider]  Norethin Ace-Eth Estrad-FE 1-20 MG-MCG(24) CHEW TAKE 1 TABLET BY MOUTH @ SAME TIME EACH DAY 09/03/18  Yes [provider]  Multiple Vitamin (MULTIVITAMIN WITH MINERALS) TABS Take 1 tablet by mouth daily.    [provider]     Family History  Problem Relation Age of Onset  . Hyperlipidemia Other   . Hypertension Other   . Stroke Other   . Cancer Other   . Diabetes Maternal Uncle   . Heart disease Paternal Uncle   . Diabetes Maternal Grandmother   . Heart disease Maternal Grandmother   . Breast cancer Maternal Grandmother        diagnosed in her 54's  . Diabetes Paternal Grandmother     Social History   Socioeconomic History  . Marital status: Married    Spouse name: Not on file  . Number of children: Not on file  . Years of education: Not on file  . Highest education level: Not on file  Occupational  History  . Not on file  Social Needs  . Financial resource strain: Not on file  . Food insecurity:    Worry: Not on file    Inability: Not on file  . Transportation needs:    Medical: Not on file    Non-medical: Not on file  Tobacco Use  . Smoking status: Never Smoker  . Smokeless tobacco: Never Used  Substance and Sexual Activity  . Alcohol use: No  . Drug use: No  . Sexual activity: Yes  Lifestyle  . Physical activity:    Days per week: Not on file    Minutes per session: Not on file  .  Stress: Not on file  Relationships  . Social connections:    Talks on phone: Not on file    Gets together: Not on file    Attends religious service: Not on file    Active member of club or organization: Not on file    Attends meetings of clubs or organizations: Not on file    Relationship status: Not on file  Other Topics Concern  . Not on file  Social History Narrative   Patient moved here from [place] August of 2012. She is not employed at this time. She has been her 2 youngest adopted children at home. Not involved a lot else here at this time. She has 2 other children also. She walks a little bit but does not do a lot since she has left her with fatigue.     Review of Systems: A 12 point ROS discussed and pertinent positives are indicated in the HPI above.  All other systems are negative.  Review of Systems  Constitutional: Negative for chills and fever.  Respiratory: Negative for shortness of breath and wheezing.   Cardiovascular: Negative for chest pain and palpitations.  Gastrointestinal: Positive for abdominal pain.  Neurological: Negative for dizziness and headaches.  Psychiatric/Behavioral: Negative for behavioral problems and confusion.    Vital Signs: BP 124/70 (BP Location: Left Arm)   Pulse 80   Temp 98.5 F (36.9 C) (Oral)   Resp 18   Ht '5\' 5"'$  (1.651 m)   Wt 142 lb (64.4 kg)   SpO2 100%   BMI 23.63 kg/m   Physical Exam Constitutional:      General: She is not in acute distress.    Appearance: Normal appearance.  Cardiovascular:     Rate and Rhythm: Normal rate and regular rhythm.     Heart sounds: Normal heart sounds. No murmur.  Pulmonary:     Effort: Pulmonary effort is normal. No respiratory distress.     Breath sounds: Normal breath sounds. No wheezing.  Abdominal:     Palpations: Abdomen is soft.     Tenderness: There is no abdominal tenderness.  Skin:    General: Skin is warm and dry.  Neurological:     Mental Status: She is alert and  oriented to person, place, and time.  Psychiatric:        Mood and Affect: Mood normal.        Behavior: Behavior normal.        Thought Content: Thought content normal.        Judgment: Judgment normal.      MD Evaluation Airway: WNL Heart: WNL Abdomen: WNL Chest/ Lungs: WNL ASA  Classification: 2 Mallampati/Airway Score: One   Imaging: No results found.  Labs:  CBC: Recent Labs    11/17/18 0758  WBC 6.8  HGB 12.9  HCT  41.4  PLT 319    COAGS: No results for input(s): INR, APTT in the last 8760 hours.  BMP: Recent Labs    04/15/18 1350 07/20/18 0827 09/19/18 0927 10/05/18 0902 11/17/18 0758  NA 140 137  --  139 139  K 3.5 4.3  --  4.3 3.9  CL 106 103  --  104 110  CO2 26 27  --  28 21*  GLUCOSE 74 102*  --  129* 146*  BUN 13 11  --  12 13  CALCIUM 9.1 9.2  --  8.9 9.3  CREATININE 0.70 0.78 0.70 0.78 0.79  GFRNONAA  --   --   --   --  >60  GFRAA  --   --   --   --  >60    LIVER FUNCTION TESTS: Recent Labs    04/15/18 1350 07/20/18 0827  BILITOT 1.0 1.2  AST 16 17  ALT 13 15  ALKPHOS 67 64  PROT 6.8 6.9  ALBUMIN 3.9 4.0    TUMOR MARKERS: No results for input(s): AFPTM, CEA, CA199, CHROMGRNA in the last 8760 hours.  Assessment and Plan:  Uterine fibroids. Plan for image-guided mesenteric angiogram with possible uterine artery embolization today with Dr. Pascal Lux. Patient is NPO. Afebrile and WBCs WNL. She does not take blood thinners. INR 0.96 seconds this AM. Plan to transfer to floor following procedure for overnight observation.  The risks and benefits of embolization were discussed with the patient including, but not limited to bleeding, infection, vascular injury, post operative pain, or contrast induced renal failure. This procedure involves the use of X-rays and because of the nature of the planned procedure, it is possible that we will have prolonged use of X-ray fluoroscopy. Potential radiation risks to you include (but are not  limited to) the following: - A slightly elevated risk for cancer several years later in life. This risk is typically less than 0.5% percent. This risk is low in comparison to the normal incidence of human cancer, which is 33% for women and 50% for men according to the [place]. - Radiation induced injury can include skin redness, resembling a rash, tissue breakdown / ulcers and hair loss (which can be temporary or permanent).  The likelihood of either of these occurring depends on the difficulty of the procedure and whether you are sensitive to radiation due to previous procedures, disease, or genetic conditions.  IF your procedure requires a prolonged use of radiation, you will be notified and given written instructions for further action.  It is your responsibility to monitor the irradiated area for the 2 weeks following the procedure and to notify your physician if you are concerned that you have suffered a radiation induced injury.   All of the patient's questions were answered, patient is agreeable to proceed. Consent signed and in chart.   Thank you for this interesting consult.  I greatly enjoyed meeting Iveliz Massimino and look forward to participating in their care.  A copy of this report was sent to the requesting provider on this date.  Electronically Signed: Earley Abide, PA-C 11/17/2018, 8:20 AM   I spent a total of 40 Minutes in face to face in clinical consultation, greater than 50% of which was counseling/coordinating care for uterine fibroids.

## 2018-11-17 NOTE — Procedures (Signed)
Pre-procedure Diagnosis: Symptomatic uterine fibroids Post-procedure Diagnosis: Same  Post bilateral uterine artery embolization.    Complications: None Immediate  EBL: None  Keep right leg straight for 4 hrs (until 1500).    SignedSandi Mariscal Pager: [phone] 11/17/2018, 11:36 AM

## 2018-11-18 ENCOUNTER — Other Ambulatory Visit: Payer: Self-pay

## 2018-11-18 DIAGNOSIS — D259 Leiomyoma of uterus, unspecified: Secondary | ICD-10-CM | POA: Diagnosis not present

## 2018-11-18 LAB — GLUCOSE, CAPILLARY
Glucose-Capillary: 156 mg/dL — ABNORMAL HIGH (ref 70–99)
Glucose-Capillary: 149 mg/dL — ABNORMAL HIGH (ref 70–99)

## 2018-11-18 MED ORDER — SODIUM CHLORIDE 0.9% FLUSH: 9.0000 mL | INTRAVENOUS | Status: DC | PRN

## 2018-11-18 MED ORDER — NALOXONE HCL 0.4 MG/ML IJ SOLN: 0.4000 mg | INTRAMUSCULAR | Status: DC | PRN

## 2018-11-18 MED ORDER — ONDANSETRON HCL 4 MG/2ML IJ SOLN: 4.0000 mg | Freq: Four times a day (QID) | INTRAMUSCULAR | Status: DC | PRN

## 2018-11-18 MED ORDER — DIPHENHYDRAMINE HCL 50 MG/ML IJ SOLN: 12.5000 mg | Freq: Four times a day (QID) | INTRAMUSCULAR | Status: DC | PRN

## 2018-11-18 MED ORDER — KETOROLAC TROMETHAMINE 30 MG/ML IJ SOLN
30.0000 mg | Freq: Once | INTRAMUSCULAR | Status: AC
  Administered 2018-11-18: 30 mg via INTRAVENOUS
  Filled 2018-11-18: qty 1

## 2018-11-18 MED ORDER — HYDROMORPHONE 1 MG/ML IV SOLN
INTRAVENOUS | Status: DC
  Administered 2018-11-18: 3.6 mg via INTRAVENOUS
  Administered 2018-11-18: 2.7 mg via INTRAVENOUS

## 2018-11-18 MED ORDER — HYDROCODONE-ACETAMINOPHEN 5-325 MG PO TABS
1.0000 | ORAL_TABLET | ORAL | Status: DC | PRN
  Administered 2018-11-18: 2 via ORAL
  Filled 2018-11-18: qty 2

## 2018-11-18 MED ORDER — DIPHENHYDRAMINE HCL 12.5 MG/5ML PO ELIX: 12.5000 mg | ORAL_SOLUTION | Freq: Four times a day (QID) | ORAL | Status: DC | PRN

## 2018-11-18 MED ORDER — HYDROMORPHONE 1 MG/ML IV SOLN
INTRAVENOUS | Status: DC
  Administered 2018-11-18: 3.2 mg via INTRAVENOUS
  Administered 2018-11-18: 30 mg via INTRAVENOUS
  Administered 2018-11-19: 2.1 mg via INTRAVENOUS
  Administered 2018-11-19: 2.7 mg via INTRAVENOUS
  Administered 2018-11-19: 1.5 mg via INTRAVENOUS
  Filled 2018-11-18: qty 30

## 2018-11-18 MED ORDER — ONDANSETRON HCL 4 MG/2ML IJ SOLN
4.0000 mg | Freq: Once | INTRAMUSCULAR | Status: AC
  Administered 2018-11-18: 4 mg via INTRAVENOUS
  Filled 2018-11-18: qty 2

## 2018-11-18 NOTE — Progress Notes (Signed)
Referring Physician(s): Cole,T  Supervising Physician: Marybelle Killings  Patient Status:  [facility] - In-pt  Chief Complaint:  Symptomatic uterine fibroids  Subjective: Patient still complaining of moderate to severe pelvic cramping despite Norco and IV Toradol; has also had some intermittent nausea.  She has ambulated, voided and tolerated her diet; however, pelvic pain is worse after ambulating.    Allergies: Other; Strawberry extract; and Tomato  Medications: Prior to Admission medications   Medication Sig Start Date End Date Taking? Authorizing Provider  BD PEN NEEDLE NANO U/F 32G X 4 MM MISC USE AS DIRECTED 06/07/18  Yes Elayne Snare, MD  blood glucose meter kit and supplies KIT Use as instructed to test blood sugar. 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 03/03/18  Yes Elayne Snare, MD  DULoxetine (CYMBALTA) 60 MG capsule Take 60 mg by mouth daily.   Yes [provider]  FARXIGA 5 MG TABS tablet TAKE 1 TABLET BY MOUTH EVERY DAY **REPLACES JARDIANCE** Patient taking differently: Take 5 mg by mouth daily.  11/07/18  Yes Elayne Snare, MD  glucose blood (CONTOUR NEXT TEST) test strip Use as instructed use to test blood sugar before and after each meal 10/12/18  Yes Elayne Snare, MD  Insulin Human (INSULIN PUMP) SOLN Inject 1 each into the skin See admin instructions. Novolog 100 units/ml per insulin pump   Yes [provider]  Iron-FA-B Cmp-C-Biot-Probiotic (FUSION PLUS PO) Take 1 capsule by mouth daily. Takes for anemia   Yes [provider]  Norethin Ace-Eth Estrad-FE 1-20 MG-MCG(24) CHEW Chew 1 tablet by mouth daily.  09/03/18  Yes [provider]  insulin aspart (NOVOLOG) 100 UNIT/ML injection Inject 45 Units into the skin daily. Patient not taking: Reported on 11/17/2018 10/12/18   Elayne Snare, MD  Multiple Vitamin  (MULTIVITAMIN WITH MINERALS) TABS Take 1 tablet by mouth daily.    [provider]     Vital Signs: BP 133/73 (BP Location: Right Arm)   Pulse 73   Temp 98.2 F (36.8 C) (Oral)   Resp 18   Ht _0  (1.651 m)   Wt 142 lb (64.4 kg)   SpO2 100%   BMI 23.63 kg/m   Physical Exam awake, alert.  Chest clear to auscultation bilaterally.  Heart with regular rate and rhythm.  Abdomen soft, positive bowel sounds, fibroid uterus, mildly tender pelvic region with some radiation to right flank; right groin puncture site clean, dry, nontender, no hematoma.  Intact distal pulses.  Imaging: Ir Angiogram Pelvis Selective Or Supraselective  Result Date: 11/17/2018 INDICATION: Symptomatic uterine fibroids. Patient presents today for bilateral uterine artery embolization. Please refer to formal consultation in the epic EMR dated 09/14/2018 for additional details. EXAM: Uterine Fibroid Embolization MEDICATIONS: Toradol 30 mg IV; Ancef 2 gm IV. The antibiotic was administered within one hour of the procedure ANESTHESIA/SEDATION: Moderate (conscious) sedation was employed during this procedure. A total of Versed 4 mg and Fentanyl 200 mcg was administered intravenously. Moderate Sedation Time: 60 minutes. The patient's level of consciousness and vital signs were monitored continuously by radiology nursing throughout the procedure under my direct supervision. CONTRAST:  85 cc Omnipaque 300 FLUOROSCOPY TIME:  17 minutes, 54 seconds (160.7 mGy) COMPLICATIONS: None immediate. PROCEDURE: Informed consent was obtained from the patient following explanation of the procedure, risks, benefits and alternatives. The patient understands, agrees and consents  for the procedure. All questions were addressed. A time out was performed prior to the initiation of the procedure. Maximal barrier sterile technique utilized including caps, mask, sterile gowns, sterile gloves, large sterile drape, hand hygiene, and Betadine prep. The  right femoral head was marked fluoroscopically. Under sterile conditions and local anesthesia, the right common femoral artery access was performed with a micropuncture needle. Under direct ultrasound guidance, the right common femoral was accessed with a micropuncture kit. An ultrasound image was saved for documentation purposes. This allowed for placement of a 5-French vascular sheath. A limited arteriogram was performed through the side arm of the sheath confirming appropriate access within the right common femoral artery. The 5-French C2 catheter was utilized to select the contralateral left internal iliac artery. Selective left internal iliac angiogram was performed. The tortuous left uterine artery was identified. Selective catheterization was performed of the left uterine artery with a microcatheter and micro guide wire. A selective left uterine angiogram was performed. This demonstrated patency of the left uterine artery. Mild diffuse hypervascularity of the enlarged fibroid uterus. Access was adequate for embolization. For embolization, 3 vials of 500 - 700 micron Embospheres were injected into the left uterine artery. Post embolization angiogram confirms complete stasis of the left uterine vascular territory. Microcatheter was removed. The C2 catheter was retracted and utilized to select the right internal iliac artery. Selective right internal iliac angiogram was performed. The patent right uterine artery was identified. For selective catheterization, the micro catheter and guidewire were utilized to select the right uterine artery. Selective right uterine angiogram was performed. This demonstrated patency of the right uterine artery. Catheter position was safe for embolization. Embolization was performed to complete stasis with injection of 1 and 1/3 vials of 500-700 micron Embospheres. Post embolization angiogram confirms complete stasis of the right uterine vascular territory. At this point, all wires,  catheters and sheaths were removed from the patient. Hemostasis was achieved at the right groin access site with The patient tolerated the procedure well without immediate post procedural complication. FINDINGS: Bilateral internal iliac arteriograms demonstrates conventional branching pattern with widely patent origins of the bilateral uterine arteries. Sub selective bilateral uterine arteriograms demonstrate co-dominant supply to a hypertrophied myomatous uterus. Completion arteriograms following bilateral uterine artery particle embolization demonstrates a technically excellent result with stasis of flow within the bilateral uterine vascular territories. IMPRESSION: Successful bilateral uterine artery embolization (U F E). PLAN: The patient will be seen in follow-up consultation at the interventional radiology clinic in approximately 3-4 weeks. Electronically Signed   By: Sandi Mariscal M.D.   On: 11/17/2018 13:26   Ir Angiogram Pelvis Selective Or Supraselective  Result Date: 11/17/2018 INDICATION: Symptomatic uterine fibroids. Patient presents today for bilateral uterine artery embolization. Please refer to formal consultation in the epic EMR dated 09/14/2018 for additional details. EXAM: Uterine Fibroid Embolization MEDICATIONS: Toradol 30 mg IV; Ancef 2 gm IV. The antibiotic was administered within one hour of the procedure ANESTHESIA/SEDATION: Moderate (conscious) sedation was employed during this procedure. A total of Versed 4 mg and Fentanyl 200 mcg was administered intravenously. Moderate Sedation Time: 60 minutes. The patient's level of consciousness and vital signs were monitored continuously by radiology nursing throughout the procedure under my direct supervision. CONTRAST:  85 cc Omnipaque 300 FLUOROSCOPY TIME:  17 minutes, 54 seconds (591.6 mGy) COMPLICATIONS: None immediate. PROCEDURE: Informed consent was obtained from the patient following explanation of the procedure, risks, benefits and  alternatives. The patient understands, agrees and consents for the procedure. All questions  were addressed. A time out was performed prior to the initiation of the procedure. Maximal barrier sterile technique utilized including caps, mask, sterile gowns, sterile gloves, large sterile drape, hand hygiene, and Betadine prep. The right femoral head was marked fluoroscopically. Under sterile conditions and local anesthesia, the right common femoral artery access was performed with a micropuncture needle. Under direct ultrasound guidance, the right common femoral was accessed with a micropuncture kit. An ultrasound image was saved for documentation purposes. This allowed for placement of a 5-French vascular sheath. A limited arteriogram was performed through the side arm of the sheath confirming appropriate access within the right common femoral artery. The 5-French C2 catheter was utilized to select the contralateral left internal iliac artery. Selective left internal iliac angiogram was performed. The tortuous left uterine artery was identified. Selective catheterization was performed of the left uterine artery with a microcatheter and micro guide wire. A selective left uterine angiogram was performed. This demonstrated patency of the left uterine artery. Mild diffuse hypervascularity of the enlarged fibroid uterus. Access was adequate for embolization. For embolization, 3 vials of 500 - 700 micron Embospheres were injected into the left uterine artery. Post embolization angiogram confirms complete stasis of the left uterine vascular territory. Microcatheter was removed. The C2 catheter was retracted and utilized to select the right internal iliac artery. Selective right internal iliac angiogram was performed. The patent right uterine artery was identified. For selective catheterization, the micro catheter and guidewire were utilized to select the right uterine artery. Selective right uterine angiogram was performed.  This demonstrated patency of the right uterine artery. Catheter position was safe for embolization. Embolization was performed to complete stasis with injection of 1 and 1/3 vials of 500-700 micron Embospheres. Post embolization angiogram confirms complete stasis of the right uterine vascular territory. At this point, all wires, catheters and sheaths were removed from the patient. Hemostasis was achieved at the right groin access site with The patient tolerated the procedure well without immediate post procedural complication. FINDINGS: Bilateral internal iliac arteriograms demonstrates conventional branching pattern with widely patent origins of the bilateral uterine arteries. Sub selective bilateral uterine arteriograms demonstrate co-dominant supply to a hypertrophied myomatous uterus. Completion arteriograms following bilateral uterine artery particle embolization demonstrates a technically excellent result with stasis of flow within the bilateral uterine vascular territories. IMPRESSION: Successful bilateral uterine artery embolization (U F E). PLAN: The patient will be seen in follow-up consultation at the interventional radiology clinic in approximately 3-4 weeks. Electronically Signed   By: Sandi Mariscal M.D.   On: 11/17/2018 13:26   Ir Angiogram Selective Each Additional Vessel  Result Date: 11/17/2018 INDICATION: Symptomatic uterine fibroids. Patient presents today for bilateral uterine artery embolization. Please refer to formal consultation in the epic EMR dated 09/14/2018 for additional details. EXAM: Uterine Fibroid Embolization MEDICATIONS: Toradol 30 mg IV; Ancef 2 gm IV. The antibiotic was administered within one hour of the procedure ANESTHESIA/SEDATION: Moderate (conscious) sedation was employed during this procedure. A total of Versed 4 mg and Fentanyl 200 mcg was administered intravenously. Moderate Sedation Time: 60 minutes. The patient's level of consciousness and vital signs were monitored  continuously by radiology nursing throughout the procedure under my direct supervision. CONTRAST:  85 cc Omnipaque 300 FLUOROSCOPY TIME:  17 minutes, 54 seconds (758.8 mGy) COMPLICATIONS: None immediate. PROCEDURE: Informed consent was obtained from the patient following explanation of the procedure, risks, benefits and alternatives. The patient understands, agrees and consents for the procedure. All questions were addressed. A time out was  performed prior to the initiation of the procedure. Maximal barrier sterile technique utilized including caps, mask, sterile gowns, sterile gloves, large sterile drape, hand hygiene, and Betadine prep. The right femoral head was marked fluoroscopically. Under sterile conditions and local anesthesia, the right common femoral artery access was performed with a micropuncture needle. Under direct ultrasound guidance, the right common femoral was accessed with a micropuncture kit. An ultrasound image was saved for documentation purposes. This allowed for placement of a 5-French vascular sheath. A limited arteriogram was performed through the side arm of the sheath confirming appropriate access within the right common femoral artery. The 5-French C2 catheter was utilized to select the contralateral left internal iliac artery. Selective left internal iliac angiogram was performed. The tortuous left uterine artery was identified. Selective catheterization was performed of the left uterine artery with a microcatheter and micro guide wire. A selective left uterine angiogram was performed. This demonstrated patency of the left uterine artery. Mild diffuse hypervascularity of the enlarged fibroid uterus. Access was adequate for embolization. For embolization, 3 vials of 500 - 700 micron Embospheres were injected into the left uterine artery. Post embolization angiogram confirms complete stasis of the left uterine vascular territory. Microcatheter was removed. The C2 catheter was retracted  and utilized to select the right internal iliac artery. Selective right internal iliac angiogram was performed. The patent right uterine artery was identified. For selective catheterization, the micro catheter and guidewire were utilized to select the right uterine artery. Selective right uterine angiogram was performed. This demonstrated patency of the right uterine artery. Catheter position was safe for embolization. Embolization was performed to complete stasis with injection of 1 and 1/3 vials of 500-700 micron Embospheres. Post embolization angiogram confirms complete stasis of the right uterine vascular territory. At this point, all wires, catheters and sheaths were removed from the patient. Hemostasis was achieved at the right groin access site with The patient tolerated the procedure well without immediate post procedural complication. FINDINGS: Bilateral internal iliac arteriograms demonstrates conventional branching pattern with widely patent origins of the bilateral uterine arteries. Sub selective bilateral uterine arteriograms demonstrate co-dominant supply to a hypertrophied myomatous uterus. Completion arteriograms following bilateral uterine artery particle embolization demonstrates a technically excellent result with stasis of flow within the bilateral uterine vascular territories. IMPRESSION: Successful bilateral uterine artery embolization (U F E). PLAN: The patient will be seen in follow-up consultation at the interventional radiology clinic in approximately 3-4 weeks. Electronically Signed   By: Sandi Mariscal M.D.   On: 11/17/2018 13:26   Ir Angiogram Selective Each Additional Vessel  Result Date: 11/17/2018 INDICATION: Symptomatic uterine fibroids. Patient presents today for bilateral uterine artery embolization. Please refer to formal consultation in the epic EMR dated 09/14/2018 for additional details. EXAM: Uterine Fibroid Embolization MEDICATIONS: Toradol 30 mg IV; Ancef 2 gm IV. The  antibiotic was administered within one hour of the procedure ANESTHESIA/SEDATION: Moderate (conscious) sedation was employed during this procedure. A total of Versed 4 mg and Fentanyl 200 mcg was administered intravenously. Moderate Sedation Time: 60 minutes. The patient's level of consciousness and vital signs were monitored continuously by radiology nursing throughout the procedure under my direct supervision. CONTRAST:  85 cc Omnipaque 300 FLUOROSCOPY TIME:  17 minutes, 54 seconds (376.2 mGy) COMPLICATIONS: None immediate. PROCEDURE: Informed consent was obtained from the patient following explanation of the procedure, risks, benefits and alternatives. The patient understands, agrees and consents for the procedure. All questions were addressed. A time out was performed prior to the initiation of  the procedure. Maximal barrier sterile technique utilized including caps, mask, sterile gowns, sterile gloves, large sterile drape, hand hygiene, and Betadine prep. The right femoral head was marked fluoroscopically. Under sterile conditions and local anesthesia, the right common femoral artery access was performed with a micropuncture needle. Under direct ultrasound guidance, the right common femoral was accessed with a micropuncture kit. An ultrasound image was saved for documentation purposes. This allowed for placement of a 5-French vascular sheath. A limited arteriogram was performed through the side arm of the sheath confirming appropriate access within the right common femoral artery. The 5-French C2 catheter was utilized to select the contralateral left internal iliac artery. Selective left internal iliac angiogram was performed. The tortuous left uterine artery was identified. Selective catheterization was performed of the left uterine artery with a microcatheter and micro guide wire. A selective left uterine angiogram was performed. This demonstrated patency of the left uterine artery. Mild diffuse  hypervascularity of the enlarged fibroid uterus. Access was adequate for embolization. For embolization, 3 vials of 500 - 700 micron Embospheres were injected into the left uterine artery. Post embolization angiogram confirms complete stasis of the left uterine vascular territory. Microcatheter was removed. The C2 catheter was retracted and utilized to select the right internal iliac artery. Selective right internal iliac angiogram was performed. The patent right uterine artery was identified. For selective catheterization, the micro catheter and guidewire were utilized to select the right uterine artery. Selective right uterine angiogram was performed. This demonstrated patency of the right uterine artery. Catheter position was safe for embolization. Embolization was performed to complete stasis with injection of 1 and 1/3 vials of 500-700 micron Embospheres. Post embolization angiogram confirms complete stasis of the right uterine vascular territory. At this point, all wires, catheters and sheaths were removed from the patient. Hemostasis was achieved at the right groin access site with The patient tolerated the procedure well without immediate post procedural complication. FINDINGS: Bilateral internal iliac arteriograms demonstrates conventional branching pattern with widely patent origins of the bilateral uterine arteries. Sub selective bilateral uterine arteriograms demonstrate co-dominant supply to a hypertrophied myomatous uterus. Completion arteriograms following bilateral uterine artery particle embolization demonstrates a technically excellent result with stasis of flow within the bilateral uterine vascular territories. IMPRESSION: Successful bilateral uterine artery embolization (U F E). PLAN: The patient will be seen in follow-up consultation at the interventional radiology clinic in approximately 3-4 weeks. Electronically Signed   By: Sandi Mariscal M.D.   On: 11/17/2018 13:26   Ir US Guide Vasc Access  Right  Result Date: 11/17/2018 INDICATION: Symptomatic uterine fibroids. Patient presents today for bilateral uterine artery embolization. Please refer to formal consultation in the epic EMR dated 09/14/2018 for additional details. EXAM: Uterine Fibroid Embolization MEDICATIONS: Toradol 30 mg IV; Ancef 2 gm IV. The antibiotic was administered within one hour of the procedure ANESTHESIA/SEDATION: Moderate (conscious) sedation was employed during this procedure. A total of Versed 4 mg and Fentanyl 200 mcg was administered intravenously. Moderate Sedation Time: 60 minutes. The patient's level of consciousness and vital signs were monitored continuously by radiology nursing throughout the procedure under my direct supervision. CONTRAST:  85 cc Omnipaque 300 FLUOROSCOPY TIME:  17 minutes, 54 seconds (665.9 mGy) COMPLICATIONS: None immediate. PROCEDURE: Informed consent was obtained from the patient following explanation of the procedure, risks, benefits and alternatives. The patient understands, agrees and consents for the procedure. All questions were addressed. A time out was performed prior to the initiation of the procedure. Maximal barrier sterile technique  utilized including caps, mask, sterile gowns, sterile gloves, large sterile drape, hand hygiene, and Betadine prep. The right femoral head was marked fluoroscopically. Under sterile conditions and local anesthesia, the right common femoral artery access was performed with a micropuncture needle. Under direct ultrasound guidance, the right common femoral was accessed with a micropuncture kit. An ultrasound image was saved for documentation purposes. This allowed for placement of a 5-French vascular sheath. A limited arteriogram was performed through the side arm of the sheath confirming appropriate access within the right common femoral artery. The 5-French C2 catheter was utilized to select the contralateral left internal iliac artery. Selective left internal  iliac angiogram was performed. The tortuous left uterine artery was identified. Selective catheterization was performed of the left uterine artery with a microcatheter and micro guide wire. A selective left uterine angiogram was performed. This demonstrated patency of the left uterine artery. Mild diffuse hypervascularity of the enlarged fibroid uterus. Access was adequate for embolization. For embolization, 3 vials of 500 - 700 micron Embospheres were injected into the left uterine artery. Post embolization angiogram confirms complete stasis of the left uterine vascular territory. Microcatheter was removed. The C2 catheter was retracted and utilized to select the right internal iliac artery. Selective right internal iliac angiogram was performed. The patent right uterine artery was identified. For selective catheterization, the micro catheter and guidewire were utilized to select the right uterine artery. Selective right uterine angiogram was performed. This demonstrated patency of the right uterine artery. Catheter position was safe for embolization. Embolization was performed to complete stasis with injection of 1 and 1/3 vials of 500-700 micron Embospheres. Post embolization angiogram confirms complete stasis of the right uterine vascular territory. At this point, all wires, catheters and sheaths were removed from the patient. Hemostasis was achieved at the right groin access site with The patient tolerated the procedure well without immediate post procedural complication. FINDINGS: Bilateral internal iliac arteriograms demonstrates conventional branching pattern with widely patent origins of the bilateral uterine arteries. Sub selective bilateral uterine arteriograms demonstrate co-dominant supply to a hypertrophied myomatous uterus. Completion arteriograms following bilateral uterine artery particle embolization demonstrates a technically excellent result with stasis of flow within the bilateral uterine  vascular territories. IMPRESSION: Successful bilateral uterine artery embolization (U F E). PLAN: The patient will be seen in follow-up consultation at the interventional radiology clinic in approximately 3-4 weeks. Electronically Signed   By: Sandi Mariscal M.D.   On: 11/17/2018 13:26   Ir Embo Tumor Organ Ischemia Infarct Inc Guide Roadmapping  Result Date: 11/17/2018 INDICATION: Symptomatic uterine fibroids. Patient presents today for bilateral uterine artery embolization. Please refer to formal consultation in the epic EMR dated 09/14/2018 for additional details. EXAM: Uterine Fibroid Embolization MEDICATIONS: Toradol 30 mg IV; Ancef 2 gm IV. The antibiotic was administered within one hour of the procedure ANESTHESIA/SEDATION: Moderate (conscious) sedation was employed during this procedure. A total of Versed 4 mg and Fentanyl 200 mcg was administered intravenously. Moderate Sedation Time: 60 minutes. The patient's level of consciousness and vital signs were monitored continuously by radiology nursing throughout the procedure under my direct supervision. CONTRAST:  85 cc Omnipaque 300 FLUOROSCOPY TIME:  17 minutes, 54 seconds (517.6 mGy) COMPLICATIONS: None immediate. PROCEDURE: Informed consent was obtained from the patient following explanation of the procedure, risks, benefits and alternatives. The patient understands, agrees and consents for the procedure. All questions were addressed. A time out was performed prior to the initiation of the procedure. Maximal barrier sterile technique utilized including caps,  mask, sterile gowns, sterile gloves, large sterile drape, hand hygiene, and Betadine prep. The right femoral head was marked fluoroscopically. Under sterile conditions and local anesthesia, the right common femoral artery access was performed with a micropuncture needle. Under direct ultrasound guidance, the right common femoral was accessed with a micropuncture kit. An ultrasound image was saved for  documentation purposes. This allowed for placement of a 5-French vascular sheath. A limited arteriogram was performed through the side arm of the sheath confirming appropriate access within the right common femoral artery. The 5-French C2 catheter was utilized to select the contralateral left internal iliac artery. Selective left internal iliac angiogram was performed. The tortuous left uterine artery was identified. Selective catheterization was performed of the left uterine artery with a microcatheter and micro guide wire. A selective left uterine angiogram was performed. This demonstrated patency of the left uterine artery. Mild diffuse hypervascularity of the enlarged fibroid uterus. Access was adequate for embolization. For embolization, 3 vials of 500 - 700 micron Embospheres were injected into the left uterine artery. Post embolization angiogram confirms complete stasis of the left uterine vascular territory. Microcatheter was removed. The C2 catheter was retracted and utilized to select the right internal iliac artery. Selective right internal iliac angiogram was performed. The patent right uterine artery was identified. For selective catheterization, the micro catheter and guidewire were utilized to select the right uterine artery. Selective right uterine angiogram was performed. This demonstrated patency of the right uterine artery. Catheter position was safe for embolization. Embolization was performed to complete stasis with injection of 1 and 1/3 vials of 500-700 micron Embospheres. Post embolization angiogram confirms complete stasis of the right uterine vascular territory. At this point, all wires, catheters and sheaths were removed from the patient. Hemostasis was achieved at the right groin access site with The patient tolerated the procedure well without immediate post procedural complication. FINDINGS: Bilateral internal iliac arteriograms demonstrates conventional branching pattern with widely  patent origins of the bilateral uterine arteries. Sub selective bilateral uterine arteriograms demonstrate co-dominant supply to a hypertrophied myomatous uterus. Completion arteriograms following bilateral uterine artery particle embolization demonstrates a technically excellent result with stasis of flow within the bilateral uterine vascular territories. IMPRESSION: Successful bilateral uterine artery embolization (U F E). PLAN: The patient will be seen in follow-up consultation at the interventional radiology clinic in approximately 3-4 weeks. Electronically Signed   By: Sandi Mariscal M.D.   On: 11/17/2018 13:26    Labs:  CBC: Recent Labs    11/17/18 0758  WBC 6.8  HGB 12.9  HCT 41.4  PLT 319    COAGS: Recent Labs    11/17/18 0758  INR 0.96    BMP: Recent Labs    04/15/18 1350 07/20/18 0827 09/19/18 0927 10/05/18 0902 11/17/18 0758  NA 140 137  --  139 139  K 3.5 4.3  --  4.3 3.9  CL 106 103  --  104 110  CO2 26 27  --  28 21*  GLUCOSE 74 102*  --  129* 146*  BUN 13 11  --  12 13  CALCIUM 9.1 9.2  --  8.9 9.3  CREATININE 0.70 0.78 0.70 0.78 0.79  GFRNONAA  --   --   --   --  >60  GFRAA  --   --   --   --  >60    LIVER FUNCTION TESTS: Recent Labs    04/15/18 1350 07/20/18 0827  BILITOT 1.0 1.2  AST 16 17  ALT 13 15  ALKPHOS 67 64  PROT 6.8 6.9  ALBUMIN 3.9 4.0    Assessment and Plan: Patient with history of symptomatic uterine fibroids, status post bilateral uterine artery embolization on 11/17/2018; patient continues to have moderate to severe pelvic pain despite oral narcotics and IV Toradol.  Patient seen by Dr. Barbie Banner and Dilaudid PCA restarted.  Will observe for additional 24 hours.  Plans discussed with patient and nurse.  Follow-up with Dr. Barbie Banner in [place] clinic in 2 to 4 weeks post discharge.  Electronically Signed: D. Rowe Robert, PA-C 11/18/2018, 3:36 PM   I spent a total of 20 minutes at the the patient's bedside AND on the patient's hospital floor  or unit, greater than 50% of which was counseling/coordinating care for bilateral uterine artery embolization    Patient ID: Mayford Knife, female   DOB: 1976-05-19, 42 y.o.   MRN: [id number]

## 2018-11-19 DIAGNOSIS — D259 Leiomyoma of uterus, unspecified: Secondary | ICD-10-CM | POA: Diagnosis not present

## 2018-11-19 MED ORDER — DOCUSATE SODIUM 100 MG PO CAPS: 100.0000 mg | ORAL_CAPSULE | Freq: Two times a day (BID) | ORAL | 0 refills | Status: DC

## 2018-11-19 MED ORDER — HYDROCODONE-ACETAMINOPHEN 5-325 MG PO TABS: 1.0000 | ORAL_TABLET | ORAL | 0 refills | Status: DC | PRN

## 2018-11-19 MED ORDER — IBUPROFEN 200 MG PO TABS: 800.0000 mg | ORAL_TABLET | Freq: Three times a day (TID) | ORAL | 0 refills | Status: DC

## 2018-11-19 MED ORDER — PROMETHAZINE HCL 12.5 MG PO TABS: 12.5000 mg | ORAL_TABLET | Freq: Four times a day (QID) | ORAL | 0 refills | Status: DC | PRN

## 2018-11-19 NOTE — Discharge Summary (Signed)
Physician Discharge Summary      [name] MRN: [id number] DOB/AGE: January 23, 1976 42 y.o.  Admit date: 11/17/2018 Discharge date: 11/19/2018  Admission Diagnoses: Active Problems:   Uterine fibroid   Fibroids  Discharge Diagnoses:  Active Problems:   Uterine fibroid   Fibroids    Procedures: Uterine Fibroid Embolization 12/26  Discharged Condition: good  Hospital Course: Admitted 12/26 after successful [place]. Had continued poorly controlled pain for more than 24hrs. Finally pain subsided and better controlled. Tolerating regular diet. Feels much better POD #2. Stable for discharge, no other complications. All new prescriptions, home meds, and follow up instructions reviewed.   Consults: None   Discharge Exam: Blood pressure 117/63, pulse 81, temperature 98.2 F (36.8 C), temperature source Oral, resp. rate 16, height '5\' 5"'$  (1.651 m), weight 64.4 kg, SpO2 100 %. Lungs: CTA without w/r/r Heart: Regular Abd soft, NT (R)groin soft, NT  Disposition: Discharge disposition: 01-Home or Self Care       Discharge Instructions    Call MD for:  persistant nausea and vomiting   Complete by:  As directed    Call MD for:  redness, tenderness, or signs of infection (pain, swelling, redness, odor or green/yellow discharge around incision site)   Complete by:  As directed    Call MD for:  severe uncontrolled pain   Complete by:  As directed    Call MD for:  temperature >100.4   Complete by:  As directed    Diet - low sodium heart healthy   Complete by:  As directed    Increase activity slowly   Complete by:  As directed    May shower / Bathe   Complete by:  As directed    Remove dressing in 24 hours   Complete by:  As directed      Allergies as of 11/19/2018      Reactions   Other Anaphylaxis   Peaches , tomatoes, strawberry   Strawberry Extract Hives   Facial hives and mouth hives   Tomato Hives   Facial hives and mouth hives        Medication List    TAKE these medications   BD PEN NEEDLE NANO U/F 32G X 4 MM Misc Generic drug:  Insulin Pen Needle USE AS DIRECTED   blood glucose meter kit and supplies Kit Use as instructed to test blood sugar. 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   DULoxetine 60 MG capsule Commonly known as:  CYMBALTA Take 60 mg by mouth daily.   FARXIGA 5 MG Tabs tablet Generic drug:  dapagliflozin propanediol TAKE 1 TABLET BY MOUTH EVERY DAY **REPLACES JARDIANCE** What changed:  See the new instructions.   FREESTYLE LIBRE 14 DAY SENSOR Misc 1 UNITS BY DOES NOT APPLY ROUTE EVERY 14 (FOURTEEN) DAYS.   FUSION PLUS PO Take 1 capsule by mouth daily. Takes for anemia   glucose blood test strip Commonly known as:  CONTOUR NEXT TEST Use as instructed use to test blood sugar before and after each meal   HYDROcodone-acetaminophen 5-325 MG tablet Commonly known as:  NORCO/VICODIN Take 1-2 tablets by mouth every 4 (four) hours as needed for moderate pain.   ibuprofen 200 MG tablet Commonly known as:  ADVIL Take 4 tablets (800 mg total) by mouth 3 (three) times daily.   insulin aspart 100 UNIT/ML injection Commonly known as:  NOVOLOG Inject 45 Units into the skin daily.   insulin pump Soln Inject 1 each into the skin See admin instructions. Novolog 100 units/ml per insulin pump   multivitamin with minerals Tabs tablet Take 1 tablet by mouth daily.   Norethin Ace-Eth Estrad-FE 1-20 MG-MCG(24) Chew Chew 1 tablet by mouth daily.   promethazine 12.5 MG tablet Commonly known as:  PHENERGAN Take 1 tablet (12.5 mg total) by mouth every 6 (six) hours as needed for nausea or vomiting.      Follow-up Information    Sandi Mariscal, MD. Go in 3 week(s).   Specialties:  Interventional Radiology, Radiology Contact information: [address] [place] [address] [phone]            Signed: Ascencion Dike PA-C 11/19/2018, 8:29 AM

## 2018-11-19 NOTE — Discharge Instructions (Signed)
Uterine Artery Embolization for Fibroids, Care After  This sheet gives you information about how to care for yourself after your procedure. Your health care provider may also give you more specific instructions. If you have problems or questions, contact your health care provider.  What can I expect after the procedure?  After your procedure, it is common to have:  Pelvic cramping. You will be given pain medicine.  Nausea and vomiting. You may be given medicine to help relieve nausea.  Follow these instructions at home:  Incision care  Follow instructions from your health care provider about how to take care of your incision. Make sure you:  Wash your hands with soap and water before you change your bandage (dressing). If soap and water are not available, use hand sanitizer.  Change your dressing as told by your health care provider.  Check your incision area every day for signs of infection. Check for:  More redness, swelling, or pain.  More fluid or blood.  Warmth.  Pus or a bad smell.  Medicines    Take over-the-counter and prescription medicines only as told by your health care provider.  Do not take aspirin. It can cause bleeding.  Do not drive for 24 hours if you were given a medicine to help you relax (sedative).  Do not drive or use heavy machinery while taking prescription pain medicine.  General instructions  Ask your health care provider when you can resume sexual activity.  To prevent or treat constipation while you are taking prescription pain medicine, your health care provider may recommend that you:  Drink enough fluid to keep your urine clear or pale yellow.  Take over-the-counter or prescription medicines.  Eat foods that are high in fiber, such as fresh fruits and vegetables, whole grains, and beans.  Limit foods that are high in fat and processed sugars, such as fried and sweet foods.  Contact a health care provider if:  You have a fever.  You have more redness, swelling, or pain around your  incision site.  You have more fluid or blood coming from your incision site.  Your incision feels warm to the touch.  You have pus or a bad smell coming from your incision.  You have a rash.  You have uncontrolled nausea or you cannot eat or drink anything without vomiting.  Get help right away if:  You have trouble breathing.  You have chest pain.  You have severe abdominal pain.  You have leg pain.  You become dizzy and faint.  Summary  After your procedure, it is common to have pelvic cramping. You will be given pain medicine.  Follow instructions from your health care provider about how to take care of your incision.  Check your incision area every day for signs of infection.  Take over-the-counter and prescription medicines only as told by your health care provider.  This information is not intended to replace advice given to you by your health care provider. Make sure you discuss any questions you have with your health care provider.  Document Released: 08/30/2013 Document Revised: 02/11/2017 Document Reviewed: 02/11/2017  Elsevier Interactive Patient Education  2019 [employer]

## 2018-11-19 NOTE — Progress Notes (Signed)
Feeling much better this am. Tolerated reg diet last pm. BP 117/63 (BP Location: Right Arm)   Pulse 81   Temp 98.2 F (36.8 C) (Oral)   Resp 16   Ht 5\' 5"  (1.651 m)   Wt 64.4 kg   SpO2 100%   BMI 23.63 kg/m  Abd soft, NT (R)groin soft, NT  DC PCA Plan DC home later this am.  Ascencion Dike PA-C Interventional Radiology 11/19/2018 8:22 AM

## 2018-11-21 LAB — GLUCOSE, CAPILLARY
GLUCOSE-CAPILLARY: 114 mg/dL — AB (ref 70–99)
Glucose-Capillary: 147 mg/dL — ABNORMAL HIGH (ref 70–99)
Glucose-Capillary: 133 mg/dL — ABNORMAL HIGH (ref 70–99)
Glucose-Capillary: 146 mg/dL — ABNORMAL HIGH (ref 70–99)
Glucose-Capillary: 108 mg/dL — ABNORMAL HIGH (ref 70–99)

## 2018-12-01 ENCOUNTER — Other Ambulatory Visit: Payer: Self-pay

## 2018-12-01 ENCOUNTER — Telehealth: Payer: Self-pay | Admitting: Endocrinology

## 2018-12-01 MED ORDER — FREESTYLE LIBRE 14 DAY SENSOR MISC: 1.0000 [IU] | 2 refills | Status: DC

## 2018-12-01 NOTE — Telephone Encounter (Signed)
MEDICATION: Continuous Blood Gluc Sensor (FREESTYLE LIBRE 14 DAY SENSOR) MISC  PHARMACY:  [facility] on [address]  IS THIS A 90 DAY SUPPLY : No  IS PATIENT OUT OF MEDICATION: Yes  IF NOT; HOW MUCH IS LEFT: None  LAST APPOINTMENT DATE: @12 /16/2019  NEXT APPOINTMENT DATE:@1 /21/2020  DO WE HAVE YOUR PERMISSION TO LEAVE A DETAILED MESSAGE:Yes  OTHER COMMENTS: Pharmacy told patient that there are no more refills left on RX and that patient needs a new RX    **Let patient know to contact pharmacy at the end of the day to make sure medication is ready. **  ** Please notify patient to allow 48-72 hours to process**  **Encourage patient to contact the pharmacy for refills or they can request refills through [facility]**

## 2018-12-01 NOTE — Telephone Encounter (Signed)
Rx sent 

## 2018-12-13 ENCOUNTER — Ambulatory Visit: Payer: BC Managed Care – PPO | Admitting: Endocrinology

## 2018-12-13 ENCOUNTER — Ambulatory Visit
Admission: RE | Admit: 2018-12-13 | Discharge: 2018-12-13 | Disposition: A | Discharge status: Home / Self Care | Payer: BC Managed Care – PPO | Source: Ambulatory Visit | Attending: Radiology | Admitting: Radiology

## 2018-12-13 ENCOUNTER — Encounter: Payer: Self-pay | Admitting: Endocrinology

## 2018-12-13 VITALS — BP 110/70 | HR 80 | Ht 65.0 in | Wt 142.2 lb

## 2018-12-13 DIAGNOSIS — E1165 Type 2 diabetes mellitus with hyperglycemia: Secondary | ICD-10-CM | POA: Diagnosis not present

## 2018-12-13 DIAGNOSIS — Z794 Long term (current) use of insulin: Secondary | ICD-10-CM | POA: Diagnosis not present

## 2018-12-13 DIAGNOSIS — D259 Leiomyoma of uterus, unspecified: Secondary | ICD-10-CM

## 2018-12-13 HISTORY — PX: IR RADIOLOGIST EVAL & MGMT: IMG5224

## 2018-12-13 NOTE — Progress Notes (Signed)
[name], female   DOB: [birth year], 43 y.o.   MRN: [id number]    Reason for Appointment: Insulin pump follow-up   History of Present Illness   Diagnosis: date of diagnosis: 2009, initially asymptomatic, has type 2 diabetes.   RECENT history:     A1c is 7.1 in 11/19; previously 7.7  Current treatment regimen:   OMNIPOD insulin pump with basal rate: 0.35 until 4 PM, 0.3 until 6 PM and then 0.35,  I/C 12, ISF: 60, Target: 110, with correction over 120. Current basal 31% in bolus 69% of insulin Total insulin dose recently 20 units/day   Non-insulin hypoglycemic drugs: Farxiga 5 mg daily  Blood sugar patterns, management and problems identified:  She only started using the freestyle libre about a week or so ago  However previously was checking blood sugars fairly often with her Contour meter and her average blood sugars were about 140 in the morning and midday and 150+ in the afternoon and evening  However her blood sugars are excellent with only occasional postprandial hyperglycemia especially in the afternoon and evening  She says that after her last visit she was tending to drop lower after her boluses and she changed her correction factor to 1: 12 instead of 10  She is now having tendency to periodic hypoglycemia before dinnertime and also occasionally if she is late for lunch  Overall she is liking the pump and has stable blood sugar control  Most of her variability is because of inconsistent ability to bolus appropriately for evening meals  She may tend to have very significant high readings 2 to 3 hours after eating higher fat meals such as pizza or cake  She is not aware of using the extended bolus  Only occasionally will have high readings after lunch  Her weight has stayed the same approximately but she is not exercising  She is liking the pump for the convenience  Side effects from medications: Diarrhea from metformin and abdominal  discomfort  Glucometer: Contour  CGM use % of time  50  2-week average/SD  129+/-54  Time in range      78 %  % Time Above 180  13  % Time above 250   % Time Below 70 9     PRE-MEAL Fasting Lunch Dinner Bedtime Overall  Glucose range:       Averages:  117   114   129   POST-MEAL PC Breakfast PC Lunch PC Dinner  Glucose range:     Averages:  136  129  178     Meals: 3 meals per day, vegetarian, tries to get protein like nuts, [name] yogurt, soy, fish  Dinner usually at 7-8 pm.Cheese sandwiich, or vegetable soup with cheese, may have 1-2 starches per meal Sometimes will have  peanut butter sandwich for lunch  Physical activity: exercise is minimal, mostly walking stairs, upto 6,000 steps per day  Dietician visit: Most recent: 03/2013  CDE visit: 10/15    Wt Readings from Last 3 Encounters:  12/13/18 142 lb 3.2 oz (64.5 kg)  11/17/18 142 lb (64.4 kg)  11/07/18 145 lb (65.8 kg)    LABS:  Lab Results  Component Value Date   HGBA1C 7.1 (H) 10/05/2018   HGBA1C 7.7 (H) 07/20/2018   HGBA1C 7.5 (H) 04/15/2018   Lab Results  Component Value Date   MICROALBUR <0.7 07/20/2018   LDLCALC 94 07/20/2018   CREATININE 0.79 11/17/2018      No  visits with results within 1 Week(s) from this visit.  Latest known visit with results is:  Admission on 11/17/2018, Discharged on 11/19/2018  Component Date Value Ref Range Status  . Prothrombin Time 11/17/2018 12.7  11.4 - 15.2 seconds Final  . INR 11/17/2018 0.96   Final   Performed at [facility], [place] [address] [place], [place] [address]  . Sodium 11/17/2018 139  135 - 145 mmol/L Final  . Potassium 11/17/2018 3.9  3.5 - 5.1 mmol/L Final  . Chloride 11/17/2018 110  98 - 111 mmol/L Final  . CO2 11/17/2018 21* 22 - 32 mmol/L Final  . Glucose, Bld 11/17/2018 146* 70 - 99 mg/dL Final  . BUN 11/17/2018 13  6 - 20 mg/dL Final  . Creatinine, Ser 11/17/2018 0.79  0.44 - 1.00 mg/dL Final  . Calcium 11/17/2018 9.3   8.9 - 10.3 mg/dL Final  . GFR calc non Af Amer 11/17/2018 >60  >60 mL/min Final  . GFR calc Af Amer 11/17/2018 >60  >60 mL/min Final  . Anion gap 11/17/2018 8  5 - 15 Final   Performed at [facility], [place] [address] [place], [place] [address]  . WBC 11/17/2018 6.8  4.0 - 10.5 K/uL Final  . RBC 11/17/2018 4.86  3.87 - 5.11 MIL/uL Final  . Hemoglobin 11/17/2018 12.9  12.0 - 15.0 g/dL Final  . HCT 11/17/2018 41.4  36.0 - 46.0 % Final  . MCV 11/17/2018 85.2  80.0 - 100.0 fL Final  . MCH 11/17/2018 26.5  26.0 - 34.0 pg Final  . MCHC 11/17/2018 31.2  30.0 - 36.0 g/dL Final  . RDW 11/17/2018 14.1  11.5 - 15.5 % Final  . Platelets 11/17/2018 319  150 - 400 K/uL Final  . nRBC 11/17/2018 0.0  0.0 - 0.2 % Final  . Neutrophils Relative % 11/17/2018 69  % Final  . Neutro Abs 11/17/2018 4.7  1.7 - 7.7 K/uL Final  . Lymphocytes Relative 11/17/2018 20  % Final  . Lymphs Abs 11/17/2018 1.4  0.7 - 4.0 K/uL Final  . Monocytes Relative 11/17/2018 9  % Final  . Monocytes Absolute 11/17/2018 0.6  0.1 - 1.0 K/uL Final  . Eosinophils Relative 11/17/2018 1  % Final  . Eosinophils Absolute 11/17/2018 0.1  0.0 - 0.5 K/uL Final  . Basophils Relative 11/17/2018 1  % Final  . Basophils Absolute 11/17/2018 0.0  0.0 - 0.1 K/uL Final  . Immature Granulocytes 11/17/2018 0  % Final  . Abs Immature Granulocytes 11/17/2018 0.01  0.00 - 0.07 K/uL Final   Performed at [facility], [place] [address] [place], [place] [address]  . Preg, Serum 11/17/2018 NEGATIVE  NEGATIVE Final   Comment:        THE SENSITIVITY OF THIS METHODOLOGY IS >10 mIU/mL. Performed at [facility], [place] [address] [place], [place] [address]   . Glucose-Capillary 11/17/2018 126* 70 - 99 mg/dL Final  . Glucose-Capillary 11/17/2018 158* 70 - 99 mg/dL Final  . Comment 1 11/17/2018 Notify RN   Final  . Comment 2 11/17/2018 Document in Chart   Final  . Glucose-Capillary 11/17/2018 151* 70 - 99  mg/dL Final  . Comment 1 11/17/2018 Notify RN   Final  . Comment 2 11/17/2018 Document in Chart   Final  . Glucose-Capillary 11/17/2018 144* 70 - 99 mg/dL Final  . Comment 1 11/17/2018 Notify RN   Final  . Glucose-Capillary 11/18/2018 156* 70 - 99  mg/dL Final  . Comment 1 11/18/2018 Notify RN   Final  . Glucose-Capillary 11/18/2018 149* 70 - 99 mg/dL Final  . Comment 1 11/18/2018 Notify RN   Final  . Comment 2 11/18/2018 Document in Chart   Final  . Glucose-Capillary 11/19/2018 114* 70 - 99 mg/dL Final  . Comment 1 11/19/2018 Notify RN   Final  . Comment 2 11/19/2018 Document in Chart   Final  . Glucose-Capillary 11/19/2018 108* 70 - 99 mg/dL Final  . Glucose-Capillary 11/18/2018 147* 70 - 99 mg/dL Final  . Comment 1 11/18/2018 Notify RN   Final  . Comment 2 11/18/2018 Document in Chart   Final  . Glucose-Capillary 11/18/2018 133* 70 - 99 mg/dL Final  . Comment 1 11/18/2018 Notify RN   Final  . Comment 2 11/18/2018 Document in Chart   Final  . Glucose-Capillary 11/18/2018 146* 70 - 99 mg/dL Final    Allergies as of 12/13/2018      Reactions   Other Anaphylaxis   Peaches , tomatoes, strawberry   Strawberry Extract Hives   Facial hives and mouth hives   Tomato Hives   Facial hives and mouth hives      Medication List       Accurate as of December 13, 2018  8:56 AM. Always use your most recent med list.        blood glucose meter kit and supplies Kit Use as instructed to test blood sugar. DX: E11.09   DULoxetine 60 MG capsule Commonly known as:  CYMBALTA Take 60 mg by mouth daily.   FARXIGA 5 MG Tabs tablet Generic drug:  dapagliflozin propanediol TAKE 1 TABLET BY MOUTH EVERY DAY **REPLACES JARDIANCE**   FREESTYLE LIBRE 14 DAY SENSOR Misc 1 Units by Does not apply route every 14 (fourteen) days.   FUSION PLUS PO Take 1 capsule by mouth daily. Takes for anemia   glucose blood test strip Commonly known as:  CONTOUR NEXT TEST Use as instructed use to test blood  sugar before and after each meal   insulin aspart 100 UNIT/ML injection Commonly known as:  NOVOLOG Inject 45 Units into the skin daily.   insulin pump Soln Inject 1 each into the skin See admin instructions. Novolog 100 units/ml per insulin pump   multivitamin with minerals Tabs tablet Take 1 tablet by mouth daily.   Norethin Ace-Eth Estrad-FE 1-20 MG-MCG(24) Chew Chew 1 tablet by mouth daily.   promethazine 12.5 MG tablet Commonly known as:  PHENERGAN Take 1 tablet (12.5 mg total) by mouth every 6 (six) hours as needed for nausea or vomiting.       Allergies:  Allergies  Allergen Reactions  . Other Anaphylaxis    Peaches , tomatoes, strawberry  . Strawberry Extract Hives    Facial hives and mouth hives  . Tomato Hives    Facial hives and mouth hives    Past Medical History:  Diagnosis Date  . Back pain   . Diabetes mellitus   . Fibroids   . PCOS (polycystic ovarian syndrome)     Past Surgical History:  Procedure Laterality Date  . BREAST EXCISIONAL BIOPSY Right   . BREAST SURGERY     benign  . IR ANGIOGRAM PELVIS SELECTIVE OR SUPRASELECTIVE  11/17/2018  . IR ANGIOGRAM PELVIS SELECTIVE OR SUPRASELECTIVE  11/17/2018  . IR ANGIOGRAM SELECTIVE EACH ADDITIONAL VESSEL  11/17/2018  . IR ANGIOGRAM SELECTIVE EACH ADDITIONAL VESSEL  11/17/2018  . IR EMBO TUMOR ORGAN ISCHEMIA INFARCT INC  GUIDE ROADMAPPING  11/17/2018  . IR RADIOLOGIST EVAL & MGMT  09/14/2018  . IR US GUIDE VASC ACCESS RIGHT  11/17/2018  . UTERINE FIBROID SURGERY      Family History  Problem Relation Age of Onset  . Hyperlipidemia Other   . Hypertension Other   . Stroke Other   . Cancer Other   . Diabetes Maternal Uncle   . Heart disease Paternal Uncle   . Diabetes Maternal Grandmother   . Heart disease Maternal Grandmother   . Breast cancer Maternal Grandmother        diagnosed in her 44's  . Diabetes Paternal Grandmother     Social History:  reports that she has never smoked. She has  never used smokeless tobacco. She reports that she does not drink alcohol or use drugs.  Review of Systems -  Has high LDL at times, last labs show improved results    Lab Results  Component Value Date   CHOL 187 07/20/2018   HDL 84.80 07/20/2018   LDLCALC 94 07/20/2018   TRIG 39.0 07/20/2018   CHOLHDL 2 07/20/2018       Examination:   BP 110/70 (BP Location: Left Arm, Patient Position: Sitting, Cuff Size: Normal)   Pulse 80   Ht _0  (1.651 m)   Wt 142 lb 3.2 oz (64.5 kg)   SpO2 97%   BMI 23.66 kg/m   Body mass index is 23.66 kg/m.    Assesment:   DIABETES type 2:  See history of present illness for detailed discussion of  current management, blood sugar patterns and problems identified  Her last A1c was 7.1  Even though she is requiring very small doses of insulin for her basal she is occasionally still having significant postprandial hyperglycemia depending on her carbohydrate fat intake  She having tendency to low blood sugars before lunch and dinner especially of late for meals indicating excessive basal insulin This is despite her basal rate only being 0.2 5 in the afternoon Her hyperglycemia recently is related to not using extended boluses for higher fat meals and not adding extra if eating high fat meals or desserts   Recommendations today: Basal rate changes: 12 noon = 0.20 6 PM = 0.275 and 8 PM = 0.35 Blood sugar correction threshold will be raised up to 130 No change in other parameters and she will still use 1: 12 carbohydrate ratio For higher fat meals she can add at least 2 to 4 units more and extend the bolus up to 3 hours She will need to start regular exercise   There are no Patient Instructions on file for this visit.   Counseling time on subjects discussed in assessment and plan sections is over 50% of today's 25 minute visit   Elayne Snare 12/13/2018, 8:56 AM   Note: This office note was prepared with Dragon voice recognition system  technology. Any transcriptional errors that result from this process are unintentional.

## 2018-12-13 NOTE — Progress Notes (Signed)
Chief Complaint: S/P Uterine artery embolization  Referring Physician(s): Christophe Louis  Supervising Physician: Sandi Mariscal  History of Present Illness: Mia Wilson is a 43 y.o. female with history of symptomatic uterine fibroids which caused anemia.  She underwent bilateral uterine artery embolization by Dr. Pascal Lux on 11/17/2018.  She is here today for follow up.  She is doing very well and is satisfied with the procedure.  She states her periods seem much lighter. She does not have severe cramping like she did before.  She c/o an occasional "twinge" in her back, but this doesn't sound too alarming and she wasn't worried about it.  She also reported bruising at the femoral artery access sites but states it is "finally resolving".  Past Medical History:  Diagnosis Date  . Back pain   . Diabetes mellitus   . Fibroids   . PCOS (polycystic ovarian syndrome)     Past Surgical History:  Procedure Laterality Date  . BREAST EXCISIONAL BIOPSY Right   . BREAST SURGERY     benign  . IR ANGIOGRAM PELVIS SELECTIVE OR SUPRASELECTIVE  11/17/2018  . IR ANGIOGRAM PELVIS SELECTIVE OR SUPRASELECTIVE  11/17/2018  . IR ANGIOGRAM SELECTIVE EACH ADDITIONAL VESSEL  11/17/2018  . IR ANGIOGRAM SELECTIVE EACH ADDITIONAL VESSEL  11/17/2018  . IR EMBO TUMOR ORGAN ISCHEMIA INFARCT INC GUIDE ROADMAPPING  11/17/2018  . IR RADIOLOGIST EVAL & MGMT  09/14/2018  . IR US GUIDE VASC ACCESS RIGHT  11/17/2018  . UTERINE FIBROID SURGERY      Allergies: Other; Strawberry extract; and Tomato  Medications: Prior to Admission medications   Medication Sig Start Date End Date Taking? Authorizing Provider  blood glucose meter kit and supplies KIT Use as instructed to test blood sugar. DX: E11.09 03/08/15   Elayne Snare, MD  Continuous Blood Gluc Sensor (FREESTYLE LIBRE 14 DAY SENSOR) MISC 1 Units by Does not apply route every 14 (fourteen) days. 12/01/18   Elayne Snare, MD  DULoxetine (CYMBALTA) 60 MG  capsule Take 60 mg by mouth daily.    [provider]  FARXIGA 5 MG TABS tablet TAKE 1 TABLET BY MOUTH EVERY DAY **REPLACES JARDIANCE** Patient taking differently: Take 5 mg by mouth daily.  11/07/18   Elayne Snare, MD  glucose blood (CONTOUR NEXT TEST) test strip Use as instructed use to test blood sugar before and after each meal 10/12/18   Elayne Snare, MD  insulin aspart (NOVOLOG) 100 UNIT/ML injection Inject 45 Units into the skin daily. 10/12/18   Elayne Snare, MD  Insulin Human (INSULIN PUMP) SOLN Inject 1 each into the skin See admin instructions. Novolog 100 units/ml per insulin pump    [provider]  Iron-FA-B Cmp-C-Biot-Probiotic (FUSION PLUS PO) Take 1 capsule by mouth daily. Takes for anemia    [provider]  Multiple Vitamin (MULTIVITAMIN WITH MINERALS) TABS Take 1 tablet by mouth daily.    [provider]  Norethin Ace-Eth Estrad-FE 1-20 MG-MCG(24) CHEW Chew 1 tablet by mouth daily.  09/03/18   [provider]  promethazine (PHENERGAN) 12.5 MG tablet Take 1 tablet (12.5 mg total) by mouth every 6 (six) hours as needed for nausea or vomiting. 11/19/18   Ascencion Dike, PA-C     Family History  Problem Relation Age of Onset  . Hyperlipidemia Other   . Hypertension Other   . Stroke Other   . Cancer Other   . Diabetes Maternal Uncle   . Heart disease Paternal Uncle   . Diabetes  Maternal Grandmother   . Heart disease Maternal Grandmother   . Breast cancer Maternal Grandmother        diagnosed in her 68's  . Diabetes Paternal Grandmother     Social History   Socioeconomic History  . Marital status: Married    Spouse name: Not on file  . Number of children: Not on file  . Years of education: Not on file  . Highest education level: Not on file  Occupational History  . Not on file  Social Needs  . Financial resource strain: Not on file  . Food insecurity:    Worry: Not on file    Inability: Not on file  . Transportation  needs:    Medical: Not on file    Non-medical: Not on file  Tobacco Use  . Smoking status: Never Smoker  . Smokeless tobacco: Never Used  Substance and Sexual Activity  . Alcohol use: No  . Drug use: No  . Sexual activity: Yes  Lifestyle  . Physical activity:    Days per week: Not on file    Minutes per session: Not on file  . Stress: Not on file  Relationships  . Social connections:    Talks on phone: Not on file    Gets together: Not on file    Attends religious service: Not on file    Active member of club or organization: Not on file    Attends meetings of clubs or organizations: Not on file    Relationship status: Not on file  Other Topics Concern  . Not on file  Social History Narrative   Patient moved here from [place] August of 2012. She is not employed at this time. She has been her 2 youngest adopted children at home. Not involved a lot else here at this time. She has 2 other children also. She walks a little bit but does not do a lot since she has left her with fatigue.     Review of Systems: A 12 point ROS discussed and pertinent positives are indicated in the HPI above.  All other systems are negative.  Review of Systems  Vital Signs: BP 126/73   Pulse 72   Temp 97.6 F (36.4 C) (Oral)   Resp 15   Ht 5' 5.5" (1.664 m)   Wt 64.4 kg   LMP 11/10/2018 (Approximate)   SpO2 99%   BMI 23.27 kg/m   Physical Exam Vitals signs reviewed.  Constitutional:      Appearance: Normal appearance.  HENT:     Head: Normocephalic and atraumatic.  Eyes:     Extraocular Movements: Extraocular movements intact.  Neck:     Musculoskeletal: Normal range of motion.  Cardiovascular:     Rate and Rhythm: Normal rate and regular rhythm.  Pulmonary:     Effort: Pulmonary effort is normal.     Breath sounds: Normal breath sounds.  Abdominal:     General: There is no distension.     Palpations: Abdomen is soft.     Tenderness: There is no abdominal tenderness.    Musculoskeletal: Normal range of motion.  Skin:    General: Skin is warm and dry.  Neurological:     General: No focal deficit present.     Mental Status: She is alert and oriented to person, place, and time.  Psychiatric:        Mood and Affect: Mood normal.        Behavior: Behavior normal.  Thought Content: Thought content normal.        Judgment: Judgment normal.       Imaging: Ir Angiogram Pelvis Selective Or Supraselective  Result Date: 11/17/2018 INDICATION: Symptomatic uterine fibroids. Patient presents today for bilateral uterine artery embolization. Please refer to formal consultation in the epic EMR dated 09/14/2018 for additional details. EXAM: Uterine Fibroid Embolization MEDICATIONS: Toradol 30 mg IV; Ancef 2 gm IV. The antibiotic was administered within one hour of the procedure ANESTHESIA/SEDATION: Moderate (conscious) sedation was employed during this procedure. A total of Versed 4 mg and Fentanyl 200 mcg was administered intravenously. Moderate Sedation Time: 60 minutes. The patient's level of consciousness and vital signs were monitored continuously by radiology nursing throughout the procedure under my direct supervision. CONTRAST:  85 cc Omnipaque 300 FLUOROSCOPY TIME:  17 minutes, 54 seconds (212.2 mGy) COMPLICATIONS: None immediate. PROCEDURE: Informed consent was obtained from the patient following explanation of the procedure, risks, benefits and alternatives. The patient understands, agrees and consents for the procedure. All questions were addressed. A time out was performed prior to the initiation of the procedure. Maximal barrier sterile technique utilized including caps, mask, sterile gowns, sterile gloves, large sterile drape, hand hygiene, and Betadine prep. The right femoral head was marked fluoroscopically. Under sterile conditions and local anesthesia, the right common femoral artery access was performed with a micropuncture needle. Under direct  ultrasound guidance, the right common femoral was accessed with a micropuncture kit. An ultrasound image was saved for documentation purposes. This allowed for placement of a 5-French vascular sheath. A limited arteriogram was performed through the side arm of the sheath confirming appropriate access within the right common femoral artery. The 5-French C2 catheter was utilized to select the contralateral left internal iliac artery. Selective left internal iliac angiogram was performed. The tortuous left uterine artery was identified. Selective catheterization was performed of the left uterine artery with a microcatheter and micro guide wire. A selective left uterine angiogram was performed. This demonstrated patency of the left uterine artery. Mild diffuse hypervascularity of the enlarged fibroid uterus. Access was adequate for embolization. For embolization, 3 vials of 500 - 700 micron Embospheres were injected into the left uterine artery. Post embolization angiogram confirms complete stasis of the left uterine vascular territory. Microcatheter was removed. The C2 catheter was retracted and utilized to select the right internal iliac artery. Selective right internal iliac angiogram was performed. The patent right uterine artery was identified. For selective catheterization, the micro catheter and guidewire were utilized to select the right uterine artery. Selective right uterine angiogram was performed. This demonstrated patency of the right uterine artery. Catheter position was safe for embolization. Embolization was performed to complete stasis with injection of 1 and 1/3 vials of 500-700 micron Embospheres. Post embolization angiogram confirms complete stasis of the right uterine vascular territory. At this point, all wires, catheters and sheaths were removed from the patient. Hemostasis was achieved at the right groin access site with The patient tolerated the procedure well without immediate post procedural  complication. FINDINGS: Bilateral internal iliac arteriograms demonstrates conventional branching pattern with widely patent origins of the bilateral uterine arteries. Sub selective bilateral uterine arteriograms demonstrate co-dominant supply to a hypertrophied myomatous uterus. Completion arteriograms following bilateral uterine artery particle embolization demonstrates a technically excellent result with stasis of flow within the bilateral uterine vascular territories. IMPRESSION: Successful bilateral uterine artery embolization (U F E). PLAN: The patient will be seen in follow-up consultation at the interventional radiology clinic in approximately 3-4 weeks.  Electronically Signed   By: Sandi Mariscal M.D.   On: 11/17/2018 13:26   Ir Angiogram Pelvis Selective Or Supraselective  Result Date: 11/17/2018 INDICATION: Symptomatic uterine fibroids. Patient presents today for bilateral uterine artery embolization. Please refer to formal consultation in the epic EMR dated 09/14/2018 for additional details. EXAM: Uterine Fibroid Embolization MEDICATIONS: Toradol 30 mg IV; Ancef 2 gm IV. The antibiotic was administered within one hour of the procedure ANESTHESIA/SEDATION: Moderate (conscious) sedation was employed during this procedure. A total of Versed 4 mg and Fentanyl 200 mcg was administered intravenously. Moderate Sedation Time: 60 minutes. The patient's level of consciousness and vital signs were monitored continuously by radiology nursing throughout the procedure under my direct supervision. CONTRAST:  85 cc Omnipaque 300 FLUOROSCOPY TIME:  17 minutes, 54 seconds (458.5 mGy) COMPLICATIONS: None immediate. PROCEDURE: Informed consent was obtained from the patient following explanation of the procedure, risks, benefits and alternatives. The patient understands, agrees and consents for the procedure. All questions were addressed. A time out was performed prior to the initiation of the procedure. Maximal barrier  sterile technique utilized including caps, mask, sterile gowns, sterile gloves, large sterile drape, hand hygiene, and Betadine prep. The right femoral head was marked fluoroscopically. Under sterile conditions and local anesthesia, the right common femoral artery access was performed with a micropuncture needle. Under direct ultrasound guidance, the right common femoral was accessed with a micropuncture kit. An ultrasound image was saved for documentation purposes. This allowed for placement of a 5-French vascular sheath. A limited arteriogram was performed through the side arm of the sheath confirming appropriate access within the right common femoral artery. The 5-French C2 catheter was utilized to select the contralateral left internal iliac artery. Selective left internal iliac angiogram was performed. The tortuous left uterine artery was identified. Selective catheterization was performed of the left uterine artery with a microcatheter and micro guide wire. A selective left uterine angiogram was performed. This demonstrated patency of the left uterine artery. Mild diffuse hypervascularity of the enlarged fibroid uterus. Access was adequate for embolization. For embolization, 3 vials of 500 - 700 micron Embospheres were injected into the left uterine artery. Post embolization angiogram confirms complete stasis of the left uterine vascular territory. Microcatheter was removed. The C2 catheter was retracted and utilized to select the right internal iliac artery. Selective right internal iliac angiogram was performed. The patent right uterine artery was identified. For selective catheterization, the micro catheter and guidewire were utilized to select the right uterine artery. Selective right uterine angiogram was performed. This demonstrated patency of the right uterine artery. Catheter position was safe for embolization. Embolization was performed to complete stasis with injection of 1 and 1/3 vials of 500-700  micron Embospheres. Post embolization angiogram confirms complete stasis of the right uterine vascular territory. At this point, all wires, catheters and sheaths were removed from the patient. Hemostasis was achieved at the right groin access site with The patient tolerated the procedure well without immediate post procedural complication. FINDINGS: Bilateral internal iliac arteriograms demonstrates conventional branching pattern with widely patent origins of the bilateral uterine arteries. Sub selective bilateral uterine arteriograms demonstrate co-dominant supply to a hypertrophied myomatous uterus. Completion arteriograms following bilateral uterine artery particle embolization demonstrates a technically excellent result with stasis of flow within the bilateral uterine vascular territories. IMPRESSION: Successful bilateral uterine artery embolization (U F E). PLAN: The patient will be seen in follow-up consultation at the interventional radiology clinic in approximately 3-4 weeks. Electronically Signed   By: Jenny Reichmann  Watts M.D.   On: 11/17/2018 13:26   Ir Angiogram Selective Each Additional Vessel  Result Date: 11/17/2018 INDICATION: Symptomatic uterine fibroids. Patient presents today for bilateral uterine artery embolization. Please refer to formal consultation in the epic EMR dated 09/14/2018 for additional details. EXAM: Uterine Fibroid Embolization MEDICATIONS: Toradol 30 mg IV; Ancef 2 gm IV. The antibiotic was administered within one hour of the procedure ANESTHESIA/SEDATION: Moderate (conscious) sedation was employed during this procedure. A total of Versed 4 mg and Fentanyl 200 mcg was administered intravenously. Moderate Sedation Time: 60 minutes. The patient's level of consciousness and vital signs were monitored continuously by radiology nursing throughout the procedure under my direct supervision. CONTRAST:  85 cc Omnipaque 300 FLUOROSCOPY TIME:  17 minutes, 54 seconds (242.3 mGy) COMPLICATIONS:  None immediate. PROCEDURE: Informed consent was obtained from the patient following explanation of the procedure, risks, benefits and alternatives. The patient understands, agrees and consents for the procedure. All questions were addressed. A time out was performed prior to the initiation of the procedure. Maximal barrier sterile technique utilized including caps, mask, sterile gowns, sterile gloves, large sterile drape, hand hygiene, and Betadine prep. The right femoral head was marked fluoroscopically. Under sterile conditions and local anesthesia, the right common femoral artery access was performed with a micropuncture needle. Under direct ultrasound guidance, the right common femoral was accessed with a micropuncture kit. An ultrasound image was saved for documentation purposes. This allowed for placement of a 5-French vascular sheath. A limited arteriogram was performed through the side arm of the sheath confirming appropriate access within the right common femoral artery. The 5-French C2 catheter was utilized to select the contralateral left internal iliac artery. Selective left internal iliac angiogram was performed. The tortuous left uterine artery was identified. Selective catheterization was performed of the left uterine artery with a microcatheter and micro guide wire. A selective left uterine angiogram was performed. This demonstrated patency of the left uterine artery. Mild diffuse hypervascularity of the enlarged fibroid uterus. Access was adequate for embolization. For embolization, 3 vials of 500 - 700 micron Embospheres were injected into the left uterine artery. Post embolization angiogram confirms complete stasis of the left uterine vascular territory. Microcatheter was removed. The C2 catheter was retracted and utilized to select the right internal iliac artery. Selective right internal iliac angiogram was performed. The patent right uterine artery was identified. For selective catheterization,  the micro catheter and guidewire were utilized to select the right uterine artery. Selective right uterine angiogram was performed. This demonstrated patency of the right uterine artery. Catheter position was safe for embolization. Embolization was performed to complete stasis with injection of 1 and 1/3 vials of 500-700 micron Embospheres. Post embolization angiogram confirms complete stasis of the right uterine vascular territory. At this point, all wires, catheters and sheaths were removed from the patient. Hemostasis was achieved at the right groin access site with The patient tolerated the procedure well without immediate post procedural complication. FINDINGS: Bilateral internal iliac arteriograms demonstrates conventional branching pattern with widely patent origins of the bilateral uterine arteries. Sub selective bilateral uterine arteriograms demonstrate co-dominant supply to a hypertrophied myomatous uterus. Completion arteriograms following bilateral uterine artery particle embolization demonstrates a technically excellent result with stasis of flow within the bilateral uterine vascular territories. IMPRESSION: Successful bilateral uterine artery embolization (U F E). PLAN: The patient will be seen in follow-up consultation at the interventional radiology clinic in approximately 3-4 weeks. Electronically Signed   By: Sandi Mariscal M.D.   On: 11/17/2018  13:26   Ir Angiogram Selective Each Additional Vessel  Result Date: 11/17/2018 INDICATION: Symptomatic uterine fibroids. Patient presents today for bilateral uterine artery embolization. Please refer to formal consultation in the epic EMR dated 09/14/2018 for additional details. EXAM: Uterine Fibroid Embolization MEDICATIONS: Toradol 30 mg IV; Ancef 2 gm IV. The antibiotic was administered within one hour of the procedure ANESTHESIA/SEDATION: Moderate (conscious) sedation was employed during this procedure. A total of Versed 4 mg and Fentanyl 200 mcg was  administered intravenously. Moderate Sedation Time: 60 minutes. The patient's level of consciousness and vital signs were monitored continuously by radiology nursing throughout the procedure under my direct supervision. CONTRAST:  85 cc Omnipaque 300 FLUOROSCOPY TIME:  17 minutes, 54 seconds (308.6 mGy) COMPLICATIONS: None immediate. PROCEDURE: Informed consent was obtained from the patient following explanation of the procedure, risks, benefits and alternatives. The patient understands, agrees and consents for the procedure. All questions were addressed. A time out was performed prior to the initiation of the procedure. Maximal barrier sterile technique utilized including caps, mask, sterile gowns, sterile gloves, large sterile drape, hand hygiene, and Betadine prep. The right femoral head was marked fluoroscopically. Under sterile conditions and local anesthesia, the right common femoral artery access was performed with a micropuncture needle. Under direct ultrasound guidance, the right common femoral was accessed with a micropuncture kit. An ultrasound image was saved for documentation purposes. This allowed for placement of a 5-French vascular sheath. A limited arteriogram was performed through the side arm of the sheath confirming appropriate access within the right common femoral artery. The 5-French C2 catheter was utilized to select the contralateral left internal iliac artery. Selective left internal iliac angiogram was performed. The tortuous left uterine artery was identified. Selective catheterization was performed of the left uterine artery with a microcatheter and micro guide wire. A selective left uterine angiogram was performed. This demonstrated patency of the left uterine artery. Mild diffuse hypervascularity of the enlarged fibroid uterus. Access was adequate for embolization. For embolization, 3 vials of 500 - 700 micron Embospheres were injected into the left uterine artery. Post embolization  angiogram confirms complete stasis of the left uterine vascular territory. Microcatheter was removed. The C2 catheter was retracted and utilized to select the right internal iliac artery. Selective right internal iliac angiogram was performed. The patent right uterine artery was identified. For selective catheterization, the micro catheter and guidewire were utilized to select the right uterine artery. Selective right uterine angiogram was performed. This demonstrated patency of the right uterine artery. Catheter position was safe for embolization. Embolization was performed to complete stasis with injection of 1 and 1/3 vials of 500-700 micron Embospheres. Post embolization angiogram confirms complete stasis of the right uterine vascular territory. At this point, all wires, catheters and sheaths were removed from the patient. Hemostasis was achieved at the right groin access site with The patient tolerated the procedure well without immediate post procedural complication. FINDINGS: Bilateral internal iliac arteriograms demonstrates conventional branching pattern with widely patent origins of the bilateral uterine arteries. Sub selective bilateral uterine arteriograms demonstrate co-dominant supply to a hypertrophied myomatous uterus. Completion arteriograms following bilateral uterine artery particle embolization demonstrates a technically excellent result with stasis of flow within the bilateral uterine vascular territories. IMPRESSION: Successful bilateral uterine artery embolization (U F E). PLAN: The patient will be seen in follow-up consultation at the interventional radiology clinic in approximately 3-4 weeks. Electronically Signed   By: Sandi Mariscal M.D.   On: 11/17/2018 13:26   Ir US Guide  Vasc Access Right  Result Date: 11/17/2018 INDICATION: Symptomatic uterine fibroids. Patient presents today for bilateral uterine artery embolization. Please refer to formal consultation in the epic EMR dated  09/14/2018 for additional details. EXAM: Uterine Fibroid Embolization MEDICATIONS: Toradol 30 mg IV; Ancef 2 gm IV. The antibiotic was administered within one hour of the procedure ANESTHESIA/SEDATION: Moderate (conscious) sedation was employed during this procedure. A total of Versed 4 mg and Fentanyl 200 mcg was administered intravenously. Moderate Sedation Time: 60 minutes. The patient's level of consciousness and vital signs were monitored continuously by radiology nursing throughout the procedure under my direct supervision. CONTRAST:  85 cc Omnipaque 300 FLUOROSCOPY TIME:  17 minutes, 54 seconds (144.3 mGy) COMPLICATIONS: None immediate. PROCEDURE: Informed consent was obtained from the patient following explanation of the procedure, risks, benefits and alternatives. The patient understands, agrees and consents for the procedure. All questions were addressed. A time out was performed prior to the initiation of the procedure. Maximal barrier sterile technique utilized including caps, mask, sterile gowns, sterile gloves, large sterile drape, hand hygiene, and Betadine prep. The right femoral head was marked fluoroscopically. Under sterile conditions and local anesthesia, the right common femoral artery access was performed with a micropuncture needle. Under direct ultrasound guidance, the right common femoral was accessed with a micropuncture kit. An ultrasound image was saved for documentation purposes. This allowed for placement of a 5-French vascular sheath. A limited arteriogram was performed through the side arm of the sheath confirming appropriate access within the right common femoral artery. The 5-French C2 catheter was utilized to select the contralateral left internal iliac artery. Selective left internal iliac angiogram was performed. The tortuous left uterine artery was identified. Selective catheterization was performed of the left uterine artery with a microcatheter and micro guide wire. A  selective left uterine angiogram was performed. This demonstrated patency of the left uterine artery. Mild diffuse hypervascularity of the enlarged fibroid uterus. Access was adequate for embolization. For embolization, 3 vials of 500 - 700 micron Embospheres were injected into the left uterine artery. Post embolization angiogram confirms complete stasis of the left uterine vascular territory. Microcatheter was removed. The C2 catheter was retracted and utilized to select the right internal iliac artery. Selective right internal iliac angiogram was performed. The patent right uterine artery was identified. For selective catheterization, the micro catheter and guidewire were utilized to select the right uterine artery. Selective right uterine angiogram was performed. This demonstrated patency of the right uterine artery. Catheter position was safe for embolization. Embolization was performed to complete stasis with injection of 1 and 1/3 vials of 500-700 micron Embospheres. Post embolization angiogram confirms complete stasis of the right uterine vascular territory. At this point, all wires, catheters and sheaths were removed from the patient. Hemostasis was achieved at the right groin access site with The patient tolerated the procedure well without immediate post procedural complication. FINDINGS: Bilateral internal iliac arteriograms demonstrates conventional branching pattern with widely patent origins of the bilateral uterine arteries. Sub selective bilateral uterine arteriograms demonstrate co-dominant supply to a hypertrophied myomatous uterus. Completion arteriograms following bilateral uterine artery particle embolization demonstrates a technically excellent result with stasis of flow within the bilateral uterine vascular territories. IMPRESSION: Successful bilateral uterine artery embolization (U F E). PLAN: The patient will be seen in follow-up consultation at the interventional radiology clinic in  approximately 3-4 weeks. Electronically Signed   By: Sandi Mariscal M.D.   On: 11/17/2018 13:26   Ir Embo Tumor Organ Ischemia Infarct Inc Guide  Roadmapping  Result Date: 11/17/2018 INDICATION: Symptomatic uterine fibroids. Patient presents today for bilateral uterine artery embolization. Please refer to formal consultation in the epic EMR dated 09/14/2018 for additional details. EXAM: Uterine Fibroid Embolization MEDICATIONS: Toradol 30 mg IV; Ancef 2 gm IV. The antibiotic was administered within one hour of the procedure ANESTHESIA/SEDATION: Moderate (conscious) sedation was employed during this procedure. A total of Versed 4 mg and Fentanyl 200 mcg was administered intravenously. Moderate Sedation Time: 60 minutes. The patient's level of consciousness and vital signs were monitored continuously by radiology nursing throughout the procedure under my direct supervision. CONTRAST:  85 cc Omnipaque 300 FLUOROSCOPY TIME:  17 minutes, 54 seconds (712.4 mGy) COMPLICATIONS: None immediate. PROCEDURE: Informed consent was obtained from the patient following explanation of the procedure, risks, benefits and alternatives. The patient understands, agrees and consents for the procedure. All questions were addressed. A time out was performed prior to the initiation of the procedure. Maximal barrier sterile technique utilized including caps, mask, sterile gowns, sterile gloves, large sterile drape, hand hygiene, and Betadine prep. The right femoral head was marked fluoroscopically. Under sterile conditions and local anesthesia, the right common femoral artery access was performed with a micropuncture needle. Under direct ultrasound guidance, the right common femoral was accessed with a micropuncture kit. An ultrasound image was saved for documentation purposes. This allowed for placement of a 5-French vascular sheath. A limited arteriogram was performed through the side arm of the sheath confirming appropriate access within  the right common femoral artery. The 5-French C2 catheter was utilized to select the contralateral left internal iliac artery. Selective left internal iliac angiogram was performed. The tortuous left uterine artery was identified. Selective catheterization was performed of the left uterine artery with a microcatheter and micro guide wire. A selective left uterine angiogram was performed. This demonstrated patency of the left uterine artery. Mild diffuse hypervascularity of the enlarged fibroid uterus. Access was adequate for embolization. For embolization, 3 vials of 500 - 700 micron Embospheres were injected into the left uterine artery. Post embolization angiogram confirms complete stasis of the left uterine vascular territory. Microcatheter was removed. The C2 catheter was retracted and utilized to select the right internal iliac artery. Selective right internal iliac angiogram was performed. The patent right uterine artery was identified. For selective catheterization, the micro catheter and guidewire were utilized to select the right uterine artery. Selective right uterine angiogram was performed. This demonstrated patency of the right uterine artery. Catheter position was safe for embolization. Embolization was performed to complete stasis with injection of 1 and 1/3 vials of 500-700 micron Embospheres. Post embolization angiogram confirms complete stasis of the right uterine vascular territory. At this point, all wires, catheters and sheaths were removed from the patient. Hemostasis was achieved at the right groin access site with The patient tolerated the procedure well without immediate post procedural complication. FINDINGS: Bilateral internal iliac arteriograms demonstrates conventional branching pattern with widely patent origins of the bilateral uterine arteries. Sub selective bilateral uterine arteriograms demonstrate co-dominant supply to a hypertrophied myomatous uterus. Completion arteriograms  following bilateral uterine artery particle embolization demonstrates a technically excellent result with stasis of flow within the bilateral uterine vascular territories. IMPRESSION: Successful bilateral uterine artery embolization (U F E). PLAN: The patient will be seen in follow-up consultation at the interventional radiology clinic in approximately 3-4 weeks. Electronically Signed   By: Sandi Mariscal M.D.   On: 11/17/2018 13:26    Labs:  CBC: Recent Labs    11/17/18 0758  WBC 6.8  HGB 12.9  HCT 41.4  PLT 319    COAGS: Recent Labs    11/17/18 0758  INR 0.96    BMP: Recent Labs    04/15/18 1350 07/20/18 0827 09/19/18 0927 10/05/18 0902 11/17/18 0758  NA 140 137  --  139 139  K 3.5 4.3  --  4.3 3.9  CL 106 103  --  104 110  CO2 26 27  --  28 21*  GLUCOSE 74 102*  --  129* 146*  BUN 13 11  --  12 13  CALCIUM 9.1 9.2  --  8.9 9.3  CREATININE 0.70 0.78 0.70 0.78 0.79  GFRNONAA  --   --   --   --  >60  GFRAA  --   --   --   --  >60    LIVER FUNCTION TESTS: Recent Labs    04/15/18 1350 07/20/18 0827  BILITOT 1.0 1.2  AST 16 17  ALT 13 15  ALKPHOS 67 64  PROT 6.8 6.9  ALBUMIN 3.9 4.0    TUMOR MARKERS: No results for input(s): AFPTM, CEA, CA199, CHROMGRNA in the last 8760 hours.  Assessment:  Symptomatic uterine fibroids  S/P bilateral uterine artery embolization on 11/17/18 by Dr. Pascal Lux.  Patient has had a good result and is pleased.  She should return in 6 months for follow up (no imaging needed per Pascal Lux).  Electronically Signed: Murrell Redden PA-C 12/13/2018, 2:36 PM    Please refer to Dr. Deniece Portela attestation of this note for management and plan.

## 2019-01-02 ENCOUNTER — Ambulatory Visit: Payer: Self-pay | Admitting: Dietician

## 2019-02-20 ENCOUNTER — Other Ambulatory Visit: Payer: Self-pay | Admitting: Endocrinology

## 2019-04-10 ENCOUNTER — Other Ambulatory Visit: Payer: Self-pay

## 2019-04-10 ENCOUNTER — Other Ambulatory Visit (INDEPENDENT_AMBULATORY_CARE_PROVIDER_SITE_OTHER): Payer: BC Managed Care – PPO

## 2019-04-10 DIAGNOSIS — Z794 Long term (current) use of insulin: Secondary | ICD-10-CM | POA: Diagnosis not present

## 2019-04-10 DIAGNOSIS — E1165 Type 2 diabetes mellitus with hyperglycemia: Secondary | ICD-10-CM

## 2019-04-10 LAB — BASIC METABOLIC PANEL
BUN: 15 mg/dL (ref 6–23)
CO2: 27 mEq/L (ref 19–32)
Calcium: 9.3 mg/dL (ref 8.4–10.5)
Chloride: 98 mEq/L (ref 96–112)
Creatinine, Ser: 0.87 mg/dL (ref 0.40–1.20)
GFR: 85.27 mL/min (ref >60.00)
Glucose, Bld: 184 mg/dL — ABNORMAL HIGH (ref 70–99)
Potassium: 3.9 mEq/L (ref 3.5–5.1)
Sodium: 135 mEq/L (ref 135–145)

## 2019-04-10 LAB — HEMOGLOBIN A1C: Hgb A1c MFr Bld: 7.4 % — ABNORMAL HIGH (ref 4.6–6.5)

## 2019-04-12 ENCOUNTER — Encounter: Payer: BC Managed Care – PPO | Attending: Endocrinology | Admitting: Nutrition

## 2019-04-13 ENCOUNTER — Encounter: Payer: Self-pay | Admitting: Endocrinology

## 2019-04-13 ENCOUNTER — Other Ambulatory Visit: Payer: Self-pay

## 2019-04-13 ENCOUNTER — Ambulatory Visit (INDEPENDENT_AMBULATORY_CARE_PROVIDER_SITE_OTHER): Payer: BC Managed Care – PPO | Admitting: Endocrinology

## 2019-04-13 ENCOUNTER — Ambulatory Visit: Payer: Self-pay | Admitting: Endocrinology

## 2019-04-13 DIAGNOSIS — E1165 Type 2 diabetes mellitus with hyperglycemia: Secondary | ICD-10-CM

## 2019-04-13 DIAGNOSIS — Z794 Long term (current) use of insulin: Secondary | ICD-10-CM

## 2019-04-13 DIAGNOSIS — E7800 Pure hypercholesterolemia, unspecified: Secondary | ICD-10-CM

## 2019-04-13 NOTE — Progress Notes (Signed)
[name], female   DOB: 10/11/1976, 43 y.o.   MRN: [id number]    Today's office visit was provided via telemedicine using video technique Explained to the patient and the the limitations of evaluation and management by telemedicine and the availability of in person appointments.  The patient understood the limitations and agreed to proceed. Patient also understood that the telehealth visit is billable. . Location of the patient: Home . Location of the provider: Office Only the patient and myself were participating in the encounter    Reason for Appointment: Insulin pump follow-up   History of Present Illness   Diagnosis: date of diagnosis: 2009, initially asymptomatic, has type 2 diabetes.   RECENT history:     A1c is 7.4, previous range 7.1-7.7  Current treatment regimen:   OMNIPOD insulin pump Basal rates: 0.3 until 5 AM, 5 AM-12 PM = 0.35, 12 PM-6 PM = 0.25, 6 PM-12 AM = 0.35  I/C 12, ISF: 60, Target: 110, with correction over 120. Current basal 29 % of total insulin Total insulin dose recently 21 units/day   Non-insulin hypoglycemic drugs: Farxiga 5 mg daily  Blood sugar patterns, management:  She has no consistent blood sugar patterns are discussed below  Also see description of mealtime coverage below  However still having difficulty consistently covering her meals with tendency to some hypoglycemic episodes as discussed below after meals  Hypoglycemia has been minimal  She thinks that she is dealing with more stress and snacking more  Also not exercising much  She thinks her weight is about the same  Generally using her freestyle libre consistently monitor her blood sugars  Side effects from medications: Diarrhea from metformin and abdominal discomfort  Glucometer: Contour/freestyle libre   CONTINUOUS GLUCOSE MONITORING RECORD INTERPRETATION    Dates of Recording: 5/11 through 04/12/2019  Sensor description: [employer] She is finding this to be lower than actual blood sugar between 5-15 mg compared to fingerstick  Results statistics:   CGM use % of time  88  Average and SD  132+/-33  Time in range    83    %  % Time Above 180  12  % Time above 250  2  % Time Below target  3    Glycemic patterns summary: Blood sugars are generally at stable throughout the day with tendency to go above target mostly in the early afternoon and occasionally before and after dinnertime HIGHEST blood sugars are between 12-2 PM Lowest blood sugars between 4-6 AM overall  Hyperglycemic episodes are mostly postprandial as discussed below and only rarely at night  Hypoglycemic episodes occurred once at about 1 AM which appeared to be related to correcting a postprandial high readings of about 180 which was not rising Also has a couple of readings in the 60s last week around 4-5 PM, asymptomatic  Overnight periods: Blood sugars are variable in the early part of the night including one episode of hypoglycemia but an average between about 118 and 127.  Usually no significant hyperglycemia  Preprandial periods: Sugars are averaging about 120 at breakfast, 131 at lunch and 150 at dinner  Postprandial periods: After breakfast:   Blood sugars are generally well controlled without excessive  After lunch: She does have occasional spikes in her blood sugar with readings up to about 254 This is usually related to underestimating her carbohydrates are not taking enough insulin to compensate for higher fat meal She did try an extended bolus for higher  fat past the meal the other day but blood sugar was increasing after her 2-hour extension of the bolus and went up to 250 However AVERAGE blood sugar after lunch is 156  After dinner: Average blood sugar 153 Has only occasional significant rise in blood sugar some of this may be related to snacks before dinner. Some high readings may be related to rare missed boluses possibly taking  bolus late   CGM use % of time  50  2-week average/SD  129+/-54  Time in range      78 %  % Time Above 180  13  % Time above 250   % Time Below 70 9     PRE-MEAL Fasting Lunch Dinner Bedtime Overall  Glucose range:       Averages:  117   114   129   POST-MEAL PC Breakfast PC Lunch PC Dinner  Glucose range:     Averages:  136  129  178     Meals: 3 meals per day, vegetarian, tries to get protein like nuts, [name] yogurt, soy, fish  Dinner usually at 7-8 pm.Cheese sandwiich, or vegetable soup with cheese, may have 1-2 starches per meal Sometimes will have  peanut butter sandwich for lunch  Physical activity: exercise is minimal, mostly walking stairs, upto 6,000 steps per day  Dietician visit: Most recent: 03/2013  CDE visit: 10/15    Wt Readings from Last 3 Encounters:  12/13/18 142 lb (64.4 kg)  12/13/18 142 lb 3.2 oz (64.5 kg)  11/17/18 142 lb (64.4 kg)    LABS:  Lab Results  Component Value Date   HGBA1C 7.4 (H) 04/10/2019   HGBA1C 7.1 (H) 10/05/2018   HGBA1C 7.7 (H) 07/20/2018   Lab Results  Component Value Date   MICROALBUR <0.7 07/20/2018   LDLCALC 94 07/20/2018   CREATININE 0.87 04/10/2019      Lab on 04/10/2019  Component Date Value Ref Range Status  . Sodium 04/10/2019 135  135 - 145 mEq/L Final  . Potassium 04/10/2019 3.9  3.5 - 5.1 mEq/L Final  . Chloride 04/10/2019 98  96 - 112 mEq/L Final  . CO2 04/10/2019 27  19 - 32 mEq/L Final  . Glucose, Bld 04/10/2019 184* 70 - 99 mg/dL Final  . BUN 04/10/2019 15  6 - 23 mg/dL Final  . Creatinine, Ser 04/10/2019 0.87  0.40 - 1.20 mg/dL Final  . Calcium 04/10/2019 9.3  8.4 - 10.5 mg/dL Final  . GFR 04/10/2019 85.27  >60.00 mL/min Final  . Hgb A1c MFr Bld 04/10/2019 7.4* 4.6 - 6.5 % Final   Glycemic Control Guidelines for People with Diabetes:Non Diabetic:  <6%Goal of Therapy: <7%Additional Action Suggested:  >8%     Allergies as of 04/13/2019      Reactions   Other Anaphylaxis   Peaches , tomatoes,  strawberry   Strawberry Extract Hives   Facial hives and mouth hives   Tomato Hives   Facial hives and mouth hives      Medication List       Accurate as of Apr 13, 2019  9:44 AM. If you have any questions, ask your nurse or doctor.        blood glucose meter kit and supplies Kit Use as instructed to test blood sugar. DX: E11.09   DULoxetine 60 MG capsule Commonly known as:  CYMBALTA Take 60 mg by mouth daily.   Farxiga 5 MG Tabs tablet Generic drug:  dapagliflozin propanediol TAKE 1 TABLET BY  MOUTH EVERY DAY **REPLACES JARDIANCE**   FreeStyle Libre 14 Day Sensor Misc 1 Units by Does not apply route every 14 (fourteen) days.   FUSION PLUS PO Take 1 capsule by mouth daily. Takes for anemia   glucose blood test strip Commonly known as:  Contour Next Test Use as instructed use to test blood sugar before and after each meal   insulin aspart 100 UNIT/ML injection Commonly known as:  NovoLOG Inject 45 Units into the skin daily.   insulin pump Soln Inject 1 each into the skin See admin instructions. Novolog 100 units/ml per insulin pump   multivitamin with minerals Tabs tablet Take 1 tablet by mouth daily.   Norethin Ace-Eth Estrad-FE 1-20 MG-MCG(24) Chew Chew 1 tablet by mouth daily.       Allergies:  Allergies  Allergen Reactions  . Other Anaphylaxis    Peaches , tomatoes, strawberry  . Strawberry Extract Hives    Facial hives and mouth hives  . Tomato Hives    Facial hives and mouth hives    Past Medical History:  Diagnosis Date  . Back pain   . Diabetes mellitus   . Fibroids   . PCOS (polycystic ovarian syndrome)     Past Surgical History:  Procedure Laterality Date  . BREAST EXCISIONAL BIOPSY Right   . BREAST SURGERY     benign  . IR ANGIOGRAM PELVIS SELECTIVE OR SUPRASELECTIVE  11/17/2018  . IR ANGIOGRAM PELVIS SELECTIVE OR SUPRASELECTIVE  11/17/2018  . IR ANGIOGRAM SELECTIVE EACH ADDITIONAL VESSEL  11/17/2018  . IR ANGIOGRAM SELECTIVE EACH  ADDITIONAL VESSEL  11/17/2018  . IR EMBO TUMOR ORGAN ISCHEMIA INFARCT INC GUIDE ROADMAPPING  11/17/2018  . IR RADIOLOGIST EVAL & MGMT  09/14/2018  . IR RADIOLOGIST EVAL & MGMT  12/13/2018  . IR US GUIDE VASC ACCESS RIGHT  11/17/2018  . UTERINE FIBROID SURGERY      Family History  Problem Relation Age of Onset  . Hyperlipidemia Other   . Hypertension Other   . Stroke Other   . Cancer Other   . Diabetes Maternal Uncle   . Heart disease Paternal Uncle   . Diabetes Maternal Grandmother   . Heart disease Maternal Grandmother   . Breast cancer Maternal Grandmother        diagnosed in her 22's  . Diabetes Paternal Grandmother     Social History:  reports that she has never smoked. She has never used smokeless tobacco. She reports that she does not drink alcohol or use drugs.  Review of Systems -  Has high LDL at times, last labs show improved results    Lab Results  Component Value Date   CHOL 187 07/20/2018   HDL 84.80 07/20/2018   LDLCALC 94 07/20/2018   TRIG 39.0 07/20/2018   CHOLHDL 2 07/20/2018       Examination:   There were no vitals taken for this visit.  There is no height or weight on file to calculate BMI.    Assesment:   DIABETES type 2:  See history of present illness for detailed discussion of  current management, blood sugar patterns and problems identified  Her last A1c was 7.1 and is now 7.4  She is on insulin pump and Farxiga  Currently her total insulin requirement is about 20 units as before  As discussed above most of her difficulties with control of her sugars are related to mealtime bolusing and variable diet Blood sugar patterns, abnormal glucose readings and causes of hypoglycemia  discussed Also discussed hypoglycemia which has occurred only occasionally from over correcting postprandial sugars She thinks she can do better with managing her diet, bolusing more accurately and also cutting back on stress eating She can do better with  exercise also which she has done very regularly and minimally She is benefiting from using the freestyle libre but this may be falsely low at times  Most likely her blood sugars over the last 2 weeks are better since glucose management indicator is 6.5 compared to her A1c    Recommendations today: No insulin changes Remember to bolus before eating To add more insulin for higher fat meals and extend for up to 3 hours Measure carbohydrate amounts appropriately and bolus for all snacks and additional food intake Consider temporary basal when exercising To call if she is having any tendency to hypoglycemia  Check lipids again on the next visit  Counseling time on subjects discussed in assessment and plan sections is over 50% of today's 25 minute visit   There are no Patient Instructions on file for this visit.   Counseling time on subjects discussed in assessment and plan sections is over 50% of today's 25 minute visit   Elayne Snare 04/13/2019, 9:44 AM   Note: This office note was prepared with Dragon voice recognition system technology. Any transcriptional errors that result from this process are unintentional.

## 2019-04-28 ENCOUNTER — Other Ambulatory Visit: Payer: Self-pay | Admitting: Endocrinology

## 2019-05-02 ENCOUNTER — Other Ambulatory Visit: Payer: Self-pay | Admitting: Obstetrics and Gynecology

## 2019-05-02 DIAGNOSIS — Z1231 Encounter for screening mammogram for malignant neoplasm of breast: Secondary | ICD-10-CM

## 2019-05-09 ENCOUNTER — Other Ambulatory Visit: Payer: Self-pay

## 2019-05-09 ENCOUNTER — Ambulatory Visit
Admission: RE | Admit: 2019-05-09 | Discharge: 2019-05-09 | Disposition: A | Discharge status: Home / Self Care | Payer: BC Managed Care – PPO | Source: Ambulatory Visit | Attending: Obstetrics and Gynecology | Admitting: Obstetrics and Gynecology

## 2019-05-09 DIAGNOSIS — Z1231 Encounter for screening mammogram for malignant neoplasm of breast: Secondary | ICD-10-CM

## 2019-05-25 ENCOUNTER — Other Ambulatory Visit: Payer: Self-pay

## 2019-05-25 MED ORDER — GLUCOSE BLOOD VI STRP: ORAL_STRIP | 3 refills | Status: DC

## 2019-05-25 MED ORDER — ONETOUCH VERIO FLEX SYSTEM W/DEVICE KIT: PACK | 0 refills | Status: DC

## 2019-05-25 MED ORDER — ONETOUCH DELICA PLUS LANCET30G MISC: 1.0000 | 3 refills | Status: DC

## 2019-06-05 ENCOUNTER — Other Ambulatory Visit: Payer: Self-pay

## 2019-06-05 ENCOUNTER — Other Ambulatory Visit (INDEPENDENT_AMBULATORY_CARE_PROVIDER_SITE_OTHER): Payer: BC Managed Care – PPO

## 2019-06-05 DIAGNOSIS — Z794 Long term (current) use of insulin: Secondary | ICD-10-CM

## 2019-06-05 DIAGNOSIS — E7800 Pure hypercholesterolemia, unspecified: Secondary | ICD-10-CM

## 2019-06-05 DIAGNOSIS — E1165 Type 2 diabetes mellitus with hyperglycemia: Secondary | ICD-10-CM

## 2019-06-05 LAB — LIPID PANEL
Cholesterol: 147 mg/dL (ref 0–200)
HDL: 71.8 mg/dL (ref >39.00)
LDL Cholesterol: 64 mg/dL (ref 0–99)
NonHDL: 75.44
Total CHOL/HDL Ratio: 2
Triglycerides: 55 mg/dL (ref 0.0–149.0)
VLDL: 11 mg/dL (ref 0.0–40.0)

## 2019-06-05 LAB — MICROALBUMIN / CREATININE URINE RATIO
Creatinine,U: 152 mg/dL
Microalb Creat Ratio: 0.5 mg/g (ref 0.0–30.0)
Microalb, Ur: <0.7 mg/dL (ref 0.0–1.9)

## 2019-06-05 LAB — BASIC METABOLIC PANEL
BUN: 14 mg/dL (ref 6–23)
CO2: 26 mEq/L (ref 19–32)
Calcium: 8.8 mg/dL (ref 8.4–10.5)
Chloride: 102 mEq/L (ref 96–112)
Creatinine, Ser: 0.81 mg/dL (ref 0.40–1.20)
GFR: 92.53 mL/min (ref >60.00)
Glucose, Bld: 106 mg/dL — ABNORMAL HIGH (ref 70–99)
Potassium: 4.2 mEq/L (ref 3.5–5.1)
Sodium: 136 mEq/L (ref 135–145)

## 2019-06-05 LAB — HEMOGLOBIN A1C: Hgb A1c MFr Bld: 6.9 % — ABNORMAL HIGH (ref 4.6–6.5)

## 2019-06-07 ENCOUNTER — Other Ambulatory Visit: Payer: Self-pay

## 2019-06-07 ENCOUNTER — Encounter: Payer: Self-pay | Admitting: Endocrinology

## 2019-06-07 ENCOUNTER — Ambulatory Visit (INDEPENDENT_AMBULATORY_CARE_PROVIDER_SITE_OTHER): Payer: BC Managed Care – PPO | Admitting: Endocrinology

## 2019-06-07 DIAGNOSIS — Z794 Long term (current) use of insulin: Secondary | ICD-10-CM

## 2019-06-07 DIAGNOSIS — E1165 Type 2 diabetes mellitus with hyperglycemia: Secondary | ICD-10-CM | POA: Diagnosis not present

## 2019-06-07 DIAGNOSIS — E7800 Pure hypercholesterolemia, unspecified: Secondary | ICD-10-CM | POA: Diagnosis not present

## 2019-06-07 NOTE — Progress Notes (Signed)
[name], female   DOB: [birth year], 43 y.o.   MRN: [id number]    Today's office visit was provided via telemedicine using video technique Explained to the patient and the the limitations of evaluation and management by telemedicine and the availability of in person appointments.  The patient understood the limitations and agreed to proceed. Patient also understood that the telehealth visit is billable. . Location of the patient: Home . Location of the provider: Office Only the patient and myself were participating in the encounter    Reason for Appointment: Insulin pump follow-up   History of Present Illness   Diagnosis: date of diagnosis: 2009, initially asymptomatic, has type 2 diabetes.   RECENT history:     A1c is 6.9 compared to 7.4, previous range 7.1-7.7  Current treatment regimen:   OMNIPOD insulin pump Basal rates: 0. 45 until 5 AM, 5 AM- 6 PM = 0. 35, 6 PM-12 AM = 0. 45  I/C 12, ISF: 60, Target: 110, with correction over 110.  Total insulin dose recently 21 units/day   Non-insulin hypoglycemic drugs: Farxiga 5 mg daily  Blood sugar patterns, management:  She has stopped using the freestyle libre about 3 weeks ago because of falsely low readings, she had an average blood sugar of only 114 during the monitoring period in June  However she has started checking her sugars at least 5 times a day with her fingerstick  On her own she appears to have adjusted her basal rate with increased basal rate during the night and late evening  Previously her sensor indicated relatively low readings overnight but these are probably falsely low and she does not feel any hypoglycemia during the night now  She does have some variability in her morning readings before breakfast and also before dinnertime but otherwise blood sugars are excellent compared to previous readings  She does have some variability in her blood sugars in the evenings as before  related to her meal intake and covering adequately for meals and snacks  She is currently working from home and is not exercising much  Only a couple of times has had blood sugars in the 60s after supper with overestimating her carbohydrate especially for salads  Side effects from medications: Diarrhea from metformin and abdominal discomfort  Glucometer: Contour  78% of her readings are within target, 19% above 180 and 3% below 70   PRE-MEAL Fasting Lunch Dinner Bedtime Overall  Glucose range:  113-200  81-125  86-206    Mean/median:  140   158   134+/-41   POST-MEAL PC Breakfast PC Lunch PC Dinner  Glucose range:    57-158  Mean/median:    129    PREVIOUS data:   CGM use % of time  88  Average and SD  132+/-33  Time in range    83    %  % Time Above 180  12  % Time above 250  2  % Time Below target  3    Meals: 3 meals per day, vegetarian, tries to get protein like nuts, [name] yogurt, soy, fish  Dinner usually at 7-8 pm.Cheese sandwiich, or vegetable soup with cheese, may have 1-2 starches per meal Sometimes will have  peanut butter sandwich for lunch  Physical activity: exercise is minimal, mostly walking stairs, upto 6,000 steps per day  Dietician visit: Most recent: 03/2013  CDE visit: 10/15    Wt Readings from Last 3 Encounters:  12/13/18 142 lb (64.4  kg)  12/13/18 142 lb 3.2 oz (64.5 kg)  11/17/18 142 lb (64.4 kg)    LABS:  Lab Results  Component Value Date   HGBA1C 6.9 (H) 06/05/2019   HGBA1C 7.4 (H) 04/10/2019   HGBA1C 7.1 (H) 10/05/2018   Lab Results  Component Value Date   MICROALBUR <0.7 06/05/2019   LDLCALC 64 06/05/2019   CREATININE 0.81 06/05/2019      Lab on 06/05/2019  Component Date Value Ref Range Status  . Microalb, Ur 06/05/2019 <0.7  0.0 - 1.9 mg/dL Final  . Creatinine,U 06/05/2019 152.0  mg/dL Final  . Microalb Creat Ratio 06/05/2019 0.5  0.0 - 30.0 mg/g Final  . Cholesterol 06/05/2019 147  0 - 200 mg/dL Final   ATP III  Classification       Desirable:  < 200 mg/dL               Borderline High:  200 - 239 mg/dL          High:  > = 240 mg/dL  . Triglycerides 06/05/2019 55.0  0.0 - 149.0 mg/dL Final   Normal:  <150 mg/dLBorderline High:  150 - 199 mg/dL  . HDL 06/05/2019 71.80  >39.00 mg/dL Final  . VLDL 06/05/2019 11.0  0.0 - 40.0 mg/dL Final  . LDL Cholesterol 06/05/2019 64  0 - 99 mg/dL Final  . Total CHOL/HDL Ratio 06/05/2019 2   Final                  Men          Women1/2 Average Risk     3.4          3.3Average Risk          5.0          4.42X Average Risk          9.6          7.13X Average Risk          15.0          11.0                      . NonHDL 06/05/2019 75.44   Final   NOTE:  Non-HDL goal should be 30 mg/dL higher than patient's LDL goal (i.e. LDL goal of < 70 mg/dL, would have non-HDL goal of < 100 mg/dL)  . Sodium 06/05/2019 136  135 - 145 mEq/L Final  . Potassium 06/05/2019 4.2  3.5 - 5.1 mEq/L Final  . Chloride 06/05/2019 102  96 - 112 mEq/L Final  . CO2 06/05/2019 26  19 - 32 mEq/L Final  . Glucose, Bld 06/05/2019 106* 70 - 99 mg/dL Final  . BUN 06/05/2019 14  6 - 23 mg/dL Final  . Creatinine, Ser 06/05/2019 0.81  0.40 - 1.20 mg/dL Final  . Calcium 06/05/2019 8.8  8.4 - 10.5 mg/dL Final  . GFR 06/05/2019 92.53  >60.00 mL/min Final  . Hgb A1c MFr Bld 06/05/2019 6.9* 4.6 - 6.5 % Final   Glycemic Control Guidelines for People with Diabetes:Non Diabetic:  <6%Goal of Therapy: <7%Additional Action Suggested:  >8%     Allergies as of 06/07/2019      Reactions   Other Anaphylaxis   Peaches , tomatoes, strawberry   Strawberry Extract Hives   Facial hives and mouth hives   Tomato Hives   Facial hives and mouth hives      Medication List  Accurate as of June 07, 2019 10:10 AM. If you have any questions, ask your nurse or doctor.        DULoxetine 60 MG capsule Commonly known as: CYMBALTA Take 60 mg by mouth daily.   Farxiga 5 MG Tabs tablet Generic drug: dapagliflozin  propanediol TAKE 1 TABLET BY MOUTH EVERY DAY **REPLACES JARDIANCE**   FUSION PLUS PO Take 1 capsule by mouth daily. Takes for anemia   glucose blood test strip Use Onetouch Verio Flex as instructed to check blood sugar before and after each meal.   insulin aspart 100 UNIT/ML injection Commonly known as: NovoLOG Inject 45 Units into the skin daily.   insulin pump Soln Inject 1 each into the skin See admin instructions. Novolog 100 units/ml per insulin pump   multivitamin with minerals Tabs tablet Take 1 tablet by mouth daily.   Norethin Ace-Eth Estrad-FE 1-20 MG-MCG(24) Chew Chew 1 tablet by mouth daily.   OneTouch Delica Plus [id number] Misc 1 each by Does not apply route See admin instructions. Use Onetouch Delica Plus lancets to check blood sugar before and after each meal.   OneTouch Verio Flex System w/Device Kit Use Onetouch verio flex meter to check blood sugar before and after each meal.       Allergies:  Allergies  Allergen Reactions  . Other Anaphylaxis    Peaches , tomatoes, strawberry  . Strawberry Extract Hives    Facial hives and mouth hives  . Tomato Hives    Facial hives and mouth hives    Past Medical History:  Diagnosis Date  . Back pain   . Diabetes mellitus   . Fibroids   . PCOS (polycystic ovarian syndrome)     Past Surgical History:  Procedure Laterality Date  . BREAST EXCISIONAL BIOPSY Right   . BREAST SURGERY     benign  . IR ANGIOGRAM PELVIS SELECTIVE OR SUPRASELECTIVE  11/17/2018  . IR ANGIOGRAM PELVIS SELECTIVE OR SUPRASELECTIVE  11/17/2018  . IR ANGIOGRAM SELECTIVE EACH ADDITIONAL VESSEL  11/17/2018  . IR ANGIOGRAM SELECTIVE EACH ADDITIONAL VESSEL  11/17/2018  . IR EMBO TUMOR ORGAN ISCHEMIA INFARCT INC GUIDE ROADMAPPING  11/17/2018  . IR RADIOLOGIST EVAL & MGMT  09/14/2018  . IR RADIOLOGIST EVAL & MGMT  12/13/2018  . IR US GUIDE VASC ACCESS RIGHT  11/17/2018  . UTERINE FIBROID SURGERY      Family History  Problem Relation Age  of Onset  . Hyperlipidemia Other   . Hypertension Other   . Stroke Other   . Cancer Other   . Diabetes Maternal Uncle   . Heart disease Paternal Uncle   . Diabetes Maternal Grandmother   . Heart disease Maternal Grandmother   . Breast cancer Maternal Grandmother        diagnosed in her 54's  . Diabetes Paternal Grandmother     Social History:  reports that she has never smoked. She has never used smokeless tobacco. She reports that she does not drink alcohol or use drugs.  Review of Systems -  Has high LDL at times, although her LDL was only 100 PCP has started her on Lipitor 10 mg Recent labs show improved results    Lab Results  Component Value Date   CHOL 147 06/05/2019   HDL 71.80 06/05/2019   LDLCALC 64 06/05/2019   TRIG 55.0 06/05/2019   CHOLHDL 2 06/05/2019       Examination:   There were no vitals taken for this visit.  There is no  height or weight on file to calculate BMI.    Assesment:   DIABETES type 2:  See history of present illness for detailed discussion of  current management, blood sugar patterns and problems identified  Her A1c has come down to 6.9 finally  She is on the Omni pod insulin pump and [place]  Currently she is not using the freestyle libre  She has done fairly well recently and her new basal rate settings appear to be working fairly well With somewhat higher basal rates she may be needing a little less bolus insulin especially for correction now  She does have some variability in her morning sugars but these are not consistently high Also as before has some variability in her postprandial readings in the afternoon based on her coverage for meals and snacks Blood sugars are however generally better after evening meal Currently using the same carbohydrate ratio 1: 12 for all her meals  Recommendations today: As before recommended that she have protein with every meal She can try to add extra for higher fat meals but also extend  the bolus If she continues to have low sugars after dinner she can change her carbohydrate ratio but she will let us know She will try to check on Dexcom and Medtronic guardian sensors to use instead of freestyle libre; discussed how these are different from the freestyle libre and the differences between the 2 No change in basal rate settings at present but will need to evaluate her overnight basal rate when she has the sensor  Encourage her to exercise regularly  LIPIDS: She will continue to follow with PCP, labs to be forwarded  Counseling time on subjects discussed in assessment and plan sections is over 50% of today's 25 minute visit   There are no Patient Instructions on file for this visit.      Elayne Snare 06/07/2019, 10:10 AM   Note: This office note was prepared with Dragon voice recognition system technology. Any transcriptional errors that result from this process are unintentional.

## 2019-06-21 ENCOUNTER — Other Ambulatory Visit: Payer: Self-pay | Admitting: Interventional Radiology

## 2019-06-21 DIAGNOSIS — D250 Submucous leiomyoma of uterus: Secondary | ICD-10-CM

## 2019-06-28 ENCOUNTER — Encounter: Payer: Self-pay | Admitting: *Deleted

## 2019-06-28 ENCOUNTER — Ambulatory Visit
Admission: RE | Admit: 2019-06-28 | Discharge: 2019-06-28 | Disposition: A | Discharge status: Home / Self Care | Payer: BC Managed Care – PPO | Source: Ambulatory Visit | Attending: Interventional Radiology | Admitting: Interventional Radiology

## 2019-06-28 ENCOUNTER — Other Ambulatory Visit: Payer: Self-pay

## 2019-06-28 DIAGNOSIS — D250 Submucous leiomyoma of uterus: Secondary | ICD-10-CM

## 2019-06-28 HISTORY — PX: IR RADIOLOGIST EVAL & MGMT: IMG5224

## 2019-06-28 NOTE — Progress Notes (Signed)
[name], female   DOB: 1976/05/29, 43 y.o.   MRN: [id number]         Chief Complaint: Post uterine fibroid embolization.  Referring Physician(s): Christophe Louis  History of Present Illness:  Mia Wilson is a 43 y.o. (G1, P0 - patient has 4 adopted sons) female with past medical history significant for type 2 diabetes and polycystic ovarian syndrome who is evaluated today via telemedicine following uterine fibroid embolization performed 11/17/2018.  In review, patient's main fibroid complaint is in regards to long history of menorrhagia dating back to her teens.  Prior to the procedure, patient stated her cycle was regular occurring every 26 - 28 days lasting approximately 6 - 7 days, 5 days of which are associated with marked menorrhagia including the passage of clots and necessitating the changing of pads and tampons every 1 to 1-1/2 hours.  Patient had been previously diagnosed with anemia though has not received a blood transfusion and was on multivitamin with iron supplementation.  Prior to the procedure, the patient also complained of bulk symptoms including constipation, abdominal/pelvic pressure and urinary frequency and urgency.  Pelvic MRI performed 09/20/2019 demonstrated multiple enhancing uterine fibroids and for this patient underwent asuccessful uterine fibroid embolization performed 11/17/2018.  Patient is very pleased with the outcome of the procedure.    She states that her cycle has remained regular lasting approximately 6 to 7 days though is associated with significant reduction in her menorrhagia.  She now states she only has to change her pad and a tampon once or twice per day.  Patient also states improvement in her bulk symptoms including decreased pelvic pain, urinary frequency and urgency.    Past Medical History:  Diagnosis Date  . Back pain   . Diabetes mellitus   . Fibroids   . PCOS (polycystic ovarian syndrome)     Past Surgical  History:  Procedure Laterality Date  . BREAST EXCISIONAL BIOPSY Right   . BREAST SURGERY     benign  . IR ANGIOGRAM PELVIS SELECTIVE OR SUPRASELECTIVE  11/17/2018  . IR ANGIOGRAM PELVIS SELECTIVE OR SUPRASELECTIVE  11/17/2018  . IR ANGIOGRAM SELECTIVE EACH ADDITIONAL VESSEL  11/17/2018  . IR ANGIOGRAM SELECTIVE EACH ADDITIONAL VESSEL  11/17/2018  . IR EMBO TUMOR ORGAN ISCHEMIA INFARCT INC GUIDE ROADMAPPING  11/17/2018  . IR RADIOLOGIST EVAL & MGMT  09/14/2018  . IR RADIOLOGIST EVAL & MGMT  12/13/2018  . IR US GUIDE VASC ACCESS RIGHT  11/17/2018  . UTERINE FIBROID SURGERY      Allergies: Other, Strawberry extract, and Tomato  Medications: Prior to Admission medications   Medication Sig Start Date End Date Taking? Authorizing Provider  Blood Glucose Monitoring Suppl (ONETOUCH VERIO FLEX SYSTEM) w/Device KIT Use Onetouch verio flex meter to check blood sugar before and after each meal. 05/25/19   Elayne Snare, MD  DULoxetine (CYMBALTA) 60 MG capsule Take 60 mg by mouth daily.    [provider]  FARXIGA 5 MG TABS tablet TAKE 1 TABLET BY MOUTH EVERY DAY **REPLACES JARDIANCE** 02/20/19   Elayne Snare, MD  glucose blood test strip Use Onetouch Verio Flex as instructed to check blood sugar before and after each meal. 05/25/19   Elayne Snare, MD  insulin aspart (NOVOLOG) 100 UNIT/ML injection Inject 45 Units into the skin daily. 10/12/18   Elayne Snare, MD  Insulin Human (INSULIN PUMP) SOLN Inject 1 each into the skin See admin instructions. Novolog 100 units/ml per insulin pump    [provider]  Iron-FA-B Cmp-C-Biot-Probiotic (FUSION PLUS PO) Take 1 capsule by mouth daily. Takes for anemia    [provider]  Lancets [facility] DELICA PLUS [id number]) [place] 1 each by Does not apply route See admin instructions. Use Onetouch Delica Plus lancets to check blood sugar before and after each meal. 05/25/19   Elayne Snare, MD  Multiple Vitamin (MULTIVITAMIN WITH MINERALS) TABS Take 1  tablet by mouth daily.    [provider]  Norethin Ace-Eth Estrad-FE 1-20 MG-MCG(24) CHEW Chew 1 tablet by mouth daily.  09/03/18   [provider]     Family History  Problem Relation Age of Onset  . Hyperlipidemia Other   . Hypertension Other   . Stroke Other   . Cancer Other   . Diabetes Maternal Uncle   . Heart disease Paternal Uncle   . Diabetes Maternal Grandmother   . Heart disease Maternal Grandmother   . Breast cancer Maternal Grandmother        diagnosed in her 48's  . Diabetes Paternal Grandmother     Social History   Socioeconomic History  . Marital status: Married    Spouse name: Not on file  . Number of children: Not on file  . Years of education: Not on file  . Highest education level: Not on file  Occupational History  . Not on file  Social Needs  . Financial resource strain: Not on file  . Food insecurity    Worry: Not on file    Inability: Not on file  . Transportation needs    Medical: Not on file    Non-medical: Not on file  Tobacco Use  . Smoking status: Never Smoker  . Smokeless tobacco: Never Used  Substance and Sexual Activity  . Alcohol use: No  . Drug use: No  . Sexual activity: Yes  Lifestyle  . Physical activity    Days per week: Not on file    Minutes per session: Not on file  . Stress: Not on file  Relationships  . Social Herbalist on phone: Not on file    Gets together: Not on file    Attends religious service: Not on file    Active member of club or organization: Not on file    Attends meetings of clubs or organizations: Not on file    Relationship status: Not on file  Other Topics Concern  . Not on file  Social History Narrative   Patient moved here from [place] August of 2012. She is not employed at this time. She has been her 2 youngest adopted children at home. Not involved a lot else here at this time. She has 2 other children also. She walks a little bit but does not do a lot since she has  left her with fatigue.    ECOG Status: 0 - Asymptomatic  Review of Systems  Constitutional: Negative for activity change and fatigue.  Genitourinary: Negative for menstrual problem, pelvic pain and urgency.  Neurological: Syncope:      Review of Systems: A 12 point ROS discussed and pertinent positives are indicated in the HPI above.  All other systems are negative.  Physical Exam No direct physical exam was performed (except for noted visual exam findings with Video Visits).   Vital Signs: There were no vitals taken for this visit.  Imaging: No results found.  Labs:  CBC: Recent Labs    11/17/18 0758  WBC 6.8  HGB 12.9  HCT 41.4  PLT 319    COAGS: Recent Labs    11/17/18 0758  INR 0.96    BMP: Recent Labs    10/05/18 0902 11/17/18 0758 04/10/19 0816 06/05/19 0928  NA 139 139 135 136  K 4.3 3.9 3.9 4.2  CL 104 110 98 102  CO2 28 21* 27 26  GLUCOSE 129* 146* 184* 106*  BUN '12 13 15 14  '$ CALCIUM 8.9 9.3 9.3 8.8  CREATININE 0.78 0.79 0.87 0.81  GFRNONAA  --  >60  --   --   GFRAA  --  >60  --   --     LIVER FUNCTION TESTS: Recent Labs    07/20/18 0827  BILITOT 1.2  AST 17  ALT 15  ALKPHOS 64  PROT 6.9  ALBUMIN 4.0    TUMOR MARKERS: No results for input(s): AFPTM, CEA, CA199, CHROMGRNA in the last 8760 hours.  Assessment and Plan:  Mia Wilson is a 43 y.o. (G1, P0 - patient has 4 adopted sons) female with past medical history significant for type 2 diabetes and polycystic ovarian syndrome who is evaluated today via telemedicine following uterine fibroid embolization performed 11/17/2018.  The patient is very pleased with the outcome of the procedure stating significant improvement in her menorrhagia as well as improvement in her bulk symptoms.  Given clinical success of the procedure, postprocedural imaging will not be obtained.  I explained that I am hopeful that the procedure will give her a durable result until the onset of  menopause though there is a small likelihood she could regrow symptomatic fibroids prior to menopause.  Patient demonstrated excellent understanding of the above conversation.  Patient knows to call the interventional radiology clinic with any future questions or concerns though may otherwise follow-up on a PRN basis.  Thank you for this interesting consult.  I greatly enjoyed meeting Mia Wilson and look forward to participating in their care.  A copy of this report was sent to the requesting provider on this date.  Electronically Signed: Sandi Mariscal 06/28/2019, 8:32 AM   I spent a total of 10 Minutes in remote  clinical consultation, greater than 50% of which was counseling/coordinating care for post UFE.    Visit type: Audio only (telephone). Audio (no video) only due to patient's lack of internet/smartphone capability. Alternative for in-person consultation at [facility], [address], [place], [place]. This visit type was conducted due to national recommendations for restrictions regarding the COVID-19 Pandemic (e.g. social distancing).  This format is felt to be most appropriate for this patient at this time.  All issues noted in this document were discussed and addressed.

## 2019-07-24 ENCOUNTER — Other Ambulatory Visit: Payer: Self-pay | Admitting: Endocrinology

## 2019-08-15 ENCOUNTER — Other Ambulatory Visit: Payer: Self-pay | Admitting: Endocrinology

## 2019-09-14 LAB — TSH: TSH: 1.49 (ref <=5.90)

## 2019-09-14 LAB — HEMOGLOBIN A1C: Hemoglobin A1C: 7.2

## 2019-10-03 ENCOUNTER — Other Ambulatory Visit: Payer: Self-pay

## 2019-10-03 ENCOUNTER — Other Ambulatory Visit (INDEPENDENT_AMBULATORY_CARE_PROVIDER_SITE_OTHER): Payer: BC Managed Care – PPO

## 2019-10-03 DIAGNOSIS — Z794 Long term (current) use of insulin: Secondary | ICD-10-CM | POA: Diagnosis not present

## 2019-10-03 DIAGNOSIS — E1165 Type 2 diabetes mellitus with hyperglycemia: Secondary | ICD-10-CM | POA: Diagnosis not present

## 2019-10-03 LAB — HEMOGLOBIN A1C: Hgb A1c MFr Bld: 7.1 % — ABNORMAL HIGH (ref 4.6–6.5)

## 2019-10-03 LAB — BASIC METABOLIC PANEL
BUN: 14 mg/dL (ref 6–23)
CO2: 27 mEq/L (ref 19–32)
Calcium: 9.3 mg/dL (ref 8.4–10.5)
Chloride: 103 mEq/L (ref 96–112)
Creatinine, Ser: 0.71 mg/dL (ref 0.40–1.20)
GFR: 107.57 mL/min (ref >60.00)
Glucose, Bld: 114 mg/dL — ABNORMAL HIGH (ref 70–99)
Potassium: 4.6 mEq/L (ref 3.5–5.1)
Sodium: 136 mEq/L (ref 135–145)

## 2019-10-06 ENCOUNTER — Ambulatory Visit: Payer: BC Managed Care – PPO | Admitting: Endocrinology

## 2019-10-06 ENCOUNTER — Other Ambulatory Visit: Payer: Self-pay

## 2019-10-06 ENCOUNTER — Encounter: Payer: Self-pay | Admitting: Endocrinology

## 2019-10-06 VITALS — BP 104/68 | HR 87 | Ht 65.5 in | Wt 148.8 lb

## 2019-10-06 DIAGNOSIS — N914 Secondary oligomenorrhea: Secondary | ICD-10-CM | POA: Diagnosis not present

## 2019-10-06 DIAGNOSIS — E1165 Type 2 diabetes mellitus with hyperglycemia: Secondary | ICD-10-CM

## 2019-10-06 DIAGNOSIS — R1084 Generalized abdominal pain: Secondary | ICD-10-CM | POA: Diagnosis not present

## 2019-10-06 DIAGNOSIS — R61 Generalized hyperhidrosis: Secondary | ICD-10-CM | POA: Diagnosis not present

## 2019-10-06 DIAGNOSIS — Z794 Long term (current) use of insulin: Secondary | ICD-10-CM

## 2019-10-06 LAB — FOLLICLE STIMULATING HORMONE: FSH: 77.7 m[IU]/mL

## 2019-10-06 MED ORDER — FREESTYLE LIBRE 2 SENSOR SYSTM MISC: 1.0000 [IU] | Freq: Once | 3 refills | Status: DC

## 2019-10-06 MED ORDER — FREESTYLE LIBRE 2 READER SYSTM DEVI: 1.0000 | Freq: Once | 0 refills | Status: AC

## 2019-10-06 NOTE — Progress Notes (Signed)
[name], female   DOB: 1976/01/15, 43 y.o.   MRN: [id number]    Reason for Appointment: Insulin pump follow-up   History of Present Illness   Diagnosis: date of diagnosis: 2009, initially asymptomatic, has type 2 diabetes.   RECENT history:     A1c is slightly higher at 7.1  Current treatment regimen:   OMNIPOD insulin pump Basal rates: 0. 5 until 5 AM, 5 AM- 6 PM = 0.45, 6 PM-12 AM = 0. 5  I/C 12, ISF: 60, Target: 110, with correction over 110.  Total insulin dose recently 25 units/day   Non-insulin hypoglycemic drugs: Farxiga 5 mg daily  Blood sugar patterns, management:  She stopped using the freestyle libre in June but she did not get the [facility] because of expense  Although her A1c is somewhat higher she does not have any consistently high readings except after dinner when she checks them  Also now appears to have relatively higher readings before breakfast at 10 AM  She appears to have gained weight this year despite continuing Farxiga  Does not exercise lately for various reasons  Hypoglycemia occurring only occasionally with over estimating her boluses or doing any excessive correction  This is mostly because late afternoon or rarely after dinner  She is getting somewhat higher basal rate compared to her last visit even though it is still only 10 units/day  Postprandial hyperglycemia occurs when she is underestimating her carbohydrate intake or eating certain foods like rice and bread or popcorn  Side effects from medications: Diarrhea from metformin and abdominal discomfort  Glucometer: Contour   PRE-MEAL Fasting Lunch Dinner Bedtime Overall  Glucose range:  112-192  123-219  68-151    Mean/median:  158     142   POST-MEAL PC Breakfast PC Lunch PC Dinner  Glucose range:    56-336  Mean/median:      Previous readings:   PRE-MEAL Fasting Lunch Dinner Bedtime Overall  Glucose range:  113-200  81-125  86-206      Mean/median:  140   158   134+/-41   POST-MEAL PC Breakfast PC Lunch PC Dinner  Glucose range:    57-158  Mean/median:    129     Meals: 3 meals per day, vegetarian, tries to get protein like nuts, [name] yogurt, soy, fish  Dinner usually at 7-8 pm.Cheese sandwiich, or vegetable soup with cheese, may have 1-2 starches per meal Sometimes will have  peanut butter sandwich for lunch  Physical activity: exercise is minimal, mostly walking stairs, upto 6,000 steps per day  Dietician visit: Most recent: 03/2013  CDE visit: 10/15    Wt Readings from Last 3 Encounters:  10/06/19 148 lb 12.8 oz (67.5 kg)  12/13/18 142 lb (64.4 kg)  12/13/18 142 lb 3.2 oz (64.5 kg)    LABS:  Lab Results  Component Value Date   HGBA1C 7.1 (H) 10/03/2019   HGBA1C 7.2 09/14/2019   HGBA1C 6.9 (H) 06/05/2019   Lab Results  Component Value Date   MICROALBUR <0.7 06/05/2019   LDLCALC 64 06/05/2019   CREATININE 0.71 10/03/2019      Lab on 10/03/2019  Component Date Value Ref Range Status   Sodium 10/03/2019 136  135 - 145 mEq/L Final   Potassium 10/03/2019 4.6  3.5 - 5.1 mEq/L Final   Chloride 10/03/2019 103  96 - 112 mEq/L Final   CO2 10/03/2019 27  19 - 32 mEq/L  Final   Glucose, Bld 10/03/2019 114* 70 - 99 mg/dL Final   BUN 10/03/2019 14  6 - 23 mg/dL Final   Creatinine, Ser 10/03/2019 0.71  0.40 - 1.20 mg/dL Final   GFR 10/03/2019 107.57  >60.00 mL/min Final   Calcium 10/03/2019 9.3  8.4 - 10.5 mg/dL Final   Hgb A1c MFr Bld 10/03/2019 7.1* 4.6 - 6.5 % Final   Glycemic Control Guidelines for People with Diabetes:Non Diabetic:  <6%Goal of Therapy: <7%Additional Action Suggested:  >8%     Allergies as of 10/06/2019      Reactions   Other Anaphylaxis   Peaches , tomatoes, strawberry   Strawberry Extract Hives   Facial hives and mouth hives   Tomato Hives   Facial hives and mouth hives      Medication List       Accurate as of October 06, 2019  8:47 AM. If you have any  questions, ask your nurse or doctor.        DULoxetine 60 MG capsule Commonly known as: CYMBALTA Take 60 mg by mouth daily.   Farxiga 5 MG Tabs tablet Generic drug: dapagliflozin propanediol Take 5 mg by mouth daily.   FUSION PLUS PO Take 1 capsule by mouth daily. Takes for anemia   glucose blood test strip Use Onetouch Verio Flex as instructed to check blood sugar before and after each meal.   insulin pump Soln Inject 1 each into the skin See admin instructions. Novolog 100 units/ml per insulin pump   multivitamin with minerals Tabs tablet Take 1 tablet by mouth daily.   Norethin Ace-Eth Estrad-FE 1-20 MG-MCG(24) Chew Chew 1 tablet by mouth daily.   NovoLOG 100 UNIT/ML injection Generic drug: insulin aspart INJECT 45 UNITS INTO THE SKIN DAILY.   OneTouch Delica Plus [id number] Misc 1 each by Does not apply route See admin instructions. Use Onetouch Delica Plus lancets to check blood sugar before and after each meal.   OneTouch Verio Flex System w/Device Kit Use Onetouch verio flex meter to check blood sugar before and after each meal.       Allergies:  Allergies  Allergen Reactions   Other Anaphylaxis    Peaches , tomatoes, strawberry   Strawberry Extract Hives    Facial hives and mouth hives   Tomato Hives    Facial hives and mouth hives    Past Medical History:  Diagnosis Date   Back pain    Diabetes mellitus    Fibroids    PCOS (polycystic ovarian syndrome)     Past Surgical History:  Procedure Laterality Date   BREAST EXCISIONAL BIOPSY Right    BREAST SURGERY     benign   IR ANGIOGRAM PELVIS SELECTIVE OR SUPRASELECTIVE  11/17/2018   IR ANGIOGRAM PELVIS SELECTIVE OR SUPRASELECTIVE  11/17/2018   IR ANGIOGRAM SELECTIVE EACH ADDITIONAL VESSEL  11/17/2018   IR ANGIOGRAM SELECTIVE EACH ADDITIONAL VESSEL  11/17/2018   IR EMBO TUMOR ORGAN ISCHEMIA INFARCT INC GUIDE ROADMAPPING  11/17/2018   IR RADIOLOGIST EVAL & MGMT  09/14/2018   IR  RADIOLOGIST EVAL & MGMT  12/13/2018   IR RADIOLOGIST EVAL & MGMT  06/28/2019   IR US GUIDE VASC ACCESS RIGHT  11/17/2018   UTERINE FIBROID SURGERY      Family History  Problem Relation Age of Onset   Hyperlipidemia Other    Hypertension Other    Stroke Other    Cancer Other    Diabetes Maternal Uncle    Heart disease  Paternal Uncle    Diabetes Maternal Grandmother    Heart disease Maternal Grandmother    Breast cancer Maternal Grandmother        diagnosed in her 24's   Diabetes Paternal Grandmother     Social History:  reports that she has never smoked. She has never used smokeless tobacco. She reports that she does not drink alcohol or use drugs.  Review of Systems -  She is having multiple other problems for the last couple of months or so   Episodes of profuse sweating for the last 2 months or so.  She does not have any flushing or palpitations with this and has random episodes of sweating but no heat intolerance or sweats.  Her thyroid level was normal with PCP last month.    She has been off her birth control pill for 3 to 4 months but has not had a menstrual cycle since then.  Has not seen her gynecologist recently  Nausea, abdominal cramping and constipation.  This is persistent for about 2 months and she has seen her PCP for this but no treatment or recommendations done    Has high LDL at times, although her LDL was only 100 PCP has started her on Lipitor 10 mg Recent labs show improved results  Lab Results  Component Value Date   CHOL 147 06/05/2019   HDL 71.80 06/05/2019   LDLCALC 64 06/05/2019   TRIG 55.0 06/05/2019   CHOLHDL 2 06/05/2019       Examination:   BP 104/68 (BP Location: Left Arm, Patient Position: Sitting, Cuff Size: Normal)    Pulse 87    Ht 5' 5.5" (1.664 m)    Wt 148 lb 12.8 oz (67.5 kg)    SpO2 96%    BMI 24.39 kg/m   Body mass index is 24.39 kg/m.    Assesment:   DIABETES type 2:  See history of present illness for  detailed discussion of  current management, blood sugar patterns and problems identified  Her A1c has gone up slightly  She is on the Omni pod insulin pump and Farxiga  Again she is not using a CGM which has been beneficial previously Mostly has postprandial hyperglycemia especially with certain carbohydrates such as rice and popcorn but usually occurring after dinner and occasionally after lunch also She is checking blood sugars consistently at each meal and bolusing However occasionally may overdo the boluses  Her weight has gone up and not clear why  Recommendations today: She will try the freestyle libre 2 to see if it is more accurate May benefit from the OmniPod pump once it is linked to the [facility] Alternatively may consider the guardian and Medtr[employer] pumps Increase basal rate between 5 AM and 10 AM up to 0.50 Continue same carbohydrate ratio but add another 2 units for higher carbohydrate meals in the evenings such as with eating rice and bread Also have advised her again to restart exercise  GI symptoms: Discussed that these are not related to her diabetes or her medications and she will need to request a GI referral  Episodes of sweating: May be early menopause and will check [place] Does not appear to have any symptoms suggestive of pheochromocytoma Thyroid level normal  LIPIDS: She will continue to follow with PCP as before  Total visit time for evaluation and management of multiple problems and counseling = 40 minutes   There are no Patient Instructions on file for this visit.  Elayne Snare 10/06/2019, 8:47 AM   Note: This office note was prepared with Dragon voice recognition system technology. Any transcriptional errors that result from this process are unintentional.

## 2019-10-06 NOTE — Patient Instructions (Signed)
Basal at 5 am will be 0.50 until 10 am  10-6 pm is 0.45

## 2019-10-07 ENCOUNTER — Other Ambulatory Visit: Payer: Self-pay | Admitting: Endocrinology

## 2019-10-16 ENCOUNTER — Other Ambulatory Visit: Payer: BC Managed Care – PPO

## 2019-10-24 ENCOUNTER — Ambulatory Visit: Payer: BC Managed Care – PPO | Admitting: Endocrinology

## 2019-11-08 ENCOUNTER — Other Ambulatory Visit: Payer: Self-pay | Admitting: Endocrinology

## 2019-11-26 ENCOUNTER — Other Ambulatory Visit: Payer: Self-pay | Admitting: Endocrinology

## 2019-12-30 IMAGING — XA IR ANGIO/ADD [PERSON_NAME]
11 of 14 series · 13 of 24 positions shown · IV contrast (IODINE)
Comparison: none

INDICATION: Symptomatic uterine fibroids. Patient presents today for bilateral
uterine artery embolization.

[Series 2: fl - angio · 1 of 26 frames shown]
[frame 4/26]
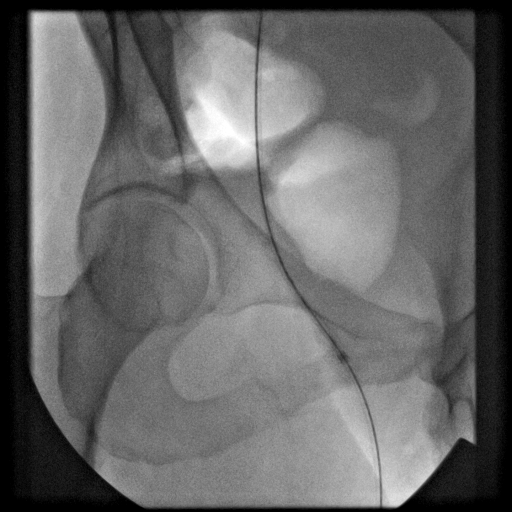

[Series 3: care body 4 · 1 of 15 frames shown (1 of 9)]
[frame 8/15]
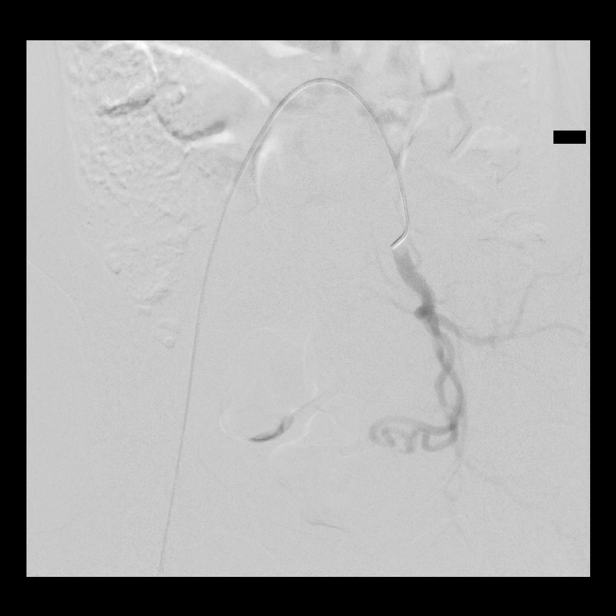

[Series 4: care body 4 · 1 of 15 frames shown (2 of 9)]
[frame 14/15]
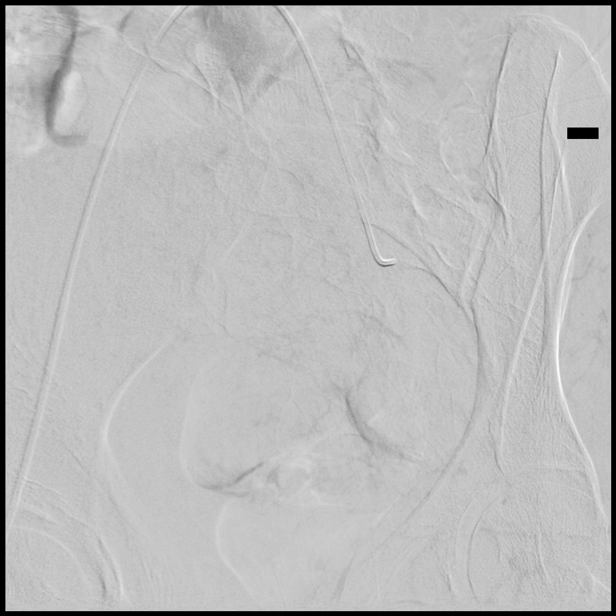

[Series 6: care body 4 · 1 of 18 frames shown (3 of 9)]
[frame 3/18]
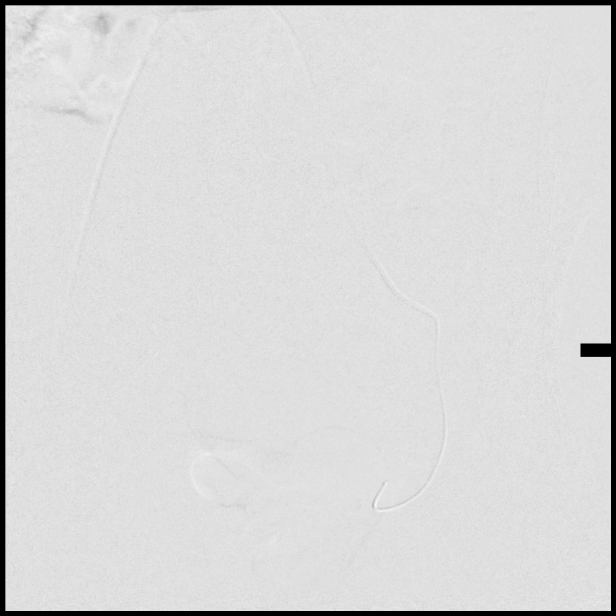

[Series 7: care body 4 · 1 of 21 frames shown (4 of 9)]
[frame 11/21]
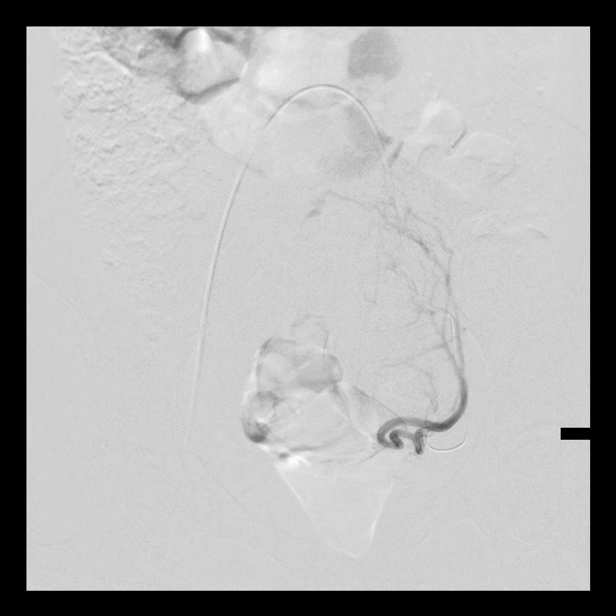

[Series 8: care body 4 · 1 of 23 frames shown (5 of 9)]
[frame 23/23]
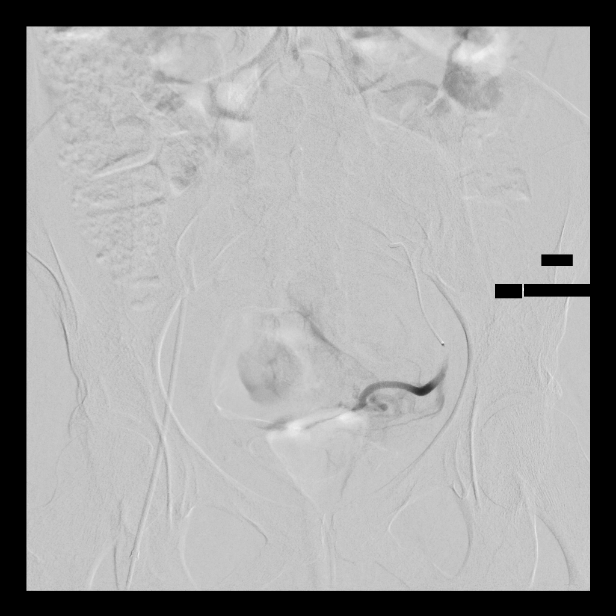

[Series 10: care body 4 · 2 of 15 frames shown (6 of 9)]
[frame 3/15]
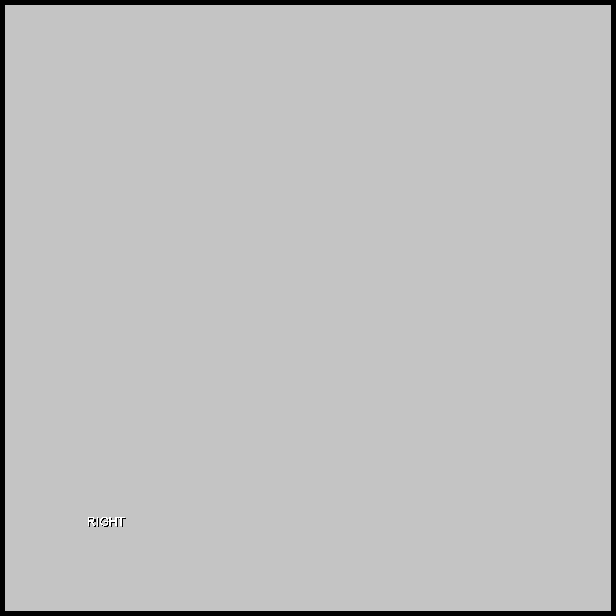
[frame 13/15]
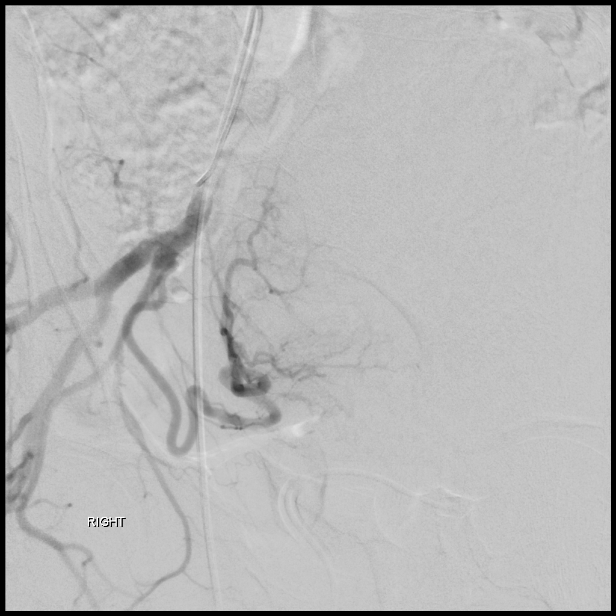

[Series 12: care body 4 · 1 of 21 frames shown (7 of 9)]
[frame 4/21]
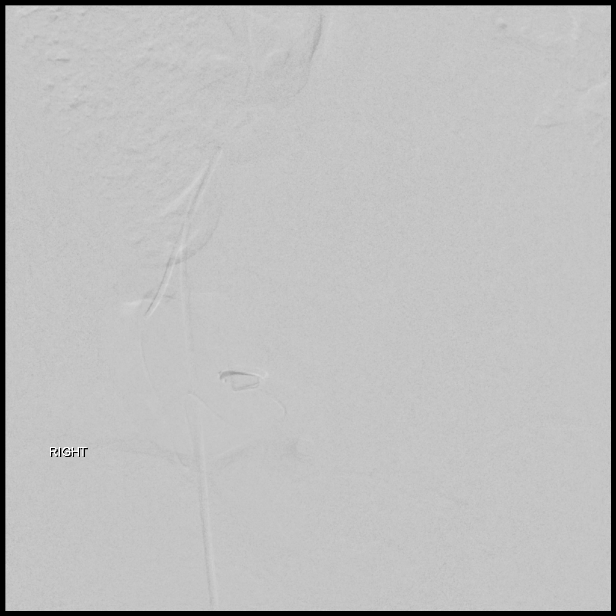

[Series 13: care body 4 · 1 of 28 frames shown (8 of 9)]
[frame 15/28]
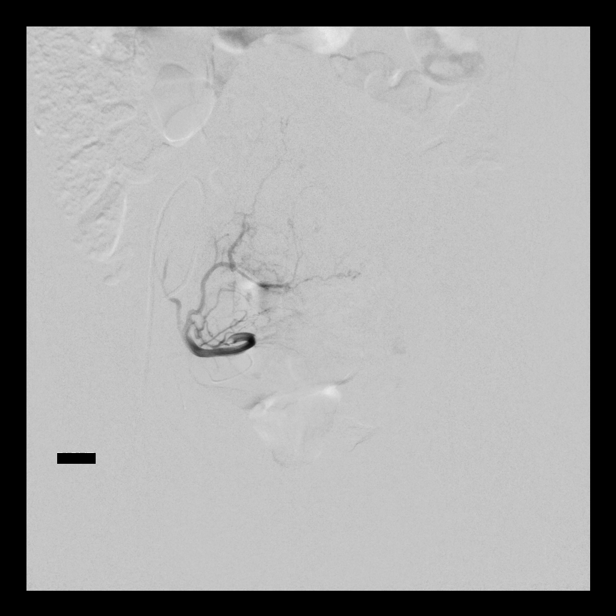

[Series 14: care body 4 · 1 of 17 frames shown (9 of 9)]
[frame 15/17]
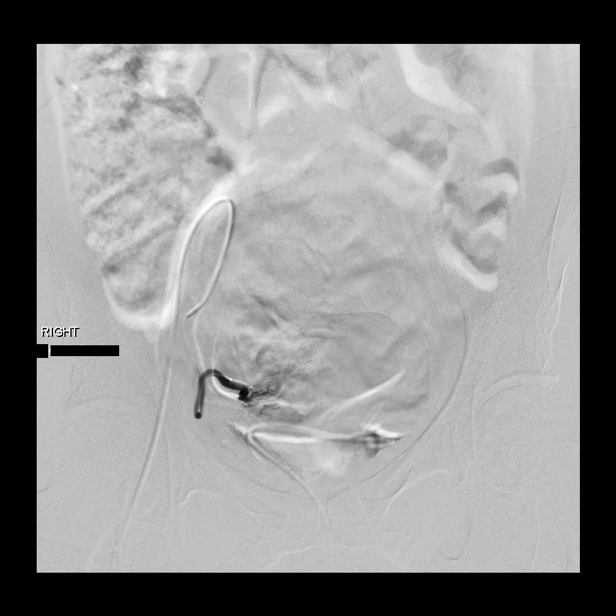

[Series 300: ld dsa body · 2 of 15 slices shown]
[im 8/15]
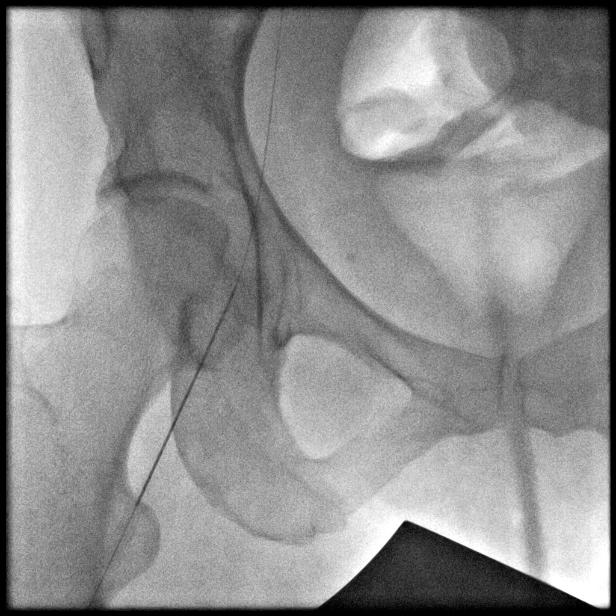
[im 15/15]
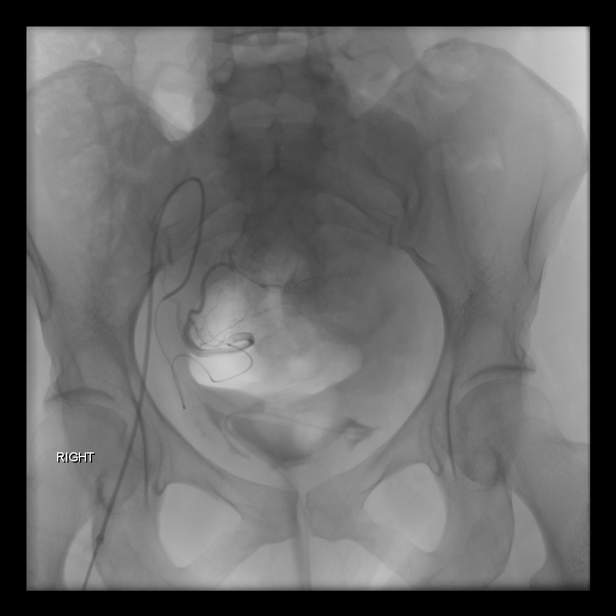

[13 of 24 positions shown; findings below may reference images not displayed]

Please refer to formal consultation in the [REDACTED] EMR dated 09/14/2018
for additional details.

EXAM:
Uterine Fibroid Embolization

MEDICATIONS:
Toradol 30 mg IV; Ancef 2 gm IV. The antibiotic was administered
within one hour of the procedure

ANESTHESIA/SEDATION:
Moderate (conscious) sedation was employed during this procedure. A
total of Versed 4 mg and Fentanyl 200 mcg was administered
intravenously.

Moderate Sedation Time: 60 minutes. The patient's level of
consciousness and vital signs were monitored continuously by
radiology nursing throughout the procedure under my direct
supervision.

CONTRAST:  85 cc Omnipaque 300

FLUOROSCOPY TIME:  17 minutes, 54 seconds (348.8 mGy)

COMPLICATIONS:
None immediate.



The right femoral head was marked fluoroscopically. Under sterile
conditions and local anesthesia, the right common femoral artery
access was performed with a micropuncture needle. Under direct
ultrasound guidance, the right common femoral was accessed with a
micropuncture kit. An ultrasound image was saved for documentation
purposes. This allowed for placement of a 5-French vascular sheath.
A limited arteriogram was performed through the side arm of the
sheath confirming appropriate access within the right common femoral
artery.

The 5-French C2 catheter was utilized to select the contralateral
left internal iliac artery. Selective left internal iliac angiogram
was performed. The tortuous left uterine artery was identified.
Selective catheterization was performed of the left uterine artery
with a microcatheter and micro guide wire. A selective left uterine
angiogram was performed. This demonstrated patency of the left
uterine artery. Mild diffuse hypervascularity of the enlarged
fibroid uterus. Access was adequate for embolization. For
embolization, 3 vials of 500 - 700 micron Embospheres were injected
into the left uterine artery. Post embolization angiogram confirms
complete stasis of the left uterine vascular territory.
Microcatheter was removed.

The C2 catheter was retracted and utilized to select the right
internal iliac artery. Selective right internal iliac angiogram was
performed. The patent right uterine artery was identified. For
selective catheterization, the micro catheter and guidewire were
utilized to select the right uterine artery. Selective right uterine
angiogram was performed. This demonstrated patency of the right
uterine artery. Catheter position was safe for embolization.
Embolization was performed to complete stasis with injection of [REDACTED] vials of 500-700 micron Embospheres. Post embolization
angiogram confirms complete stasis of the right uterine vascular
territory.

At this point, all wires, catheters and sheaths were removed from
the patient. Hemostasis was achieved at the right groin access site
with

The patient tolerated the procedure well without immediate post
procedural complication.
FINDINGS: Bilateral internal iliac arteriograms demonstrates conventional
branching pattern with widely patent origins of the bilateral
uterine arteries.

Sub selective bilateral uterine arteriograms demonstrate co-dominant
supply to a hypertrophied myomatous uterus.

Completion arteriograms following bilateral uterine artery particle
embolization demonstrates a technically excellent result with stasis
of flow within the bilateral uterine vascular territories.
IMPRESSION: Successful bilateral uterine artery embolization ([facility]).

PLAN:
The patient will be seen in follow-up consultation at the
[REDACTED] in approximately 3-4 weeks.

## 2020-01-08 ENCOUNTER — Other Ambulatory Visit: Payer: Self-pay

## 2020-01-08 ENCOUNTER — Other Ambulatory Visit (INDEPENDENT_AMBULATORY_CARE_PROVIDER_SITE_OTHER): Payer: BC Managed Care – PPO

## 2020-01-08 DIAGNOSIS — E1165 Type 2 diabetes mellitus with hyperglycemia: Secondary | ICD-10-CM

## 2020-01-08 DIAGNOSIS — Z794 Long term (current) use of insulin: Secondary | ICD-10-CM

## 2020-01-08 LAB — BASIC METABOLIC PANEL
BUN: 11 mg/dL (ref 6–23)
CO2: 28 mEq/L (ref 19–32)
Calcium: 9.7 mg/dL (ref 8.4–10.5)
Chloride: 101 mEq/L (ref 96–112)
Creatinine, Ser: 0.78 mg/dL (ref 0.40–1.20)
GFR: 96.39 mL/min (ref >60.00)
Glucose, Bld: 109 mg/dL — ABNORMAL HIGH (ref 70–99)
Potassium: 4 mEq/L (ref 3.5–5.1)
Sodium: 138 mEq/L (ref 135–145)

## 2020-01-08 LAB — HEMOGLOBIN A1C: Hgb A1c MFr Bld: 6.7 % — ABNORMAL HIGH (ref 4.6–6.5)

## 2020-01-11 ENCOUNTER — Other Ambulatory Visit: Payer: Self-pay

## 2020-01-11 ENCOUNTER — Encounter: Payer: BC Managed Care – PPO | Admitting: Endocrinology

## 2020-01-11 ENCOUNTER — Encounter: Payer: Self-pay | Admitting: Endocrinology

## 2020-01-12 NOTE — Progress Notes (Signed)
This encounter was created in error - please disregard.

## 2020-01-15 ENCOUNTER — Telehealth: Payer: Self-pay | Admitting: Endocrinology

## 2020-01-15 NOTE — Telephone Encounter (Signed)
Pt is set for inperson this wednesday

## 2020-01-15 NOTE — Telephone Encounter (Signed)
Noted  

## 2020-01-17 ENCOUNTER — Other Ambulatory Visit: Payer: Self-pay

## 2020-01-17 ENCOUNTER — Encounter: Payer: Self-pay | Admitting: Endocrinology

## 2020-01-17 ENCOUNTER — Ambulatory Visit: Payer: BC Managed Care – PPO | Admitting: Endocrinology

## 2020-01-17 VITALS — BP 120/70 | HR 80 | Resp 16 | Ht 65.0 in | Wt 151.2 lb

## 2020-01-17 DIAGNOSIS — E1165 Type 2 diabetes mellitus with hyperglycemia: Secondary | ICD-10-CM | POA: Diagnosis not present

## 2020-01-17 DIAGNOSIS — Z794 Long term (current) use of insulin: Secondary | ICD-10-CM | POA: Diagnosis not present

## 2020-01-17 NOTE — Progress Notes (Signed)
[name], female   DOB: [birth year], 44 y.o.   MRN: [id number]    Reason for Appointment: Diabetes follow-up  History of Present Illness   Diagnosis: date of diagnosis: 2009, initially asymptomatic, has type 2 diabetes.   RECENT history:     A1c is slightly better at 6.7, previously was at 7.1  Current treatment regimen:  OMNIPOD insulin pump Basal rates: 0. 45 until 5 AM, 5 AM- 6 PM = 0.35, 6 PM-12 AM = 0. 45  I/C 12, ISF: 60, Target: 110, with correction over 110.  Total insulin dose recently 21 units/day   Non-insulin hypoglycemic drugs: Farxiga 5 mg daily  Blood sugar patterns, management:  She restarted using the freestyle libre after her last visit when she was able to get this on her insurance affordably  Her sensor appears to be more accurate than the original freestyle libre and this appeared to be concordant on the days she had lab work with blood sugar of 109  Her basal rates have been reduced previously and is only getting 9.5 units basal throughout the day  She is now trying to follow a vegan diet  With this she will frequently not get enough protein with her lunch meal which is her main meal when she will have more blood sugar spikes  Also usually trying to bolus when she is starting to eat or right after starting instead of ahead of time  Yesterday blood sugar was increasing about 100 points from lunch when eating a waffle and vegan yogurt  She has been trying to be active with walking more steps throughout the day with no formal exercise  Occasionally when she is more active blood sugars will be low  HYPOGLYCEMIA as discussed below is mostly early morning and around dinnertime when she can be symptomatic, appears to have about 12% of her readings below target  However hypoglycemia has been relatively less in the last week  Side effects from medications: Diarrhea from metformin and abdominal discomfort  Glucometer:  Contour   CONTINUOUS GLUCOSE MONITORING RECORD INTERPRETATION    Dates of Recording: 2/10 through 2/23  Sensor description: [employer] version 2  Results statistics:   CGM use % of time  96  Average and SD 111  Time in range    80    %  % Time Above 180  7  % Time above 250   % Time Below target  12    PRE-MEAL Fasting Lunch Dinner Bedtime Overall  Glucose range:       Mean/median:  84  115  102  117  111   POST-MEAL PC Breakfast PC Lunch PC Dinner  Glucose range:     Mean/median:  105  144  134    Glycemic patterns summary: Blood sugars are mostly within the target range except sporadically after lunch and dinner when they are higher and lower below normal around 3 AM or 6 PM Had more variability in the late afternoons  Hyperglycemic episodes are occurring primarily at lunch and occasionally at dinnertime but only occasionally blood sugars are going up over 180  Hypoglycemic episodes occurred overnight and before dinnertime mostly and occasionally at 2 PM, less in the last week  Overnight periods: Blood sugars are very consistently low normal and the lowest between 4-5 AM with some hypoglycemia  Preprandial periods: Blood sugars are in near normal at breakfast, only mildly increased at lunch and variable at dinnertime May also have hypoglycemia  before dinner  Postprandial periods:   After breakfast: Blood sugars are relatively well controlled After lunch:   Blood sugars are fluctuating with spikes in blood sugars at least 2 or 3 times a week  After dinner: Most blood sugars are well within the target range without excessive rise  PREVIOUS data:  PRE-MEAL Fasting Lunch Dinner Bedtime Overall  Glucose range:  112-192  123-219  68-151    Mean/median:  158     142   POST-MEAL PC Breakfast PC Lunch PC Dinner  Glucose range:    56-336  Mean/median:        Meals: 3 meals per day, vegetarian, tries to get protein like nuts, [name] yogurt, soy, fish  Dinner  usually at 7-8 pm.Cheese sandwiich, or vegetable soup with cheese, may have 1-2 starches per meal Sometimes will have  peanut butter sandwich for lunch  Physical activity:mostly walking stairs, upto 6,000 steps per day  Dietician visit: Most recent: 03/2013  CDE visit: 10/15    Wt Readings from Last 3 Encounters:  01/17/20 151 lb 3.2 oz (68.6 kg)  10/06/19 148 lb 12.8 oz (67.5 kg)  12/13/18 142 lb (64.4 kg)    LABS:  Lab Results  Component Value Date   HGBA1C 6.7 (H) 01/08/2020   HGBA1C 7.1 (H) 10/03/2019   HGBA1C 7.2 09/14/2019   Lab Results  Component Value Date   MICROALBUR <0.7 06/05/2019   LDLCALC 64 06/05/2019   CREATININE 0.78 01/08/2020      No visits with results within 1 Week(s) from this visit.  Latest known visit with results is:  Lab on 01/08/2020  Component Date Value Ref Range Status  . Sodium 01/08/2020 138  135 - 145 mEq/L Final  . Potassium 01/08/2020 4.0  3.5 - 5.1 mEq/L Final  . Chloride 01/08/2020 101  96 - 112 mEq/L Final  . CO2 01/08/2020 28  19 - 32 mEq/L Final  . Glucose, Bld 01/08/2020 109* 70 - 99 mg/dL Final  . BUN 01/08/2020 11  6 - 23 mg/dL Final  . Creatinine, Ser 01/08/2020 0.78  0.40 - 1.20 mg/dL Final  . GFR 01/08/2020 96.39  >60.00 mL/min Final  . Calcium 01/08/2020 9.7  8.4 - 10.5 mg/dL Final  . Hgb A1c MFr Bld 01/08/2020 6.7* 4.6 - 6.5 % Final   Glycemic Control Guidelines for People with Diabetes:Non Diabetic:  <6%Goal of Therapy: <7%Additional Action Suggested:  >8%     Allergies as of 01/17/2020      Reactions   Other Anaphylaxis   Peaches , tomatoes, strawberry   Strawberry Extract Hives   Facial hives and mouth hives   Tomato Hives   Facial hives and mouth hives      Medication List       Accurate as of January 17, 2020  2:19 PM. If you have any questions, ask your nurse or doctor.        STOP taking these medications   insulin pump Soln Stopped by: Elayne Snare, MD   Norethin Ace-Eth Estrad-FE 1-20 MG-MCG(24)  Chew Stopped by: Elayne Snare, MD     TAKE these medications   DULoxetine 60 MG capsule Commonly known as: CYMBALTA Take 60 mg by mouth daily.   Farxiga 5 MG Tabs tablet Generic drug: dapagliflozin propanediol TAKE 5 MG BY MOUTH DAILY.   FUSION PLUS PO Take 1 capsule by mouth daily. Takes for anemia   multivitamin with minerals Tabs tablet Take 1 tablet by mouth daily.   NovoLOG 100  UNIT/ML injection Generic drug: insulin aspart INJECT 45 UNITS INTO THE SKIN DAILY.   OmniPod Dash 5 Pack Pods Misc CHANGE 1 POD EVERY 72 HOURS AS DIRECTED   OneTouch Delica Plus [id number] Misc 1 each by Does not apply route See admin instructions. Use Onetouch Delica Plus lancets to check blood sugar before and after each meal.   OneTouch Verio Flex System w/Device Kit Use Onetouch verio flex meter to check blood sugar before and after each meal.   OneTouch Verio test strip Generic drug: glucose blood USE ONETOUCH VERIO FLEX AS INSTRUCTED TO CHECK BLOOD SUGAR BEFORE AND AFTER EACH MEAL.       Allergies:  Allergies  Allergen Reactions  . Other Anaphylaxis    Peaches , tomatoes, strawberry  . Strawberry Extract Hives    Facial hives and mouth hives  . Tomato Hives    Facial hives and mouth hives    Past Medical History:  Diagnosis Date  . Back pain   . Diabetes mellitus   . Fibroids   . PCOS (polycystic ovarian syndrome)     Past Surgical History:  Procedure Laterality Date  . BREAST EXCISIONAL BIOPSY Right   . BREAST SURGERY     benign  . IR ANGIOGRAM PELVIS SELECTIVE OR SUPRASELECTIVE  11/17/2018  . IR ANGIOGRAM PELVIS SELECTIVE OR SUPRASELECTIVE  11/17/2018  . IR ANGIOGRAM SELECTIVE EACH ADDITIONAL VESSEL  11/17/2018  . IR ANGIOGRAM SELECTIVE EACH ADDITIONAL VESSEL  11/17/2018  . IR EMBO TUMOR ORGAN ISCHEMIA INFARCT INC GUIDE ROADMAPPING  11/17/2018  . IR RADIOLOGIST EVAL & MGMT  09/14/2018  . IR RADIOLOGIST EVAL & MGMT  12/13/2018  . IR RADIOLOGIST EVAL & MGMT   06/28/2019  . IR US GUIDE VASC ACCESS RIGHT  11/17/2018  . UTERINE FIBROID SURGERY      Family History  Problem Relation Age of Onset  . Hyperlipidemia Other   . Hypertension Other   . Stroke Other   . Cancer Other   . Diabetes Maternal Uncle   . Heart disease Paternal Uncle   . Diabetes Maternal Grandmother   . Heart disease Maternal Grandmother   . Breast cancer Maternal Grandmother        diagnosed in her 86's  . Diabetes Paternal Grandmother     Social History:  reports that she has never smoked. She has never used smokeless tobacco. She reports that she does not drink alcohol or use drugs.  Review of Systems -     Episodes of profuse sweating related to menopause resolved after 3 months   She has been off her birth control pill for 3 to 4 months but has not had a menstrual cycle since then.   No further GI symptoms since her colonoscopy and also she is changing her diet  Has high LDL at times, although her LDL was only 100 PCP has started her on Lipitor 10 mg Recent labs show improved results  Lab Results  Component Value Date   CHOL 147 06/05/2019   HDL 71.80 06/05/2019   LDLCALC 64 06/05/2019   TRIG 55.0 06/05/2019   CHOLHDL 2 06/05/2019       Examination:   BP 120/70   Pulse 80   Resp 16   Ht '5\' 5"'$  (1.651 m)   Wt 151 lb 3.2 oz (68.6 kg)   SpO2 99%   BMI 25.16 kg/m   Body mass index is 25.16 kg/m.    Assesment:   DIABETES type 2:  See history of  present illness for detailed discussion of  current management, blood sugar patterns and problems identified  Her A1c has gone down slightly and is below 7  She is on the Omni pod insulin pump and Farxiga  Her A1c is likely lower because of tendency to hypoglycemia now Surprisingly she is needing less basal insulin Currently getting nearly 60% of her insulin with boluses As discussed above she may not be bolusing enough for her lunch meals especially if she has a relative excess of  carbohydrate Also timing of bolus may not be adequate before starting to eat  Her weight has gone up possibly from overall decreased activity, increased carbohydrates and more hypoglycemia  Recommendations today: She will continue the freestyle libre 2 to monitor her blood sugars Will benefit from the OmniPod pump once it is integrated to the Dexcom which hopefully will be later this year She is agreeable to seeing the dietitian She does need better sources of protein with each meal especially at lunchtime She will reduce her basal rate at midnight to 0.4 has also between 5 PM and 7 PM down to 0.30 Discussed that she should treat her hypoglycemia with glucose tablets Also if she is being alerted with blood sugars below 70 and decreasing she can suspend the pump for 30 minutes No change in carbohydrate ratio or active insulin time Sample of Fiasp insulin given and she will let us know if this is more effective in controlling her readings  Reassured her that she should be okay to get the Covid vaccine and will do it when she is eligible  Follow-up in 3 months unless she is having a problem   Patient Instructions  Bolus ahead of meal  Suspend when heading <70 for 30 min  Bolus 1 more unit at lunch if lo protein       Elayne Snare 01/17/2020, 2:19 PM   Note: This office note was prepared with Dragon voice recognition system technology. Any transcriptional errors that result from this process are unintentional.

## 2020-01-17 NOTE — Patient Instructions (Addendum)
Bolus ahead of meal  Suspend when heading <70 for 30 min  Bolus 1 more unit at lunch if lo protein

## 2020-02-19 ENCOUNTER — Other Ambulatory Visit: Payer: Self-pay

## 2020-02-19 MED ORDER — FIASP 100 UNIT/ML ~~LOC~~ SOLN: 30.0000 [IU] | Freq: Every day | SUBCUTANEOUS | 2 refills | Status: DC

## 2020-02-23 ENCOUNTER — Encounter: Payer: BC Managed Care – PPO | Attending: Endocrinology | Admitting: Dietician

## 2020-02-23 ENCOUNTER — Encounter (INDEPENDENT_AMBULATORY_CARE_PROVIDER_SITE_OTHER): Payer: Self-pay

## 2020-02-23 ENCOUNTER — Encounter: Payer: Self-pay | Admitting: Dietician

## 2020-02-23 ENCOUNTER — Other Ambulatory Visit: Payer: Self-pay

## 2020-02-23 DIAGNOSIS — E119 Type 2 diabetes mellitus without complications: Secondary | ICD-10-CM | POA: Diagnosis present

## 2020-02-23 NOTE — Progress Notes (Signed)
Diabetes Self-Management Education  Visit Type: Follow-up  Appt. Start Time: 1045 Appt. End Time: 1150  02/23/2020  [name], identified by name and date of birth, is a 44 y.o. female with a diagnosis of Diabetes:  .   ASSESSMENT Patient is here today alone.  She states that she has a love/hate relationship with food.  In January she has transitioned to a vegan diet.  She states that she loves it and it has caused her not to eat the foods that caused her the problems to begin with such as cake. She has been using the vegan society as a Theatre manager. She is concerned about weight gain. Weight gain during COVID and with menopause. 149 lbs 02/23/20 increased from her last visit with this RD on 08/15/18 or 142 lbs.  She states that her skin is clearing up, less hives and breakouts. Allergy to tomatoes and all red fruit.  She states that she will go a whole day without eating.  She is here today as she does not want to be malnourished and realizes that her eating habits are not what they should be.   She has increased stress related to working from home and children schooling at home.  Working from home since 01/2019.  2 children at home with special needs doing school from home.  Hard to find time for her own self care.   Diet is unbalanced as she skips meals frequently and does not always include a good protein source. Feels nervous to exercise even outdoors without a mask.  HgbA1C 6.7% 01/08/2020 decreased from 7.1% 10/03/2019. Medications include [place] and Fiasp via [employer].  She reports fewer low blood sugars since changing insulin. She uses the [employer] Plans to change to the [facility] when she upgrades her OmniPod.  Patient lives with her husband who works 2 jobs Set designer). 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 She also has 4 boys who are adopted ages 79, 72, 6, and 55. She does the shopping and  cooking.  Weight 149 lb (67.6 kg). Body mass index is 24.79 kg/m.  Diabetes Self-Management Education - 02/23/20 1649      Visit Information   Visit Type  Follow-up      Psychosocial Assessment   Patient Belief/Attitude about Diabetes  Motivated to manage diabetes    Self-care barriers  Other (comment)   time   Self-management support  Doctor's office;Family;CDE visits    Other persons present  Patient    Patient Concerns  Nutrition/Meal planning;Glycemic Control    Special Needs  None    Preferred Learning Style  No preference indicated    Learning Readiness  Ready      Pre-Education Assessment   Patient understands the diabetes disease and treatment process.  Demonstrates understanding / competency    Patient understands incorporating nutritional management into lifestyle.  Needs Review    Patient undertands incorporating physical activity into lifestyle.  Demonstrates understanding / competency    Patient understands using medications safely.  Demonstrates understanding / competency    Patient understands monitoring blood glucose, interpreting and using results  Demonstrates understanding / competency    Patient understands prevention, detection, and treatment of acute complications.  Demonstrates understanding / competency    Patient understands prevention, detection, and treatment of chronic complications.  Demonstrates understanding / competency    Patient understands how to develop strategies to address psychosocial issues.  Demonstrates understanding / competency  Patient understands how to develop strategies to promote health/change behavior.  Demonstrates understanding / competency      Complications   Last HgB A1C per patient/outside source  6.7 %   01/08/2020 decreased from 7.1% 10/03/19   How often do you check your blood sugar?  > 4 times/day      Dietary Intake   Breakfast  often skips    Lunch  occasional salad OR leftovers OR soup    Snack (afternoon)  2  times per week- smoothie in [employer] (almond or oat milk, banana, blueberries, occasional pienapple, avocado, spinach, chia seeds OR greens smoothie, lemon)    Dinner  tofu, broccoli, brown rice OR apple and almond milk OR vegan chicken patty    Beverage(s)  water, green tea or oolong tea      Exercise   Exercise Type  Light (walking / raking leaves)      Patient Education   Previous Diabetes Education  Yes (please comment)   2020   Nutrition management   Role of diet in the treatment of diabetes and the relationship between the three main macronutrients and blood glucose level;Meal options for control of blood glucose level and chronic complications.    Physical activity and exercise   Role of exercise on diabetes management, blood pressure control and cardiac health.    Medications  Reviewed patients medication for diabetes, action, purpose, timing of dose and side effects.    Acute complications  Taught treatment of hypoglycemia - the 15 rule.      Individualized Goals (developed by patient)   Nutrition  General guidelines for healthy choices and portions discussed;Other (comment)   carbohydrate counting   Physical Activity  Exercise 5-7 days per week;30 minutes per day    Medications  take my medication as prescribed    Monitoring   test my blood glucose as discussed    Problem Solving  protein with all meals    Reducing Risk  examine blood glucose patterns    Health Coping  discuss diabetes with (comment)   MD, RD, CDE     Patient Self-Evaluation of Goals - Patient rates self as meeting previously set goals (% of time)   Nutrition  >75%    Physical Activity  >75%    Medications  >75%    Monitoring  >75%    Problem Solving  >75%    Reducing Risk  >75%    Health Coping  >75%      Post-Education Assessment   Patient understands the diabetes disease and treatment process.  Demonstrates understanding / competency    Patient understands incorporating nutritional management into  lifestyle.  Demonstrates understanding / competency    Patient undertands incorporating physical activity into lifestyle.  Demonstrates understanding / competency    Patient understands using medications safely.  Demonstrates understanding / competency    Patient understands monitoring blood glucose, interpreting and using results  Demonstrates understanding / competency    Patient understands prevention, detection, and treatment of acute complications.  Demonstrates understanding / competency    Patient understands prevention, detection, and treatment of chronic complications.  Demonstrates understanding / competency    Patient understands how to develop strategies to address psychosocial issues.  Needs Review    Patient understands how to develop strategies to promote health/change behavior.  Needs Review      Outcomes   Expected Outcomes  Demonstrated interest in learning. Expect positive outcomes    Future DMSE  2 months  Program Status  Not Completed      Subsequent Visit   Since your last visit have you continued or begun to take your medications as prescribed?  Yes    Since your last visit have you had your blood pressure checked?  Yes    Since your last visit have you experienced any weight changes?  Gain    Weight Gain (lbs)  7    Since your last visit, are you checking your blood glucose at least once a day?  Yes       Individualized Plan for Diabetes Self-Management Training:   Learning Objective:  Patient will have a greater understanding of diabetes self-management. Patient education plan is to attend individual and/or group sessions per assessed needs and concerns.   Plan:   Patient Instructions  Ideas/Resources: Dr. Stephanie Acre Daily Dozen Nutrition Facts.org  The Kick Diabetes Cookbook: An Action Plan and Recipes for Defeating Diabetes by Narda Amber RD (Author), Donnald Garre MS (Author), RD  Kick Diabetes Essentials: The Diet and Lifestyle Guide 1st  Edition by Narda Amber   Mastering Diabetes by Jenny Reichmann, PhD  Meal ideas: Smoothie with protein Oatmeal with berries, chia seeds, walnuts  Lentil or bean soup or other vegan soup and salad greens Kale with white beans, garlic, broth, Gimmi Lean and side of sweet potato or brown rice  Is this a meal that I would serve a friend? A meal should contain a protein plus 2 other parts for optimal balance.  Continue to stay [place] to [place]"   Expected Outcomes:  Demonstrated interest in learning. Expect positive outcomes  Education material provided: pcrm.org:  Diet and Diabetes:  Recipes for Success, Vegetarian proteins, Plant Based Primer  If problems or questions, patient to contact team via:  Phone  Future DSME appointment: 2 months

## 2020-02-23 NOTE — Patient Instructions (Addendum)
Ideas/Resources: Dr. Stephanie Acre Daily Dozen Nutrition Facts.org  The Kick Diabetes Cookbook: An Action Plan and Recipes for Defeating Diabetes by Narda Amber RD (Author), Donnald Garre MS (Author), RD  Kick Diabetes Essentials: The Diet and Lifestyle Guide 1st Edition by Narda Amber   Mastering Diabetes by Jenny Reichmann, PhD  Meal ideas: Smoothie with protein Oatmeal with berries, chia seeds, walnuts  Lentil or bean soup or other vegan soup and salad greens Kale with white beans, garlic, broth, Gimmi Lean and side of sweet potato or brown rice  Is this a meal that I would serve a friend? A meal should contain a protein plus 2 other parts for optimal balance.  Continue to stay [place] ([place] to [place]"

## 2020-02-26 ENCOUNTER — Other Ambulatory Visit: Payer: Self-pay | Admitting: Endocrinology

## 2020-03-24 ENCOUNTER — Other Ambulatory Visit: Payer: Self-pay | Admitting: Endocrinology

## 2020-04-12 ENCOUNTER — Encounter: Payer: BC Managed Care – PPO | Attending: Endocrinology | Admitting: Dietician

## 2020-04-12 ENCOUNTER — Other Ambulatory Visit: Payer: Self-pay

## 2020-04-12 ENCOUNTER — Encounter: Payer: Self-pay | Admitting: Dietician

## 2020-04-12 ENCOUNTER — Other Ambulatory Visit (INDEPENDENT_AMBULATORY_CARE_PROVIDER_SITE_OTHER): Payer: BC Managed Care – PPO

## 2020-04-12 DIAGNOSIS — E1165 Type 2 diabetes mellitus with hyperglycemia: Secondary | ICD-10-CM

## 2020-04-12 DIAGNOSIS — Z794 Long term (current) use of insulin: Secondary | ICD-10-CM | POA: Diagnosis not present

## 2020-04-12 DIAGNOSIS — E119 Type 2 diabetes mellitus without complications: Secondary | ICD-10-CM | POA: Insufficient documentation

## 2020-04-12 LAB — MICROALBUMIN / CREATININE URINE RATIO
Creatinine,U: 111.6 mg/dL
Microalb Creat Ratio: 0.9 mg/g (ref 0.0–30.0)
Microalb, Ur: 1 mg/dL (ref 0.0–1.9)

## 2020-04-12 LAB — LIPID PANEL
Cholesterol: 148 mg/dL (ref 0–200)
HDL: 80.7 mg/dL (ref >39.00)
LDL Cholesterol: 57 mg/dL (ref 0–99)
NonHDL: 67.16
Total CHOL/HDL Ratio: 2
Triglycerides: 52 mg/dL (ref 0.0–149.0)
VLDL: 10.4 mg/dL (ref 0.0–40.0)

## 2020-04-12 LAB — COMPREHENSIVE METABOLIC PANEL
ALT: 22 U/L (ref 0–35)
AST: 27 U/L (ref 0–37)
Albumin: 4.3 g/dL (ref 3.5–5.2)
Alkaline Phosphatase: 95 U/L (ref 39–117)
BUN: 9 mg/dL (ref 6–23)
CO2: 29 mEq/L (ref 19–32)
Calcium: 9.8 mg/dL (ref 8.4–10.5)
Chloride: 102 mEq/L (ref 96–112)
Creatinine, Ser: 0.76 mg/dL (ref 0.40–1.20)
GFR: 99.21 mL/min (ref >60.00)
Glucose, Bld: 72 mg/dL (ref 70–99)
Potassium: 4.4 mEq/L (ref 3.5–5.1)
Sodium: 138 mEq/L (ref 135–145)
Total Bilirubin: 1.4 mg/dL — ABNORMAL HIGH (ref 0.2–1.2)
Total Protein: 7.1 g/dL (ref 6.0–8.3)

## 2020-04-12 LAB — HEMOGLOBIN A1C: Hgb A1c MFr Bld: 6.2 % (ref 4.6–6.5)

## 2020-04-12 NOTE — Progress Notes (Signed)
Diabetes Self-Management Education  Visit Type: Follow-up  Appt. Start Time: 0900 Appt. End Time: 0930  04/12/2020  [name], identified by name and date of birth, is a 44 y.o. female with a diagnosis of Diabetes:  .   ASSESSMENT Patient is here today alone.  She was last seen by myself 02/23/20. She has continued a vegan lifestyle and feels very comfortable and enjoying this.  She would like to be sure she is eating a balanced diet and obtaining proper nutrition. She has been using Dr. Stephanie Acre daily dozen and follows his podcast. She has not felt as tired and has reduced her caffeine intake. She wants to make sure that her meals are balanced. Fasted yesterday in preparation for labs. 15-20 minute work out once per week.  Aims to work out more with her family and take walk breaks while working from home.  Hives with anything red as well as eggplant (tomatoes, strawberries) 24 hour recall Water and green tea 11:30- brown rice, broccoli Snack- apple and peanut butter 6-7 - "Chopped" salad or sesame tofu with brown rice Her meals are now more balanced and has been eating more regularly although still misses breakfast.  History includes:  Type 2 diabetes Medication includes [place] and Fiasp via [employer].  She uses the [employer] and plant to change to the [facility] when she upgrades the [place]. She states that her insulin requirements continue to decrease on a vegan low fat diet.  Labs 04/12/20:  A1C 6.2% (decreased from 6.7%), cholesterol 148, HDL 80, LDL 57, Triglycerides 52  Patient lives with her husband who works 2 jobs (education and [employer]). 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 She also has 4 boys who are adopted ages 21, 28, 75, and 24. She does the shopping and cooking.  Weight 150 lb (68 kg). Body mass index is 24.96 kg/m.  Diabetes Self-Management Education - 04/12/20 1400      Visit  Information   Visit Type  Follow-up      Pre-Education Assessment   Patient understands the diabetes disease and treatment process.  Demonstrates understanding / competency    Patient understands incorporating nutritional management into lifestyle.  Needs Review    Patient undertands incorporating physical activity into lifestyle.  Demonstrates understanding / competency    Patient understands using medications safely.  Demonstrates understanding / competency    Patient understands monitoring blood glucose, interpreting and using results  Demonstrates understanding / competency    Patient understands prevention, detection, and treatment of acute complications.  Demonstrates understanding / competency    Patient understands prevention, detection, and treatment of chronic complications.  Demonstrates understanding / competency    Patient understands how to develop strategies to address psychosocial issues.  Demonstrates understanding / competency    Patient understands how to develop strategies to promote health/change behavior.  Demonstrates understanding / competency      Complications   Last HgB A1C per patient/outside source  6.2 %    How often do you check your blood sugar?  > 4 times/day      Patient Education   Previous Diabetes Education  Yes (please comment)    Nutrition management   Meal options for control of blood glucose level and chronic complications.    Medications  Reviewed patients medication for diabetes, action, purpose, timing of dose and side effects.      Individualized Goals (developed by patient)   Nutrition  General guidelines for healthy choices and portions discussed    Physical Activity  Exercise 5-7 days per week;30 minutes per day    Medications  take my medication as prescribed    Monitoring   test my blood glucose as discussed    Problem Solving  protein with all meals and snacks    Reducing Risk  examine blood glucose patterns    Health Coping  discuss  diabetes with (comment)      Patient Self-Evaluation of Goals - Patient rates self as meeting previously set goals (% of time)   Nutrition  >75%    Physical Activity  25 - 50%    Medications  >75%    Monitoring  >75%    Problem Solving  >75%    Reducing Risk  >75%    Health Coping  >75%      Post-Education Assessment   Patient understands the diabetes disease and treatment process.  Demonstrates understanding / competency    Patient understands incorporating nutritional management into lifestyle.  Demonstrates understanding / competency    Patient undertands incorporating physical activity into lifestyle.  Demonstrates understanding / competency    Patient understands using medications safely.  Demonstrates understanding / competency    Patient understands monitoring blood glucose, interpreting and using results  Demonstrates understanding / competency    Patient understands prevention, detection, and treatment of acute complications.  Demonstrates understanding / competency    Patient understands prevention, detection, and treatment of chronic complications.  Demonstrates understanding / competency    Patient understands how to develop strategies to address psychosocial issues.  Demonstrates understanding / competency    Patient understands how to develop strategies to promote health/change behavior.  Needs Review      Outcomes   Expected Outcomes  Demonstrated interest in learning. Expect positive outcomes    Future DMSE  Other (comment)   to coordinate with next MD appointments   Program Status  Not Completed       Individualized Plan for Diabetes Self-Management Training:   Learning Objective:  Patient will have a greater understanding of diabetes self-management. Patient education plan is to attend individual and/or group sessions per assessed needs and concerns.   Plan:   Patient Instructions  You Tube - Nicola Girt easy meals [employer] Podcast Breakfast  burrito Scrambled tofu with peppers, onions, yellow grits, roasted sweet potatoes, fresh fruit Be sure to add protein to each meal (tofu, beans, nuts, nut butter)   Expected Outcomes:  Demonstrated interest in learning. Expect positive outcomes  If problems or questions, patient to contact team via:  Phone  Future DSME appointment: Other (comment)(to coordinate with next MD appointments)

## 2020-04-12 NOTE — Patient Instructions (Addendum)
You Tube - Nicola Girt easy meals [employer] Podcast Breakfast burrito Scrambled tofu with peppers, onions, yellow grits, roasted sweet potatoes, fresh fruit Be sure to add protein to each meal (tofu, beans, nuts, nut butter)

## 2020-04-16 NOTE — Progress Notes (Addendum)
[name], female   DOB: 03/29/1976, 44 y.o.   MRN: [id number]  I connected with the above-named patient by video enabled telemedicine application and verified that I am speaking with the correct person. The patient was explained the limitations of evaluation and management by telemedicine and the availability of in person appointments.  Patient also understood that there may be a patient responsible charge related to this service . Location of the patient: Patient's home . Location of the provider: Physician office Only the patient and myself were participating in the encounter The patient understood the above statements and agreed to proceed.   Reason for Appointment: Diabetes follow-up  History of Present Illness   Diagnosis: date of diagnosis: 2009, initially asymptomatic, has type 2 diabetes.   RECENT history:     A1c is  better at 6.2  7, previously was at 7.1  Current treatment regimen:  OMNIPOD insulin pump Basal rates: 0. 45 until 5 AM, 5 AM- 6 PM = 0.35, 6 PM-12 AM = 0. 45  I/C 12, ISF: 60, Target: 110, with correction over 110.  Total insulin dose recently 21 units/day   Non-insulin hypoglycemic drugs: Farxiga 5 mg daily  Blood sugar patterns, management:  She has an average blood sugar on her freestyle libre of only 118  She thinks that occasionally the freestyle libre may be inaccurate and read falsely low about 10-20 mg especially at the low end  However she thinks it is more accurate when she moves the [place] sensor higher on the upper arm  As discussed below she has only occasional high postprandial readings especially with trying to work with the dietitian  Still can do better with adding protein to meals including lunch  Blood sugars do not reduce excessively with exercise in fact they may start going up a little bit  She thinks that bolusing with Fiasp 5 minutes before the meal agrees with her blood sugar pattern better  than NovoLog and has less hypoglycemia after the bolus also  However occasionally she thinks she may get too much insulin especially if she does not figure out the fiber content of the meal  Blood sugars are likely low normal frequently overnight also  Side effects from medications: Diarrhea from metformin and abdominal discomfort   CONTINUOUS GLUCOSE MONITORING RECORD INTERPRETATION    Dates of Recording: Last 2 weeks  Sensor description: Freestyle libre  Results statistics:   CGM use % of time  89  Average and SD  118+/-36  Time in range      86%  % Time Above 180  6  % Time above 250  2  % Time Below target  6    PRE-MEAL Fasting Lunch Dinner Bedtime Overall  Glucose range:       Mean/median:  104  114  99  128    POST-MEAL PC Breakfast PC Lunch PC Dinner  Glucose range:     Mean/median:  117  130  145    Glycemic patterns summary: Blood sugars are overall very stable except for 3 or 4 episodes of hyperglycemia either after lunch or dinner.  There is more variability in his blood sugars after dinner She tends to have occasional hypoglycemia late afternoon and then also excessive rebound  Hyperglycemic episodes are occurring sporadically after lunch and dinner with about 5 episodes in the last 2 weeks  Hypoglycemic episodes occurred only once overnight and occasionally late afternoon usually transiently  Overnight periods: Blood sugars  are generally very stable and averaging in the low 100 range, blood sugars were below 70 only on one night  Preprandial periods: Blood sugars as above are averaging near reading in normal  Postprandial periods:   After breakfast:   Blood sugars are usually flat, she does not always eat breakfast until sometime at 10 AM After lunch: Average blood sugars are well controlled with significantly fast increase in blood sugar on 3 occasions After dinner: Has alleviated blood sugar spikes about 3 times with blood sugars over 200, highest  274     CGM use % of time  96  Average and SD 111  Time in range    80    %  % Time Above 180  7  % Time above 250   % Time Below target  12    PRE-MEAL Fasting Lunch Dinner Bedtime Overall  Glucose range:       Mean/median:  84  115  102  117  111   POST-MEAL PC Breakfast PC Lunch PC Dinner  Glucose range:     Mean/median:  105  144  134    Glycemic patterns summary: Blood sugars are mostly within the target range except sporadically after lunch and dinner when they are higher and lower below normal around 3 AM or 6 PM Had more variability in the late afternoons    Meals: 3 meals per day, vegetarian, tries to get protein like nuts, [name] yogurt, soy, fish  Dinner usually at 7-8 pm.Cheese sandwiich, or vegetable soup with cheese, may have 1-2 starches per meal Sometimes will have  peanut butter sandwich for lunch  Physical activity:mostly walking stairs, upto 6,000 steps per day  Dietician visit: Most recent: 03/2013  CDE visit: 10/15    Wt Readings from Last 3 Encounters:  04/12/20 150 lb (68 kg)  02/23/20 149 lb (67.6 kg)  01/17/20 151 lb 3.2 oz (68.6 kg)    LABS:  Lab Results  Component Value Date   HGBA1C 6.2 04/12/2020   HGBA1C 6.7 (H) 01/08/2020   HGBA1C 7.1 (H) 10/03/2019   Lab Results  Component Value Date   MICROALBUR 1.0 04/12/2020   LDLCALC 57 04/12/2020   CREATININE 0.76 04/12/2020      Lab on 04/12/2020  Component Date Value Ref Range Status  . Cholesterol 04/12/2020 148  0 - 200 mg/dL Final   ATP III Classification       Desirable:  < 200 mg/dL               Borderline High:  200 - 239 mg/dL          High:  > = 240 mg/dL  . Triglycerides 04/12/2020 52.0  0.0 - 149.0 mg/dL Final   Normal:  <150 mg/dLBorderline High:  150 - 199 mg/dL  . HDL 04/12/2020 80.70  >39.00 mg/dL Final  . VLDL 04/12/2020 10.4  0.0 - 40.0 mg/dL Final  . LDL Cholesterol 04/12/2020 57  0 - 99 mg/dL Final  . Total CHOL/HDL Ratio 04/12/2020 2   Final                   Men          Women1/2 Average Risk     3.4          3.3Average Risk          5.0          4.42X Average Risk  9.6          7.13X Average Risk          15.0          11.0                      . NonHDL 04/12/2020 67.16   Final   NOTE:  Non-HDL goal should be 30 mg/dL higher than patient's LDL goal (i.e. LDL goal of < 70 mg/dL, would have non-HDL goal of < 100 mg/dL)  . Sodium 04/12/2020 138  135 - 145 mEq/L Final  . Potassium 04/12/2020 4.4  3.5 - 5.1 mEq/L Final  . Chloride 04/12/2020 102  96 - 112 mEq/L Final  . CO2 04/12/2020 29  19 - 32 mEq/L Final  . Glucose, Bld 04/12/2020 72  70 - 99 mg/dL Final  . BUN 04/12/2020 9  6 - 23 mg/dL Final  . Creatinine, Ser 04/12/2020 0.76  0.40 - 1.20 mg/dL Final  . Total Bilirubin 04/12/2020 1.4* 0.2 - 1.2 mg/dL Final  . Alkaline Phosphatase 04/12/2020 95  39 - 117 U/L Final  . AST 04/12/2020 27  0 - 37 U/L Final  . ALT 04/12/2020 22  0 - 35 U/L Final  . Total Protein 04/12/2020 7.1  6.0 - 8.3 g/dL Final  . Albumin 04/12/2020 4.3  3.5 - 5.2 g/dL Final  . GFR 04/12/2020 99.21  >60.00 mL/min Final  . Calcium 04/12/2020 9.8  8.4 - 10.5 mg/dL Final  . Hgb A1c MFr Bld 04/12/2020 6.2  4.6 - 6.5 % Final   Glycemic Control Guidelines for People with Diabetes:Non Diabetic:  <6%Goal of Therapy: <7%Additional Action Suggested:  >8%   . Microalb, Ur 04/12/2020 1.0  0.0 - 1.9 mg/dL Final  . Creatinine,U 04/12/2020 111.6  mg/dL Final  . Microalb Creat Ratio 04/12/2020 0.9  0.0 - 30.0 mg/g Final    Allergies as of 04/17/2020      Reactions   Other Anaphylaxis   Peaches , tomatoes, strawberry   Strawberry Extract Hives   Facial hives and mouth hives   Tomato Hives   Facial hives and mouth hives      Medication List       Accurate as of Apr 16, 2020  4:42 PM. If you have any questions, ask your nurse or doctor.        DULoxetine 60 MG capsule Commonly known as: CYMBALTA Take 60 mg by mouth daily.   Farxiga 5 MG Tabs tablet Generic drug:  dapagliflozin propanediol TAKE 5 MG BY MOUTH DAILY.   Fiasp 100 UNIT/ML Soln Generic drug: Insulin Aspart (w/Niacinamide) Inject 30 Units into the skin daily. Use max of 30 units daily via insulin pump   FreeStyle Libre 2 Sensor Misc 1 UNITS BY DOES NOT APPLY ROUTE ONCE FOR 1 DOSE.   multivitamin with minerals Tabs tablet Take 1 tablet by mouth daily.   OmniPod Dash 5 Pack Pods Misc CHANGE 1 POD EVERY 72 HOURS AS DIRECTED   OneTouch Delica Plus [id number] Misc 1 each by Does not apply route See admin instructions. Use Onetouch Delica Plus lancets to check blood sugar before and after each meal.   OneTouch Verio Flex System w/Device Kit Use Onetouch verio flex meter to check blood sugar before and after each meal.   OneTouch Verio test strip Generic drug: glucose blood USE ONETOUCH VERIO FLEX AS INSTRUCTED TO CHECK BLOOD SUGAR BEFORE AND AFTER EACH MEAL.  Allergies:  Allergies  Allergen Reactions  . Other Anaphylaxis    Peaches , tomatoes, strawberry  . Strawberry Extract Hives    Facial hives and mouth hives  . Tomato Hives    Facial hives and mouth hives    Past Medical History:  Diagnosis Date  . Back pain   . Diabetes mellitus   . Fibroids   . PCOS (polycystic ovarian syndrome)     Past Surgical History:  Procedure Laterality Date  . BREAST EXCISIONAL BIOPSY Right   . BREAST SURGERY     benign  . IR ANGIOGRAM PELVIS SELECTIVE OR SUPRASELECTIVE  11/17/2018  . IR ANGIOGRAM PELVIS SELECTIVE OR SUPRASELECTIVE  11/17/2018  . IR ANGIOGRAM SELECTIVE EACH ADDITIONAL VESSEL  11/17/2018  . IR ANGIOGRAM SELECTIVE EACH ADDITIONAL VESSEL  11/17/2018  . IR EMBO TUMOR ORGAN ISCHEMIA INFARCT INC GUIDE ROADMAPPING  11/17/2018  . IR RADIOLOGIST EVAL & MGMT  09/14/2018  . IR RADIOLOGIST EVAL & MGMT  12/13/2018  . IR RADIOLOGIST EVAL & MGMT  06/28/2019  . IR US GUIDE VASC ACCESS RIGHT  11/17/2018  . UTERINE FIBROID SURGERY      Family History  Problem Relation Age of  Onset  . Hyperlipidemia Other   . Hypertension Other   . Stroke Other   . Cancer Other   . Diabetes Maternal Uncle   . Heart disease Paternal Uncle   . Diabetes Maternal Grandmother   . Heart disease Maternal Grandmother   . Breast cancer Maternal Grandmother        diagnosed in her 60's  . Diabetes Paternal Grandmother     Social History:  reports that she has never smoked. She has never used smokeless tobacco. She reports that she does not drink alcohol or use drugs.  Review of Systems -   No further GI symptoms since her colonoscopy and also she is changing her diet  Has high LDL at times,  Baseline LDL was only 100 and PCP has prescribed her on Lipitor 10 mg Recent labs show excellent results  Lab Results  Component Value Date   CHOL 148 04/12/2020   HDL 80.70 04/12/2020   LDLCALC 57 04/12/2020   TRIG 52.0 04/12/2020   CHOLHDL 2 04/12/2020   No history of hypertension  She has had both Covid vaccine injections  She has postponed her eye exam    Examination:   There were no vitals taken for this visit.  There is no height or weight on file to calculate BMI.    Assesment:   DIABETES type 2:  See history of present illness for detailed discussion of  current management, blood sugar patterns and problems identified  Her A1c has gone down further and now only 6.2  She is on the Omni pod insulin pump and Farxiga  Her blood sugar control is generally better with trying to work on her diet, use of Fiasp insulin, some exercise and consistently bolusing before starting to eat  [employer] still not consistently accurate and she thinks it is not truly reflecting low blood sugars also However because of tendency to the lowest readings and some hypoglycemia late afternoon she will need to lower her basal rate after about 2 PM Also as a precaution lower the overnight basal rate  Recommendations today: She will continue working on avoiding foods that are high  glycemic index Need to make sure she has protein consistently at each meal especially lunchtime Avoid overtreating low sugars and use glucose tablets for  low sugars Continue Fiasp She will make sure she modifies the basal insulins as follows Midnight = 0.35.  2 PM-5 PM = 0.30 Continue follow-up with dietitian if needed  She will reschedule the eye exam that she has planned Foot exam on the next visit in the office  Stay on atorvastatin as before  Follow-up in 3 months unless blood sugars are more labile   There are no Patient Instructions on file for this visit.      Elayne Snare 04/16/2020, 4:42 PM   Note: This office note was prepared with Dragon voice recognition system technology. Any transcriptional errors that result from this process are unintentional.

## 2020-04-17 ENCOUNTER — Other Ambulatory Visit: Payer: Self-pay

## 2020-04-17 ENCOUNTER — Encounter: Payer: Self-pay | Admitting: Endocrinology

## 2020-04-17 ENCOUNTER — Telehealth (INDEPENDENT_AMBULATORY_CARE_PROVIDER_SITE_OTHER): Payer: BC Managed Care – PPO | Admitting: Endocrinology

## 2020-04-17 DIAGNOSIS — Z794 Long term (current) use of insulin: Secondary | ICD-10-CM | POA: Diagnosis not present

## 2020-04-17 DIAGNOSIS — E1165 Type 2 diabetes mellitus with hyperglycemia: Secondary | ICD-10-CM

## 2020-04-17 DIAGNOSIS — E7800 Pure hypercholesterolemia, unspecified: Secondary | ICD-10-CM | POA: Diagnosis not present

## 2020-05-18 ENCOUNTER — Other Ambulatory Visit: Payer: Self-pay | Admitting: Endocrinology

## 2020-06-09 ENCOUNTER — Other Ambulatory Visit: Payer: Self-pay | Admitting: Endocrinology

## 2020-07-12 ENCOUNTER — Other Ambulatory Visit: Payer: Self-pay

## 2020-07-12 ENCOUNTER — Other Ambulatory Visit (INDEPENDENT_AMBULATORY_CARE_PROVIDER_SITE_OTHER): Payer: BC Managed Care – PPO

## 2020-07-12 DIAGNOSIS — E1165 Type 2 diabetes mellitus with hyperglycemia: Secondary | ICD-10-CM

## 2020-07-12 DIAGNOSIS — Z794 Long term (current) use of insulin: Secondary | ICD-10-CM | POA: Diagnosis not present

## 2020-07-12 LAB — HEMOGLOBIN A1C: Hgb A1c MFr Bld: 6.9 % — ABNORMAL HIGH (ref 4.6–6.5)

## 2020-07-12 LAB — BASIC METABOLIC PANEL
BUN: 12 mg/dL (ref 6–23)
CO2: 28 mEq/L (ref 19–32)
Calcium: 9.8 mg/dL (ref 8.4–10.5)
Chloride: 103 mEq/L (ref 96–112)
Creatinine, Ser: 0.83 mg/dL (ref 0.40–1.20)
GFR: 89.52 mL/min (ref >60.00)
Glucose, Bld: 127 mg/dL — ABNORMAL HIGH (ref 70–99)
Potassium: 4.4 mEq/L (ref 3.5–5.1)
Sodium: 141 mEq/L (ref 135–145)

## 2020-07-17 ENCOUNTER — Ambulatory Visit: Payer: BC Managed Care – PPO | Admitting: Endocrinology

## 2020-07-21 NOTE — Progress Notes (Signed)
[name], female   DOB: [birth year], 44 y.o.   MRN: [id number]    Reason for Appointment: Diabetes follow-up  History of Present Illness   Diagnosis: date of diagnosis: 2009, initially asymptomatic, has type 2 diabetes.   RECENT history:     A1c is back up to 6.9 compared to 6.2  Current treatment regimen:  OMNIPOD insulin pump  BASAL RATES: Midnight = 0.3, 7 PM = 0.40   Bolus settings: I/C ratio 12, ISF: 60, Target: 110, with correction over 110.   Non-insulin hypoglycemic drugs: Farxiga 5 mg daily  Blood sugar patterns, management:  [name] was analyzed only for the last week or so since she switched to using the app on the phone and prior data I am not available  She thinks that the Elenor Legato is fairly accurate especially when she moves the [name] sensor higher on the upper arm  She has had difficulties adjusting to going back to the office for work with changing the diet, lack of exercise and possibly more stress  She is likely eating smaller meals at lunch which she takes from home and only recently is trying to increase the protein content  Even with this her LOWEST blood sugars are in the mid afternoons  She is adjusting the basal rates on her own somewhat and is taking a little higher basal rate in the evening now compared to before  She is however saying that she does not adequately cover her meals in the evenings because she is eating more frequently instead of just 1 meal  Also occasionally she may be getting repeated boluses since blood sugars are going up and then will finally drop especially when she is more active in the evening at home doing housework  Overnight blood sugars appear to be fairly good  Side effects from medications: Diarrhea from metformin and abdominal discomfort   CONTINUOUS GLUCOSE MONITORING RECORD INTERPRETATION    Dates of Recording: 8/23 through 8/30  Sensor description: Elenor Legato version 2   CGM use %  of time  51  Average and SD   Time in range       82%  % Time Above 180  13  % Time above 250   % Time Below target  5    PRE-MEAL Fasting Lunch Dinner Bedtime Overall  Glucose range:       Mean/median:  121  132  96   132   POST-MEAL PC Breakfast PC Lunch PC Dinner  Glucose range:     Mean/median:  141  99  163    Prior  CGM use % of time  89  Average and SD  118+/-36  Time in range      86%  % Time Above 180  6  % Time above 250  2  % Time Below target  6    PRE-MEAL Fasting Lunch Dinner Bedtime Overall  Glucose range:       Mean/median:  104  114  99  128    POST-MEAL PC Breakfast PC Lunch PC Dinner  Glucose range:     Mean/median:  117  130  145    Glycemic patterns summary: Blood sugars are overall very stable except for 3 or 4 episodes of hyperglycemia either after lunch or dinner.  There is more variability in his blood sugars after dinner She tends to have occasional hypoglycemia late afternoon and then also excessive rebound    Meals: 3 meals per  day, vegetarian, tries to get protein like nuts, [name] yogurt, soy, fish  Dinner usually at 7-8 pm.Cheese sandwiich, or vegetable soup with cheese, may have 1-2 starches per meal Sometimes will have  peanut butter sandwich for lunch  Physical activity:mostly walking stairs, upto 6,000 steps per day  Dietician visit: Most recent: 03/2013  CDE visit: 10/15    Wt Readings from Last 3 Encounters:  07/22/20 148 lb 3.2 oz (67.2 kg)  04/12/20 150 lb (68 kg)  02/23/20 149 lb (67.6 kg)    LABS:  Lab Results  Component Value Date   HGBA1C 6.9 (H) 07/12/2020   HGBA1C 6.2 04/12/2020   HGBA1C 6.7 (H) 01/08/2020   Lab Results  Component Value Date   MICROALBUR 1.0 04/12/2020   LDLCALC 57 04/12/2020   CREATININE 0.83 07/12/2020      No visits with results within 1 Week(s) from this visit.  Latest known visit with results is:  Lab on 07/12/2020  Component Date Value Ref Range Status  . Sodium 07/12/2020  141  135 - 145 mEq/L Final  . Potassium 07/12/2020 4.4  3.5 - 5.1 mEq/L Final  . Chloride 07/12/2020 103  96 - 112 mEq/L Final  . CO2 07/12/2020 28  19 - 32 mEq/L Final  . Glucose, Bld 07/12/2020 127* 70 - 99 mg/dL Final  . BUN 07/12/2020 12  6 - 23 mg/dL Final  . Creatinine, Ser 07/12/2020 0.83  0.40 - 1.20 mg/dL Final  . GFR 07/12/2020 89.52  >60.00 mL/min Final  . Calcium 07/12/2020 9.8  8.4 - 10.5 mg/dL Final  . Hgb A1c MFr Bld 07/12/2020 6.9* 4.6 - 6.5 % Final   Glycemic Control Guidelines for People with Diabetes:Non Diabetic:  <6%Goal of Therapy: <7%Additional Action Suggested:  >8%     Allergies as of 07/22/2020      Reactions   Other Anaphylaxis   Peaches , tomatoes, strawberry   Strawberry Extract Hives   Facial hives and mouth hives   Tomato Hives   Facial hives and mouth hives      Medication List       Accurate as of July 22, 2020  8:59 AM. If you have any questions, ask your nurse or doctor.        atomoxetine 60 MG capsule Commonly known as: STRATTERA Take 60 mg by mouth daily.   atorvastatin 10 MG tablet Commonly known as: LIPITOR Take 10 mg by mouth daily.   DULoxetine 60 MG capsule Commonly known as: CYMBALTA Take 60 mg by mouth daily.   Farxiga 5 MG Tabs tablet Generic drug: dapagliflozin propanediol TAKE 5 MG BY MOUTH DAILY.   Fiasp 100 UNIT/ML Soln Generic drug: Insulin Aspart (w/Niacinamide) INJECT 30 UNITS INTO THE SKIN DAILY. USE MAX OF 30 UNITS DAILY VIA INSULIN PUMP   FreeStyle Libre 2 Sensor Misc USE AS DIRECTED   multivitamin with minerals Tabs tablet Take 1 tablet by mouth daily.   OmniPod Dash 5 Pack Pods Misc CHANGE 1 POD EVERY 72 HOURS AS DIRECTED   OneTouch Delica Plus [id number] Misc 1 each by Does not apply route See admin instructions. Use Onetouch Delica Plus lancets to check blood sugar before and after each meal.   OneTouch Verio Flex System w/Device Kit Use Onetouch verio flex meter to check blood sugar before and  after each meal.   OneTouch Verio test strip Generic drug: glucose blood USE ONETOUCH VERIO FLEX AS INSTRUCTED TO CHECK BLOOD SUGAR BEFORE AND AFTER EACH MEAL.  Allergies:  Allergies  Allergen Reactions  . Other Anaphylaxis    Peaches , tomatoes, strawberry  . Strawberry Extract Hives    Facial hives and mouth hives  . Tomato Hives    Facial hives and mouth hives    Past Medical History:  Diagnosis Date  . Back pain   . Diabetes mellitus   . Fibroids   . PCOS (polycystic ovarian syndrome)     Past Surgical History:  Procedure Laterality Date  . BREAST EXCISIONAL BIOPSY Right   . BREAST SURGERY     benign  . IR ANGIOGRAM PELVIS SELECTIVE OR SUPRASELECTIVE  11/17/2018  . IR ANGIOGRAM PELVIS SELECTIVE OR SUPRASELECTIVE  11/17/2018  . IR ANGIOGRAM SELECTIVE EACH ADDITIONAL VESSEL  11/17/2018  . IR ANGIOGRAM SELECTIVE EACH ADDITIONAL VESSEL  11/17/2018  . IR EMBO TUMOR ORGAN ISCHEMIA INFARCT INC GUIDE ROADMAPPING  11/17/2018  . IR RADIOLOGIST EVAL & MGMT  09/14/2018  . IR RADIOLOGIST EVAL & MGMT  12/13/2018  . IR RADIOLOGIST EVAL & MGMT  06/28/2019  . IR US GUIDE VASC ACCESS RIGHT  11/17/2018  . UTERINE FIBROID SURGERY      Family History  Problem Relation Age of Onset  . Hyperlipidemia Other   . Hypertension Other   . Stroke Other   . Cancer Other   . Diabetes Maternal Uncle   . Heart disease Paternal Uncle   . Diabetes Maternal Grandmother   . Heart disease Maternal Grandmother   . Breast cancer Maternal Grandmother        diagnosed in her 52's  . Diabetes Paternal Grandmother     Social History:  reports that she has never smoked. She has never used smokeless tobacco. She reports that she does not drink alcohol and does not use drugs.  Review of Systems -  Has high LDL at times,  Baseline LDL was only 100 and PCP has prescribed her on Lipitor 10 mg Last labs show excellent results  Lab Results  Component Value Date   CHOL 148 04/12/2020   HDL  80.70 04/12/2020   LDLCALC 57 04/12/2020   TRIG 52.0 04/12/2020   CHOLHDL 2 04/12/2020   No history of hypertension  She has had both Covid vaccine injections, last in 5/21     Examination:   BP 118/70 (BP Location: Left Arm, Patient Position: Sitting, Cuff Size: Normal)   Pulse 72   Ht _0  (1.651 m)   Wt 148 lb 3.2 oz (67.2 kg)   BMI 24.66 kg/m   Body mass index is 24.66 kg/m.    Assesment:   DIABETES type 2:  See history of present illness for detailed discussion of  current management, blood sugar patterns and problems identified Her CGM data was analyzed on download  Her A1c has gone up to 6.9  She is on the Omni pod insulin pump using Fiasp and Farxiga  Blood sugar data is incomplete and only the last few days information is available from her meter and CGM Pump was not downloaded because of technical difficulties with her pump although she has some data on the freestyle libre, some information was available on the app on the phone  No consistent hyperglycemia seen recently but she tends to have lower readings in the afternoons fairly often Hypoglycemia is mostly related to overestimating her boluses or occasionally overcorrection  Freestyle libre still not consistently accurate and she thinks it is not truly reflecting low blood sugars also However because of tendency to the lowest  readings and some hypoglycemia late afternoon she will need to lower her basal rate after about 2 PM Also as a precaution lower the overnight basal rate  Recommendations today:  Avoid overtreating low sugars and use glucose tablets and not food   Continue Fiasp  She will make sure she has protein at each meal especially lunchtime  Carbohydrate coverage only 1: 14 at lunch  Consider midafternoon snack to avoid excessive hunger in the evening  May also consider reducing basal rate between 3-5 PM further  Avoid stacking boluses in the evening  She will try to get a new PDM to  enable upload and also try to upload at home prior to visit   Foot exam on the next visit in the office  Regular follow-up of lipid panels Follow-up in 3 months   Patient Instructions  Carb ratio 1:14 at 11am to 3 pm  Add protein at lunch and mid afternoon snack if needed       Mia Wilson 07/22/2020, 8:59 AM   Note: This office note was prepared with Dragon voice recognition system technology. Any transcriptional errors that result from this process are unintentional.

## 2020-07-22 ENCOUNTER — Ambulatory Visit: Payer: BC Managed Care – PPO | Admitting: Endocrinology

## 2020-07-22 ENCOUNTER — Other Ambulatory Visit: Payer: Self-pay

## 2020-07-22 ENCOUNTER — Encounter: Payer: Self-pay | Admitting: Endocrinology

## 2020-07-22 VITALS — BP 118/70 | HR 72 | Ht 65.0 in | Wt 148.2 lb

## 2020-07-22 DIAGNOSIS — E1165 Type 2 diabetes mellitus with hyperglycemia: Secondary | ICD-10-CM

## 2020-07-22 DIAGNOSIS — Z794 Long term (current) use of insulin: Secondary | ICD-10-CM | POA: Diagnosis not present

## 2020-07-22 DIAGNOSIS — E7800 Pure hypercholesterolemia, unspecified: Secondary | ICD-10-CM

## 2020-07-22 LAB — HM DIABETES EYE EXAM

## 2020-07-22 NOTE — Patient Instructions (Signed)
Carb ratio 1:14 at 11am to 3 pm  Add protein at lunch and mid afternoon snack if needed

## 2020-08-04 ENCOUNTER — Other Ambulatory Visit: Payer: Self-pay | Admitting: Endocrinology

## 2020-09-11 ENCOUNTER — Other Ambulatory Visit: Payer: Self-pay | Admitting: Endocrinology

## 2020-09-24 ENCOUNTER — Other Ambulatory Visit: Payer: Self-pay | Admitting: Physician Assistant

## 2020-09-24 DIAGNOSIS — Z1231 Encounter for screening mammogram for malignant neoplasm of breast: Secondary | ICD-10-CM

## 2020-09-25 ENCOUNTER — Encounter (HOSPITAL_COMMUNITY): Payer: Self-pay | Admitting: Emergency Medicine

## 2020-09-25 ENCOUNTER — Emergency Department (HOSPITAL_COMMUNITY): Payer: BC Managed Care – PPO

## 2020-09-25 ENCOUNTER — Emergency Department (HOSPITAL_COMMUNITY)
Admission: EM | Admit: 2020-09-25 | Discharge: 2020-09-25 | Disposition: A | Discharge status: Home / Self Care | Payer: BC Managed Care – PPO | Attending: Emergency Medicine | Admitting: Emergency Medicine

## 2020-09-25 ENCOUNTER — Other Ambulatory Visit: Payer: Self-pay

## 2020-09-25 DIAGNOSIS — E119 Type 2 diabetes mellitus without complications: Secondary | ICD-10-CM | POA: Diagnosis not present

## 2020-09-25 DIAGNOSIS — R112 Nausea with vomiting, unspecified: Secondary | ICD-10-CM | POA: Diagnosis not present

## 2020-09-25 DIAGNOSIS — Z794 Long term (current) use of insulin: Secondary | ICD-10-CM | POA: Diagnosis not present

## 2020-09-25 DIAGNOSIS — R55 Syncope and collapse: Secondary | ICD-10-CM | POA: Diagnosis not present

## 2020-09-25 DIAGNOSIS — R197 Diarrhea, unspecified: Secondary | ICD-10-CM | POA: Diagnosis present

## 2020-09-25 DIAGNOSIS — R1030 Lower abdominal pain, unspecified: Secondary | ICD-10-CM | POA: Diagnosis not present

## 2020-09-25 LAB — BASIC METABOLIC PANEL
Anion gap: 13 (ref 5–15)
BUN: 18 mg/dL (ref 6–20)
CO2: 22 mmol/L (ref 22–32)
Calcium: 9.9 mg/dL (ref 8.9–10.3)
Chloride: 104 mmol/L (ref 98–111)
Creatinine, Ser: 0.86 mg/dL (ref 0.44–1.00)
GFR, Estimated: >60 mL/min (ref >60)
Glucose, Bld: 177 mg/dL — ABNORMAL HIGH (ref 70–99)
Potassium: 4 mmol/L (ref 3.5–5.1)
Sodium: 139 mmol/L (ref 135–145)

## 2020-09-25 LAB — LIPASE, BLOOD: Lipase: 24 U/L (ref 11–51)

## 2020-09-25 LAB — CBC
HCT: 49.2 % — ABNORMAL HIGH (ref 36.0–46.0)
Hemoglobin: 14.5 g/dL (ref 12.0–15.0)
MCH: 26.6 pg (ref 26.0–34.0)
MCHC: 29.5 g/dL — ABNORMAL LOW (ref 30.0–36.0)
MCV: 90.3 fL (ref 80.0–100.0)
Platelets: 259 10*3/uL (ref 150–400)
RBC: 5.45 MIL/uL — ABNORMAL HIGH (ref 3.87–5.11)
RDW: 14.6 % (ref 11.5–15.5)
WBC: 9.7 10*3/uL (ref 4.0–10.5)
nRBC: 0 % (ref 0.0–0.2)

## 2020-09-25 LAB — HEPATIC FUNCTION PANEL
ALT: 25 U/L (ref 0–44)
AST: 29 U/L (ref 15–41)
Albumin: 4.3 g/dL (ref 3.5–5.0)
Alkaline Phosphatase: 104 U/L (ref 38–126)
Bilirubin, Direct: 0.2 mg/dL (ref 0.0–0.2)
Indirect Bilirubin: 0.7 mg/dL (ref 0.3–0.9)
Total Bilirubin: 0.9 mg/dL (ref 0.3–1.2)
Total Protein: 7.7 g/dL (ref 6.5–8.1)

## 2020-09-25 LAB — URINALYSIS, ROUTINE W REFLEX MICROSCOPIC
Bacteria, UA: NONE SEEN
Bilirubin Urine: NEGATIVE
Glucose, UA: >=500 mg/dL — AB
Hgb urine dipstick: NEGATIVE
Ketones, ur: 80 mg/dL — AB
Leukocytes,Ua: NEGATIVE
Nitrite: NEGATIVE
Protein, ur: NEGATIVE mg/dL
Specific Gravity, Urine: 1.042 — ABNORMAL HIGH (ref 1.005–1.030)
pH: 5 (ref 5.0–8.0)

## 2020-09-25 LAB — I-STAT BETA HCG BLOOD, ED (MC, WL, AP ONLY): I-stat hCG, quantitative: <5 m[IU]/mL (ref <5)

## 2020-09-25 LAB — TROPONIN I (HIGH SENSITIVITY): Troponin I (High Sensitivity): 4 ng/L (ref <18)

## 2020-09-25 MED ORDER — ONDANSETRON HCL 4 MG/2ML IJ SOLN
4.0000 mg | Freq: Once | INTRAMUSCULAR | Status: AC
  Administered 2020-09-25: 4 mg via INTRAVENOUS
  Filled 2020-09-25: qty 2

## 2020-09-25 MED ORDER — ONDANSETRON 4 MG PO TBDP: 4.0000 mg | ORAL_TABLET | Freq: Three times a day (TID) | ORAL | 0 refills | Status: DC | PRN

## 2020-09-25 MED ORDER — SODIUM CHLORIDE 0.9 % IV BOLUS
1000.0000 mL | Freq: Once | INTRAVENOUS | Status: AC
  Administered 2020-09-25: 1000 mL via INTRAVENOUS

## 2020-09-25 NOTE — ED Triage Notes (Signed)
Arrived via EMS for syncope, emesis, and diarrhea. States did have left sided chest pain 2 weeks ago intermittently. States has pain under left arm 2/10 sore. States thought might be heartburn. Ax4

## 2020-09-25 NOTE — Discharge Instructions (Signed)
Home to rest, push hydrating fluids such as Gatorade.  Use Zofran as needed as prescribed. Follow-up with your primary care provider this week.  Return to ER for worsening or concerning symptoms.

## 2020-09-25 NOTE — ED Provider Notes (Signed)
[facility] EMERGENCY DEPARTMENT Provider Note   CSN: [id number] Arrival date & time: 09/25/20  1343     History Chief Complaint  Patient presents with  . Loss of Consciousness  . Emesis  . Diarrhea    Mia Wilson is a 44 y.o. female.  44 year old female with history of insulin dependent diabetes, hyperlipidemia, complaint of diarrhea and vomiting. Reports lower abdominal discomfort onset last night.  Diarrhea described as profuse, watery, onset this morning upon waking. Patient continued to feel somewhat unwell and nauseous as the day went on. Patient was on a tele health appointment while sitting in her car this afternoon when she felt suddenly unwell and checked her blood sugar, remembers commenting to her provider that her blood sugar was 188 and then passed out. Patient woke up within a few minutes (time unknown), had vomited on herself, provider was still on the phone calling her name. Patient's husband picked her up and took her home to get cleaned up. Patient states when she arrived home she proceeded to have large amounts or vomiting and diarrhea. Patient went to urgent care where EMS was called due to possible syncopal event earlier today. Patient reports feeling somewhat improved at this time, still slightly nauseous. No suspicious food intake yesterday, no known sick contacts, no one else at home sick.  Travel to the beach last week, is vaccinated against COVID. Reports occasional palpitations for the past few months which she has attributed to anxiety. States she has had left side chest discomfort for the past week or so, intermittent, radiates to left axilla, no pain today.         Past Medical History:  Diagnosis Date  . Back pain   . Diabetes mellitus   . Fibroids   . PCOS (polycystic ovarian syndrome)     Patient Active Problem List   Diagnosis Date Noted  . Uterine fibroid 11/17/2018  . Fibroids 11/17/2018  . Pure hypercholesterolemia  04/02/2015  . Type II diabetes mellitus, uncontrolled (HCC) 06/20/2013  . PCOS (polycystic ovarian syndrome)     Past Surgical History:  Procedure Laterality Date  . BREAST EXCISIONAL BIOPSY Right   . BREAST SURGERY     benign  . IR ANGIOGRAM PELVIS SELECTIVE OR SUPRASELECTIVE  11/17/2018  . IR ANGIOGRAM PELVIS SELECTIVE OR SUPRASELECTIVE  11/17/2018  . IR ANGIOGRAM SELECTIVE EACH ADDITIONAL VESSEL  11/17/2018  . IR ANGIOGRAM SELECTIVE EACH ADDITIONAL VESSEL  11/17/2018  . IR EMBO TUMOR ORGAN ISCHEMIA INFARCT INC GUIDE ROADMAPPING  11/17/2018  . IR RADIOLOGIST EVAL & MGMT  09/14/2018  . IR RADIOLOGIST EVAL & MGMT  12/13/2018  . IR RADIOLOGIST EVAL & MGMT  06/28/2019  . IR US GUIDE VASC ACCESS RIGHT  11/17/2018  . UTERINE FIBROID SURGERY       OB History   No obstetric history on file.     Family History  Problem Relation Age of Onset  . Hyperlipidemia Other   . Hypertension Other   . Stroke Other   . Cancer Other   . Diabetes Maternal Uncle   . Heart disease Paternal Uncle   . Diabetes Maternal Grandmother   . Heart disease Maternal Grandmother   . Breast cancer Maternal Grandmother        diagnosed in her 18's  . Diabetes Paternal Grandmother     Social History   Tobacco Use  . Smoking status: Never Smoker  . Smokeless tobacco: Never Used  Substance Use Topics  .  Alcohol use: No  . Drug use: No    Home Medications Prior to Admission medications   Medication Sig Start Date End Date Taking? Authorizing Provider  atomoxetine (STRATTERA) 60 MG capsule Take 60 mg by mouth daily.    [provider]  atorvastatin (LIPITOR) 10 MG tablet Take 10 mg by mouth daily.    [provider]  Blood Glucose Monitoring Suppl (ONETOUCH VERIO FLEX SYSTEM) w/Device KIT Use Onetouch verio flex meter to check blood sugar before and after each meal. 05/25/19   Elayne Snare, MD  Continuous Blood Gluc Sensor (FREESTYLE LIBRE 2 SENSOR) MISC USE AS DIRECTED 06/10/20   Elayne Snare, MD  DULoxetine (CYMBALTA) 60 MG capsule Take 60 mg by mouth daily.    [provider]  FARXIGA 5 MG TABS tablet TAKE 5 MG BY MOUTH DAILY. 08/05/20   Elayne Snare, MD  FIASP 100 UNIT/ML SOLN INJECT 30 UNITS INTO THE SKIN DAILY. USE MAX OF 30 UNITS DAILY VIA INSULIN PUMP 05/20/20   Elayne Snare, MD  Insulin Disposable Pump (OMNIPOD DASH 5 PACK PODS) MISC CHANGE 1 POD EVERY 72 HOURS AS DIRECTED 09/11/20   Elayne Snare, MD  Lancets [facility] DELICA PLUS [id number]) [place] 1 each by Does not apply route See admin instructions. Use Onetouch Delica Plus lancets to check blood sugar before and after each meal. 05/25/19   Elayne Snare, MD  Multiple Vitamin (MULTIVITAMIN WITH MINERALS) TABS Take 1 tablet by mouth daily.    [provider]  ondansetron (ZOFRAN ODT) 4 MG disintegrating tablet Take 1 tablet (4 mg total) by mouth every 8 (eight) hours as needed for nausea or vomiting. 09/25/20   Tacy Learn, PA-C  ONETOUCH VERIO test strip USE ONETOUCH VERIO FLEX AS INSTRUCTED TO CHECK BLOOD SUGAR BEFORE AND AFTER EACH MEAL. 11/27/19   Elayne Snare, MD    Allergies    Other, Strawberry extract, and Tomato  Review of Systems   Review of Systems  Constitutional: Negative for fever.  Eyes: Negative for visual disturbance.  Respiratory: Negative for shortness of breath.   Cardiovascular: Negative for chest pain.  Gastrointestinal: Positive for abdominal pain, diarrhea, nausea and vomiting. Negative for blood in stool and constipation.  Genitourinary: Negative for dysuria and frequency.  Musculoskeletal: Negative for arthralgias and myalgias.  Skin: Negative for rash and wound.  Allergic/Immunologic: Positive for immunocompromised state.  Neurological: Positive for syncope. Negative for speech difficulty and weakness.  Psychiatric/Behavioral: Negative for confusion.  All other systems reviewed and are negative.   Physical Exam Updated Vital Signs BP (!) 106/53   Pulse 76   Temp 97.9 F  (36.6 C)   Resp (!) 23   Ht $R'5\' 5"'hl$  (1.651 m)   Wt 66.7 kg   SpO2 100%   BMI 24.46 kg/m   Physical Exam Vitals and nursing note reviewed.  Constitutional:      General: She is not in acute distress.    Appearance: She is well-developed. She is not diaphoretic.  HENT:     Head: Normocephalic and atraumatic.     Mouth/Throat:     Mouth: Mucous membranes are moist.  Eyes:     Conjunctiva/sclera: Conjunctivae normal.  Cardiovascular:     Rate and Rhythm: Normal rate and regular rhythm.     Pulses: Normal pulses.     Heart sounds: Normal heart sounds.  Pulmonary:     Effort: Pulmonary effort is normal.     Breath sounds: Normal breath sounds.  Abdominal:  Palpations: Abdomen is soft.     Tenderness: There is no abdominal tenderness.  Musculoskeletal:     Right lower leg: No edema.     Left lower leg: No edema.  Skin:    General: Skin is warm and dry.     Findings: No erythema or rash.  Neurological:     Mental Status: She is alert and oriented to person, place, and time.  Psychiatric:        Behavior: Behavior normal.     ED Results / Procedures / Treatments   Labs (all labs ordered are listed, but only abnormal results are displayed) Labs Reviewed  BASIC METABOLIC PANEL - Abnormal; Notable for the following components:      Result Value   Glucose, Bld 177 (*)    All other components within normal limits  CBC - Abnormal; Notable for the following components:   RBC 5.45 (*)    HCT 49.2 (*)    MCHC 29.5 (*)    All other components within normal limits  URINALYSIS, ROUTINE W REFLEX MICROSCOPIC - Abnormal; Notable for the following components:   Specific Gravity, Urine 1.042 (*)    Glucose, UA >=500 (*)    Ketones, ur 80 (*)    All other components within normal limits  HEPATIC FUNCTION PANEL  LIPASE, BLOOD  CBG MONITORING, ED  I-STAT BETA HCG BLOOD, ED ([facility], [facility], [facility] ONLY)  TROPONIN I (HIGH SENSITIVITY)  TROPONIN I (HIGH SENSITIVITY)    EKG EKG  Interpretation  Date/Time:  Wednesday September 25 2020 13:56:10 EDT Ventricular Rate:  76 PR Interval:  130 QRS Duration: 84 QT Interval:  400 QTC Calculation: 450 R Axis:   95 Text Interpretation: Suspect arm lead reversal, interpretation assumes no reversal Normal sinus rhythm Right atrial enlargement Rightward axis Pulmonary disease pattern Nonspecific T wave abnormality Abnormal ECG Confirmed by Davonna Belling [phone]) on 09/25/2020 5:10:02 PM  Radiology DG Chest 2 View  Result Date: 09/25/2020 CLINICAL DATA:  Chest pain. EXAM: CHEST - 2 VIEW COMPARISON:  None. FINDINGS: The heart size and mediastinal contours are within normal limits. Both lungs are clear. No pleural effusions or pneumothorax. The visualized skeletal structures are unremarkable. IMPRESSION: No active cardiopulmonary disease. Electronically Signed   By: Margaretha Sheffield MD   On: 09/25/2020 14:35    Procedures Procedures (including critical care time)  Medications Ordered in ED Medications  sodium chloride 0.9 % bolus 1,000 mL (0 mLs Intravenous Stopped 09/25/20 2114)  ondansetron (ZOFRAN) injection 4 mg (4 mg Intravenous Given 09/25/20 1757)    ED Course  I have reviewed the triage vital signs and the nursing notes.  Pertinent labs & imaging results that were available during my care of the patient were reviewed by me and considered in my medical decision making (see chart for details).  Clinical Course as of Sep 26 2131  Wed Sep 25, 4092  3756 44 year old female presents to the ER with vomiting and diarrhea and question of syncopal episode today as documented above.  On exam, patient is alert, well-appearing.  Abdomen is soft and nontender.  Labs reviewed, CBC without significant findings, BMP with hyperglycemia with glucose of 177, add on hepatic function panel is within normal limits.  hCG is negative, lipase within normal limits.  Troponin is 4.  Urinalysis with glucose and ketones. EKG without acute  ischemic changes. Chest x-ray is unremarkable. Due to sudden onset of vomiting and diarrhea, suspect possible foodborne illness. Patient was given IV fluids and Zofran,  symptoms have improved and she feels ready for discharge at this time.  Plan is to discharge with prescription for Zofran ODT, home to rest and recommend hydrating fluids.  Patient should follow-up with her PCP later this week for recheck and to discuss her syncopal episode. As discussed with Dr. Alvino Chapel, ER attending, agrees with plan of care.   [LM]    Clinical Course User Index [LM] Roque Lias   MDM Rules/Calculators/A&P                          Final Clinical Impression(s) / ED Diagnoses Final diagnoses:  Nausea vomiting and diarrhea  Syncope, unspecified syncope type    Rx / DC Orders ED Discharge Orders         Ordered    ondansetron (ZOFRAN ODT) 4 MG disintegrating tablet  Every 8 hours PRN        09/25/20 2101           Tacy Learn, PA-C 09/25/20 2132    Davonna Belling, MD 09/26/20 [phone]

## 2020-09-26 DIAGNOSIS — Z1231 Encounter for screening mammogram for malignant neoplasm of breast: Secondary | ICD-10-CM

## 2020-10-09 ENCOUNTER — Other Ambulatory Visit: Payer: Self-pay | Admitting: Endocrinology

## 2020-10-18 ENCOUNTER — Other Ambulatory Visit: Payer: BC Managed Care – PPO

## 2020-10-21 ENCOUNTER — Other Ambulatory Visit: Payer: Self-pay

## 2020-10-21 ENCOUNTER — Other Ambulatory Visit (INDEPENDENT_AMBULATORY_CARE_PROVIDER_SITE_OTHER): Payer: BC Managed Care – PPO

## 2020-10-21 DIAGNOSIS — Z794 Long term (current) use of insulin: Secondary | ICD-10-CM

## 2020-10-21 DIAGNOSIS — E1165 Type 2 diabetes mellitus with hyperglycemia: Secondary | ICD-10-CM | POA: Diagnosis not present

## 2020-10-21 DIAGNOSIS — E7800 Pure hypercholesterolemia, unspecified: Secondary | ICD-10-CM

## 2020-10-21 LAB — COMPREHENSIVE METABOLIC PANEL
ALT: 20 U/L (ref 0–35)
AST: 26 U/L (ref 0–37)
Albumin: 3.8 g/dL (ref 3.5–5.2)
Alkaline Phosphatase: 81 U/L (ref 39–117)
BUN: 13 mg/dL (ref 6–23)
CO2: 29 mEq/L (ref 19–32)
Calcium: 8.8 mg/dL (ref 8.4–10.5)
Chloride: 104 mEq/L (ref 96–112)
Creatinine, Ser: 0.68 mg/dL (ref 0.40–1.20)
GFR: 104.5 mL/min (ref >60.00)
Glucose, Bld: 143 mg/dL — ABNORMAL HIGH (ref 70–99)
Potassium: 3.8 mEq/L (ref 3.5–5.1)
Sodium: 138 mEq/L (ref 135–145)
Total Bilirubin: 0.6 mg/dL (ref 0.2–1.2)
Total Protein: 6.6 g/dL (ref 6.0–8.3)

## 2020-10-21 LAB — LIPID PANEL
Cholesterol: 164 mg/dL (ref 0–200)
HDL: 77.7 mg/dL (ref >39.00)
LDL Cholesterol: 76 mg/dL (ref 0–99)
NonHDL: 86.74
Total CHOL/HDL Ratio: 2
Triglycerides: 54 mg/dL (ref 0.0–149.0)
VLDL: 10.8 mg/dL (ref 0.0–40.0)

## 2020-10-21 LAB — HEMOGLOBIN A1C: Hgb A1c MFr Bld: 6.8 % — ABNORMAL HIGH (ref 4.6–6.5)

## 2020-10-22 NOTE — Progress Notes (Signed)
[name], female   DOB: Nov 11, 1976, 44 y.o.   MRN: [id number]    Reason for Appointment: Diabetes follow-up  History of Present Illness   Diagnosis: date of diagnosis: 2009, initially asymptomatic, has type 2 diabetes.   RECENT history:   Current treatment regimen:  OMNIPOD insulin pump  BASAL RATES: Midnight = 0.3, 7 PM = 0.40   Bolus settings: I/C ratio 12 at breakfast and lunch and 1: 14 at dinner, ISF: 60, Target: 110, with correction over 110.   Non-insulin hypoglycemic drugs: Farxiga 5 mg daily  A1c is about the same at 6.8 compared to 6.9  Blood sugar patterns, management:  Her insulin pump could not be downloaded and data from her boluses was not evaluated except for the last day  Blood sugar data from the CGM interpretation is as below  She still has tendency to labile postprandial blood sugars not showing any consistent pattern  Blood sugars dinnertime periodically higher were higher last week but she thinks that this could be from a higher fat intake and will cause delayed hyperglycemia  However the last 2 days her blood sugars were over 200 after lunch but 3 to 4 hours later her blood sugars were getting low even with only 0.5 units correction bolus  OVERNIGHT blood sugars are generally within the target range with some variability  Also has significant variability in blood sugars in the afternoons and evenings  Carbohydrate coverage was reduced at lunchtime previously because of tendency to low sugars mid afternoon and her blood sugars are now averaging about 134 compared to 99 previously  In the last 2 weeks has had a little less hypoglycemia, some of this may be unpredictable but also occasionally from overcorrection  Side effects from medications: Diarrhea from metformin and abdominal discomfort  Data from freestyle libre version 2 download for the last 2 weeks as follows:   CGM use % of time  94  Average and SD  141, GV  27  Time in range      76, was 82%  % Time Above 180  18  % Time above 250  3  % Time Below target  3     PRE-MEAL Fasting Lunch Dinner Bedtime Overall  Glucose range:       Mean/median:  121  141  130   141   POST-MEAL PC Breakfast PC Lunch PC Dinner  Glucose range:     Mean/median:  138  134  168   Previous data:  PRE-MEAL Fasting Lunch Dinner Bedtime Overall  Glucose range:       Mean/median:  121  132  96   132   POST-MEAL PC Breakfast PC Lunch PC Dinner  Glucose range:     Mean/median:  141  99  163      Meals: 3 meals per day, vegetarian, tries to get protein like nuts, [name] yogurt, soy, fish  Dinner usually at 7-8 pm.Cheese sandwiich, or vegetable soup with cheese, may have 1-2 starches per meal Sometimes will have  peanut butter sandwich for lunch  Physical activity:mostly walking stairs, upto 6,000 steps per day  Dietician visit: Most recent: 03/2013  CDE visit: 10/15    Wt Readings from Last 3 Encounters:  10/23/20 154 lb 6.4 oz (70 kg)  09/25/20 147 lb (66.7 kg)  07/22/20 148 lb 3.2 oz (67.2 kg)    LABS:  Lab Results  Component Value Date   HGBA1C 6.8 (H) 10/21/2020  HGBA1C 6.9 (H) 07/12/2020   HGBA1C 6.2 04/12/2020   Lab Results  Component Value Date   MICROALBUR 1.0 04/12/2020   LDLCALC 76 10/21/2020   CREATININE 0.68 10/21/2020      Lab on 10/21/2020  Component Date Value Ref Range Status  . Cholesterol 10/21/2020 164  0 - 200 mg/dL Final   ATP III Classification       Desirable:  < 200 mg/dL               Borderline High:  200 - 239 mg/dL          High:  > = 240 mg/dL  . Triglycerides 10/21/2020 54.0  0 - 149 mg/dL Final   Normal:  <150 mg/dLBorderline High:  150 - 199 mg/dL  . HDL 10/21/2020 77.70  >39.00 mg/dL Final  . VLDL 10/21/2020 10.8  0.0 - 40.0 mg/dL Final  . LDL Cholesterol 10/21/2020 76  0 - 99 mg/dL Final  . Total CHOL/HDL Ratio 10/21/2020 2   Final                  Men          Women1/2 Average Risk     3.4           3.3Average Risk          5.0          4.42X Average Risk          9.6          7.13X Average Risk          15.0          11.0                      . NonHDL 10/21/2020 86.74   Final   NOTE:  Non-HDL goal should be 30 mg/dL higher than patient's LDL goal (i.e. LDL goal of < 70 mg/dL, would have non-HDL goal of < 100 mg/dL)  . Sodium 10/21/2020 138  135 - 145 mEq/L Final  . Potassium 10/21/2020 3.8  3.5 - 5.1 mEq/L Final  . Chloride 10/21/2020 104  96 - 112 mEq/L Final  . CO2 10/21/2020 29  19 - 32 mEq/L Final  . Glucose, Bld 10/21/2020 143* 70 - 99 mg/dL Final  . BUN 10/21/2020 13  6 - 23 mg/dL Final  . Creatinine, Ser 10/21/2020 0.68  0.40 - 1.20 mg/dL Final  . Total Bilirubin 10/21/2020 0.6  0.2 - 1.2 mg/dL Final  . Alkaline Phosphatase 10/21/2020 81  39 - 117 U/L Final  . AST 10/21/2020 26  0 - 37 U/L Final  . ALT 10/21/2020 20  0 - 35 U/L Final  . Total Protein 10/21/2020 6.6  6.0 - 8.3 g/dL Final  . Albumin 10/21/2020 3.8  3.5 - 5.2 g/dL Final  . GFR 10/21/2020 104.50  >60.00 mL/min Final   Calculated using the CKD-EPI Creatinine Equation (2021)  . Calcium 10/21/2020 8.8  8.4 - 10.5 mg/dL Final  . Hgb A1c MFr Bld 10/21/2020 6.8* 4.6 - 6.5 % Final   Glycemic Control Guidelines for People with Diabetes:Non Diabetic:  <6%Goal of Therapy: <7%Additional Action Suggested:  >8%     Allergies as of 10/23/2020      Reactions   Other Anaphylaxis   Peaches , tomatoes, strawberry   Strawberry Extract Hives   Facial hives and mouth hives   Tomato Hives   Facial hives and mouth hives  Medication List       Accurate as of October 23, 2020  9:15 AM. If you have any questions, ask your nurse or doctor.        STOP taking these medications   atomoxetine 60 MG capsule Commonly known as: STRATTERA Stopped by: Reather Littler, MD   ondansetron 4 MG disintegrating tablet Commonly known as: Zofran ODT Stopped by: Reather Littler, MD     TAKE these medications   atorvastatin 10 MG  tablet Commonly known as: LIPITOR Take 10 mg by mouth daily.   DULoxetine 60 MG capsule Commonly known as: CYMBALTA Take 60 mg by mouth daily.   Farxiga 5 MG Tabs tablet Generic drug: dapagliflozin propanediol TAKE 5 MG BY MOUTH DAILY.   Fiasp 100 UNIT/ML Soln Generic drug: Insulin Aspart (w/Niacinamide) INJECT 30 UNITS INTO THE SKIN DAILY. USE MAX OF 30 UNITS DAILY VIA INSULIN PUMP   FreeStyle Libre 2 Sensor Misc USE AS DIRECTED   multivitamin with minerals Tabs tablet Take 1 tablet by mouth daily.   OmniPod Dash 5 Pack Pods Misc CHANGE 1 POD EVERY 72 HOURS AS DIRECTED   OneTouch Delica Plus Lancet30G Misc 1 each by Does not apply route See admin instructions. Use Onetouch Delica Plus lancets to check blood sugar before and after each meal.   OneTouch Verio Flex System w/Device Kit Use Onetouch verio flex meter to check blood sugar before and after each meal.   OneTouch Verio test strip Generic drug: glucose blood USE ONETOUCH VERIO FLEX AS INSTRUCTED TO CHECK BLOOD SUGAR BEFORE AND AFTER EACH MEAL.   vitamin B-12 1000 MCG tablet Commonly known as: CYANOCOBALAMIN Take 1,000 mcg by mouth daily.       Allergies:  Allergies  Allergen Reactions  . Other Anaphylaxis    Peaches , tomatoes, strawberry  . Strawberry Extract Hives    Facial hives and mouth hives  . Tomato Hives    Facial hives and mouth hives    Past Medical History:  Diagnosis Date  . Back pain   . Diabetes mellitus   . Fibroids   . PCOS (polycystic ovarian syndrome)     Past Surgical History:  Procedure Laterality Date  . BREAST EXCISIONAL BIOPSY Right   . BREAST SURGERY     benign  . IR ANGIOGRAM PELVIS SELECTIVE OR SUPRASELECTIVE  11/17/2018  . IR ANGIOGRAM PELVIS SELECTIVE OR SUPRASELECTIVE  11/17/2018  . IR ANGIOGRAM SELECTIVE EACH ADDITIONAL VESSEL  11/17/2018  . IR ANGIOGRAM SELECTIVE EACH ADDITIONAL VESSEL  11/17/2018  . IR EMBO TUMOR ORGAN ISCHEMIA INFARCT INC GUIDE ROADMAPPING   11/17/2018  . IR RADIOLOGIST EVAL & MGMT  09/14/2018  . IR RADIOLOGIST EVAL & MGMT  12/13/2018  . IR RADIOLOGIST EVAL & MGMT  06/28/2019  . IR US GUIDE VASC ACCESS RIGHT  11/17/2018  . UTERINE FIBROID SURGERY      Family History  Problem Relation Age of Onset  . Hyperlipidemia Other   . Hypertension Other   . Stroke Other   . Cancer Other   . Diabetes Maternal Uncle   . Heart disease Paternal Uncle   . Diabetes Maternal Grandmother   . Heart disease Maternal Grandmother   . Breast cancer Maternal Grandmother        diagnosed in her 60's  . Diabetes Paternal Grandmother     Social History:  reports that she has never smoked. She has never used smokeless tobacco. She reports that she does not drink alcohol and does not use  drugs.  Review of Systems -  HYPERCHOLESTEROLEMIA: This has been variable prior to treatment  Baseline LDL was only 100 and PCP has prescribed her on Lipitor 10 mg Last labs show excellent results  Lab Results  Component Value Date   CHOL 164 10/21/2020   HDL 77.70 10/21/2020   LDLCALC 76 10/21/2020   TRIG 54.0 10/21/2020   CHOLHDL 2 10/21/2020   No history of hypertension  She has had both Covid vaccine injections, last in 5/21     Examination:   BP 116/78   Pulse 70   Ht $R'5\' 5"'xu$  (1.651 m)   Wt 154 lb 6.4 oz (70 kg)   SpO2 98%   BMI 25.69 kg/m   Body mass index is 25.69 kg/m.   Diabetic Foot Exam - Simple   Simple Foot Form Diabetic Foot exam was performed with the following findings: Yes   Visual Inspection No deformities, no ulcerations, no other skin breakdown bilaterally: Yes Sensation Testing Intact to touch and monofilament testing bilaterally: Yes Pulse Check Posterior Tibialis and Dorsalis pulse intact bilaterally: Yes Comments      Assesment:   DIABETES type 2:  See history of present illness for detailed discussion of  current management, blood sugar patterns and problems identified Her CGM data was analyzed on  download  Her A1c is stable at 6.8  She is on the Omni pod insulin pump using Fiasp and Farxiga  Blood sugar data was reviewed from her freestyle [employer] and interpretation as discussed above However unable to download her insulin pump and get her day-to-day management evaluated She says that she is trying to bolus at least 5 minutes at a time but this may not be adequate even with taking Fiasp insulin Certain foods make her blood sugar go up relatively more possibly because of her being on a vegan diet Otherwise higher fat intake will plan to call prolonged hyperglycemia later after eating Some of her hyperglycemia is related to correcting high readings but occasionally not explainable Basal rate appears to be adequate with no consistent high or low readings in between meals  Recommendations today:  Try to bolus at least 10 minutes before meals  Have some protein with every meal consistently  She will not try to make corrections for postprandial hyperglycemia unless having higher fat meals are consistent hyperglycemia present.  Avoid overtreating low sugars  Start regular exercise  She will try to call the company again to get a new PDM to enable pump upload and also try to upload at home prior to visit  HYPERCHOLESTEROLEMIA: Well-controlled  No change in Lipitor dose  Follow-up in 3 months   Patient Instructions  Bolus 10 min before meals       Elayne Snare 10/23/2020, 9:15 AM   Note: This office note was prepared with Dragon voice recognition system technology. Any transcriptional errors that result from this process are unintentional.

## 2020-10-23 ENCOUNTER — Other Ambulatory Visit: Payer: Self-pay

## 2020-10-23 ENCOUNTER — Encounter: Payer: Self-pay | Admitting: Endocrinology

## 2020-10-23 ENCOUNTER — Ambulatory Visit: Payer: BC Managed Care – PPO | Admitting: Endocrinology

## 2020-10-23 VITALS — BP 116/78 | HR 70 | Ht 65.0 in | Wt 154.4 lb

## 2020-10-23 DIAGNOSIS — E1165 Type 2 diabetes mellitus with hyperglycemia: Secondary | ICD-10-CM | POA: Diagnosis not present

## 2020-10-23 DIAGNOSIS — E7800 Pure hypercholesterolemia, unspecified: Secondary | ICD-10-CM | POA: Diagnosis not present

## 2020-10-23 DIAGNOSIS — Z794 Long term (current) use of insulin: Secondary | ICD-10-CM

## 2020-10-23 NOTE — Patient Instructions (Signed)
Bolus 10 min before meals

## 2020-10-24 ENCOUNTER — Encounter: Payer: Self-pay | Admitting: *Deleted

## 2020-11-06 ENCOUNTER — Ambulatory Visit
Admission: RE | Admit: 2020-11-06 | Discharge: 2020-11-06 | Disposition: A | Discharge status: Home / Self Care | Payer: BC Managed Care – PPO | Source: Ambulatory Visit | Attending: Physician Assistant | Admitting: Physician Assistant

## 2020-11-06 ENCOUNTER — Other Ambulatory Visit: Payer: Self-pay

## 2020-11-06 DIAGNOSIS — Z1231 Encounter for screening mammogram for malignant neoplasm of breast: Secondary | ICD-10-CM

## 2020-11-18 ENCOUNTER — Encounter: Payer: Self-pay | Admitting: *Deleted

## 2020-11-19 ENCOUNTER — Other Ambulatory Visit: Payer: Self-pay | Admitting: Endocrinology

## 2020-11-20 ENCOUNTER — Other Ambulatory Visit: Payer: Self-pay | Admitting: Endocrinology

## 2020-12-02 ENCOUNTER — Other Ambulatory Visit: Payer: Self-pay | Admitting: Endocrinology

## 2020-12-31 ENCOUNTER — Other Ambulatory Visit: Payer: Self-pay | Admitting: Endocrinology

## 2021-01-20 ENCOUNTER — Other Ambulatory Visit (INDEPENDENT_AMBULATORY_CARE_PROVIDER_SITE_OTHER): Payer: BC Managed Care – PPO

## 2021-01-20 ENCOUNTER — Other Ambulatory Visit: Payer: Self-pay

## 2021-01-20 DIAGNOSIS — E1165 Type 2 diabetes mellitus with hyperglycemia: Secondary | ICD-10-CM | POA: Diagnosis not present

## 2021-01-20 DIAGNOSIS — Z794 Long term (current) use of insulin: Secondary | ICD-10-CM | POA: Diagnosis not present

## 2021-01-20 LAB — BASIC METABOLIC PANEL
BUN: 9 mg/dL (ref 6–23)
CO2: 29 mEq/L (ref 19–32)
Calcium: 9.6 mg/dL (ref 8.4–10.5)
Chloride: 101 mEq/L (ref 96–112)
Creatinine, Ser: 0.8 mg/dL (ref 0.40–1.20)
GFR: 88.25 mL/min (ref >60.00)
Glucose, Bld: 88 mg/dL (ref 70–99)
Potassium: 3.9 mEq/L (ref 3.5–5.1)
Sodium: 137 mEq/L (ref 135–145)

## 2021-01-20 LAB — HEMOGLOBIN A1C: Hgb A1c MFr Bld: 6.9 % — ABNORMAL HIGH (ref 4.6–6.5)

## 2021-01-23 ENCOUNTER — Other Ambulatory Visit: Payer: Self-pay | Admitting: *Deleted

## 2021-01-23 ENCOUNTER — Encounter: Payer: Self-pay | Admitting: Endocrinology

## 2021-01-23 ENCOUNTER — Other Ambulatory Visit: Payer: Self-pay

## 2021-01-23 ENCOUNTER — Ambulatory Visit: Payer: BC Managed Care – PPO | Admitting: Endocrinology

## 2021-01-23 VITALS — BP 118/72 | HR 70 | Ht 65.0 in | Wt 155.0 lb

## 2021-01-23 DIAGNOSIS — E1165 Type 2 diabetes mellitus with hyperglycemia: Secondary | ICD-10-CM

## 2021-01-23 DIAGNOSIS — Z794 Long term (current) use of insulin: Secondary | ICD-10-CM | POA: Diagnosis not present

## 2021-01-23 DIAGNOSIS — E7800 Pure hypercholesterolemia, unspecified: Secondary | ICD-10-CM | POA: Diagnosis not present

## 2021-01-23 MED ORDER — ONETOUCH VERIO FLEX SYSTEM W/DEVICE KIT: PACK | 0 refills | Status: DC

## 2021-01-23 MED ORDER — ONETOUCH DELICA PLUS LANCET30G MISC: 1.0000 | 3 refills | Status: DC

## 2021-01-23 MED ORDER — ONETOUCH VERIO VI STRP: ORAL_STRIP | 3 refills | Status: DC

## 2021-01-23 NOTE — Progress Notes (Signed)
[name], female   DOB: [birth year], 45 y.o.   MRN: [id number]    Reason for Appointment: Diabetes follow-up  History of Present Illness   Diagnosis: date of diagnosis: 2009, initially asymptomatic, has type 2 diabetes.   RECENT history:   Current treatment regimen:  OMNIPOD insulin pump  BASAL RATES: Midnight = 0.3, 5 AM = 0.45, 9 AM = 0.3 and 7 PM = 0. 45   Bolus settings: I/C ratio 12 at breakfast and dinner and 1: 14 at lunch, ISF: 60, Target: 110, with correction over 110.   Non-insulin hypoglycemic drugs: Farxiga 5 mg daily  A1c is about the same at 6.9  Blood sugar patterns, management:  Her insulin pump PDM again could not be downloaded and data could not be evaluated   Interpretation from the CGM interpretation is as below   Blood sugars are within the target range on an average throughout the day and night with the HIGHEST average blood sugar at midnight and lowest from 6-8 AM  She has higher blood sugar variability between 12 PM and 6 PM and less overnight  OVERNIGHT blood sugars are starting of high around 180 on an average at midnight and leveling off at about 2-3 AM until 7 AM without hypoglycemia  HYPERGLYCEMIC episodes are occurring inconsistently in the early afternoons  Occasionally at different times after evening meal  POSTPRANDIAL readings in an average are not rising excessively with a change of about 40 mg after dinner at the highest  HYPOGLYCEMIA has been usually transient and occurring at variable times including late afternoon, around dinnertime and last night around 5 AM; this is frequently preceded by hyperglycemia  Currently she has her low blood sugar target set at 65  Has not had a meter to compare with fingerstick but her lab glucose appeared to be at least 20 to 30 mg lower than the actual blood sugar at the same time on [name]   She says that her blood sugars are difficult to control because of her  not consistently having protein with her vegetarian meals  Also at times will have frequent snacks and may not be covering her intake at appropriate times  Last night she bolus for 100 g of carbs during the night but likely did not eat as much and blood sugar was low subsequently  Is not doing any formal exercise and may be only going for walks about once a week  She feels that her blood sugar may go up 2 hours after a meal if it is relatively high fat such as with avocado; usually tries to avoid pizza which causes significant high sugars late  Her weight is about the same  Side effects from medications: Diarrhea from metformin and abdominal discomfort  Data from freestyle libre version 2 download for the last 2 weeks as follows:  CGM use % of time  92  2-week average/GV  137/32  Time in range      83%  % Time Above 180  14+2  % Time above 250   % Time Below 70  1     PRE-MEAL Fasting Lunch Dinner Bedtime Overall  Glucose range:       Averages:  120  135  129  167    POST-MEAL PC Breakfast PC Lunch PC Dinner  Glucose range:     Averages:  132  148  137      Previous data:   CGM use % of  time  94  Average and SD  141, GV 27  Time in range      76, was 82%  % Time Above 180  18  % Time above 250  3  % Time Below target  3     PRE-MEAL Fasting Lunch Dinner Bedtime Overall  Glucose range:       Mean/median:  121  141  130   141   POST-MEAL PC Breakfast PC Lunch PC Dinner  Glucose range:     Mean/median:  138  134  168     Meals: 3 meals per day, vegetarian, tries to get protein like nuts, [place] yogurt, soy, fish  Dinner usually at 7-8 pm.Cheese sandwiich, or vegetable soup with cheese, may have 1-2 starches per meal Sometimes will have  peanut butter sandwich for lunch  Dietician visit: Most recent: 02/2020  CDE visit: 10/15    Wt Readings from Last 3 Encounters:  01/23/21 155 lb (70.3 kg)  10/23/20 154 lb 6.4 oz (70 kg)  09/25/20 147 lb (66.7 kg)     LABS:  Lab Results  Component Value Date   HGBA1C 6.9 (H) 01/20/2021   HGBA1C 6.8 (H) 10/21/2020   HGBA1C 6.9 (H) 07/12/2020   Lab Results  Component Value Date   MICROALBUR 1.0 04/12/2020   LDLCALC 76 10/21/2020   CREATININE 0.80 01/20/2021       Lab on 01/20/2021  Component Date Value Ref Range Status   Sodium 01/20/2021 137  135 - 145 mEq/L Final   Potassium 01/20/2021 3.9  3.5 - 5.1 mEq/L Final   Chloride 01/20/2021 101  96 - 112 mEq/L Final   CO2 01/20/2021 29  19 - 32 mEq/L Final   Glucose, Bld 01/20/2021 88  70 - 99 mg/dL Final   BUN 16/74/9328 9  6 - 23 mg/dL Final   Creatinine, Ser 01/20/2021 0.80  0.40 - 1.20 mg/dL Final   GFR 12/72/7549 88.25  >60.00 mL/min Final   Calculated using the CKD-EPI Creatinine Equation (2021)   Calcium 01/20/2021 9.6  8.4 - 10.5 mg/dL Final   Hgb I3A MFr Bld 01/20/2021 6.9* 4.6 - 6.5 % Final   Glycemic Control Guidelines for People with Diabetes:Non Diabetic:  <6%Goal of Therapy: <7%Additional Action Suggested:  >8%     Allergies as of 01/23/2021      Reactions   Other Anaphylaxis   Peaches , tomatoes, strawberry   Strawberry Extract Hives   Facial hives and mouth hives   Tomato Hives   Facial hives and mouth hives      Medication List       Accurate as of January 23, 2021  9:25 AM. If you have any questions, ask your nurse or doctor.        atorvastatin 10 MG tablet Commonly known as: LIPITOR Take 10 mg by mouth daily.   DULoxetine 60 MG capsule Commonly known as: CYMBALTA Take 60 mg by mouth daily.   Farxiga 5 MG Tabs tablet Generic drug: dapagliflozin propanediol TAKE 5 MG BY MOUTH DAILY.   Fiasp 100 UNIT/ML Soln Generic drug: Insulin Aspart (w/Niacinamide) INJECT 30 UNITS INTO THE SKIN DAILY. USE MAX OF 30 UNITS DAILY VIA INSULIN PUMP   FreeStyle Libre 2 Sensor Misc USE AS DIRECTED   multivitamin with minerals Tabs tablet Take 1 tablet by mouth daily.   OmniPod Dash 5 Pack Pods Misc CHANGE 1 POD  EVERY 72 HOURS AS DIRECTED   OneTouch Delica Plus Lancet30G Misc 1 each  by Does not apply route See admin instructions. Use Onetouch Delica Plus lancets to check blood sugar before and after each meal.   OneTouch Verio Flex System w/Device Kit USE ONETOUCH VERIO FLEX METER TO CHECK BLOOD SUGAR BEFORE AND AFTER EACH MEAL.   OneTouch Verio test strip Generic drug: glucose blood USE ONETOUCH VERIO FLEX AS INSTRUCTED TO CHECK BLOOD SUGAR BEFORE AND AFTER EACH MEAL.   vitamin B-12 1000 MCG tablet Commonly known as: CYANOCOBALAMIN Take 1,000 mcg by mouth daily.       Allergies:  Allergies  Allergen Reactions   Other Anaphylaxis    Peaches , tomatoes, strawberry   Strawberry Extract Hives    Facial hives and mouth hives   Tomato Hives    Facial hives and mouth hives    Past Medical History:  Diagnosis Date   Back pain    Diabetes mellitus    Fibroids    PCOS (polycystic ovarian syndrome)     Past Surgical History:  Procedure Laterality Date   BREAST EXCISIONAL BIOPSY Right    BREAST SURGERY     benign   IR ANGIOGRAM PELVIS SELECTIVE OR SUPRASELECTIVE  11/17/2018   IR ANGIOGRAM PELVIS SELECTIVE OR SUPRASELECTIVE  11/17/2018   IR ANGIOGRAM SELECTIVE EACH ADDITIONAL VESSEL  11/17/2018   IR ANGIOGRAM SELECTIVE EACH ADDITIONAL VESSEL  11/17/2018   IR EMBO TUMOR ORGAN ISCHEMIA INFARCT INC GUIDE ROADMAPPING  11/17/2018   IR RADIOLOGIST EVAL & MGMT  09/14/2018   IR RADIOLOGIST EVAL & MGMT  12/13/2018   IR RADIOLOGIST EVAL & MGMT  06/28/2019   IR US GUIDE VASC ACCESS RIGHT  11/17/2018   UTERINE FIBROID SURGERY      Family History  Problem Relation Age of Onset   Hyperlipidemia Other    Hypertension Other    Stroke Other    Cancer Other    Diabetes Maternal Uncle    Heart disease Paternal Uncle    Diabetes Maternal Grandmother    Heart disease Maternal Grandmother    Breast cancer Maternal Grandmother        diagnosed in her 45's   Diabetes  Paternal Grandmother     Social History:  reports that she has never smoked. She has never used smokeless tobacco. She reports that she does not drink alcohol and does not use drugs.  Review of Systems -  HYPERCHOLESTEROLEMIA: This has been variable prior to treatment  Baseline LDL was only 100 and PCP has prescribed her on Lipitor 10 mg Last labs show excellent results  Lab Results  Component Value Date   CHOL 164 10/21/2020   HDL 77.70 10/21/2020   LDLCALC 76 10/21/2020   TRIG 54.0 10/21/2020   CHOLHDL 2 10/21/2020   No history of hypertension  She is asking about taking the fourth Covid vaccine, currently not a candidate since she is not highly immunocompromised     Examination:   BP 118/72    Pulse 70    Ht $R'5\' 5"'dZ$  (1.651 m)    Wt 155 lb (70.3 kg)    SpO2 97%    BMI 25.79 kg/m   Body mass index is 25.79 kg/m.    Assesment:   DIABETES type 2:  See history of present illness for detailed discussion of  current management, blood sugar patterns and problems identified Her CGM data was analyzed on download  Her A1c is stable at 6.9  She is on the Omni pod insulin pump using [place]   Her blood sugars  are generally controlled and 83% within target range However she has significant variability in her blood sugar patterns She is trying to deal with her meal planning and snacking which has been suboptimal partly because of anxiety and stress eating Also difficult to cover her meals because of vegan status and not always getting enough protein High-fat meals tend to raise her blood sugars 2 hours later At times will get low sugars from stacking boluses for snacks or overestimating how much she will eat  Recommendations today:  Try to bolus based on actual carbohydrates she is having but if she is not sure she can bolus only part of it before eating  No change in carbohydrate coverage or correction factor as yet  Have adequate amount of protein with every  meal  Recommended that she review the information the dietitian is given to her last year for better meal planning  Extended boluses for higher fat meals with additional 1 to 2 units  She will set the low sugar alert on her freestyle libre to 70 instead of 65 and try to take action when blood sugars are heading lower  Avoid overtreating low sugars  Start regular walking for exercise  She will try to call the OmniPod to see if she will be eligible for the new pump  HYPERCHOLESTEROLEMIA: We will recheck labs on the next visit   Follow-up in 3 months   There are no Patient Instructions on file for this visit.      Elayne Snare 01/23/2021, 9:25 AM   Note: This office note was prepared with Dragon voice recognition system technology. Any transcriptional errors that result from this process are unintentional.

## 2021-04-10 ENCOUNTER — Other Ambulatory Visit: Payer: Self-pay | Admitting: Endocrinology

## 2021-04-28 ENCOUNTER — Other Ambulatory Visit: Payer: BC Managed Care – PPO

## 2021-05-01 ENCOUNTER — Ambulatory Visit: Payer: BC Managed Care – PPO | Admitting: Endocrinology

## 2021-07-03 ENCOUNTER — Other Ambulatory Visit: Payer: BC Managed Care – PPO

## 2021-07-04 ENCOUNTER — Other Ambulatory Visit (INDEPENDENT_AMBULATORY_CARE_PROVIDER_SITE_OTHER): Payer: BC Managed Care – PPO

## 2021-07-04 DIAGNOSIS — E7800 Pure hypercholesterolemia, unspecified: Secondary | ICD-10-CM | POA: Diagnosis not present

## 2021-07-04 DIAGNOSIS — E1165 Type 2 diabetes mellitus with hyperglycemia: Secondary | ICD-10-CM

## 2021-07-04 DIAGNOSIS — Z794 Long term (current) use of insulin: Secondary | ICD-10-CM | POA: Diagnosis not present

## 2021-07-04 LAB — MICROALBUMIN / CREATININE URINE RATIO
Creatinine,U: 52.4 mg/dL
Microalb Creat Ratio: 1.3 mg/g (ref 0.0–30.0)
Microalb, Ur: <0.7 mg/dL (ref 0.0–1.9)

## 2021-07-04 LAB — COMPREHENSIVE METABOLIC PANEL
ALT: 22 U/L (ref 0–35)
AST: 25 U/L (ref 0–37)
Albumin: 4.3 g/dL (ref 3.5–5.2)
Alkaline Phosphatase: 103 U/L (ref 39–117)
BUN: 13 mg/dL (ref 6–23)
CO2: 26 mEq/L (ref 19–32)
Calcium: 9.6 mg/dL (ref 8.4–10.5)
Chloride: 102 mEq/L (ref 96–112)
Creatinine, Ser: 0.81 mg/dL (ref 0.40–1.20)
GFR: 86.67 mL/min (ref >60.00)
Glucose, Bld: 195 mg/dL — ABNORMAL HIGH (ref 70–99)
Potassium: 3.9 mEq/L (ref 3.5–5.1)
Sodium: 138 mEq/L (ref 135–145)
Total Bilirubin: 1.3 mg/dL — ABNORMAL HIGH (ref 0.2–1.2)
Total Protein: 7.3 g/dL (ref 6.0–8.3)

## 2021-07-04 LAB — LIPID PANEL
Cholesterol: 148 mg/dL (ref 0–200)
HDL: 85 mg/dL (ref >39.00)
LDL Cholesterol: 55 mg/dL (ref 0–99)
NonHDL: 62.51
Total CHOL/HDL Ratio: 2
Triglycerides: 37 mg/dL (ref 0.0–149.0)
VLDL: 7.4 mg/dL (ref 0.0–40.0)

## 2021-07-04 LAB — HEMOGLOBIN A1C: Hgb A1c MFr Bld: 7.5 % — ABNORMAL HIGH (ref 4.6–6.5)

## 2021-07-07 NOTE — Progress Notes (Signed)
[name], female   DOB: [birth year], 45 y.o.   MRN: [id number]    Reason for Appointment: follow-up  History of Present Illness   Diagnosis: date of diagnosis: 2009, initially asymptomatic, has type 2 diabetes.   RECENT history:   Current treatment regimen:  OMNIPOD insulin pump  BASAL RATES: Midnight = 0.3, 5 AM = 0.45, 9 AM = 0.3 and 7 PM = 0. 45   Bolus settings: I/C ratio 12 at breakfast and dinner and 1: 14 at lunch, ISF: 60, Target: 110, with correction over 110.   Non-insulin hypoglycemic drugs: Farxiga 5 mg daily  A1c is higher at 7.5 compared to 6.9  Blood sugar patterns, management: Her insulin pump PDM again could not be downloaded and data could not be evaluated even though she has a new PDM  She is also not using the freestyle libre because it was too overwhelming for her especially when she was having depression  She has variable blood sugar patterns and no consistent high or low blood sugars  Although she is recently starting to do some walking for exercise she has gained 2 pounds this year since her last visit  She thinks that some of her high blood sugars may be from eating more than anticipated especially in the evenings  However she recently appears to be getting low blood sugars preceded by high readings  This may be related to excessive bolusing or also adding some walking after the meal causing the sugar to come down  Also occasionally may rebound from excessive treatment of blood sugars  Not doing many readings on waking up recently and may be generally having high readings fasting now  She has taken her Wilder Glade consistently  Side effects from medications: Diarrhea from metformin and abdominal discomfort  Blood sugar patterns from meter download: Mildly increased fasting and noontime sugars overall with more variable readings in the afternoons and evenings Average blood sugar appears to be the highest midday and late  evening Hypoglycemia tends to be occurring between about 4 PM-midnight sporadically with 5 episodes  PRE-MEAL Morning Midday Early evening Night Overall  Glucose range:     43-480  Mean/median: 161 170 141 171 156   Previous data:  CGM use % of time  92  2-week average/GV  137/32  Time in range      83%  % Time Above 180  14+2  % Time above 250   % Time Below 70  1     PRE-MEAL Fasting Lunch Dinner Bedtime Overall  Glucose range:       Averages:  120  135  129  167    POST-MEAL PC Breakfast PC Lunch PC Dinner  Glucose range:     Averages:  132  148  137      Meals: 3 meals per day, vegetarian, tries to get protein like nuts, [name] yogurt, soy, fish  Dinner usually at 7-8 pm.Cheese sandwiich, or vegetable soup with cheese, may have 1-2 starches per meal Sometimes will have  peanut butter sandwich for lunch  Dietician visit: Most recent: 02/2020  CDE visit: 10/15    Wt Readings from Last 3 Encounters:  07/08/21 157 lb 9.6 oz (71.5 kg)  01/23/21 155 lb (70.3 kg)  10/23/20 154 lb 6.4 oz (70 kg)    LABS:  Lab Results  Component Value Date   HGBA1C 7.5 (H) 07/04/2021   HGBA1C 6.9 (H) 01/20/2021   HGBA1C 6.8 (H) 10/21/2020  Lab Results  Component Value Date   MICROALBUR <0.7 07/04/2021   LDLCALC 55 07/04/2021   CREATININE 0.81 07/04/2021       Appointment on 07/04/2021  Component Date Value Ref Range Status   Cholesterol 07/04/2021 148  0 - 200 mg/dL Final   ATP III Classification       Desirable:  < 200 mg/dL               Borderline High:  200 - 239 mg/dL          High:  > = 240 mg/dL   Triglycerides 07/04/2021 37.0  0.0 - 149.0 mg/dL Final   Normal:  <150 mg/dLBorderline High:  150 - 199 mg/dL   HDL 07/04/2021 85.00  >39.00 mg/dL Final   VLDL 07/04/2021 7.4  0.0 - 40.0 mg/dL Final   LDL Cholesterol 07/04/2021 55  0 - 99 mg/dL Final   Total CHOL/HDL Ratio 07/04/2021 2   Final                  Men          Women1/2 Average Risk     3.4          3.3Average  Risk          5.0          4.42X Average Risk          9.6          7.13X Average Risk          15.0          11.0                       NonHDL 07/04/2021 62.51   Final   NOTE:  Non-HDL goal should be 30 mg/dL higher than patient's LDL goal (i.e. LDL goal of < 70 mg/dL, would have non-HDL goal of < 100 mg/dL)   Microalb, Ur 07/04/2021 <0.7  0.0 - 1.9 mg/dL Final   Creatinine,U 07/04/2021 52.4  mg/dL Final   Microalb Creat Ratio 07/04/2021 1.3  0.0 - 30.0 mg/g Final   Sodium 07/04/2021 138  135 - 145 mEq/L Final   Potassium 07/04/2021 3.9  3.5 - 5.1 mEq/L Final   Chloride 07/04/2021 102  96 - 112 mEq/L Final   CO2 07/04/2021 26  19 - 32 mEq/L Final   Glucose, Bld 07/04/2021 195 (A) 70 - 99 mg/dL Final   BUN 07/04/2021 13  6 - 23 mg/dL Final   Creatinine, Ser 07/04/2021 0.81  0.40 - 1.20 mg/dL Final   Total Bilirubin 07/04/2021 1.3 (A) 0.2 - 1.2 mg/dL Final   Alkaline Phosphatase 07/04/2021 103  39 - 117 U/L Final   AST 07/04/2021 25  0 - 37 U/L Final   ALT 07/04/2021 22  0 - 35 U/L Final   Total Protein 07/04/2021 7.3  6.0 - 8.3 g/dL Final   Albumin 07/04/2021 4.3  3.5 - 5.2 g/dL Final   GFR 07/04/2021 86.67  >60.00 mL/min Final   Calculated using the CKD-EPI Creatinine Equation (2021)   Calcium 07/04/2021 9.6  8.4 - 10.5 mg/dL Final   Hgb A1c MFr Bld 07/04/2021 7.5 (A) 4.6 - 6.5 % Final   Glycemic Control Guidelines for People with Diabetes:Non Diabetic:  <6%Goal of Therapy: <7%Additional Action Suggested:  >8%     Allergies as of 07/08/2021       Reactions   Other Anaphylaxis   Peaches ,  tomatoes, strawberry   Strawberry Extract Hives   Facial hives and mouth hives   Tomato Hives   Facial hives and mouth hives        Medication List        Accurate as of July 08, 2021  9:25 AM. If you have any questions, ask your nurse or doctor.          atorvastatin 10 MG tablet Commonly known as: LIPITOR Take 10 mg by mouth daily.   buPROPion 150 MG 24 hr tablet Commonly  known as: WELLBUTRIN XL Take 150 mg by mouth every morning.   DULoxetine 60 MG capsule Commonly known as: CYMBALTA Take 60 mg by mouth daily.   Farxiga 5 MG Tabs tablet Generic drug: dapagliflozin propanediol TAKE 5 MG BY MOUTH DAILY.   Fiasp 100 UNIT/ML Soln Generic drug: Insulin Aspart (w/Niacinamide) INJECT 30 UNITS INTO THE SKIN DAILY. USE MAX OF 30 UNITS DAILY VIA INSULIN PUMP   FreeStyle Libre 2 Sensor Misc USE AS DIRECTED   multivitamin with minerals Tabs tablet Take 1 tablet by mouth daily.   Omnipod DASH Pods (Gen 4) Misc CHANGE 1 POD EVERY 72 HOURS AS DIRECTED   OneTouch Delica Plus [id number] Misc 1 each by Does not apply route See admin instructions. Use Onetouch Delica Plus lancets to check blood sugar before and after each meal.   OneTouch Verio Flex System w/Device Kit CHECK BLOOD SUGAR BEFORE AND AFTER EACH MEAL   OneTouch Verio test strip Generic drug: glucose blood USE ONETOUCH VERIO FLEX AS INSTRUCTED TO CHECK BLOOD SUGAR BEFORE AND AFTER EACH MEAL.   vitamin B-12 1000 MCG tablet Commonly known as: CYANOCOBALAMIN Take 1,000 mcg by mouth daily.        Allergies:  Allergies  Allergen Reactions   Other Anaphylaxis    Peaches , tomatoes, strawberry   Strawberry Extract Hives    Facial hives and mouth hives   Tomato Hives    Facial hives and mouth hives    Past Medical History:  Diagnosis Date   Back pain    Diabetes mellitus    Fibroids    PCOS (polycystic ovarian syndrome)     Past Surgical History:  Procedure Laterality Date   BREAST EXCISIONAL BIOPSY Right    BREAST SURGERY     benign   IR ANGIOGRAM PELVIS SELECTIVE OR SUPRASELECTIVE  11/17/2018   IR ANGIOGRAM PELVIS SELECTIVE OR SUPRASELECTIVE  11/17/2018   IR ANGIOGRAM SELECTIVE EACH ADDITIONAL VESSEL  11/17/2018   IR ANGIOGRAM SELECTIVE EACH ADDITIONAL VESSEL  11/17/2018   IR EMBO TUMOR ORGAN ISCHEMIA INFARCT INC GUIDE ROADMAPPING  11/17/2018   IR RADIOLOGIST EVAL & MGMT   09/14/2018   IR RADIOLOGIST EVAL & MGMT  12/13/2018   IR RADIOLOGIST EVAL & MGMT  06/28/2019   IR US GUIDE VASC ACCESS RIGHT  11/17/2018   UTERINE FIBROID SURGERY      Family History  Problem Relation Age of Onset   Hyperlipidemia Other    Hypertension Other    Stroke Other    Cancer Other    Diabetes Maternal Uncle    Heart disease Paternal Uncle    Diabetes Maternal Grandmother    Heart disease Maternal Grandmother    Breast cancer Maternal Grandmother        diagnosed in her 45's   Diabetes Paternal Grandmother     Social History:  reports that she has never smoked. She has never used smokeless tobacco. She reports that she does not drink  alcohol and does not use drugs.  Review of Systems -  HYPERCHOLESTEROLEMIA: This has been variable prior to treatment  Baseline LDL was only 100 and PCP has prescribed her on Lipitor 10 mg Last labs show excellent results  Lab Results  Component Value Date   CHOL 148 07/04/2021   HDL 85.00 07/04/2021   LDLCALC 55 07/04/2021   TRIG 37.0 07/04/2021   CHOLHDL 2 07/04/2021   No history of hypertension      Examination:   BP 120/76   Pulse 76   Ht 5' 5.5" (1.664 m)   Wt 157 lb 9.6 oz (71.5 kg)   SpO2 97%   BMI 25.83 kg/m   Body mass index is 25.83 kg/m.    Assesment:   DIABETES type 2:  See history of present illness for detailed discussion of  current management, blood sugar patterns and problems identified Her CGM data was analyzed on download  Her A1c is higher at 7.5 compared to 6.9  She is on the Omni pod insulin pump using [place]   Her blood sugars are being monitored now with a fingersticks  As above she has continued variability in her blood sugars and more difficult to control with only checking fingersticks  She has periods of high and low blood sugars  Hypoglycemia is mostly related to excessive boluses over having exercise at the same time as bolusing High blood sugars are probably related to  excessive carbohydrate intake at times at meals or snacks and possibly on balanced meals with less protein She is a good candidate for the OmniPod 5 pump but also needs to start back on a CGM that is accurate  Recommendations today: Target will be 120-130 instead of 100 for blood sugar correction Correction factor I: 70 instead of 60 She will try to only bolus the minimum amount to cover her intended carbohydrate intake and may do supplemental boluses later for any excess carbohydrates, this will hopefully help avoid some low sugars Reduce boluses 1 to 2 units if planning to be active after the meal Avoid overtreating low sugars with excessive carbohydrate or any snacks She will be given information to link her PDM to our online account Continue regular walking for exercise She will get on the OmniPod website to see if she will be eligible for the new OmniPod 5 pump but also will send prescription for this If not we will also send prescription for Dexcom Continue Farxiga unchanged at 5 mg  HYPERCHOLESTEROLEMIA: Adequately controlled and she will continue Lipitor  Microalbumin normal   Follow-up in 3 months   Patient Instructions  Check [email or web address]  Target at 120 and correction threshold 130  Sensitivity 70 not 60  If fasting sugars >140 use 0.50 basal at 5 am      Elayne Snare 07/08/2021, 9:25 AM   Note: This office note was prepared with Dragon voice recognition system technology. Any transcriptional errors that result from this process are unintentional.

## 2021-07-08 ENCOUNTER — Other Ambulatory Visit: Payer: Self-pay

## 2021-07-08 ENCOUNTER — Encounter: Payer: Self-pay | Admitting: Endocrinology

## 2021-07-08 ENCOUNTER — Ambulatory Visit: Payer: BC Managed Care – PPO | Admitting: Endocrinology

## 2021-07-08 VITALS — BP 120/76 | HR 76 | Ht 65.5 in | Wt 157.6 lb

## 2021-07-08 DIAGNOSIS — Z794 Long term (current) use of insulin: Secondary | ICD-10-CM

## 2021-07-08 DIAGNOSIS — E7800 Pure hypercholesterolemia, unspecified: Secondary | ICD-10-CM

## 2021-07-08 DIAGNOSIS — E1165 Type 2 diabetes mellitus with hyperglycemia: Secondary | ICD-10-CM | POA: Diagnosis not present

## 2021-07-08 MED ORDER — DEXCOM G6 TRANSMITTER MISC
1.0000 | Freq: Once | 1 refills | Status: AC
Start: 2021-07-08 — End: 2021-07-08

## 2021-07-08 MED ORDER — DEXCOM G6 SENSOR MISC: 3 refills | Status: DC

## 2021-07-08 MED ORDER — OMNIPOD 5 G6 INTRO (GEN 5) KIT
1.0000 | PACK | Freq: Once | 0 refills | Status: AC
Start: 2021-07-08 — End: 2021-07-08

## 2021-07-08 NOTE — Patient Instructions (Addendum)
Check [email or web address]  Target at 120 and correction threshold 130  Sensitivity 70 not 60  If fasting sugars >140 use 0.50 basal at 5 am

## 2021-07-10 ENCOUNTER — Telehealth: Payer: Self-pay | Admitting: Pharmacy Technician

## 2021-07-10 NOTE — Telephone Encounter (Signed)
[facility] Patient Advocate Encounter  Prior Authorization for Mia Wilson has been approved.       [facility] will continue to follow.   Ronney Asters, CPhT Patient Advocate [facility] Phone: [phone] Fax:  [phone]

## 2021-07-17 ENCOUNTER — Other Ambulatory Visit: Payer: Self-pay | Admitting: Endocrinology

## 2021-07-22 ENCOUNTER — Telehealth: Payer: Self-pay | Admitting: Endocrinology

## 2021-07-22 NOTE — Telephone Encounter (Signed)
Insulin Disposable Pump (OMNIPOD DASH 5 PACK PODS) MISC  The [facility] in [place] no longer has this product and patient needs a refill sent to   [facility]   [address] [place], [place] [address]

## 2021-07-23 ENCOUNTER — Telehealth: Payer: Self-pay | Admitting: Endocrinology

## 2021-07-23 ENCOUNTER — Other Ambulatory Visit: Payer: Self-pay

## 2021-07-23 MED ORDER — OMNIPOD DASH PODS (GEN 4) MISC: 2 refills | Status: DC

## 2021-07-23 NOTE — Telephone Encounter (Signed)
Pt calling in stating that she has not been able to get her blood sugar under 200 in the last 24 hours. Last night 07/22/2021 at 8:51pm it was 367. Sugar at 8:00am on 07/23/2021 was 224, at 8:49 it is  250 now. Pt has been taking insulin all through the night and has not eat anything. Pt would like to know what to do.

## 2021-07-23 NOTE — Telephone Encounter (Signed)
Please advise 

## 2021-07-23 NOTE — Telephone Encounter (Signed)
Has she changed her pod since the blood sugars went up?  If not she needs to do this now.  Also is the insulin vial almost empty, may need another bottle.  If she has already changed her pump she can temporarily double her basal rates for 12 hours and boluses also.  Linda please see if if she has any technical problems

## 2021-07-23 NOTE — Telephone Encounter (Signed)
Faxed to refill to pharmacy

## 2021-08-16 ENCOUNTER — Other Ambulatory Visit: Payer: Self-pay | Admitting: Endocrinology

## 2021-10-07 ENCOUNTER — Other Ambulatory Visit: Payer: BC Managed Care – PPO

## 2021-10-09 ENCOUNTER — Other Ambulatory Visit: Payer: Self-pay

## 2021-10-09 ENCOUNTER — Encounter: Payer: Self-pay | Admitting: Endocrinology

## 2021-10-09 ENCOUNTER — Ambulatory Visit: Payer: BC Managed Care – PPO | Admitting: Endocrinology

## 2021-10-09 ENCOUNTER — Other Ambulatory Visit (INDEPENDENT_AMBULATORY_CARE_PROVIDER_SITE_OTHER): Payer: BC Managed Care – PPO

## 2021-10-09 VITALS — BP 108/70 | HR 76 | Ht 65.0 in | Wt 153.6 lb

## 2021-10-09 DIAGNOSIS — Z794 Long term (current) use of insulin: Secondary | ICD-10-CM

## 2021-10-09 DIAGNOSIS — E7800 Pure hypercholesterolemia, unspecified: Secondary | ICD-10-CM | POA: Diagnosis not present

## 2021-10-09 DIAGNOSIS — E1165 Type 2 diabetes mellitus with hyperglycemia: Secondary | ICD-10-CM | POA: Diagnosis not present

## 2021-10-09 LAB — BASIC METABOLIC PANEL
BUN: 11 mg/dL (ref 6–23)
CO2: 29 mEq/L (ref 19–32)
Calcium: 9.5 mg/dL (ref 8.4–10.5)
Chloride: 105 mEq/L (ref 96–112)
Creatinine, Ser: 0.88 mg/dL (ref 0.40–1.20)
GFR: 78.32 mL/min (ref >60.00)
Glucose, Bld: 118 mg/dL — ABNORMAL HIGH (ref 70–99)
Potassium: 4.4 mEq/L (ref 3.5–5.1)
Sodium: 141 mEq/L (ref 135–145)

## 2021-10-09 LAB — POCT GLYCOSYLATED HEMOGLOBIN (HGB A1C): Hemoglobin A1C: 6.7 % — AB (ref 4.0–5.6)

## 2021-10-09 MED ORDER — DAPAGLIFLOZIN PROPANEDIOL 10 MG PO TABS: 10.0000 mg | ORAL_TABLET | Freq: Every day | ORAL | 1 refills | Status: DC

## 2021-10-09 NOTE — Patient Instructions (Addendum)
Activity setting for exercise?  Carb ratio 10  Correction bolus if sugar stays high

## 2021-10-09 NOTE — Progress Notes (Signed)
[name], female   DOB: 1976/08/27, 45 y.o.   MRN: [id number]    Reason for Appointment: follow-up  History of Present Illness   Diagnosis: date of diagnosis: 2009, initially asymptomatic, has type 2 diabetes.   RECENT history:   Current treatment regimen:  OMNIPOD 5 insulin pump  BASAL RATES: Midnight = 0.3, 5 AM = 0.45, 9 AM = 0.3 and 7 PM = 0. 45   Bolus settings: I/C ratio 12 at  ISF: 60, Target: 110, with correction over 110.   Non-insulin hypoglycemic drugs: Farxiga 10 mg daily  A1c is at her best level of 6.7 compared to previous level at 7.5  Blood sugar patterns, management: She says she was able to upgrade to the OmniPod 5 pump about a month ago and transfer the settings on her own  She says she is wanting better control and has her target set at 110  Although she has better control her blood sugars do fluctuate postprandially as described in the CGM download report In the last week her blood sugars may not be consistently high  She will generally have higher readings and fairly significant spikes early postprandially if eating more carbohydrate or having any high glycemic foods or drinks such as [employer] latte  With higher fat foods such as muffins she may have prolonged high readings postprandially She does not appear to be taking any correction doses for unexpected high readings that are prolonged Also she is not clear about blood sugar targets 1 to 2 hours after eating  She also thinks that if she does not bolus a little ahead of time her blood sugars go up more quickly Breakfast meal is usually light and blood sugars are better controlled in the mornings She is trying to do some walking Has just started going to health club and is going to be doing more activities, currently she does not think her blood sugars get low during the exercise and blood sugars overall may improve later in the day She has taken her Wilder Glade daily and appears  to be taking 2 tablets of the 5 mg daily even though she has a 5 mg prescription  Side effects from medications: Diarrhea from metformin and abdominal discomfort  Dexcom G6 data and interpretation for the last 2 weeks  CGM use % of time 93  2-week average/GV 134/43  Time in range    35    %  % Time Above 180 11  % Time above 250 2  % Time Below 70 2   Generally blood sugars are much more variable starting late afternoon until midnight On average blood sugars are trending higher between 2-4 PM and only appear to be coming down after 11 PM POSTPRANDIAL readings after breakfast are mostly well controlled with only a couple of spikes Postprandial readings after her late lunch are highly variable with some relatively rapid rise in blood sugar Blood sugars going above target much more often in the first week of the data and less in the second week Blood sugars may not be going up after dinner also in the last week Over night blood sugars are usually fairly consistent with some episodes of mild hypoglycemia after 1 AM  highest average blood sugar 161 between 4-5 PM Lowest overall blood sugar on average 113 from 5-6 AM  Previous data:  Mildly increased fasting and noontime sugars overall with more variable readings in the afternoons and evenings Average blood sugar appears to be  the highest midday and late evening Hypoglycemia tends to be occurring between about 4 PM-midnight sporadically with 5 episodes  PRE-MEAL Morning Midday Early evening Night Overall  Glucose range:     43-480  Mean/median: 161 170 141 171 156      Meals: 3 meals per day, vegetarian, tries to get protein like nuts, [name] yogurt, soy, fish  Dinner usually at 7-8 pm.Cheese sandwiich, or vegetable soup with cheese, may have 1-2 starches per meal Sometimes will have  peanut butter sandwich for lunch  Dietician visit: Most recent: 02/2020  CDE visit: 10/15    Wt Readings from Last 3 Encounters:  10/09/21 153 lb  9.6 oz (69.7 kg)  07/08/21 157 lb 9.6 oz (71.5 kg)  01/23/21 155 lb (70.3 kg)    LABS:  Lab Results  Component Value Date   HGBA1C 6.7 (A) 10/09/2021   HGBA1C 7.5 (H) 07/04/2021   HGBA1C 6.9 (H) 01/20/2021   Lab Results  Component Value Date   MICROALBUR <0.7 07/04/2021   LDLCALC 55 07/04/2021   CREATININE 0.88 10/09/2021       Office Visit on 10/09/2021  Component Date Value Ref Range Status   Hemoglobin A1C 10/09/2021 6.7 (A)  4.0 - 5.6 % Final  Lab on 10/09/2021  Component Date Value Ref Range Status   Sodium 10/09/2021 141  135 - 145 mEq/L Final   Potassium 10/09/2021 4.4  3.5 - 5.1 mEq/L Final   Chloride 10/09/2021 105  96 - 112 mEq/L Final   CO2 10/09/2021 29  19 - 32 mEq/L Final   Glucose, Bld 10/09/2021 118 (H)  70 - 99 mg/dL Final   BUN 10/09/2021 11  6 - 23 mg/dL Final   Creatinine, Ser 10/09/2021 0.88  0.40 - 1.20 mg/dL Final   GFR 10/09/2021 78.32  >60.00 mL/min Final   Calculated using the CKD-EPI Creatinine Equation (2021)   Calcium 10/09/2021 9.5  8.4 - 10.5 mg/dL Final    Allergies as of 10/09/2021       Reactions   Other Anaphylaxis   Peaches , tomatoes, strawberry   Strawberry Extract Hives   Facial hives and mouth hives   Tomato Hives   Facial hives and mouth hives        Medication List        Accurate as of October 09, 2021  1:27 PM. If you have any questions, ask your nurse or doctor.          atorvastatin 10 MG tablet Commonly known as: LIPITOR Take 10 mg by mouth daily.   buPROPion 150 MG 24 hr tablet Commonly known as: WELLBUTRIN XL Take 150 mg by mouth every morning.   Dexcom G6 Sensor Misc Use to monitor blood sugar, change after 10 days   DULoxetine 60 MG capsule Commonly known as: CYMBALTA Take 60 mg by mouth daily.   Farxiga 5 MG Tabs tablet Generic drug: dapagliflozin propanediol TAKE 5 MG BY MOUTH DAILY.   Fiasp 100 UNIT/ML Soln Generic drug: Insulin Aspart (w/Niacinamide) INJECT 30 UNITS INTO THE SKIN  DAILY. USE MAX OF 30 UNITS DAILY VIA INSULIN PUMP   multivitamin with minerals Tabs tablet Take 1 tablet by mouth daily.   Omnipod 5 G6 Pod (Gen 5) Misc by Does not apply route.   Omnipod DASH Pods (Gen 4) Misc CHANGE 1 POD EVERY 72 HOURS AS DIRECTED   OneTouch Delica Plus [id number] Misc 1 each by Does not apply route See admin instructions. Use Onetouch Delica Plus lancets  to check blood sugar before and after each meal.   OneTouch Verio Flex System w/Device Kit CHECK BLOOD SUGAR BEFORE AND AFTER EACH MEAL   OneTouch Verio test strip Generic drug: glucose blood USE ONETOUCH VERIO FLEX AS INSTRUCTED TO CHECK BLOOD SUGAR BEFORE AND AFTER EACH MEAL.   vitamin B-12 1000 MCG tablet Commonly known as: CYANOCOBALAMIN Take 1,000 mcg by mouth daily.        Allergies:  Allergies  Allergen Reactions   Other Anaphylaxis    Peaches , tomatoes, strawberry   Strawberry Extract Hives    Facial hives and mouth hives   Tomato Hives    Facial hives and mouth hives    Past Medical History:  Diagnosis Date   Back pain    Diabetes mellitus    Fibroids    PCOS (polycystic ovarian syndrome)     Past Surgical History:  Procedure Laterality Date   BREAST EXCISIONAL BIOPSY Right    BREAST SURGERY     benign   IR ANGIOGRAM PELVIS SELECTIVE OR SUPRASELECTIVE  11/17/2018   IR ANGIOGRAM PELVIS SELECTIVE OR SUPRASELECTIVE  11/17/2018   IR ANGIOGRAM SELECTIVE EACH ADDITIONAL VESSEL  11/17/2018   IR ANGIOGRAM SELECTIVE EACH ADDITIONAL VESSEL  11/17/2018   IR EMBO TUMOR ORGAN ISCHEMIA INFARCT INC GUIDE ROADMAPPING  11/17/2018   IR RADIOLOGIST EVAL & MGMT  09/14/2018   IR RADIOLOGIST EVAL & MGMT  12/13/2018   IR RADIOLOGIST EVAL & MGMT  06/28/2019   IR US GUIDE VASC ACCESS RIGHT  11/17/2018   UTERINE FIBROID SURGERY      Family History  Problem Relation Age of Onset   Hyperlipidemia Other    Hypertension Other    Stroke Other    Cancer Other    Diabetes Maternal Uncle    Heart  disease Paternal Uncle    Diabetes Maternal Grandmother    Heart disease Maternal Grandmother    Breast cancer Maternal Grandmother        diagnosed in her 34's   Diabetes Paternal Grandmother     Social History:  reports that she has never smoked. She has never used smokeless tobacco. She reports that she does not drink alcohol and does not use drugs.  Review of Systems -  HYPERCHOLESTEROLEMIA: This has been variable prior to treatment  Baseline LDL was only 100 and PCP has prescribed her on Lipitor 10 mg Last labs show excellent results  Lab Results  Component Value Date   CHOL 148 07/04/2021   HDL 85.00 07/04/2021   LDLCALC 55 07/04/2021   TRIG 37.0 07/04/2021   CHOLHDL 2 07/04/2021   No history of hypertension or microalbuminuria      Examination:   BP 108/70   Pulse 76   Ht _0  (1.651 m)   Wt 153 lb 9.6 oz (69.7 kg)   SpO2 99%   BMI 25.56 kg/m   Body mass index is 25.56 kg/m.    Assesment:   DIABETES type 2:  See history of present illness for detailed discussion of  current management, blood sugar patterns and problems identified Her CGM data was analyzed on download  Her A1c is better at 6.7  She is on the Omni pod 5 insulin pump using [place]  With starting the OmniPod 5 pump along with the Dexcom her blood sugars are generally better Also may be benefiting from 10 mg Farxiga  Her blood sugars are difficult to control postprandially with frequent blood sugar spikes after meals This  is more difficult because of her vegetarian diet and may not be getting enough protein or consistent amount of protein every time Although patterns are not consistent she appears to be mostly been exceeding her postprandial target with current carbohydrate ratio of 1:12 Currently not getting hypoglycemic usually with her blood sugar target of 110  Unclear if relatively low readings at times before dinnertime are related to increased physical  activity  Recommendations today: Carbohydrate coverage 1: 10 No change in correction factor as yet If she tends to have relatively low readings before dinnertime may consider reducing her target and use 120 instead of 110 as a target May need correction doses with extra boluses if eating high fat meals or snacks Consistently try to bolus ahead of time High-protein consistently at each meal May need to try activity mode for exercise if blood sugars are trending lower at the end of exercise Continue Farxiga unchanged at 10 mg  Call if having any unusual blood sugar patterns   Follow-up in 3 months   Patient Instructions  Activity setting for exercise?  Carb ratio 10  Correction bolus if sugar stays high      Elayne Snare 10/09/2021, 1:27 PM   Note: This office note was prepared with Dragon voice recognition system technology. Any transcriptional errors that result from this process are unintentional.

## 2021-11-04 ENCOUNTER — Other Ambulatory Visit: Payer: Self-pay | Admitting: Endocrinology

## 2021-11-18 ENCOUNTER — Other Ambulatory Visit: Payer: Self-pay

## 2021-11-18 ENCOUNTER — Telehealth: Payer: Self-pay | Admitting: Endocrinology

## 2021-11-18 DIAGNOSIS — E1165 Type 2 diabetes mellitus with hyperglycemia: Secondary | ICD-10-CM

## 2021-11-18 MED ORDER — DEXCOM G6 SENSOR MISC: 3 refills | Status: DC

## 2021-11-18 NOTE — Telephone Encounter (Signed)
MEDICATION: Continuous Blood Gluc Sensor (DEXCOM G6 SENSOR) MISC  PHARMACY:   [id number] [address], [place] Phone:  [phone]  Fax:  [phone]      HAS THE PATIENT CONTACTED THEIR PHARMACY?  Requires new RX  IS THIS A 90 DAY SUPPLY : No  IS PATIENT OUT OF MEDICATION: Yes  IF NOT; HOW MUCH IS LEFT: 0  LAST APPOINTMENT DATE: @12 /13/2022  NEXT APPOINTMENT DATE:@2 /20/2023  DO WE HAVE YOUR PERMISSION TO LEAVE A DETAILED MESSAGE?:Yes   OTHER COMMENTS:    **Let patient know to contact pharmacy at the end of the day to make sure medication is ready. **  ** Please notify patient to allow 48-72 hours to process**  **Encourage patient to contact the pharmacy for refills or they can request refills through [facility]**

## 2021-11-18 NOTE — Telephone Encounter (Signed)
Rx sent to preferred pharmacy.

## 2021-11-25 ENCOUNTER — Telehealth: Payer: Self-pay | Admitting: Endocrinology

## 2021-11-25 NOTE — Telephone Encounter (Signed)
MEDICATION: Insulin Disposable Pump (OMNIPOD 5 G6 POD, GEN 5,) MISC  PHARMACY:   [id number] [address], [place] Phone:  [phone]  Fax:  [phone]      HAS THE PATIENT CONTACTED THEIR PHARMACY?    IS THIS A 90 DAY SUPPLY : 30 days  IS PATIENT OUT OF MEDICATION: yes  IF NOT; HOW MUCH IS LEFT:   LAST APPOINTMENT DATE: @12 /27/2022  NEXT APPOINTMENT DATE:@2 /20/2023  DO WE HAVE YOUR PERMISSION TO LEAVE A DETAILED MESSAGE?:  OTHER COMMENTS:    **Let patient know to contact pharmacy at the end of the day to make sure medication is ready. **  ** Please notify patient to allow 48-72 hours to process**  **Encourage patient to contact the pharmacy for refills or they can request refills through [facility]**

## 2021-11-26 ENCOUNTER — Other Ambulatory Visit: Payer: Self-pay

## 2021-11-26 DIAGNOSIS — E1165 Type 2 diabetes mellitus with hyperglycemia: Secondary | ICD-10-CM

## 2021-11-26 MED ORDER — OMNIPOD 5 DEXG7G6 PODS GEN 5 MISC: 1.0000 | 2 refills | Status: DC

## 2021-11-26 NOTE — Telephone Encounter (Signed)
Rx sent in to preferred pharmacy

## 2021-12-16 ENCOUNTER — Other Ambulatory Visit: Payer: Self-pay | Admitting: Physician Assistant

## 2021-12-16 DIAGNOSIS — Z1231 Encounter for screening mammogram for malignant neoplasm of breast: Secondary | ICD-10-CM

## 2021-12-18 ENCOUNTER — Ambulatory Visit: Payer: BC Managed Care – PPO | Admitting: Podiatry

## 2021-12-18 ENCOUNTER — Ambulatory Visit
Admission: RE | Admit: 2021-12-18 | Discharge: 2021-12-18 | Disposition: A | Discharge status: Home / Self Care | Payer: BC Managed Care – PPO | Source: Ambulatory Visit

## 2021-12-18 ENCOUNTER — Other Ambulatory Visit: Payer: Self-pay

## 2021-12-18 ENCOUNTER — Encounter: Payer: Self-pay | Admitting: Podiatry

## 2021-12-18 DIAGNOSIS — Z1231 Encounter for screening mammogram for malignant neoplasm of breast: Secondary | ICD-10-CM

## 2021-12-18 DIAGNOSIS — E139 Other specified diabetes mellitus without complications: Secondary | ICD-10-CM

## 2021-12-18 DIAGNOSIS — Q828 Other specified congenital malformations of skin: Secondary | ICD-10-CM | POA: Diagnosis not present

## 2021-12-21 NOTE — Progress Notes (Signed)
Subjective:   [name], female   DOB: 46 y.o.   MRN: [id number]   HPI Patient presents concerned about long-term diabetes and foot issues and also a lesion bottom of the left foot that she is not sure what it may be.  States it does not get significantly sore but it feels unusual and she wanted checked.  Patient does not smoke and had diabetes for a number years with A1c dropping below 7 at this time with good control   Review of Systems  All other systems reviewed and are negative.      Objective:  Physical Exam Vitals and nursing note reviewed.  Constitutional:      Appearance: She is well-developed.  Pulmonary:     Effort: Pulmonary effort is normal.  Musculoskeletal:        General: Normal range of motion.  Skin:    General: Skin is warm.  Neurological:     Mental Status: She is alert.    Neurovascular status intact muscle strength adequate range of motion adequate.  Patient is noted to have lesion plantar aspect left and thorough examination of feet do not reveal any forms of diabetic neuropathy or indications of vascular compromise.  The lesion is about 5 x 5 mm with a lucent core     Assessment:  Doing well with long-term diabetes with porokeratotic plantar left     Plan:  H&P reviewed condition sterile debridement of lesion left no iatrogenic bleeding and recommended continue daily inspections of feet and excellent control of diabetes and I do not believe she will have any issues associated with her feet

## 2022-01-12 ENCOUNTER — Other Ambulatory Visit (HOSPITAL_COMMUNITY): Payer: Self-pay

## 2022-01-12 ENCOUNTER — Other Ambulatory Visit (INDEPENDENT_AMBULATORY_CARE_PROVIDER_SITE_OTHER): Payer: BC Managed Care – PPO

## 2022-01-12 ENCOUNTER — Other Ambulatory Visit: Payer: BC Managed Care – PPO

## 2022-01-12 ENCOUNTER — Telehealth: Payer: Self-pay | Admitting: Pharmacy Technician

## 2022-01-12 DIAGNOSIS — E7800 Pure hypercholesterolemia, unspecified: Secondary | ICD-10-CM | POA: Diagnosis not present

## 2022-01-12 DIAGNOSIS — Z794 Long term (current) use of insulin: Secondary | ICD-10-CM | POA: Diagnosis not present

## 2022-01-12 DIAGNOSIS — E1165 Type 2 diabetes mellitus with hyperglycemia: Secondary | ICD-10-CM | POA: Diagnosis not present

## 2022-01-12 LAB — COMPREHENSIVE METABOLIC PANEL
ALT: 19 U/L (ref 0–35)
AST: 23 U/L (ref 0–37)
Albumin: 4.5 g/dL (ref 3.5–5.2)
Alkaline Phosphatase: 107 U/L (ref 39–117)
BUN: 11 mg/dL (ref 6–23)
CO2: 29 mEq/L (ref 19–32)
Calcium: 9.9 mg/dL (ref 8.4–10.5)
Chloride: 98 mEq/L (ref 96–112)
Creatinine, Ser: 0.88 mg/dL (ref 0.40–1.20)
GFR: 78.17 mL/min (ref >60.00)
Glucose, Bld: 79 mg/dL (ref 70–99)
Potassium: 4.4 mEq/L (ref 3.5–5.1)
Sodium: 136 mEq/L (ref 135–145)
Total Bilirubin: 1.2 mg/dL (ref 0.2–1.2)
Total Protein: 7.9 g/dL (ref 6.0–8.3)

## 2022-01-12 LAB — LIPID PANEL
Cholesterol: 165 mg/dL (ref 0–200)
HDL: 93.4 mg/dL (ref >39.00)
LDL Cholesterol: 63 mg/dL (ref 0–99)
NonHDL: 72.05
Total CHOL/HDL Ratio: 2
Triglycerides: 44 mg/dL (ref 0.0–149.0)
VLDL: 8.8 mg/dL (ref 0.0–40.0)

## 2022-01-12 LAB — HEMOGLOBIN A1C: Hgb A1c MFr Bld: 6.6 % — ABNORMAL HIGH (ref 4.6–6.5)

## 2022-01-12 NOTE — Telephone Encounter (Signed)
Patient Advocate Encounter  Received notification from [employer] that prior authorization for FARXIGA 10MG  is required.   PA submitted on 2.20.23 Key [id number] Status is pending   [facility] will continue to follow  Luciano Cutter, CPhT Patient Advocate [facility] Phone: [phone] Fax:  [phone]

## 2022-01-15 ENCOUNTER — Other Ambulatory Visit: Payer: Self-pay

## 2022-01-15 ENCOUNTER — Encounter: Payer: Self-pay | Admitting: Endocrinology

## 2022-01-15 ENCOUNTER — Ambulatory Visit: Payer: BC Managed Care – PPO | Admitting: Endocrinology

## 2022-01-15 VITALS — BP 106/72 | HR 77 | Ht 65.5 in | Wt 160.0 lb

## 2022-01-15 DIAGNOSIS — E1165 Type 2 diabetes mellitus with hyperglycemia: Secondary | ICD-10-CM | POA: Diagnosis not present

## 2022-01-15 DIAGNOSIS — Z794 Long term (current) use of insulin: Secondary | ICD-10-CM | POA: Diagnosis not present

## 2022-01-15 DIAGNOSIS — E7800 Pure hypercholesterolemia, unspecified: Secondary | ICD-10-CM

## 2022-01-15 NOTE — Progress Notes (Signed)
[name], female   DOB: 1976/04/29, 46 y.o.   MRN: [id number]    Reason for Appointment: follow-up  History of Present Illness   Diagnosis: date of diagnosis: 2009, initially asymptomatic, has type 2 diabetes.   RECENT history:   Current treatment regimen:  OMNIPOD 5 insulin pump  BASAL RATES: Midnight = 0.3, 5 AM = 0.45, 9 AM = 0.3 and 7 PM = 0. 45   Bolus settings: I/C ratio 10 at  ISF: 60, Target: 110, with correction over 110.   Non-insulin hypoglycemic drugs: Farxiga 10 mg daily and evening  A1c is at her best level of 6.6  Blood sugar patterns, management: She has generally fairly good blood sugar control as shown on the Dexcom download explained below Some of her hyperglycemia is related to that lunch at least to not getting much protein and mostly carbohydrates Although she is bolusing 5 to 10-minute before eating usually she will sometimes have relatively rapid increase in blood sugar postprandially at lunch This is despite adjusting her carbohydrate ratio to 1: 10 Appears that she may have missed sporadic boluses in the evenings at dinnertime for meals or snacks She will try to make some corrections for high readings postprandially but only occasionally will tend to get low because of this Generally tends to be lower before dinnertime with occasional hypoglycemia which may or may not be related to increased physical activity Overnight blood sugars are very consistently controlled with the automated mode This has been active 99% of the time She is trying to be fairly active with walking regularly However her weight is slightly higher compared to last year  Side effects from medications: Diarrhea from metformin and abdominal discomfort  Dexcom G6 data and interpretation for the last 2 weeks  Modest variability is present related to inconsistent postprandial readings especially after lunch HYPERGLYCEMIC episodes are seen periodically  after lunch and dinner and occasionally late at night, rarely after breakfast She may sporadically have low blood sugars around 6 PM but usually transiently Otherwise HYPOGLYCEMIA has been fairly consistently prevented by the automated mode with several areas of basal suspend on her profile OVERNIGHT blood sugars are generally very consistently close to 110 between 3 AM and 9 AM Pre-meal blood sugars are generally close to target On an average postprandial readings are below the 180 target but may be on an average higher late in the evening   CGM use % of time 99  2-week average/GV 138/32  Time in range 85       %  % Time Above 180 1+3  % Time above 250   % Time Below 70 1      PREVIOUS data:  CGM use % of time 93  2-week average/GV 134/43  Time in range    35    %  % Time Above 180 11  % Time above 250 2  % Time Below 70 2         Meals: 3 meals per day, vegetarian, tries to get protein like nuts, [name] yogurt, soy, fish  Dinner usually at 7-8 pm.Cheese sandwiich, or vegetable soup with cheese, may have 1-2 starches per meal Sometimes will have  peanut butter sandwich for lunch  Dietician visit: Most recent: 02/2020  CDE visit: 10/15    Wt Readings from Last 3 Encounters:  01/15/22 160 lb (72.6 kg)  10/09/21 153 lb 9.6 oz (69.7 kg)  07/08/21 157 lb 9.6 oz (71.5 kg)  LABS:  Lab Results  Component Value Date   HGBA1C 6.6 (H) 01/12/2022   HGBA1C 6.7 (A) 10/09/2021   HGBA1C 7.5 (H) 07/04/2021   Lab Results  Component Value Date   MICROALBUR <0.7 07/04/2021   LDLCALC 63 01/12/2022   CREATININE 0.88 01/12/2022       Appointment on 01/12/2022  Component Date Value Ref Range Status   Hgb A1c MFr Bld 01/12/2022 6.6 (H)  4.6 - 6.5 % Final   Glycemic Control Guidelines for People with Diabetes:Non Diabetic:  <6%Goal of Therapy: <7%Additional Action Suggested:  >8%    Cholesterol 01/12/2022 165  0 - 200 mg/dL Final   ATP III Classification       Desirable:  <  200 mg/dL               Borderline High:  200 - 239 mg/dL          High:  > = 063 mg/dL   Triglycerides 10/86/0691 44.0  0.0 - 149.0 mg/dL Final   Normal:  <975 mg/dLBorderline High:  150 - 199 mg/dL   HDL 01/57/8593 53.34  >39.00 mg/dL Final   VLDL 35/56/6583 8.8  0.0 - 33.3 mg/dL Final   LDL Cholesterol 01/12/2022 63  0 - 99 mg/dL Final   Total CHOL/HDL Ratio 01/12/2022 2   Final                  Men          Women1/2 Average Risk     3.4          3.3Average Risk          5.0          4.42X Average Risk          9.6          7.13X Average Risk          15.0          11.0                       NonHDL 01/12/2022 72.05   Final   NOTE:  Non-HDL goal should be 30 mg/dL higher than patient's LDL goal (i.e. LDL goal of < 70 mg/dL, would have non-HDL goal of < 100 mg/dL)   Sodium 56/94/6713 951  135 - 145 mEq/L Final   Potassium 01/12/2022 4.4  3.5 - 5.1 mEq/L Final   Chloride 01/12/2022 98  96 - 112 mEq/L Final   CO2 01/12/2022 29  19 - 32 mEq/L Final   Glucose, Bld 01/12/2022 79  70 - 99 mg/dL Final   BUN 44/00/2882 11  6 - 23 mg/dL Final   Creatinine, Ser 01/12/2022 0.88  0.40 - 1.20 mg/dL Final   Total Bilirubin 01/12/2022 1.2  0.2 - 1.2 mg/dL Final   Alkaline Phosphatase 01/12/2022 107  39 - 117 U/L Final   AST 01/12/2022 23  0 - 37 U/L Final   ALT 01/12/2022 19  0 - 35 U/L Final   Total Protein 01/12/2022 7.9  6.0 - 8.3 g/dL Final   Albumin 17/65/9539 4.5  3.5 - 5.2 g/dL Final   GFR 40/11/5630 78.17  >60.00 mL/min Final   Calculated using the CKD-EPI Creatinine Equation (2021)   Calcium 01/12/2022 9.9  8.4 - 10.5 mg/dL Final    Allergies as of 01/15/2022       Reactions   Other Anaphylaxis   Peaches , tomatoes, strawberry  Strawberry Extract Hives   Facial hives and mouth hives   Tomato Hives   Facial hives and mouth hives        Medication List        Accurate as of January 15, 2022  3:40 PM. If you have any questions, ask your nurse or doctor.           atorvastatin 10 MG tablet Commonly known as: LIPITOR Take 10 mg by mouth daily.   buPROPion 150 MG 24 hr tablet Commonly known as: WELLBUTRIN XL Take 150 mg by mouth every morning.   dapagliflozin propanediol 10 MG Tabs tablet Commonly known as: Farxiga Take 1 tablet (10 mg total) by mouth daily before breakfast.   Dexcom G6 Sensor Misc Use to monitor blood sugar, change after 10 days   Dexcom G6 Transmitter Misc AS DIRECTED   DULoxetine 60 MG capsule Commonly known as: CYMBALTA Take 60 mg by mouth daily.   Fiasp 100 UNIT/ML Soln Generic drug: Insulin Aspart (w/Niacinamide) INJECT 30 UNITS INTO THE SKIN DAILY. USE MAX OF 30 UNITS DAILY VIA INSULIN PUMP What changed: See the new instructions.   multivitamin with minerals Tabs tablet Take 1 tablet by mouth daily.   Omnipod DASH Pods (Gen 4) Misc CHANGE 1 POD EVERY 72 HOURS AS DIRECTED   Omnipod 5 G6 Pod (Gen 5) Misc 1 Device by Does not apply route every 3 (three) days.   OneTouch Delica Plus [id number] Misc 1 each by Does not apply route See admin instructions. Use Onetouch Delica Plus lancets to check blood sugar before and after each meal.   OneTouch Verio Flex System w/Device Kit CHECK BLOOD SUGAR BEFORE AND AFTER EACH MEAL   OneTouch Verio test strip Generic drug: glucose blood USE ONETOUCH VERIO FLEX AS INSTRUCTED TO CHECK BLOOD SUGAR BEFORE AND AFTER EACH MEAL.   vitamin B-12 1000 MCG tablet Commonly known as: CYANOCOBALAMIN Take 1,000 mcg by mouth daily.        Allergies:  Allergies  Allergen Reactions   Other Anaphylaxis    Peaches , tomatoes, strawberry   Strawberry Extract Hives    Facial hives and mouth hives   Tomato Hives    Facial hives and mouth hives    Past Medical History:  Diagnosis Date   Back pain    Diabetes mellitus    Fibroids    PCOS (polycystic ovarian syndrome)     Past Surgical History:  Procedure Laterality Date   BREAST EXCISIONAL BIOPSY Right    BREAST  SURGERY     benign   IR ANGIOGRAM PELVIS SELECTIVE OR SUPRASELECTIVE  11/17/2018   IR ANGIOGRAM PELVIS SELECTIVE OR SUPRASELECTIVE  11/17/2018   IR ANGIOGRAM SELECTIVE EACH ADDITIONAL VESSEL  11/17/2018   IR ANGIOGRAM SELECTIVE EACH ADDITIONAL VESSEL  11/17/2018   IR EMBO TUMOR ORGAN ISCHEMIA INFARCT INC GUIDE ROADMAPPING  11/17/2018   IR RADIOLOGIST EVAL & MGMT  09/14/2018   IR RADIOLOGIST EVAL & MGMT  12/13/2018   IR RADIOLOGIST EVAL & MGMT  06/28/2019   IR US GUIDE VASC ACCESS RIGHT  11/17/2018   UTERINE FIBROID SURGERY      Family History  Problem Relation Age of Onset   Hyperlipidemia Other    Hypertension Other    Stroke Other    Cancer Other    Diabetes Maternal Uncle    Heart disease Paternal Uncle    Diabetes Maternal Grandmother    Heart disease Maternal Grandmother    Breast cancer Maternal Grandmother  diagnosed in her 51's   Diabetes Paternal Grandmother     Social History:  reports that she has never smoked. She has never used smokeless tobacco. She reports that she does not drink alcohol and does not use drugs.  Review of Systems -  HYPERCHOLESTEROLEMIA: This has been variable prior to treatment  Baseline LDL was only 100 and PCP has prescribed her on Lipitor 10 mg Last lipid profile shows excellent results  Lab Results  Component Value Date   CHOL 165 01/12/2022   HDL 93.40 01/12/2022   LDLCALC 63 01/12/2022   TRIG 44.0 01/12/2022   CHOLHDL 2 01/12/2022   No history of hypertension or microalbuminuria      Examination:   BP 106/72    Pulse 77    Ht 5' 5.5" (1.664 m)    Wt 160 lb (72.6 kg)    LMP 11/10/2018 (Approximate)    SpO2 99%    BMI 26.22 kg/m   Body mass index is 26.22 kg/m.    Assesment:   DIABETES type 2:  See history of present illness for detailed discussion of  current management, blood sugar patterns and problems identified Her CGM data was analyzed on download  Her A1c is better at 6.6  She is on the Omni pod 5 insulin  pump using Fiasp and [place]  Currently is getting 85% of her readings within target As discussed above her blood sugar control is excellent overnight with the target of 110 However this may be causing her sugars to be low normal at dinnertime possibly because of periodic tendency to high readings in the afternoons after lunch and the pump not compensating quickly enough Her postprandial blood sugars can be better if she has consistent amount of protein especially at lunchtime Also will try to be more consistent with estimating her carbohydrates more accurately  Also occasionally may be missing occasional boluses in the evenings as seen on her pump download data She is trying to be fairly conscious of her blood sugar readings and trying to keep them well controlled However is tending to gain some weight, this is despite continuing [place]   Recommendations today: Carbohydrate coverage 1: 9 at lunch but if she still having high readings go to 1: 8 Continue 1: 10 coverage at dinnertime She will make sure she boluses consistently for all carbohydrate intake with meals and snacks in the evening She will change her target glucose between 3 PM-6 PM to 130 instead of 110  No change in correction factor but advised her not to stack boluses or take correction boluses postprandially unless blood sugars continue to rise   LIPIDS: Well-controlled with Lipitor 10 mg  Follow-up in 4 months   There are no Patient Instructions on file for this visit.      Elayne Snare 01/15/2022, 3:40 PM   Note: This office note was prepared with Dragon voice recognition system technology. Any transcriptional errors that result from this process are unintentional.

## 2022-01-15 NOTE — Patient Instructions (Signed)
Protein at lunch daily

## 2022-01-16 ENCOUNTER — Other Ambulatory Visit: Payer: Self-pay | Admitting: Endocrinology

## 2022-01-16 NOTE — Telephone Encounter (Signed)
Received notification from [employer] ([employer]) regarding a prior authorization for FARXIGA 10MG . Authorization has been APPROVED from 1.21.23 to 2.20.24.    Authorization #  PA Case ID: [id number]

## 2022-01-17 ENCOUNTER — Other Ambulatory Visit: Payer: Self-pay | Admitting: Endocrinology

## 2022-01-19 ENCOUNTER — Other Ambulatory Visit (HOSPITAL_COMMUNITY): Payer: Self-pay

## 2022-01-20 ENCOUNTER — Telehealth: Payer: Self-pay

## 2022-01-20 DIAGNOSIS — E1165 Type 2 diabetes mellitus with hyperglycemia: Secondary | ICD-10-CM

## 2022-01-20 MED ORDER — LYUMJEV 100 UNIT/ML IJ SOLN: INTRAMUSCULAR | 2 refills | Status: DC

## 2022-01-20 NOTE — Telephone Encounter (Signed)
Rx sent to pharmacy   

## 2022-01-20 NOTE — Telephone Encounter (Signed)
Per PA team Lyumjev is covered not Fiasp. Is it ok to send? If so, are instructions the same?

## 2022-01-23 ENCOUNTER — Encounter: Payer: Self-pay | Admitting: Endocrinology

## 2022-02-07 DIAGNOSIS — J029 Acute pharyngitis, unspecified: Secondary | ICD-10-CM | POA: Diagnosis not present

## 2022-04-09 ENCOUNTER — Other Ambulatory Visit: Payer: Self-pay | Admitting: Endocrinology

## 2022-04-09 DIAGNOSIS — E1165 Type 2 diabetes mellitus with hyperglycemia: Secondary | ICD-10-CM

## 2022-05-11 ENCOUNTER — Other Ambulatory Visit (INDEPENDENT_AMBULATORY_CARE_PROVIDER_SITE_OTHER): Payer: BC Managed Care – PPO

## 2022-05-11 DIAGNOSIS — Z794 Long term (current) use of insulin: Secondary | ICD-10-CM | POA: Diagnosis not present

## 2022-05-11 DIAGNOSIS — E1165 Type 2 diabetes mellitus with hyperglycemia: Secondary | ICD-10-CM | POA: Diagnosis not present

## 2022-05-11 LAB — BASIC METABOLIC PANEL
BUN: 10 mg/dL (ref 6–23)
CO2: 26 mEq/L (ref 19–32)
Calcium: 9.3 mg/dL (ref 8.4–10.5)
Chloride: 103 mEq/L (ref 96–112)
Creatinine, Ser: 0.81 mg/dL (ref 0.40–1.20)
GFR: 86.15 mL/min (ref >60.00)
Glucose, Bld: 123 mg/dL — ABNORMAL HIGH (ref 70–99)
Potassium: 3.6 mEq/L (ref 3.5–5.1)
Sodium: 138 mEq/L (ref 135–145)

## 2022-05-11 LAB — MICROALBUMIN / CREATININE URINE RATIO
Creatinine,U: 119.5 mg/dL
Microalb Creat Ratio: 0.6 mg/g (ref 0.0–30.0)
Microalb, Ur: <0.7 mg/dL (ref 0.0–1.9)

## 2022-05-11 LAB — HEMOGLOBIN A1C: Hgb A1c MFr Bld: 7.4 % — ABNORMAL HIGH (ref 4.6–6.5)

## 2022-05-12 ENCOUNTER — Other Ambulatory Visit: Payer: BC Managed Care – PPO

## 2022-05-14 ENCOUNTER — Telehealth (INDEPENDENT_AMBULATORY_CARE_PROVIDER_SITE_OTHER): Payer: BC Managed Care – PPO | Admitting: Endocrinology

## 2022-05-14 ENCOUNTER — Encounter: Payer: Self-pay | Admitting: Endocrinology

## 2022-05-14 ENCOUNTER — Ambulatory Visit: Payer: BC Managed Care – PPO | Admitting: Endocrinology

## 2022-05-14 VITALS — Ht 66.0 in | Wt 157.0 lb

## 2022-05-14 DIAGNOSIS — Z794 Long term (current) use of insulin: Secondary | ICD-10-CM

## 2022-05-14 DIAGNOSIS — E7800 Pure hypercholesterolemia, unspecified: Secondary | ICD-10-CM | POA: Diagnosis not present

## 2022-05-14 DIAGNOSIS — E1165 Type 2 diabetes mellitus with hyperglycemia: Secondary | ICD-10-CM | POA: Diagnosis not present

## 2022-05-14 NOTE — Progress Notes (Unsigned)
[name], female   DOB: Apr 11, 1976, 46 y.o.   MRN: [id number]    Reason for Appointment: follow-up  History of Present Illness   Diagnosis: date of diagnosis: 2009, initially asymptomatic, has type 2 diabetes.   RECENT history:   Current treatment regimen:  OMNIPOD 5 insulin pump  BASAL RATES: Midnight = 0.3, 5 AM = 0.45, 9 AM = 0.3 and 7 PM = 0. 45   Bolus settings: I/C ratio 7 at  ISF: 60, Target: 110, with correction over 110.   Non-insulin hypoglycemic drugs: Farxiga 10 mg daily and evening  A1c is at her best level of 6.6  Blood sugar patterns, management: She has generally fairly good blood sugar control as shown on the Dexcom download explained below Some of her hyperglycemia is related to that lunch at least to not getting much protein and mostly carbohydrates Although she is bolusing 5 to 10-minute before eating usually she will sometimes have relatively rapid increase in blood sugar postprandially at lunch This is despite adjusting her carbohydrate ratio to 1: 10 Appears that she may have missed sporadic boluses in the evenings at dinnertime for meals or snacks She will try to make some corrections for high readings postprandially but only occasionally will tend to get low because of this Generally tends to be lower before dinnertime with occasional hypoglycemia which may or may not be related to increased physical activity Overnight blood sugars are very consistently controlled with the automated mode This has been active 99% of the time She is trying to be fairly active with walking regularly However her weight is slightly higher compared to last year  Side effects from medications: Diarrhea from metformin and abdominal discomfort  Dexcom G6 data and interpretation for the last 2 weeks  Modest variability is present related to inconsistent postprandial readings especially after lunch HYPERGLYCEMIC episodes are seen periodically  after lunch and dinner and occasionally late at night, rarely after breakfast She may sporadically have low blood sugars around 6 PM but usually transiently Otherwise HYPOGLYCEMIA has been fairly consistently prevented by the automated mode with several areas of basal suspend on her profile OVERNIGHT blood sugars are generally very consistently close to 110 between 3 AM and 9 AM Pre-meal blood sugars are generally close to target On an average postprandial readings are below the 180 target but may be on an average higher late in the evening   CGM use % of time 99  2-week average/GV 138/32  Time in range 85       %  % Time Above 180 1+3  % Time above 250   % Time Below 70 1      PREVIOUS data:  CGM use % of time 93  2-week average/GV 134/43  Time in range    35    %  % Time Above 180 11  % Time above 250 2  % Time Below 70 2         Meals: 3 meals per day, vegetarian, tries to get protein like nuts, [name] yogurt, soy, fish  Dinner usually at 7-8 pm.Cheese sandwiich, or vegetable soup with cheese, may have 1-2 starches per meal Sometimes will have  peanut butter sandwich for lunch  Dietician visit: Most recent: 02/2020  CDE visit: 10/15    Wt Readings from Last 3 Encounters:  05/14/22 157 lb (71.2 kg)  01/15/22 160 lb (72.6 kg)  10/09/21 153 lb 9.6 oz (69.7 kg)  LABS:  Lab Results  Component Value Date   HGBA1C 7.4 (H) 05/11/2022   HGBA1C 6.6 (H) 01/12/2022   HGBA1C 6.7 (A) 10/09/2021   Lab Results  Component Value Date   MICROALBUR <0.7 05/11/2022   LDLCALC 63 01/12/2022   CREATININE 0.81 05/11/2022       Lab on 05/11/2022  Component Date Value Ref Range Status   Microalb, Ur 05/11/2022 <0.7  0.0 - 1.9 mg/dL Final   Creatinine,U 05/11/2022 119.5  mg/dL Final   Microalb Creat Ratio 05/11/2022 0.6  0.0 - 30.0 mg/g Final   Sodium 05/11/2022 138  135 - 145 mEq/L Final   Potassium 05/11/2022 3.6  3.5 - 5.1 mEq/L Final   Chloride 05/11/2022 103  96 - 112  mEq/L Final   CO2 05/11/2022 26  19 - 32 mEq/L Final   Glucose, Bld 05/11/2022 123 (H)  70 - 99 mg/dL Final   BUN 05/11/2022 10  6 - 23 mg/dL Final   Creatinine, Ser 05/11/2022 0.81  0.40 - 1.20 mg/dL Final   GFR 05/11/2022 86.15  >60.00 mL/min Final   Calculated using the CKD-EPI Creatinine Equation (2021)   Calcium 05/11/2022 9.3  8.4 - 10.5 mg/dL Final   Hgb A1c MFr Bld 05/11/2022 7.4 (H)  4.6 - 6.5 % Final   Glycemic Control Guidelines for People with Diabetes:Non Diabetic:  <6%Goal of Therapy: <7%Additional Action Suggested:  >8%     Allergies as of 05/14/2022       Reactions   Other Anaphylaxis   Peaches , tomatoes, strawberry   Strawberry Extract Hives   Facial hives and mouth hives   Tomato Hives   Facial hives and mouth hives        Medication List        Accurate as of May 14, 2022 10:40 AM. If you have any questions, ask your nurse or doctor.          atorvastatin 10 MG tablet Commonly known as: LIPITOR Take 10 mg by mouth daily.   buPROPion 150 MG 24 hr tablet Commonly known as: WELLBUTRIN XL Take 150 mg by mouth every morning.   dapagliflozin propanediol 10 MG Tabs tablet Commonly known as: Farxiga Take 1 tablet (10 mg total) by mouth daily before breakfast.   Dexcom G6 Sensor Misc USE TO MONITOR BLOOD SUGAR, CHANGE AFTER 10 DAYS   Dexcom G6 Transmitter Misc AS DIRECTED   DULoxetine 60 MG capsule Commonly known as: CYMBALTA Take 60 mg by mouth daily.   Fiasp 100 UNIT/ML Soln Generic drug: Insulin Aspart (w/Niacinamide) INJECT 30 UNITS INTO THE SKIN DAILY. USE MAX OF 30 UNITS DAILY VIA INSULIN PUMP   Lyumjev 100 UNIT/ML Soln Generic drug: Insulin Lispro-aabc INJECT 30 UNITS INTO THE SKIN DAILY. USE MAX OF 30 UNITS DAILY VIA INSULIN PUMP   multivitamin with minerals Tabs tablet Take 1 tablet by mouth daily.   Omnipod DASH Pods (Gen 4) Misc CHANGE 1 POD EVERY 72 HOURS AS DIRECTED   Omnipod 5 G6 Pod (Gen 5) Misc 1 DEVICE BY DOES NOT  APPLY ROUTE EVERY 3 (THREE) DAYS.   OneTouch Delica Plus [id number] Misc 1 each by Does not apply route See admin instructions. Use Onetouch Delica Plus lancets to check blood sugar before and after each meal.   OneTouch Verio Flex System w/Device Kit CHECK BLOOD SUGAR BEFORE AND AFTER EACH MEAL   OneTouch Verio test strip Generic drug: glucose blood USE ONETOUCH VERIO FLEX AS INSTRUCTED TO CHECK BLOOD SUGAR BEFORE  AND AFTER EACH MEAL.   vitamin B-12 1000 MCG tablet Commonly known as: CYANOCOBALAMIN Take 1,000 mcg by mouth daily.        Allergies:  Allergies  Allergen Reactions   Other Anaphylaxis    Peaches , tomatoes, strawberry   Strawberry Extract Hives    Facial hives and mouth hives   Tomato Hives    Facial hives and mouth hives    Past Medical History:  Diagnosis Date   Back pain    Diabetes mellitus    Fibroids    PCOS (polycystic ovarian syndrome)     Past Surgical History:  Procedure Laterality Date   BREAST EXCISIONAL BIOPSY Right    BREAST SURGERY     benign   IR ANGIOGRAM PELVIS SELECTIVE OR SUPRASELECTIVE  11/17/2018   IR ANGIOGRAM PELVIS SELECTIVE OR SUPRASELECTIVE  11/17/2018   IR ANGIOGRAM SELECTIVE EACH ADDITIONAL VESSEL  11/17/2018   IR ANGIOGRAM SELECTIVE EACH ADDITIONAL VESSEL  11/17/2018   IR EMBO TUMOR ORGAN ISCHEMIA INFARCT INC GUIDE ROADMAPPING  11/17/2018   IR RADIOLOGIST EVAL & MGMT  09/14/2018   IR RADIOLOGIST EVAL & MGMT  12/13/2018   IR RADIOLOGIST EVAL & MGMT  06/28/2019   IR US GUIDE VASC ACCESS RIGHT  11/17/2018   UTERINE FIBROID SURGERY      Family History  Problem Relation Age of Onset   Hyperlipidemia Other    Hypertension Other    Stroke Other    Cancer Other    Diabetes Maternal Uncle    Heart disease Paternal Uncle    Diabetes Maternal Grandmother    Heart disease Maternal Grandmother    Breast cancer Maternal Grandmother        diagnosed in her 88's   Diabetes Paternal Grandmother     Social History:  reports  that she has never smoked. She has never used smokeless tobacco. She reports that she does not drink alcohol and does not use drugs.  Review of Systems -  HYPERCHOLESTEROLEMIA: This has been variable prior to treatment  Baseline LDL was only 100 and PCP has prescribed her on Lipitor 10 mg Last lipid profile shows excellent results  Lab Results  Component Value Date   CHOL 165 01/12/2022   HDL 93.40 01/12/2022   LDLCALC 63 01/12/2022   TRIG 44.0 01/12/2022   CHOLHDL 2 01/12/2022   No history of hypertension or microalbuminuria      Examination:   Ht _0  (1.676 m)   Wt 157 lb (71.2 kg)   LMP 11/10/2018 (Approximate)   BMI 25.34 kg/m   Body mass index is 25.34 kg/m.    Assesment:   DIABETES type 2:  See history of present illness for detailed discussion of  current management, blood sugar patterns and problems identified Her CGM data was analyzed on download  Her A1c is   She is on the Omni pod 5 insulin pump using Fiasp and [place]  Currently is getting 85% of her readings within target As discussed above her blood sugar control is excellent overnight with the target of 110 However this may be causing her sugars to be low normal at dinnertime possibly because of periodic tendency to high readings in the afternoons after lunch and the pump not compensating quickly enough Her postprandial blood sugars can be better if she has consistent amount of protein especially at lunchtime Also will try to be more consistent with estimating her carbohydrates more accurately  Also occasionally may be missing occasional boluses in the  evenings as seen on her pump download data She is trying to be fairly conscious of her blood sugar readings and trying to keep them well controlled However is tending to gain some weight, this is despite continuing [place]   Recommendations today: Carbohydrate coverage 1: 9 at dinnertime but if she still having high readings go to 1: 8 She will  need to cut back on high glycemic index foods with sweets as well as foods like pizza Try to bolus consistently before starting to eat, 5 to 10 minutes before  Avoid stacking boluses if the blood sugars are high and if the blood sugar goes up from late boluses only do a correction bolus Follow-up with the nutritionist/educator to help with mealtime management better need She will make sure she boluses consistently for all carbohydrate intake with meals and snacks in the evening Overall needs reduction in carbohydrate intake  No change in correction factor or target glucose Try to keep in the automated mode as much as possible Restart automated mode right away if sensor has been restarted after a break  LIPIDS: Follow-up on the next visit  Follow-up in 4 months   There are no Patient Instructions on file for this visit.      Mia Wilson 05/14/2022, 10:40 AM   Note: This office note was prepared with Dragon voice recognition system technology. Any transcriptional errors that result from this process are unintentional.

## 2022-05-18 ENCOUNTER — Other Ambulatory Visit: Payer: Self-pay | Admitting: Endocrinology

## 2022-05-20 ENCOUNTER — Encounter: Payer: Self-pay | Admitting: Endocrinology

## 2022-05-30 ENCOUNTER — Other Ambulatory Visit: Payer: Self-pay | Admitting: Endocrinology

## 2022-06-30 ENCOUNTER — Other Ambulatory Visit: Payer: Self-pay | Admitting: Endocrinology

## 2022-06-30 DIAGNOSIS — E1165 Type 2 diabetes mellitus with hyperglycemia: Secondary | ICD-10-CM

## 2022-07-15 ENCOUNTER — Other Ambulatory Visit (HOSPITAL_COMMUNITY): Payer: Self-pay

## 2022-07-31 ENCOUNTER — Other Ambulatory Visit: Payer: BC Managed Care – PPO

## 2022-08-03 ENCOUNTER — Other Ambulatory Visit (INDEPENDENT_AMBULATORY_CARE_PROVIDER_SITE_OTHER): Payer: BC Managed Care – PPO

## 2022-08-03 DIAGNOSIS — Z794 Long term (current) use of insulin: Secondary | ICD-10-CM | POA: Diagnosis not present

## 2022-08-03 DIAGNOSIS — E7800 Pure hypercholesterolemia, unspecified: Secondary | ICD-10-CM | POA: Diagnosis not present

## 2022-08-03 DIAGNOSIS — E1165 Type 2 diabetes mellitus with hyperglycemia: Secondary | ICD-10-CM | POA: Diagnosis not present

## 2022-08-03 LAB — LIPID PANEL
Cholesterol: 147 mg/dL (ref 0–200)
HDL: 85.2 mg/dL (ref >39.00)
LDL Cholesterol: 56 mg/dL (ref 0–99)
NonHDL: 61.46
Total CHOL/HDL Ratio: 2
Triglycerides: 28 mg/dL (ref 0.0–149.0)
VLDL: 5.6 mg/dL (ref 0.0–40.0)

## 2022-08-03 LAB — COMPREHENSIVE METABOLIC PANEL
ALT: 24 U/L (ref 0–35)
AST: 30 U/L (ref 0–37)
Albumin: 4.2 g/dL (ref 3.5–5.2)
Alkaline Phosphatase: 102 U/L (ref 39–117)
BUN: 11 mg/dL (ref 6–23)
CO2: 26 mEq/L (ref 19–32)
Calcium: 9.4 mg/dL (ref 8.4–10.5)
Chloride: 101 mEq/L (ref 96–112)
Creatinine, Ser: 0.91 mg/dL (ref 0.40–1.20)
GFR: 74.8 mL/min (ref >60.00)
Glucose, Bld: 93 mg/dL (ref 70–99)
Potassium: 4 mEq/L (ref 3.5–5.1)
Sodium: 137 mEq/L (ref 135–145)
Total Bilirubin: 1 mg/dL (ref 0.2–1.2)
Total Protein: 7.1 g/dL (ref 6.0–8.3)

## 2022-08-03 LAB — HEMOGLOBIN A1C: Hgb A1c MFr Bld: 7.5 % — ABNORMAL HIGH (ref 4.6–6.5)

## 2022-08-06 ENCOUNTER — Ambulatory Visit: Payer: BC Managed Care – PPO | Admitting: Endocrinology

## 2022-08-06 ENCOUNTER — Encounter: Payer: Self-pay | Admitting: Endocrinology

## 2022-08-06 VITALS — BP 110/70 | HR 72 | Ht 65.0 in | Wt 157.0 lb

## 2022-08-06 DIAGNOSIS — E7800 Pure hypercholesterolemia, unspecified: Secondary | ICD-10-CM | POA: Diagnosis not present

## 2022-08-06 DIAGNOSIS — E139 Other specified diabetes mellitus without complications: Secondary | ICD-10-CM | POA: Diagnosis not present

## 2022-08-06 NOTE — Progress Notes (Unsigned)
[name], female   DOB: [birth year], 46 y.o.   MRN: [id number]   Reason for Appointment: follow-up  History of Present Illness   Diagnosis: date of diagnosis: 2009, initially asymptomatic, has type 2 diabetes.   RECENT history:   Current treatment regimen:  OMNIPOD 5 insulin pump  BASAL RATES: Midnight = 0.3, 5 AM = 0.45, 9 AM = 0.3 and 7 PM = 0. 45   Bolus settings: I/C ratio 10  ISF: 60, Target: 110, with correction over 110.   Non-insulin hypoglycemic drugs: Farxiga 10 mg daily in evening  A1c  which was at her best level of 6.6 now back up to 7.4  Blood sugar patterns, management: She has worsening of her diabetes control which she thinks is from going off her diet but eating carbohydrates at will and also sweets  She also has not been able to maintain the automated mode consistently and has been in the manual mode 21% of the time  Not clear why she is exiting automated mode but may be partly from periods of hyperglycemia and she does not try to get back into the automated mode right away  Since she is not always getting adequate coverage for her meals and may bolus a second time causing subsequent hypoglycemia occasionally also  She was supposed to be changing her carbohydrate coverage to 1: 9 at least in the evening but she is still doing 1: 10  Blood sugars are generally very stable if in the automated mode overnight  She has done consistent exercise although her weight is about the same Previously time in range was 85% and now 76  Side effects from medications: Diarrhea from metformin and abdominal discomfort   Interpretation of the CGM from her Dexcom and pump download as follows  HIGHEST blood sugars are late evening after 9 PM through 2 AM averaging 181 around 11 PM  Overall lowest blood sugars are overnight are between 4-5 AM averaging 141 Moderate variability is present with some marked hyperglycemia either overnight, late  afternoon and early evening hours without consistency Review of her day-to-day patterns show intermittent periods of HYPERGLYCEMIA indicating mealtime spikes mid afternoon, early or late evening and occasionally late at night OVERNIGHT blood sugars are variable in the early part of the night but generally improved between about 4 AM-9 AM.  Hypoglycemia noted twice overnight, at least once preceded by  late evening bolus activity HYPOGLYCEMIA is not usually present during the day except rarely around 1 PM and 7 PM Premeal blood sugars are generally averaging 130-150  CGM use % of time   2-week average/GV   Time in range        %  % Time Above 180   % Time above 250   % Time Below 70      PRE-MEAL Fasting Lunch Dinner Bedtime Overall  Glucose range:       Averages:        POST-MEAL PC Breakfast PC Lunch PC Dinner  Glucose range:     Averages:        CGM use % of time   2-week average/GV 150+/-55  Time in range       76% was 85  % Time Above 180 14  % Time above 250 8  % Time Below 70 2        Meals: 3 meals per day, vegetarian, tries to get protein like nuts, [place] yogurt, soy, fish  Dinner usually  at 7-8 pm.Cheese sandwiich, or vegetable soup with cheese, may have 1-2 starches per meal Sometimes will have  peanut butter sandwich for lunch  Dietician visit: Most recent: 02/2020  CDE visit: 10/15    Wt Readings from Last 3 Encounters:  08/06/22 157 lb (71.2 kg)  05/14/22 157 lb (71.2 kg)  01/15/22 160 lb (72.6 kg)    LABS:  Lab Results  Component Value Date   HGBA1C 7.5 (H) 08/03/2022   HGBA1C 7.4 (H) 05/11/2022   HGBA1C 6.6 (H) 01/12/2022   Lab Results  Component Value Date   MICROALBUR <0.7 05/11/2022   LDLCALC 56 08/03/2022   CREATININE 0.91 08/03/2022       Lab on 08/03/2022  Component Date Value Ref Range Status   Cholesterol 08/03/2022 147  0 - 200 mg/dL Final   ATP III Classification       Desirable:  < 200 mg/dL               Borderline High:   200 - 239 mg/dL          High:  > = 240 mg/dL   Triglycerides 08/03/2022 28.0  0.0 - 149.0 mg/dL Final   Normal:  <150 mg/dLBorderline High:  150 - 199 mg/dL   HDL 08/03/2022 85.20  >39.00 mg/dL Final   VLDL 08/03/2022 5.6  0.0 - 40.0 mg/dL Final   LDL Cholesterol 08/03/2022 56  0 - 99 mg/dL Final   Total CHOL/HDL Ratio 08/03/2022 2   Final                  Men          Women1/2 Average Risk     3.4          3.3Average Risk          5.0          4.42X Average Risk          9.6          7.13X Average Risk          15.0          11.0                       NonHDL 08/03/2022 61.46   Final   NOTE:  Non-HDL goal should be 30 mg/dL higher than patient's LDL goal (i.e. LDL goal of < 70 mg/dL, would have non-HDL goal of < 100 mg/dL)   Sodium 08/03/2022 137  135 - 145 mEq/L Final   Potassium 08/03/2022 4.0  3.5 - 5.1 mEq/L Final   Chloride 08/03/2022 101  96 - 112 mEq/L Final   CO2 08/03/2022 26  19 - 32 mEq/L Final   Glucose, Bld 08/03/2022 93  70 - 99 mg/dL Final   BUN 08/03/2022 11  6 - 23 mg/dL Final   Creatinine, Ser 08/03/2022 0.91  0.40 - 1.20 mg/dL Final   Total Bilirubin 08/03/2022 1.0  0.2 - 1.2 mg/dL Final   Alkaline Phosphatase 08/03/2022 102  39 - 117 U/L Final   AST 08/03/2022 30  0 - 37 U/L Final   ALT 08/03/2022 24  0 - 35 U/L Final   Total Protein 08/03/2022 7.1  6.0 - 8.3 g/dL Final   Albumin 08/03/2022 4.2  3.5 - 5.2 g/dL Final   GFR 08/03/2022 74.80  >60.00 mL/min Final   Calculated using the CKD-EPI Creatinine Equation (2021)   Calcium 08/03/2022 9.4  8.4 -  10.5 mg/dL Final   Hgb A1c MFr Bld 08/03/2022 7.5 (H)  4.6 - 6.5 % Final   Glycemic Control Guidelines for People with Diabetes:Non Diabetic:  <6%Goal of Therapy: <7%Additional Action Suggested:  >8%     Allergies as of 08/06/2022       Reactions   Other Anaphylaxis   Peaches , tomatoes, strawberry   Strawberry Extract Hives   Facial hives and mouth hives   Tomato Hives   Facial hives and mouth hives         Medication List        Accurate as of August 06, 2022 10:39 AM. If you have any questions, ask your nurse or doctor.          atorvastatin 10 MG tablet Commonly known as: LIPITOR Take 10 mg by mouth daily.   buPROPion 150 MG 24 hr tablet Commonly known as: WELLBUTRIN XL Take 150 mg by mouth every morning.   cyanocobalamin 1000 MCG tablet Commonly known as: VITAMIN B12 Take 1,000 mcg by mouth daily.   Dexcom G6 Sensor Misc USE TO MONITOR BLOOD SUGAR, CHANGE AFTER 10 DAYS   Dexcom G6 Transmitter Misc AS DIRECTED   DULoxetine 60 MG capsule Commonly known as: CYMBALTA Take 60 mg by mouth daily.   Farxiga 10 MG Tabs tablet Generic drug: dapagliflozin propanediol TAKE 1 TABLET BY MOUTH DAILY BEFORE BREAKFAST.   Fiasp 100 UNIT/ML Soln Generic drug: Insulin Aspart (w/Niacinamide) INJECT 30 UNITS INTO THE SKIN DAILY. USE MAX OF 30 UNITS DAILY VIA INSULIN PUMP   Lyumjev 100 UNIT/ML Soln Generic drug: Insulin Lispro-aabc INJECT 30 UNITS INTO THE SKIN DAILY. USE MAX OF 30 UNITS DAILY VIA INSULIN PUMP   multivitamin with minerals Tabs tablet Take 1 tablet by mouth daily.   Omnipod DASH Pods (Gen 4) Misc CHANGE 1 POD EVERY 72 HOURS AS DIRECTED   Omnipod 5 G6 Pod (Gen 5) Misc 1 DEVICE BY DOES NOT APPLY ROUTE EVERY 3 (THREE) DAYS.   OneTouch Delica Plus [id number] Misc 1 each by Does not apply route See admin instructions. Use Onetouch Delica Plus lancets to check blood sugar before and after each meal.   OneTouch Verio Flex System w/Device Kit CHECK BLOOD SUGAR BEFORE AND AFTER EACH MEAL   OneTouch Verio test strip Generic drug: glucose blood USE ONETOUCH VERIO FLEX AS INSTRUCTED TO CHECK BLOOD SUGAR BEFORE AND AFTER EACH MEAL.        Allergies:  Allergies  Allergen Reactions   Other Anaphylaxis    Peaches , tomatoes, strawberry   Strawberry Extract Hives    Facial hives and mouth hives   Tomato Hives    Facial hives and mouth hives    Past Medical  History:  Diagnosis Date   Back pain    Diabetes mellitus    Fibroids    PCOS (polycystic ovarian syndrome)     Past Surgical History:  Procedure Laterality Date   BREAST EXCISIONAL BIOPSY Right    BREAST SURGERY     benign   IR ANGIOGRAM PELVIS SELECTIVE OR SUPRASELECTIVE  11/17/2018   IR ANGIOGRAM PELVIS SELECTIVE OR SUPRASELECTIVE  11/17/2018   IR ANGIOGRAM SELECTIVE EACH ADDITIONAL VESSEL  11/17/2018   IR ANGIOGRAM SELECTIVE EACH ADDITIONAL VESSEL  11/17/2018   IR EMBO TUMOR ORGAN ISCHEMIA INFARCT INC GUIDE ROADMAPPING  11/17/2018   IR RADIOLOGIST EVAL & MGMT  09/14/2018   IR RADIOLOGIST EVAL & MGMT  12/13/2018   IR RADIOLOGIST EVAL & MGMT  06/28/2019  IR US GUIDE VASC ACCESS RIGHT  11/17/2018   UTERINE FIBROID SURGERY      Family History  Problem Relation Age of Onset   Hyperlipidemia Other    Hypertension Other    Stroke Other    Cancer Other    Diabetes Maternal Uncle    Heart disease Paternal Uncle    Diabetes Maternal Grandmother    Heart disease Maternal Grandmother    Breast cancer Maternal Grandmother        diagnosed in her 46's   Diabetes Paternal Grandmother     Social History:  reports that she has never smoked. She has never used smokeless tobacco. She reports that she does not drink alcohol and does not use drugs.  Review of Systems -  HYPERCHOLESTEROLEMIA:   Baseline LDL was only 100 and PCP has prescribed her on Lipitor 10 mg Last lipid profile shows excellent results  Lab Results  Component Value Date   CHOL 147 08/03/2022   HDL 85.20 08/03/2022   LDLCALC 56 08/03/2022   TRIG 28.0 08/03/2022   CHOLHDL 2 08/03/2022   No history of hypertension or microalbuminuria      Examination:   BP 110/70   Pulse 72   Ht $R'5\' 5"'WV$  (1.651 m)   Wt 157 lb (71.2 kg)   LMP 11/10/2018 (Approximate)   SpO2 98%   BMI 26.13 kg/m   Body mass index is 26.13 kg/m.    Assesment:   DIABETES type 2:  See history of present illness for detailed  discussion of  current management, blood sugar patterns from CGM analysis and problems identified  Her A1c is higher at 7.5  She is on the Omni pod 5 insulin pump using Fiasp and [place]  Currently is still over 70% within target range but 76% compared to 85 previously Part of her hyperglycemia is related to going off her diet with more carbohydrates, sweets and less consistent balanced meals and exercise   Recommendations today: Carbohydrate coverage 1: 9 at dinnertime but if she still having high readings go to 1: 8 She will need to cut back on high glycemic index foods with sweets as well as foods like pizza Try to bolus consistently before starting to eat, 5 to 10 minutes before  Avoid stacking boluses if the blood sugars are high and if the blood sugar goes up from late boluses only do a correction bolus Follow-up with the nutritionist/educator to help with mealtime management better and reassess her new carbohydrate ratio that is needed She will make sure she boluses consistently for all carbohydrate intake with meals and snacks in the evening Overall needs reduction in carbohydrate intake  No change in correction factor or target glucose Try to keep in the automated mode as much as possible Restart automated mode right away if sensor has been restarted after a break  LIPIDS: Follow-up on the next visit  Follow-up in 3 months   There are no Patient Instructions on file for this visit.      Elayne Snare 08/06/2022, 10:39 AM   Note: This office note was prepared with Dragon voice recognition system technology. Any transcriptional errors that result from this process are unintentional.

## 2022-08-06 NOTE — Patient Instructions (Signed)
Turn on the activity mode with exercise duration entered before starting to do any significant aerobic activity

## 2022-08-13 ENCOUNTER — Encounter: Payer: Self-pay | Admitting: Endocrinology

## 2022-08-16 ENCOUNTER — Other Ambulatory Visit: Payer: Self-pay | Admitting: Endocrinology

## 2022-08-16 DIAGNOSIS — E1165 Type 2 diabetes mellitus with hyperglycemia: Secondary | ICD-10-CM

## 2022-08-17 ENCOUNTER — Encounter: Payer: Self-pay | Admitting: Dietician

## 2022-08-17 ENCOUNTER — Encounter: Payer: BC Managed Care – PPO | Attending: Physician Assistant | Admitting: Dietician

## 2022-08-17 DIAGNOSIS — E139 Other specified diabetes mellitus without complications: Secondary | ICD-10-CM | POA: Insufficient documentation

## 2022-08-17 NOTE — Patient Instructions (Addendum)
Resources: [email or web address] Mastering Diabetes book     Exercise after a meal  Sign up for [employer]?   What can you do for self care? Emotional eating - what can you do instead? OR Philosophy change Discuss support at home with family - "What helps you reach your goal?"

## 2022-08-17 NOTE — Progress Notes (Signed)
Diabetes Self-Management Education  Visit Type: First/Initial  Appt. Start Time: 0850 Appt. End Time: 0930  08/17/2022  [name], identified by name and date of birth, is a 46 y.o. female with a diagnosis of Diabetes:  (Type 1.5).   ASSESSMENT Patient is here today alone.  She was last seen in this office 04/12/2020.  Not drinking enough water per patient but just moved to a new house with well water and is drinking more. Her A1C increased she states because she went from being vegan to easing foods into her diet such as pumpkin bread, feta cheese, cheesecake.   There family celebrates with food. She is a stress eater.  Eating her feelings.  Has 88 lb dog who needs to walk daily. Increased stress.  Husband with PTSD and HTN.  She is concerned about her family and what they are eating.  She and her sons have ADHD.  She continues to work and has changed jobs.  Mother-in-law just placed in assisted living.  Difficulty with sons. Will go by [facility] to get a sweetened coffee at times to help cope with stress. Signed up for [facility] (family membership) but is currently not going.   History includes:  Type 1.5 diabetes Medication includes Farxiga,  Lyumjev, Vitamin B-12, MVI Labs include:  A1C 7.4% 08/03/2022  and 7.4% 05/11/2022 Pump:  Omnipod 5 CGM:  Dexcom G6   Labs 04/12/20:  A1C 7.5% 08/03/2022 increased from 6.2% (decreased from 6.7%), cholesterol 148, HDL 80, LDL 57, Triglycerides 52 Patient lives with her husband who works 2 jobs (education and [employer]). 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 She also has 4 boys who are adopted ages 47, 55, 72, and 4. She does the shopping and cooking. Hives with anything red, peanut butter, as well as eggplant (tomatoes, raspberries, red apples, strawberries).  Eats them occasionally.  Weight 154 lb (69.9 kg), last menstrual period 11/10/2018. Body mass index is 25.63 kg/m.    Diabetes Self-Management Education - 08/17/22 1328       Visit Information   Visit Type First/Initial      Initial Visit   Diabetes Type --   Type 1.5   Date Diagnosed 2010    Are you currently following a meal plan? Yes    What type of meal plan do you follow? vegetarian    Are you taking your medications as prescribed? Yes      Health Coping   How would you rate your overall health? Fair      Psychosocial Assessment   Patient Belief/Attitude about Diabetes Defeat/Burnout    What is the hardest part about your diabetes right now, causing you the most concern, or is the most worrisome to you about your diabetes?   Making healty food and beverage choices    Self-care barriers Other (comment)   time, stress   Other persons present Patient    Patient Concerns Nutrition/Meal planning;Glycemic Control    Special Needs None    Preferred Learning Style No preference indicated    Learning Readiness Ready    How often do you need to have someone help you when you read instructions, pamphlets, or other written materials from your doctor or pharmacy? 1 - Never    What is the last grade level you completed in school? grad school      Pre-Education Assessment   Patient understands the diabetes disease and treatment process. Demonstrates understanding / competency  Patient understands incorporating nutritional management into lifestyle. Needs Review    Patient undertands incorporating physical activity into lifestyle. Needs Review    Patient understands using medications safely. Demonstrates understanding / competency    Patient understands monitoring blood glucose, interpreting and using results Demonstrates understanding / competency    Patient understands prevention, detection, and treatment of acute complications. Demonstrates understanding / competency    Patient understands prevention, detection, and treatment of chronic complications. Demonstrates understanding / competency    Patient  understands how to develop strategies to address psychosocial issues. Demonstrates understanding / competency    Patient understands how to develop strategies to promote health/change behavior. Needs Review      Complications   Last HgB A1C per patient/outside source 7.5 %   08/03/2022   How often do you check your blood sugar? > 4 times/day    Have you had a dilated eye exam in the past 12 months? No    Have you had a dental exam in the past 12 months? No    Are you checking your feet? Yes    How many days per week are you checking your feet? 7      Patient Education   Previous Diabetes Education Yes (please comment)   2021   Healthy Eating Meal options for control of blood glucose level and chronic complications.;Other (comment)   plant based eating   Being Active Role of exercise on diabetes management, blood pressure control and cardiac health.    Medications Reviewed patients medication for diabetes, action, purpose, timing of dose and side effects.    Diabetes Stress and Support Identified and addressed patients feelings and concerns about diabetes;Worked with patient to identify barriers to care and solutions;Role of stress on diabetes      Individualized Goals (developed by patient)   Nutrition Other (comment)   plant based   Physical Activity Exercise 3-5 times per week;60 minutes per day    Medications take my medication as prescribed    Monitoring  Consistenly use CGM    Problem Solving Addressing barriers to behavior change    Reducing Risk treat hypoglycemia with 15 grams of carbs if blood glucose less than '70mg'$ /dL;examine blood glucose patterns      Patient Self-Evaluation of Goals - Patient rates self as meeting previously set goals (% of time)   Nutrition 50 - 75 % (half of the time)    Physical Activity < 25% (hardly ever/never)    Medications >75% (most of the time)    Monitoring >75% (most of the time)    Problem Solving and behavior change strategies  50 - 75 % (half  of the time)    Reducing Risk (treating acute and chronic complications) 50 - 75 % (half of the time)    Health Coping 50 - 75 % (half of the time)      Post-Education Assessment   Patient understands the diabetes disease and treatment process. Demonstrates understanding / competency    Patient understands incorporating nutritional management into lifestyle. Demonstrates understanding / competency    Patient undertands incorporating physical activity into lifestyle. Demonstrates understanding / competency    Patient understands using medications safely. Demonstrates understanding / competency    Patient understands monitoring blood glucose, interpreting and using results Demonstrates understanding / competency    Patient understands prevention, detection, and treatment of acute complications. Demonstrates understanding / competency    Patient understands prevention, detection, and treatment of chronic complications. Demonstrates understanding / competency  Patient understands how to develop strategies to address psychosocial issues. Comprehends key points    Patient understands how to develop strategies to promote health/change behavior. Comprehends key points      Outcomes   Expected Outcomes Demonstrated interest in learning. Expect positive outcomes    Future DMSE 3-4 months    Program Status Not Completed             Individualized Plan for Diabetes Self-Management Training:   Learning Objective:  Patient will have a greater understanding of diabetes self-management. Patient education plan is to attend individual and/or group sessions per assessed needs and concerns.   Plan:   Patient Instructions  Resources: [email or web address] Mastering Diabetes book     Exercise after a meal  Sign up for [facility]?   What can you do for self care? Emotional eating - what can you do instead? OR Philosophy change Discuss support at home with family - "What helps you reach your  goal?"    Expected Outcomes:  Demonstrated interest in learning. Expect positive outcomes  Education material provided:   [place]  If problems or questions, patient to contact team via:  Phone  Future DSME appointment: 3-4 months

## 2022-10-25 ENCOUNTER — Other Ambulatory Visit: Payer: Self-pay | Admitting: Endocrinology

## 2022-10-25 DIAGNOSIS — E1165 Type 2 diabetes mellitus with hyperglycemia: Secondary | ICD-10-CM

## 2022-10-29 DIAGNOSIS — S91301A Unspecified open wound, right foot, initial encounter: Secondary | ICD-10-CM | POA: Diagnosis not present

## 2022-10-29 DIAGNOSIS — E1169 Type 2 diabetes mellitus with other specified complication: Secondary | ICD-10-CM | POA: Diagnosis not present

## 2022-10-29 DIAGNOSIS — F909 Attention-deficit hyperactivity disorder, unspecified type: Secondary | ICD-10-CM | POA: Diagnosis not present

## 2022-11-09 ENCOUNTER — Other Ambulatory Visit: Payer: Self-pay | Admitting: Endocrinology

## 2022-11-13 ENCOUNTER — Other Ambulatory Visit: Payer: Self-pay | Admitting: Physician Assistant

## 2022-11-13 DIAGNOSIS — Z1231 Encounter for screening mammogram for malignant neoplasm of breast: Secondary | ICD-10-CM

## 2022-11-20 DIAGNOSIS — E7800 Pure hypercholesterolemia, unspecified: Secondary | ICD-10-CM | POA: Diagnosis not present

## 2022-11-20 DIAGNOSIS — Z0000 Encounter for general adult medical examination without abnormal findings: Secondary | ICD-10-CM | POA: Diagnosis not present

## 2022-11-20 DIAGNOSIS — R5383 Other fatigue: Secondary | ICD-10-CM | POA: Diagnosis not present

## 2022-11-20 DIAGNOSIS — E1169 Type 2 diabetes mellitus with other specified complication: Secondary | ICD-10-CM | POA: Diagnosis not present

## 2022-11-20 DIAGNOSIS — F419 Anxiety disorder, unspecified: Secondary | ICD-10-CM | POA: Diagnosis not present

## 2022-11-25 ENCOUNTER — Other Ambulatory Visit: Payer: Self-pay | Admitting: Physician Assistant

## 2022-11-25 DIAGNOSIS — Z1231 Encounter for screening mammogram for malignant neoplasm of breast: Secondary | ICD-10-CM

## 2022-11-30 ENCOUNTER — Encounter: Payer: BC Managed Care – PPO | Attending: Endocrinology | Admitting: Dietician

## 2022-11-30 ENCOUNTER — Encounter: Payer: Self-pay | Admitting: Dietician

## 2022-11-30 VITALS — Wt 155.0 lb

## 2022-11-30 DIAGNOSIS — E139 Other specified diabetes mellitus without complications: Secondary | ICD-10-CM | POA: Diagnosis not present

## 2022-11-30 NOTE — Patient Instructions (Addendum)
Adequate water. Simple meal planning  Nutrition balance and avoiding skipping meals to provide adequate nutrition. Calorie King app - aim to consistently accurately count carbs Consider starting to walk the dog.  [facility] class?  Consider entering your sensor reading into your pump 2-3 hours after a meal as this improves control in some (even when using auto mode).  Resources:  websites and [employer] http://cook-fox.com/

## 2022-11-30 NOTE — Progress Notes (Signed)
Diabetes Self-Management Education  Visit Type: Follow-up  Appt. Start Time: 0810 Appt. End Time: 0900  11/30/2022  [name], identified by name and date of birth, is a 47 y.o. female with a diagnosis of Diabetes:  .   ASSESSMENT Patient is here today with her 75 yo son.  She was last seen by myself 08/2022  155 lbs stable States that she changed her diet to mostly vegan.  Her sister is doing a program in [place] regarding a vegan diet and they are sharing exercise. They just got a new dog and also has a [employer]. Is not going to the gym or exercising but states that she would exercise at her home if time. Sister challenging her to drink more water. She states that she would like to learn more about pairing foods  Increased her antidepressant due to season. Needs to focus on balancing food throughout the day so that she doesn't over eat dinner. She is not eating breakfast. She has been told to stop caffeine. States that she is still very tired but maybe a little improved.  Skips meals and nourishes mostly at night resulting in a large meal and inadequate nutrition overall. Overestimating carbs and not treating lows soon enough have resulted in a low to 40 occasionally. Head space app occasionally for meditation or classical music or other music  Son got a Nurse, adult for Christmas,.  Whole family helps cook  History includes:  Type 1.5 diabetes Medication includes Farxiga,  Lyumjev, Vitamin B-12, MVI Labs include:  A1C 7.5% 08/03/2022  and 7.4% 05/11/2022 Pump:  Omnipod 5 CGM:  Dexcom G6  CGM Results from download:   % Time CGM active:   64 %   (Goal >70%)  Average glucose:   147 mg/dL for 14 days  Time in range (70-180 mg/dL):   78 %   (Goal >70%)  Time High (181-250 mg/dL):   14 %   (Goal < 25%)  Time Very High (>250 mg/dL):    6 %   (Goal < 5%)  Time Low (54-69 mg/dL):   1 %   (Goal <4%)  Time Very Low (<54 mg/dL):   <1 %   (Goal <1%)    Patient  lives with her husband who works 2 jobs (education and [employer]). 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 She also has 4 boys who are adopted ages 70, 70, 81, and 83. She does the shopping and cooking. Hives with anything red, peanut butter, as well as eggplant (tomatoes, raspberries, red apples, strawberries).  Eats them occasionally.  Weight 155 lb (70.3 kg), last menstrual period 11/10/2018. Body mass index is 25.79 kg/m.   Diabetes Self-Management Education - 11/30/22 1053       Visit Information   Visit Type Follow-up      Psychosocial Assessment   Self-care barriers None    Self-management support Doctor's office    Other persons present Patient    Special Needs None    Preferred Learning Style No preference indicated    Learning Readiness Ready      Pre-Education Assessment   Patient understands the diabetes disease and treatment process. Demonstrates understanding / competency    Patient understands incorporating nutritional management into lifestyle. Needs Review    Patient undertands incorporating physical activity into lifestyle. Comprehends key points    Patient understands using medications safely. Demonstrates understanding / competency    Patient understands monitoring blood glucose,  interpreting and using results Demonstrates understanding / competency    Patient understands prevention, detection, and treatment of acute complications. Demonstrates understanding / competency    Patient understands prevention, detection, and treatment of chronic complications. Demonstrates understanding / competency    Patient understands how to develop strategies to address psychosocial issues. Demonstrates understanding / competency    Patient understands how to develop strategies to promote health/change behavior. Needs Review      Complications   Last HgB A1C per patient/outside source 7.5 %   08/03/2022   Fasting Blood glucose range  (mg/dL) 70-129;130-179   90-140   Number of hypoglycemic episodes per month 4      Dietary Intake   Breakfast skips    Lunch apple, peanut butter    Dinner 2 bean burrito's    Snack (evening) none    Beverage(s) water, hot tea      Activity / Exercise   Activity / Exercise Type Light (walking / raking leaves);ADL's      Patient Education   Previous Diabetes Education Yes (please comment)   08/2022   Healthy Eating Meal options for control of blood glucose level and chronic complications.    Being Active Role of exercise on diabetes management, blood pressure control and cardiac health.    Medications Reviewed patients medication for diabetes, action, purpose, timing of dose and side effects.    Monitoring Taught/evaluated CGM (comment)    Diabetes Stress and Support Identified and addressed patients feelings and concerns about diabetes    Lifestyle and Health Coping Lifestyle issues that need to be addressed for better diabetes care      Individualized Goals (developed by patient)   Nutrition Carb counting;General guidelines for healthy choices and portions discussed    Physical Activity Exercise 3-5 times per week;30 minutes per day    Medications take my medication as prescribed    Monitoring  Consistenly use CGM    Problem Solving Eating Pattern   balanced nutrition   Reducing Risk examine blood glucose patterns;Other (comment)      Patient Self-Evaluation of Goals - Patient rates self as meeting previously set goals (% of time)   Nutrition 50 - 75 % (half of the time)    Physical Activity < 25% (hardly ever/never)    Medications >75% (most of the time)    Monitoring >75% (most of the time)    Problem Solving and behavior change strategies  50 - 75 % (half of the time)    Reducing Risk (treating acute and chronic complications) 50 - 75 % (half of the time)    Health Coping 50 - 75 % (half of the time)      Post-Education Assessment   Patient understands the diabetes disease  and treatment process. Demonstrates understanding / competency    Patient understands incorporating nutritional management into lifestyle. Needs Review    Patient undertands incorporating physical activity into lifestyle. Comprehends key points    Patient understands using medications safely. Demonstrates understanding / competency    Patient understands monitoring blood glucose, interpreting and using results Demonstrates understanding / competency    Patient understands prevention, detection, and treatment of acute complications. Demonstrates understanding / competency    Patient understands prevention, detection, and treatment of chronic complications. Demonstrates understanding / competency    Patient understands how to develop strategies to address psychosocial issues. Comprehends key points    Patient understands how to develop strategies to promote health/change behavior. Comprehends key points  Outcomes   Expected Outcomes Demonstrated interest in learning. Expect positive outcomes    Future DMSE 3-4 months    Program Status Not Completed      Subsequent Visit   Since your last visit have you continued or begun to take your medications as prescribed? No    Since your last visit have you experienced any weight changes? No change             Individualized Plan for Diabetes Self-Management Training:   Learning Objective:  Patient will have a greater understanding of diabetes self-management. Patient education plan is to attend individual and/or group sessions per assessed needs and concerns.   Plan:   Patient Instructions  Adequate water. Simple meal planning  Nutrition balance and avoiding skipping meals to provide adequate nutrition. Calorie King app - aim to consistently accurately count carbs Consider starting to walk the dog.  [facility] class?  Consider entering your sensor reading into your pump 2-3 hours after a meal as this improves control in some (even when  using auto mode).  Resources:  websites and [employer] http://cook-fox.com/  Expected Outcomes:  Demonstrated interest in learning. Expect positive outcomes  Education material provided:   If problems or questions, patient to contact team via:  Phone  Future DSME appointment: 3-4 months

## 2022-12-08 ENCOUNTER — Other Ambulatory Visit (INDEPENDENT_AMBULATORY_CARE_PROVIDER_SITE_OTHER): Payer: BC Managed Care – PPO

## 2022-12-08 DIAGNOSIS — E139 Other specified diabetes mellitus without complications: Secondary | ICD-10-CM

## 2022-12-08 LAB — BASIC METABOLIC PANEL
BUN: 11 mg/dL (ref 6–23)
CO2: 27 mEq/L (ref 19–32)
Calcium: 10.1 mg/dL (ref 8.4–10.5)
Chloride: 99 mEq/L (ref 96–112)
Creatinine, Ser: 0.93 mg/dL (ref 0.40–1.20)
GFR: 72.69 mL/min (ref >60.00)
Glucose, Bld: 109 mg/dL — ABNORMAL HIGH (ref 70–99)
Potassium: 4.5 mEq/L (ref 3.5–5.1)
Sodium: 138 mEq/L (ref 135–145)

## 2022-12-08 LAB — HEMOGLOBIN A1C: Hgb A1c MFr Bld: 7.4 % — ABNORMAL HIGH (ref 4.6–6.5)

## 2022-12-10 ENCOUNTER — Encounter: Payer: Self-pay | Admitting: Endocrinology

## 2022-12-10 ENCOUNTER — Telehealth (INDEPENDENT_AMBULATORY_CARE_PROVIDER_SITE_OTHER): Payer: BC Managed Care – PPO | Admitting: Endocrinology

## 2022-12-10 DIAGNOSIS — E139 Other specified diabetes mellitus without complications: Secondary | ICD-10-CM | POA: Diagnosis not present

## 2022-12-10 NOTE — Progress Notes (Signed)
[name], female   DOB: [birth year], 47 y.o.   MRN: [id number]  I connected with the above-named patient by video enabled telemedicine application and verified that I am speaking with the correct person. The patient was explained the limitations of evaluation and management by telemedicine and the availability of in person appointments.  Patient also understood that there may be a patient responsible charge related to this service  Location of the patient: Patient's home  Location of the provider: Physician office Only the patient and myself were participating in the encounter The patient understood the above statements and agreed to proceed.  Reason for Appointment: follow-up  History of Present Illness   Diagnosis: date of diagnosis: 2009, initially asymptomatic   RECENT history:   Current treatment regimen:  OMNIPOD 5 insulin pump  BASAL RATES: Midnight = 0.3, 5 AM = 0.45, 9 AM = 0.3 and 7 PM = 0. 45   Bolus settings: I/C ratio 12 ISF: 60, Target: 110, with correction over 140.  Insulin: Currently using Lyumjev:  Non-insulin hypoglycemic drugs: Farxiga 10 mg daily in evening  A1c is still about the same at 7.4, previously was also high at 7.5   Blood sugar patterns, management:  She has relatively better control in the last week with GMI 6.7 She says that she has worked with the dietitian and trying to increase protein in her diet with tofu and nuts as well as some feta cheese but is still trying to be vegan Although she still has some tendency to high postprandial readings this may not be occurring right after starting to eat and somewhat later but not consistently Also may still have some tendency to low sugars 4-5 hours after the bolus especially after 5 PM Only rarely will have low normal or low sugars right after her bolus On insulin she has reduced her carb ratio and now taking 1: 12 Because of depression she has not started any  exercise lately Time in range is improved She is trying to walk her dog periodically Weight is about the same recently She has tried to use the automated mode except when she is having some technical difficulties with the sensor Also tries to calibrate the Dexcom periodically  Side effects from medications: Diarrhea from metformin and abdominal discomfort   Interpretation of the CGM from her Dexcom and pump download for 2 weeks through 1/18 as follows   Summary of patterns: Overall blood sugars are well controlled and stable overnight after about 3 AM through 12 noon Subsequently blood sugars are moderately variable with periods of hyperglycemia and also transient hypoglycemia Overnight blood sugars by morning are averaging in the 120s and no hypoglycemia With her 2 meals daily her blood sugars show rise over 180 about 3 or 4 occasions in the last 2 weeks after lunch but overall average blood sugar is not rising significantly  Blood sugars after dinner are also variable with sporadic significant increase over the target especially in the last week Also has periodic hypoglycemia in the afternoons or evenings although less in the last week, this may be 4 to 5 hours after the bolus and transient Overall hypoglycemia is more significant than on her last visit  CGM use % of time   2-week average/ 138+/-41  Time in range       82 %  % Time Above 180 13  % Time above 250 2  % Time Below 70 3     PRE-MEAL  Fasting Lunch Dinner Bedtime Overall  Glucose range:       Averages: 123       POST-MEAL PC Breakfast PC Lunch PC Dinner  Glucose range:     Averages:  153 158   Previously,  CGM use % of time   2-week average/GV 147/35  Time in range 77       %  % Time Above 180 17  % Time above 250 5  % Time Below 70 1   Previously:  CGM use % of time   2-week average/GV 150+/-55  Time in range       76% was 85  % Time Above 180 14  % Time above 250 8  % Time Below 70 2         Meals: 3 meals per day, vegetarian, tries to get protein like nuts, [name] yogurt, soy, fish  Dinner usually at 7-8 pm.Cheese sandwiich, or vegetable soup with cheese, may have 1-2 starches per meal Sometimes will have  peanut butter sandwich for lunch  Dietician visit: Most recent: 11/2022  CDE visit: 10/15    Wt Readings from Last 3 Encounters:  11/30/22 155 lb (70.3 kg)  08/17/22 154 lb (69.9 kg)  08/06/22 157 lb (71.2 kg)    LABS:  Lab Results  Component Value Date   HGBA1C 7.4 (H) 12/08/2022   HGBA1C 7.5 (H) 08/03/2022   HGBA1C 7.4 (H) 05/11/2022   Lab Results  Component Value Date   MICROALBUR <0.7 05/11/2022   LDLCALC 56 08/03/2022   CREATININE 0.93 12/08/2022       Lab on 12/08/2022  Component Date Value Ref Range Status   Sodium 12/08/2022 138  135 - 145 mEq/L Final   Potassium 12/08/2022 4.5  3.5 - 5.1 mEq/L Final   Chloride 12/08/2022 99  96 - 112 mEq/L Final   CO2 12/08/2022 27  19 - 32 mEq/L Final   Glucose, Bld 12/08/2022 109 (H)  70 - 99 mg/dL Final   BUN 12/08/2022 11  6 - 23 mg/dL Final   Creatinine, Ser 12/08/2022 0.93  0.40 - 1.20 mg/dL Final   GFR 12/08/2022 72.69  >60.00 mL/min Final   Calculated using the CKD-EPI Creatinine Equation (2021)   Calcium 12/08/2022 10.1  8.4 - 10.5 mg/dL Final   Hgb A1c MFr Bld 12/08/2022 7.4 (H)  4.6 - 6.5 % Final   Glycemic Control Guidelines for People with Diabetes:Non Diabetic:  <6%Goal of Therapy: <7%Additional Action Suggested:  >8%     Allergies as of 12/10/2022       Reactions   Other Anaphylaxis   Peaches , tomatoes, strawberry   Strawberry Extract Hives   Facial hives and mouth hives   Tomato Hives   Facial hives and mouth hives        Medication List        Accurate as of December 10, 2022 10:10 AM. If you have any questions, ask your nurse or doctor.          atorvastatin 10 MG tablet Commonly known as: LIPITOR Take 10 mg by mouth daily.   buPROPion 150 MG 24 hr tablet Commonly  known as: WELLBUTRIN XL Take 300 mg by mouth every morning.   cyanocobalamin 1000 MCG tablet Commonly known as: VITAMIN B12 Take 1,000 mcg by mouth daily.   Dexcom G6 Sensor Misc USE TO MONITOR BLOOD SUGAR, CHANGE AFTER 10 DAYS   Dexcom G6 Transmitter Misc AS DIRECTED   DULoxetine 60 MG capsule  Commonly known as: CYMBALTA Take 60 mg by mouth daily.   Farxiga 10 MG Tabs tablet Generic drug: dapagliflozin propanediol TAKE 1 TABLET BY MOUTH DAILY BEFORE BREAKFAST.   Lyumjev 100 UNIT/ML Soln Generic drug: Insulin Lispro-aabc INJECT 30 UNITS INTO THE SKIN DAILY. USE MAX OF 30 UNITS DAILY VIA INSULIN PUMP   multivitamin with minerals Tabs tablet Take 1 tablet by mouth daily.   Omnipod 5 G6 Pod (Gen 5) Misc 1 DEVICE BY DOES NOT APPLY ROUTE EVERY 3 (THREE) DAYS.   OneTouch Delica Plus [id number] Misc 1 each by Does not apply route See admin instructions. Use Onetouch Delica Plus lancets to check blood sugar before and after each meal.   OneTouch Verio Flex System w/Device Kit CHECK BLOOD SUGAR BEFORE AND AFTER EACH MEAL   OneTouch Verio test strip Generic drug: glucose blood USE ONETOUCH VERIO FLEX AS INSTRUCTED TO CHECK BLOOD SUGAR BEFORE AND AFTER EACH MEAL.        Allergies:  Allergies  Allergen Reactions   Other Anaphylaxis    Peaches , tomatoes, strawberry   Strawberry Extract Hives    Facial hives and mouth hives   Tomato Hives    Facial hives and mouth hives    Past Medical History:  Diagnosis Date   Back pain    Diabetes mellitus    Fibroids    PCOS (polycystic ovarian syndrome)     Past Surgical History:  Procedure Laterality Date   BREAST EXCISIONAL BIOPSY Right    BREAST SURGERY     benign   IR ANGIOGRAM PELVIS SELECTIVE OR SUPRASELECTIVE  11/17/2018   IR ANGIOGRAM PELVIS SELECTIVE OR SUPRASELECTIVE  11/17/2018   IR ANGIOGRAM SELECTIVE EACH ADDITIONAL VESSEL  11/17/2018   IR ANGIOGRAM SELECTIVE EACH ADDITIONAL VESSEL  11/17/2018   IR EMBO  TUMOR ORGAN ISCHEMIA INFARCT INC GUIDE ROADMAPPING  11/17/2018   IR RADIOLOGIST EVAL & MGMT  09/14/2018   IR RADIOLOGIST EVAL & MGMT  12/13/2018   IR RADIOLOGIST EVAL & MGMT  06/28/2019   IR US GUIDE VASC ACCESS RIGHT  11/17/2018   UTERINE FIBROID SURGERY      Family History  Problem Relation Age of Onset   Hyperlipidemia Other    Hypertension Other    Stroke Other    Cancer Other    Diabetes Maternal Uncle    Heart disease Paternal Uncle    Diabetes Maternal Grandmother    Heart disease Maternal Grandmother    Breast cancer Maternal Grandmother        diagnosed in her 85's   Diabetes Paternal Grandmother     Social History:  reports that she has never smoked. She has never used smokeless tobacco. She reports that she does not drink alcohol and does not use drugs.  Review of Systems -  HYPERCHOLESTEROLEMIA:   Baseline LDL was only 100 and PCP has prescribed her on Lipitor 10 mg Last lipid profile shows excellent results  Lab Results  Component Value Date   CHOL 147 08/03/2022   HDL 85.20 08/03/2022   LDLCALC 56 08/03/2022   TRIG 28.0 08/03/2022   CHOLHDL 2 08/03/2022   No history of hypertension or microalbuminuria      Examination:   LMP 11/10/2018 (Approximate)   There is no height or weight on file to calculate BMI.    Assesment:   DIABETES type 2:  See history of present illness for detailed discussion of  current management, blood sugar patterns from CGM analysis and problems identified  Her  A1c is higher at 7.5  She is on the Omnipod 5 insulin pump using [place]  Again her time in range is excellent at 82% lately Blood sugars are variable and may be higher postprandially in the last week based on her diet Likely because of imbalance of carbohydrates and proteins or not estimating the right boluses she has some variability  Occasionally may have some late hypoglycemia also Currently not exercising Also some of the hypoglycemia may be in  between meals or even without a bolus in the evenings Overnight blood sugars are excellent   Recommendations : Carbohydrate coverage 1 for 12 to be maintained Again continue to try and bolus right before eating or feeling as before Cover all snacks if having any carbohydrate in the afternoon or evening Make sure to take some protein with every meal as directed by dietitian Blood sugar target to be raised to 100 and 3:30 PM to avoid low sugars Avoid overcorrection of low sugars Try to keep the pump in the automated mode as much as possible May consider going back to Humalog for better postmeal patterns Check with behavioral health to see if she can connect with the psychologist  LIPIDS: Followed by PCP  Follow-up in 4 months   There are no Patient Instructions on file for this visit.      Elayne Snare 12/10/2022, 10:10 AM   Note: This office note was prepared with Dragon voice recognition system technology. Any transcriptional errors that result from this process are unintentional.

## 2022-12-11 ENCOUNTER — Encounter: Payer: Self-pay | Admitting: Endocrinology

## 2022-12-18 ENCOUNTER — Other Ambulatory Visit: Payer: Self-pay | Admitting: Endocrinology

## 2022-12-24 ENCOUNTER — Telehealth: Payer: Self-pay | Admitting: Endocrinology

## 2022-12-24 DIAGNOSIS — E1165 Type 2 diabetes mellitus with hyperglycemia: Secondary | ICD-10-CM

## 2022-12-24 MED ORDER — DAPAGLIFLOZIN PROPANEDIOL 10 MG PO TABS: ORAL_TABLET | ORAL | 1 refills | Status: DC

## 2022-12-24 MED ORDER — LYUMJEV 100 UNIT/ML IJ SOLN: INTRAMUSCULAR | 2 refills | Status: DC

## 2022-12-24 MED ORDER — OMNIPOD 5 DEXG7G6 PODS GEN 5 MISC: 1.0000 | 2 refills | Status: DC

## 2022-12-24 NOTE — Telephone Encounter (Signed)
New message   1. Which medications need to be refilled? (please list name of each medication and dose if known)   FARXIGA 10 MG TABS tablet  LYUMJEV 100 UNIT/ML SOLN  Insulin Disposable Pump (OMNIPOD 5 G6 POD, GEN 5,) MISC   2. Which pharmacy/location (including street and city if local pharmacy) is medication to be sent to?[facility]   3. Do they need a 30 day or 90 day supply? 90 day supply with 3 refills.    4.[facility] : [facility].

## 2022-12-24 NOTE — Telephone Encounter (Signed)
done

## 2023-01-08 ENCOUNTER — Ambulatory Visit
Admission: RE | Admit: 2023-01-08 | Discharge: 2023-01-08 | Disposition: A | Discharge status: Home / Self Care | Payer: BC Managed Care – PPO | Source: Ambulatory Visit

## 2023-01-08 DIAGNOSIS — Z1231 Encounter for screening mammogram for malignant neoplasm of breast: Secondary | ICD-10-CM

## 2023-01-12 ENCOUNTER — Other Ambulatory Visit: Payer: Self-pay

## 2023-01-12 DIAGNOSIS — Z1231 Encounter for screening mammogram for malignant neoplasm of breast: Secondary | ICD-10-CM

## 2023-01-12 DIAGNOSIS — E1165 Type 2 diabetes mellitus with hyperglycemia: Secondary | ICD-10-CM

## 2023-01-12 MED ORDER — DEXCOM G6 SENSOR MISC: 3 refills | Status: DC

## 2023-01-12 MED ORDER — DEXCOM G6 TRANSMITTER MISC: 3 refills | Status: DC

## 2023-01-13 ENCOUNTER — Other Ambulatory Visit: Payer: Self-pay | Admitting: Physician Assistant

## 2023-01-13 DIAGNOSIS — R928 Other abnormal and inconclusive findings on diagnostic imaging of breast: Secondary | ICD-10-CM

## 2023-01-20 ENCOUNTER — Encounter: Payer: Self-pay | Admitting: Physician Assistant

## 2023-01-20 DIAGNOSIS — Z01419 Encounter for gynecological examination (general) (routine) without abnormal findings: Secondary | ICD-10-CM | POA: Diagnosis not present

## 2023-01-20 DIAGNOSIS — N951 Menopausal and female climacteric states: Secondary | ICD-10-CM | POA: Diagnosis not present

## 2023-01-26 ENCOUNTER — Ambulatory Visit
Admission: RE | Admit: 2023-01-26 | Discharge: 2023-01-26 | Disposition: A | Discharge status: Home / Self Care | Payer: BC Managed Care – PPO | Source: Ambulatory Visit | Attending: Physician Assistant | Admitting: Physician Assistant

## 2023-01-26 DIAGNOSIS — R928 Other abnormal and inconclusive findings on diagnostic imaging of breast: Secondary | ICD-10-CM

## 2023-01-26 DIAGNOSIS — R922 Inconclusive mammogram: Secondary | ICD-10-CM | POA: Diagnosis not present

## 2023-01-26 DIAGNOSIS — N6321 Unspecified lump in the left breast, upper outer quadrant: Secondary | ICD-10-CM | POA: Diagnosis not present

## 2023-02-02 DIAGNOSIS — F339 Major depressive disorder, recurrent, unspecified: Secondary | ICD-10-CM | POA: Diagnosis not present

## 2023-02-02 DIAGNOSIS — F419 Anxiety disorder, unspecified: Secondary | ICD-10-CM | POA: Diagnosis not present

## 2023-02-02 DIAGNOSIS — F909 Attention-deficit hyperactivity disorder, unspecified type: Secondary | ICD-10-CM | POA: Diagnosis not present

## 2023-02-08 ENCOUNTER — Other Ambulatory Visit: Payer: Self-pay | Admitting: Physician Assistant

## 2023-02-08 DIAGNOSIS — N6002 Solitary cyst of left breast: Secondary | ICD-10-CM

## 2023-02-22 DIAGNOSIS — F909 Attention-deficit hyperactivity disorder, unspecified type: Secondary | ICD-10-CM | POA: Diagnosis not present

## 2023-02-22 DIAGNOSIS — F419 Anxiety disorder, unspecified: Secondary | ICD-10-CM | POA: Diagnosis not present

## 2023-02-22 DIAGNOSIS — F331 Major depressive disorder, recurrent, moderate: Secondary | ICD-10-CM | POA: Diagnosis not present

## 2023-03-01 ENCOUNTER — Encounter: Payer: BC Managed Care – PPO | Attending: Endocrinology | Admitting: Dietician

## 2023-03-01 ENCOUNTER — Encounter: Payer: Self-pay | Admitting: Dietician

## 2023-03-01 DIAGNOSIS — F419 Anxiety disorder, unspecified: Secondary | ICD-10-CM | POA: Diagnosis not present

## 2023-03-01 DIAGNOSIS — F909 Attention-deficit hyperactivity disorder, unspecified type: Secondary | ICD-10-CM | POA: Diagnosis not present

## 2023-03-01 DIAGNOSIS — E139 Other specified diabetes mellitus without complications: Secondary | ICD-10-CM | POA: Insufficient documentation

## 2023-03-01 DIAGNOSIS — F331 Major depressive disorder, recurrent, moderate: Secondary | ICD-10-CM | POA: Diagnosis not present

## 2023-03-01 NOTE — Progress Notes (Signed)
Diabetes Self-Management Education  Visit Type: Follow-up  Appt. Start Time: 0815 Appt. End Time: 0845  03/01/2023  [name], identified by name and date of birth, is a 47 y.o. female with a diagnosis of Diabetes:  .   ASSESSMENT Patient is here today alone.  She was last seen by this RD on 11/30/2022. This is a virtual visit as her son had a conflicting appointment.  She is in a waiting area and I am in my office. She is going to therapy now.  Being treated for deep depression. She has started walking and is trying to get out of the depression. Started hormone replacement therapy. She has asked what diet can help with mental health. She is following a vegetarian diet and felt that vegan was too restrictive with increased amounts of stress. States that her blood glucose has been high.  Eating too much of everything. Eating improved as she is feeling better. Not eating after 8 pm.  (Eating window 10 am-8 pm) Eating out most every day.  "Overwhelmed" Lost Dexcom transmitter in March and is waiting for this to come in.  She is currently using a blood glucose meter.  Fasting 108.   Sometimes forgets her medication. 145 lbs per patient decreased from 155 lbs on 11/30/2022.  History includes:  Type 1.5 diabetes Medication includes Farxiga,  Lyumjev, Vitamin B-12, MVI Labs include:  A1C 7.5% 08/03/2022  and 7.4% 05/11/2022 Pump:  Omnipod 5 CGM:  Dexcom G6   CGM Results from download:    % Time CGM active:   64 %   (Goal >70%)  Average glucose:   147 mg/dL for 14 days  Time in range (70-180 mg/dL):   78 %   (Goal >52%)  Time High (181-250 mg/dL):   14 %   (Goal < 77%)  Time Very High (>250 mg/dL):    6 %   (Goal < 5%)  Time Low (54-69 mg/dL):   1 %   (Goal <8%)  Time Very Low (<54 mg/dL):   <1 %   (Goal <2%)    Patient lives with her husband who works 2 jobs (education and [employer]). 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 She also  has 4 boys who are adopted ages 38, 32, 17, and 48. She does the shopping and cooking. Hives with anything red, peanut butter, as well as eggplant (tomatoes, raspberries, red apples, strawberries).  Eats them occasionally.   Diabetes Self-Management Education - 03/01/23 0900       Visit Information   Visit Type Follow-up      Psychosocial Assessment   Self-care barriers None    Self-management support Doctor's office;Family;CDE visits    Other persons present Patient    Special Needs None    Preferred Learning Style No preference indicated    Learning Readiness Ready      Pre-Education Assessment   Patient understands the diabetes disease and treatment process. Demonstrates understanding / competency    Patient understands incorporating nutritional management into lifestyle. Demonstrates understanding / competency    Patient undertands incorporating physical activity into lifestyle. Demonstrates understanding / competency    Patient understands using medications safely. Demonstrates understanding / competency    Patient understands monitoring blood glucose, interpreting and using results Demonstrates understanding / competency    Patient understands prevention, detection, and treatment of acute complications. Demonstrates understanding / competency    Patient understands prevention, detection, and treatment of chronic complications. Demonstrates understanding /  competency    Patient understands how to develop strategies to address psychosocial issues. Needs Review    Patient understands how to develop strategies to promote health/change behavior. Needs Review      Complications   Fasting Blood glucose range (mg/dL) [phone]      Dietary Intake   Breakfast hummus or "just eggs"   10 am   Lunch mediterranean salad    Dinner Magazine features editormargaritta pizza    Snack (evening) none    Beverage(s) water, hot tea      Activity / Exercise   Activity / Exercise Type Light (walking / raking leaves)       Patient Education   Previous Diabetes Education Yes (please comment)   11/2022   Healthy Eating Other (comment)   self care, diet and mental health   Being Active Role of exercise on diabetes management, blood pressure control and cardiac health.    Medications Reviewed patients medication for diabetes, action, purpose, timing of dose and side effects.    Diabetes Stress and Support Identified and addressed patients feelings and concerns about diabetes;Worked with patient to identify barriers to care and solutions;Role of stress on diabetes    Lifestyle and Health Coping Lifestyle issues that need to be addressed for better diabetes care      Individualized Goals (developed by patient)   Nutrition Carb counting    Physical Activity Exercise 3-5 times per week;30 minutes per day    Medications take my medication as prescribed    Monitoring  Consistenly use CGM    Problem Solving Eating Pattern    Reducing Risk examine blood glucose patterns    Health Coping Ask for help with psychological, social, or emotional issues      Patient Self-Evaluation of Goals - Patient rates self as meeting previously set goals (% of time)   Nutrition 50 - 75 % (half of the time)    Physical Activity 25 - 50% (sometimes)    Medications 50 - 75 % (half of the time)    Monitoring >75% (most of the time)    Problem Solving and behavior change strategies  50 - 75 % (half of the time)    Reducing Risk (treating acute and chronic complications) 50 - 75 % (half of the time)    Health Coping 50 - 75 % (half of the time)      Post-Education Assessment   Patient understands the diabetes disease and treatment process. Demonstrates understanding / competency    Patient understands incorporating nutritional management into lifestyle. Needs Review    Patient undertands incorporating physical activity into lifestyle. Demonstrates understanding / competency    Patient understands using medications safely. Demonstrates  understanding / competency    Patient understands monitoring blood glucose, interpreting and using results Demonstrates understanding / competency    Patient understands prevention, detection, and treatment of acute complications. Demonstrates understanding / competency    Patient understands prevention, detection, and treatment of chronic complications. Demonstrates understanding / competency    Patient understands how to develop strategies to address psychosocial issues. Comprehends key points    Patient understands how to develop strategies to promote health/change behavior. Comprehends key points      Outcomes   Expected Outcomes Demonstrated interest in learning. Expect positive outcomes    Future DMSE PRN    Program Status Not Completed      Subsequent Visit   Since your last visit have you continued or begun to take your medications as prescribed? No  Since your last visit have you experienced any weight changes? Loss    Weight Loss (lbs) 10             Individualized Plan for Diabetes Self-Management Training:   Learning Objective:  Patient will have a greater understanding of diabetes self-management. Patient education plan is to attend individual and/or group sessions per assessed needs and concerns.   Plan:   Patient Instructions  Purchase a medicine box. Drink adequate water. Find things to give you joy  Walk or other movement for self care  This could include playing with your boys.  Nutrition: Sources of Omega 3  Complement Vitamin (lovecomplement.com)) or other plant based omega 3 vitamin and/or food sources  Walnuts  Chia seeds (chia seed pudding)  Ground flax seeds  Consider magnesium glycinate before bed  Magnesium needs are increased with stress  A magnesium bath is also an option.  Simple meal planning             Nutrition balance and avoiding skipping meals to provide adequate nutrition. Calorie [employer] - aim to consistently accurately count  carbs  Consider entering your sensor reading into your pump 2-3 hours after a meal as this improves control in some (even when using auto mode).   Resources:  websites and Visual merchandiser.org ShoppingLesson.hu  Expected Outcomes:  Demonstrated interest in learning. Expect positive outcomes  Education material provided:   If problems or questions, patient to contact team via:  Phone  Future DSME appointment: PRN

## 2023-03-01 NOTE — Patient Instructions (Addendum)
Purchase a medicine box. Drink adequate water. Find things to give you joy  Walk or other movement for self care  This could include playing with your boys.  Nutrition: Sources of Omega 3  Complement Vitamin (lovecomplement.com)) or other plant based omega 3 vitamin and/or food sources  Walnuts  Chia seeds (chia seed pudding)  Ground flax seeds  Consider magnesium glycinate before bed  Magnesium needs are increased with stress  A magnesium bath is also an option.  Simple meal planning             Nutrition balance and avoiding skipping meals to provide adequate nutrition. Calorie [employer] - aim to consistently accurately count carbs  Consider entering your sensor reading into your pump 2-3 hours after a meal as this improves control in some (even when using auto mode).   Resources:  websites and Visual merchandiser.[employer] [email or web address].hu

## 2023-03-08 DIAGNOSIS — F909 Attention-deficit hyperactivity disorder, unspecified type: Secondary | ICD-10-CM | POA: Diagnosis not present

## 2023-03-08 DIAGNOSIS — F419 Anxiety disorder, unspecified: Secondary | ICD-10-CM | POA: Diagnosis not present

## 2023-03-08 DIAGNOSIS — F331 Major depressive disorder, recurrent, moderate: Secondary | ICD-10-CM | POA: Diagnosis not present

## 2023-03-11 DIAGNOSIS — F909 Attention-deficit hyperactivity disorder, unspecified type: Secondary | ICD-10-CM | POA: Diagnosis not present

## 2023-03-11 DIAGNOSIS — F339 Major depressive disorder, recurrent, unspecified: Secondary | ICD-10-CM | POA: Diagnosis not present

## 2023-03-11 DIAGNOSIS — F419 Anxiety disorder, unspecified: Secondary | ICD-10-CM | POA: Diagnosis not present

## 2023-03-16 DIAGNOSIS — N951 Menopausal and female climacteric states: Secondary | ICD-10-CM | POA: Diagnosis not present

## 2023-03-18 DIAGNOSIS — F339 Major depressive disorder, recurrent, unspecified: Secondary | ICD-10-CM | POA: Diagnosis not present

## 2023-03-18 DIAGNOSIS — F909 Attention-deficit hyperactivity disorder, unspecified type: Secondary | ICD-10-CM | POA: Diagnosis not present

## 2023-03-18 DIAGNOSIS — F419 Anxiety disorder, unspecified: Secondary | ICD-10-CM | POA: Diagnosis not present

## 2023-03-29 DIAGNOSIS — F331 Major depressive disorder, recurrent, moderate: Secondary | ICD-10-CM | POA: Diagnosis not present

## 2023-03-29 DIAGNOSIS — F909 Attention-deficit hyperactivity disorder, unspecified type: Secondary | ICD-10-CM | POA: Diagnosis not present

## 2023-03-29 DIAGNOSIS — F419 Anxiety disorder, unspecified: Secondary | ICD-10-CM | POA: Diagnosis not present

## 2023-04-06 DIAGNOSIS — F419 Anxiety disorder, unspecified: Secondary | ICD-10-CM | POA: Diagnosis not present

## 2023-04-06 DIAGNOSIS — F331 Major depressive disorder, recurrent, moderate: Secondary | ICD-10-CM | POA: Diagnosis not present

## 2023-04-06 DIAGNOSIS — F909 Attention-deficit hyperactivity disorder, unspecified type: Secondary | ICD-10-CM | POA: Diagnosis not present

## 2023-04-07 DIAGNOSIS — F909 Attention-deficit hyperactivity disorder, unspecified type: Secondary | ICD-10-CM | POA: Diagnosis not present

## 2023-04-07 DIAGNOSIS — F419 Anxiety disorder, unspecified: Secondary | ICD-10-CM | POA: Diagnosis not present

## 2023-04-07 DIAGNOSIS — F339 Major depressive disorder, recurrent, unspecified: Secondary | ICD-10-CM | POA: Diagnosis not present

## 2023-04-07 DIAGNOSIS — E1169 Type 2 diabetes mellitus with other specified complication: Secondary | ICD-10-CM | POA: Diagnosis not present

## 2023-04-14 DIAGNOSIS — F331 Major depressive disorder, recurrent, moderate: Secondary | ICD-10-CM | POA: Diagnosis not present

## 2023-04-14 DIAGNOSIS — F909 Attention-deficit hyperactivity disorder, unspecified type: Secondary | ICD-10-CM | POA: Diagnosis not present

## 2023-04-14 DIAGNOSIS — F419 Anxiety disorder, unspecified: Secondary | ICD-10-CM | POA: Diagnosis not present

## 2023-04-16 DIAGNOSIS — F419 Anxiety disorder, unspecified: Secondary | ICD-10-CM | POA: Diagnosis not present

## 2023-04-16 DIAGNOSIS — F909 Attention-deficit hyperactivity disorder, unspecified type: Secondary | ICD-10-CM | POA: Diagnosis not present

## 2023-04-16 DIAGNOSIS — F339 Major depressive disorder, recurrent, unspecified: Secondary | ICD-10-CM | POA: Diagnosis not present

## 2023-04-28 DIAGNOSIS — F909 Attention-deficit hyperactivity disorder, unspecified type: Secondary | ICD-10-CM | POA: Diagnosis not present

## 2023-04-28 DIAGNOSIS — F419 Anxiety disorder, unspecified: Secondary | ICD-10-CM | POA: Diagnosis not present

## 2023-04-28 DIAGNOSIS — F331 Major depressive disorder, recurrent, moderate: Secondary | ICD-10-CM | POA: Diagnosis not present

## 2023-04-30 ENCOUNTER — Other Ambulatory Visit (INDEPENDENT_AMBULATORY_CARE_PROVIDER_SITE_OTHER): Payer: BC Managed Care – PPO

## 2023-04-30 DIAGNOSIS — E139 Other specified diabetes mellitus without complications: Secondary | ICD-10-CM

## 2023-04-30 LAB — BASIC METABOLIC PANEL
BUN: 14 mg/dL (ref 6–23)
CO2: 27 mEq/L (ref 19–32)
Calcium: 9.2 mg/dL (ref 8.4–10.5)
Chloride: 102 mEq/L (ref 96–112)
Creatinine, Ser: 0.83 mg/dL (ref 0.40–1.20)
GFR: 83.1 mL/min (ref >60.00)
Glucose, Bld: 97 mg/dL (ref 70–99)
Potassium: 4.4 mEq/L (ref 3.5–5.1)
Sodium: 138 mEq/L (ref 135–145)

## 2023-04-30 LAB — HEMOGLOBIN A1C: Hgb A1c MFr Bld: 7.3 % — ABNORMAL HIGH (ref 4.6–6.5)

## 2023-05-05 ENCOUNTER — Encounter: Payer: Self-pay | Admitting: Behavioral Health

## 2023-05-05 ENCOUNTER — Ambulatory Visit (INDEPENDENT_AMBULATORY_CARE_PROVIDER_SITE_OTHER): Payer: BC Managed Care – PPO | Admitting: Behavioral Health

## 2023-05-05 VITALS — BP 121/74 | HR 87 | Ht 65.0 in | Wt 146.0 lb

## 2023-05-05 DIAGNOSIS — F411 Generalized anxiety disorder: Secondary | ICD-10-CM

## 2023-05-05 DIAGNOSIS — Z6824 Body mass index (BMI) 24.0-24.9, adult: Secondary | ICD-10-CM | POA: Diagnosis not present

## 2023-05-05 DIAGNOSIS — F419 Anxiety disorder, unspecified: Secondary | ICD-10-CM | POA: Diagnosis not present

## 2023-05-05 DIAGNOSIS — F331 Major depressive disorder, recurrent, moderate: Secondary | ICD-10-CM | POA: Diagnosis not present

## 2023-05-05 DIAGNOSIS — F909 Attention-deficit hyperactivity disorder, unspecified type: Secondary | ICD-10-CM | POA: Diagnosis not present

## 2023-05-05 DIAGNOSIS — R5383 Other fatigue: Secondary | ICD-10-CM | POA: Diagnosis not present

## 2023-05-05 NOTE — Progress Notes (Signed)
[facility] MD/PA/NP Initial Note  05/05/2023 4:07 PM Mia Wilson  MRN:  [id number]  Chief Complaint:  Chief Complaint   Anxiety; Depression; Family Problem; Establish Care; Medication Problem; Patient Education; Stress     HPI:   "Mia Wilson", 47 year old female presents to this office for initial visit and to establish care.  Collateral information should be considered reliable patient is cooperative but very talkative.  Says that she is here today to find a psychiatric provider who can assist in managing her psychiatric medications.  Says that she first was diagnosed with depression back in 2004 where she was prescribed Zoloft.  She had some side effects and stopped the medication.  She stayed off any psychiatric medication until 2016 or 2017 where she was diagnosed with depression, OCD, and ADHD.  She feels like that she has only had partial success with medication and still suffers from severe symptoms at times.  Her depression and anxiety are exacerbated by behavioral problems with 2 adopted sons that are still at home.  She has another grown adopted son that has been diagnosed with bipolar and has behavioral and legal problems as well.  She reports her anxiety today 7/10, and depression at 5/10.  Her PHQ 9 score was 20.  Her MDQ had 4 of 11 criterion marked yes.  She does endorse periods of high energy spurts, lack of concentration, and racing thoughts.  She denies history of mania, no psychosis, no auditory or visual hallucinations.  Denies SI or HI.  Past psychiatric medication trials: Zoloft Wellbutrin Lexapro Cymbalta Strattera   Visit Diagnosis: No diagnosis found.  Past Psychiatric History: Anxiety, MDD, OCD, ADHD  Past Medical History:  Past Medical History:  Diagnosis Date   Back pain    Diabetes mellitus    Fibroids    PCOS (polycystic ovarian syndrome)     Past Surgical History:  Procedure Laterality Date   BREAST EXCISIONAL BIOPSY Right    BREAST SURGERY      benign   IR ANGIOGRAM PELVIS SELECTIVE OR SUPRASELECTIVE  11/17/2018   IR ANGIOGRAM PELVIS SELECTIVE OR SUPRASELECTIVE  11/17/2018   IR ANGIOGRAM SELECTIVE EACH ADDITIONAL VESSEL  11/17/2018   IR ANGIOGRAM SELECTIVE EACH ADDITIONAL VESSEL  11/17/2018   IR EMBO TUMOR ORGAN ISCHEMIA INFARCT INC GUIDE ROADMAPPING  11/17/2018   IR RADIOLOGIST EVAL & MGMT  09/14/2018   IR RADIOLOGIST EVAL & MGMT  12/13/2018   IR RADIOLOGIST EVAL & MGMT  06/28/2019   IR US GUIDE VASC ACCESS RIGHT  11/17/2018   UTERINE FIBROID SURGERY      Family Psychiatric History: none noted this visit  Family History:  Family History  Problem Relation Age of Onset   Hyperlipidemia Other    Hypertension Other    Stroke Other    Cancer Other    Diabetes Maternal Uncle    Heart disease Paternal Uncle    Diabetes Maternal Grandmother    Heart disease Maternal Grandmother    Breast cancer Maternal Grandmother        diagnosed in her 59's   Diabetes Paternal Grandmother     Social History:  Social History   Socioeconomic History   Marital status: Married    Spouse name: Not on file   Number of children: Not on file   Years of education: Not on file   Highest education level: Not on file  Occupational History   Not on file  Tobacco Use   Smoking status: Never   Smokeless tobacco: Never  Substance and Sexual Activity   Alcohol use: No   Drug use: No   Sexual activity: Yes  Other Topics Concern   Not on file  Social History Narrative   Living in [place] with husband and two adopted children 15 and 54. Currently on FMLA.  She is not employed at this time. She has been her 2 youngest adopted children at home. Not involved a lot else here at this time. She has 2 other children also. She walks a little bit but does not do a lot since she has left her with fatigue.   Social Determinants of Health   Financial Resource Strain: Not on file  Food Insecurity: Not on file  Transportation Needs: Not on file   Physical Activity: Not on file  Stress: Not on file  Social Connections: Not on file    Allergies:  Allergies  Allergen Reactions   Other Anaphylaxis    Peaches , tomatoes, strawberry   Strawberry Extract Hives    Facial hives and mouth hives   Tomato Hives    Facial hives and mouth hives    Metabolic Disorder Labs: Lab Results  Component Value Date   HGBA1C 7.3 (H) 04/30/2023   No results found for: "PROLACTIN" Lab Results  Component Value Date   CHOL 147 08/03/2022   TRIG 28.0 08/03/2022   HDL 85.20 08/03/2022   CHOLHDL 2 08/03/2022   VLDL 5.6 08/03/2022   LDLCALC 56 08/03/2022   LDLCALC 63 01/12/2022   Lab Results  Component Value Date   TSH 1.49 09/14/2019   TSH 2.08 10/25/2013    Therapeutic Level Labs: No results found for: "LITHIUM" No results found for: "VALPROATE" No results found for: "CBMZ"  Current Medications: Current Outpatient Medications  Medication Sig Dispense Refill   atorvastatin (LIPITOR) 10 MG tablet Take 10 mg by mouth daily.     Blood Glucose Monitoring Suppl (ONETOUCH VERIO FLEX SYSTEM) w/Device KIT CHECK BLOOD SUGAR BEFORE AND AFTER EACH MEAL 1 kit 0   buPROPion (WELLBUTRIN XL) 150 MG 24 hr tablet Take 300 mg by mouth every morning.     Continuous Blood Gluc Sensor (DEXCOM G6 SENSOR) MISC USE AS INSTRUCTED CHANGE EVERY 10 DAYS 9 each 3   Continuous Blood Gluc Transmit (DEXCOM G6 TRANSMITTER) MISC AS DIRECTED CHANGE EVERY 90 DAYS 1 each 3   dapagliflozin propanediol (FARXIGA) 10 MG TABS tablet TAKE 1 TABLET BY MOUTH EVERY DAY BEFORE BREAKFAST 90 tablet 1   DULoxetine (CYMBALTA) 60 MG capsule Take 60 mg by mouth daily.     Insulin Disposable Pump (OMNIPOD 5 G6 POD, GEN 5,) MISC 1 Device by Does not apply route every 3 (three) days. 30 each 2   Insulin Lispro-aabc (LYUMJEV) 100 UNIT/ML SOLN INJECT 30 UNITS INTO THE SKIN DAILY. USE MAX OF 30 UNITS DAILY VIA INSULIN PUMP 60 mL 2   Lancets (ONETOUCH DELICA PLUS LANCET30G) MISC 1 each by  Does not apply route See admin instructions. Use Onetouch Delica Plus lancets to check blood sugar before and after each meal. 200 each 3   Multiple Vitamin (MULTIVITAMIN WITH MINERALS) TABS Take 1 tablet by mouth daily.     ONETOUCH VERIO test strip USE ONETOUCH VERIO FLEX AS INSTRUCTED TO CHECK BLOOD SUGAR BEFORE AND AFTER EACH MEAL. 200 strip 3   vitamin B-12 (CYANOCOBALAMIN) 1000 MCG tablet Take 1,000 mcg by mouth daily.     No current facility-administered medications for this visit.    Medication Side Effects: none  Orders  placed this visit:  No orders of the defined types were placed in this encounter.   Psychiatric Specialty Exam:  Review of Systems  Constitutional:  Positive for unexpected weight change.  Eyes:  Positive for visual disturbance.  Gastrointestinal:  Positive for constipation.  Genitourinary:  Positive for frequency and urgency.  Allergic/Immunologic: Positive for environmental allergies.  Neurological:  Positive for dizziness.    Blood pressure 121/74, pulse 87, height 5\' 5"  (1.651 m), weight 146 lb (66.2 kg), last menstrual period 11/10/2018.Body mass index is 24.3 kg/m.  General Appearance: Casual, Neat, and Well Groomed  Eye Contact:  Good  Speech:  Clear and Coherent and Talkative  Volume:  Normal  Mood:  Anxious, Depressed, and Dysphoric  Affect:  Non-Congruent, Depressed, and Anxious  Thought Process:  Coherent  Orientation:  Full (Time, Place, and Person)  Thought Content: Logical   Suicidal Thoughts:  No  Homicidal Thoughts:  No  Memory:  WNL  Judgement:  Good  Insight:  Good  Psychomotor Activity:  Normal  Concentration:  Concentration: Good  Recall:  Good  Fund of Knowledge: Good  Language: Good  Assets:  Desire for Improvement  ADL's:  Intact  Cognition: WNL  Prognosis:  Good   Screenings:  PHQ2-9    Flowsheet Row Office Visit from 05/05/2023 in [facility] from 08/17/2022 in [facility] [place] [facility] from 02/23/2020 in [facility] [place] [facility] from 11/07/2018 in [facility] [place] [facility] from 08/15/2018 in [facility] Total Score 6 0 0 0 0  PHQ-9 Total Score 20 -- -- -- --       Receiving Psychotherapy: No   Treatment Plan/Recommendations:   Greater than 50% of face to face time with patient was spent on counseling and coordination of care. We discussed her long hx of depression, anxiety, OCD, and ADHD  since 2004. We talked about complex family issues and dynamics. Reviewed past treatment, plan of care and previous medications.  We agreed today to: Will start Vraylar 1.5 mg daily Will continue Wellbutrin 300 mg daily Will continue Cymbalta 90 mg daily Will continue Strattera 80 mg daily To follow-up in 4 weeks to reassess Will report worsening symptoms or side effects promptly Provided emergency contact information Discussed potential metabolic side effects associated with atypical antipsychotics, as well as potential risk for movement side effects. Advised pt to contact office if movement side effects occur.   Reviewed PDMP    Joan Flores, NP      Joan Flores, NP

## 2023-05-06 DIAGNOSIS — R5383 Other fatigue: Secondary | ICD-10-CM | POA: Diagnosis not present

## 2023-05-07 ENCOUNTER — Ambulatory Visit: Payer: BC Managed Care – PPO | Admitting: Endocrinology

## 2023-05-07 ENCOUNTER — Encounter: Payer: Self-pay | Admitting: Endocrinology

## 2023-05-07 VITALS — BP 110/70 | HR 76 | Ht 65.5 in | Wt 148.2 lb

## 2023-05-07 DIAGNOSIS — E119 Type 2 diabetes mellitus without complications: Secondary | ICD-10-CM

## 2023-05-07 DIAGNOSIS — E7800 Pure hypercholesterolemia, unspecified: Secondary | ICD-10-CM | POA: Diagnosis not present

## 2023-05-07 DIAGNOSIS — E139 Other specified diabetes mellitus without complications: Secondary | ICD-10-CM

## 2023-05-07 DIAGNOSIS — Z794 Long term (current) use of insulin: Secondary | ICD-10-CM

## 2023-05-07 DIAGNOSIS — Z7984 Long term (current) use of oral hypoglycemic drugs: Secondary | ICD-10-CM

## 2023-05-07 NOTE — Patient Instructions (Signed)
Carbs 30-45 avg at meals

## 2023-05-07 NOTE — Progress Notes (Signed)
[name], female   DOB: 08/25/1976, 47 y.o.   MRN: [id number]   Reason for Appointment: follow-up  History of Present Illness   Diagnosis: date of diagnosis: 2009, initially asymptomatic   RECENT history:   Current treatment regimen:  OMNIPOD 5 insulin pump  BASAL RATES: Midnight = 0.3, 5 AM = 0.45, 9 AM = 0.3 and 7 PM = 0. 45   Bolus settings: I/C ratio 12 ISF: 60, Target: 110, with correction over 140.  Insulin: Currently using Lyumjev:  Non-insulin hypoglycemic drugs: Farxiga 10 mg daily in evening  A1c is still about the same at 7.3   Blood sugar patterns, management:  She has relatively poor control of her diabetes recently because of dealing with depression and stress With this her main difficulty is high variability in her blood sugars when she had the sensor data available Also has not used the sensor for the last 5 days at least and difficult to analyze her overall control now She appears to be generally eating relatively large amounts of carbohydrate, sometimes as much is 100 g at a meal Also not focusing on protein although trying to get some more dairy products like cheese now  She has seen the dietitian 2 months ago but not able to plan her meals well as yet  She says if her blood sugars are trending below 130 she will eat a snack Also sometimes she has difficulty connecting the Dexcom and the pump and then will get out of the automated mode She is trying different sites of the sensor and the insulin pod but not always on the same side of the body  Only recently starting to do a little walking She has lost some weight  Side effects from medications: Diarrhea from metformin and abdominal discomfort   Interpretation of the CGM from her Dexcom and pump download for 2 weeks through 6/24 as follows   Summary of patterns: Overall blood sugars are relatively better controlled and stable mostly between 6 AM and 11 AM but otherwise  tend to fluctuate especially in the mid afternoon She has postprandial spikes frequently after lunch with variable postprandial readings After dinner blood sugars are not as consistently higher but still quite variable Overnight blood sugars are overall higher and tend to fluctuate in the first 3 hours after midnight Overall hypoglycemia is less significant than on her last visit and occurring sporadically only late afternoon or evening  CGM use % of time   2-week average/SD 146/46  Time in range  78      %  % Time Above 180 17  % Time above 250   % Time Below 70 1%    Previous data:  CGM use % of time   2-week average/ 138+/-41  Time in range       82 %  % Time Above 180 13  % Time above 250 2  % Time Below 70 3     PRE-MEAL Fasting Lunch Dinner Bedtime Overall  Glucose range:       Averages: 123       POST-MEAL PC Breakfast PC Lunch PC Dinner  Glucose range:     Averages:  153 158       Meals: 3 meals per day, vegetarian, tries to get protein like nuts, [place] yogurt, soy, fish  Dinner usually at 7-8 pm.Cheese sandwiich, or vegetable soup with cheese, may have 1-2 starches per meal Sometimes will have  peanut butter  sandwich for lunch  Dietician visit: Most recent: 11/2022  CDE visit: 10/15    Wt Readings from Last 3 Encounters:  05/07/23 148 lb 3.2 oz (67.2 kg)  11/30/22 155 lb (70.3 kg)  08/17/22 154 lb (69.9 kg)    LABS:  Lab Results  Component Value Date   HGBA1C 7.3 (H) 04/30/2023   HGBA1C 7.4 (H) 12/08/2022   HGBA1C 7.5 (H) 08/03/2022   Lab Results  Component Value Date   MICROALBUR <0.7 05/11/2022   LDLCALC 56 08/03/2022   CREATININE 0.83 04/30/2023       No visits with results within 1 Week(s) from this visit.  Latest known visit with results is:  Lab on 04/30/2023  Component Date Value Ref Range Status   Sodium 04/30/2023 138  135 - 145 mEq/L Final   Potassium 04/30/2023 4.4  3.5 - 5.1 mEq/L Final   Chloride 04/30/2023 102  96 - 112 mEq/L  Final   CO2 04/30/2023 27  19 - 32 mEq/L Final   Glucose, Bld 04/30/2023 97  70 - 99 mg/dL Final   BUN 29/56/2130 14  6 - 23 mg/dL Final   Creatinine, Ser 04/30/2023 0.83  0.40 - 1.20 mg/dL Final   GFR 86/57/8469 83.10  >60.00 mL/min Final   Calculated using the CKD-EPI Creatinine Equation (2021)   Calcium 04/30/2023 9.2  8.4 - 10.5 mg/dL Final   Hgb G2X MFr Bld 04/30/2023 7.3 (H)  4.6 - 6.5 % Final   Glycemic Control Guidelines for People with Diabetes:Non Diabetic:  <6%Goal of Therapy: <7%Additional Action Suggested:  >8%     Allergies as of 05/07/2023       Reactions   Other Anaphylaxis   Peaches , tomatoes, strawberry   Strawberry Extract Hives   Facial hives and mouth hives   Tomato Hives   Facial hives and mouth hives        Medication List        Accurate as of May 07, 2023  9:13 AM. If you have any questions, ask your nurse or doctor.          atomoxetine 80 MG capsule Commonly known as: STRATTERA Take 80 mg by mouth every morning.   atorvastatin 10 MG tablet Commonly known as: LIPITOR Take 10 mg by mouth daily.   buPROPion 150 MG 24 hr tablet Commonly known as: WELLBUTRIN XL Take 300 mg by mouth every morning.   cyanocobalamin 1000 MCG tablet Commonly known as: VITAMIN B12 Take 1,000 mcg by mouth daily.   dapagliflozin propanediol 10 MG Tabs tablet Commonly known as: Farxiga TAKE 1 TABLET BY MOUTH EVERY DAY BEFORE BREAKFAST   Dexcom G6 Sensor Misc USE AS INSTRUCTED CHANGE EVERY 10 DAYS   Dexcom G6 Transmitter Misc AS DIRECTED CHANGE EVERY 90 DAYS   DULoxetine 60 MG capsule Commonly known as: CYMBALTA Take 60 mg by mouth daily.   Lyumjev 100 UNIT/ML Soln Generic drug: Insulin Lispro-aabc INJECT 30 UNITS INTO THE SKIN DAILY. USE MAX OF 30 UNITS DAILY VIA INSULIN PUMP   multivitamin with minerals Tabs tablet Take 1 tablet by mouth daily.   Omnipod 5 G6 Pods (Gen 5) Misc 1 Device by Does not apply route every 3 (three) days.   OneTouch  Delica Plus Lancet30G Misc 1 each by Does not apply route See admin instructions. Use Onetouch Delica Plus lancets to check blood sugar before and after each meal.   OneTouch Verio Flex System w/Device Kit CHECK BLOOD SUGAR BEFORE AND AFTER [facility] MEAL  OneTouch Verio test strip Generic drug: glucose blood USE ONETOUCH VERIO FLEX AS INSTRUCTED TO CHECK BLOOD SUGAR BEFORE AND AFTER EACH MEAL.   progesterone 100 MG capsule Commonly known as: PROMETRIUM        Allergies:  Allergies  Allergen Reactions   Other Anaphylaxis    Peaches , tomatoes, strawberry   Strawberry Extract Hives    Facial hives and mouth hives   Tomato Hives    Facial hives and mouth hives    Past Medical History:  Diagnosis Date   Back pain    Diabetes mellitus    Fibroids    PCOS (polycystic ovarian syndrome)     Past Surgical History:  Procedure Laterality Date   BREAST EXCISIONAL BIOPSY Right    BREAST SURGERY     benign   IR ANGIOGRAM PELVIS SELECTIVE OR SUPRASELECTIVE  11/17/2018   IR ANGIOGRAM PELVIS SELECTIVE OR SUPRASELECTIVE  11/17/2018   IR ANGIOGRAM SELECTIVE EACH ADDITIONAL VESSEL  11/17/2018   IR ANGIOGRAM SELECTIVE EACH ADDITIONAL VESSEL  11/17/2018   IR EMBO TUMOR ORGAN ISCHEMIA INFARCT INC GUIDE ROADMAPPING  11/17/2018   IR RADIOLOGIST EVAL & MGMT  09/14/2018   IR RADIOLOGIST EVAL & MGMT  12/13/2018   IR RADIOLOGIST EVAL & MGMT  06/28/2019   IR US GUIDE VASC ACCESS RIGHT  11/17/2018   UTERINE FIBROID SURGERY      Family History  Problem Relation Age of Onset   Hyperlipidemia Other    Hypertension Other    Stroke Other    Cancer Other    Diabetes Maternal Uncle    Heart disease Paternal Uncle    Diabetes Maternal Grandmother    Heart disease Maternal Grandmother    Breast cancer Maternal Grandmother        diagnosed in her 92's   Diabetes Paternal Grandmother     Social History:  reports that she has never smoked. She has never used smokeless tobacco. She reports that she  does not drink alcohol and does not use drugs.  Review of Systems -  HYPERCHOLESTEROLEMIA:   Baseline LDL was only 100 and PCP has prescribed her on Lipitor 10 mg Last lipid profile shows excellent results  Lab Results  Component Value Date   CHOL 147 08/03/2022   HDL 85.20 08/03/2022   LDLCALC 56 08/03/2022   TRIG 28.0 08/03/2022   CHOLHDL 2 08/03/2022   No history of hypertension or microalbuminuria  She is overdue for the eye exam, no recent report available    Examination:   BP 110/70 (BP Location: Left Arm, Patient Position: Sitting, Cuff Size: Normal)   Pulse 76   Ht 5' 5.5" (1.664 m)   Wt 148 lb 3.2 oz (67.2 kg)   LMP 11/10/2018 (Approximate)   SpO2 99%   BMI 24.29 kg/m   Body mass index is 24.29 kg/m.   Diabetic Foot Exam - Simple   Simple Foot Form Diabetic Foot exam was performed with the following findings: Yes   Visual Inspection No deformities, no ulcerations, no other skin breakdown bilaterally: Yes Sensation Testing Intact to touch and monofilament testing bilaterally: Yes Pulse Check Posterior Tibialis and Dorsalis pulse intact bilaterally: Yes Comments     Assesment:   DIABETES type 2:  See history of present illness for detailed discussion of  current management, blood sugar patterns from CGM analysis and problems identified  Her A1c is about the same at 7.3, previously at  7.4  She is on the Omnipod 5 insulin pump using  LYUMJEV insulin and Farxiga  From the pump download blood sugar data available only from the sensor until 5 to 7 days ago Blood sugars are highly variable at meals depending on adequacy of boluses, not getting protein with every meal and generally higher carbohydrate intake including snacks Only rarely will get hypoglycemia possibly from overestimating her insulin and correction doses She can do better with keeping her pump in the automated mode Overnight blood sugars are also not consistent   Recommendations : She  will try to focus on her diet better Recommended that she try reducing her carbs to 45 g or less per meal but increase the intake of protein and healthy fats to balance after meals and reduce glycemic index She will need to try and cover more often the carbs she is having for snacks including late at night Consistent exercise with walking, may use activity mode if needed Need to review postprandial patterns again on the next visit before adjusting the carbohydrate coverage ratio Reminded her not to over treat blood sugars near normal in the afternoon as her target is already set at 130 Try to maintain the automated mode is much as possible  LIPIDS: To be followed by PCP  Borderline anemia and low MCV: Stable discussed evaluation with PCP for iron and B12 deficiency  Follow-up in 3 months   Patient Instructions  Carbs 30-45 avg at meals    Reather Littler 05/07/2023, 9:13 AM   Note: This office note was prepared with Dragon voice recognition system technology. Any transcriptional errors that result from this process are unintentional.

## 2023-05-11 ENCOUNTER — Encounter: Payer: Self-pay | Admitting: Endocrinology

## 2023-05-19 ENCOUNTER — Encounter: Payer: Self-pay | Admitting: Professional Counselor

## 2023-05-19 ENCOUNTER — Ambulatory Visit (INDEPENDENT_AMBULATORY_CARE_PROVIDER_SITE_OTHER): Payer: BC Managed Care – PPO | Admitting: Professional Counselor

## 2023-05-19 DIAGNOSIS — F331 Major depressive disorder, recurrent, moderate: Secondary | ICD-10-CM | POA: Diagnosis not present

## 2023-05-19 DIAGNOSIS — F411 Generalized anxiety disorder: Secondary | ICD-10-CM
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
## 2023-05-26 ENCOUNTER — Ambulatory Visit (INDEPENDENT_AMBULATORY_CARE_PROVIDER_SITE_OTHER): Payer: BC Managed Care – PPO | Admitting: Professional Counselor

## 2023-05-26 ENCOUNTER — Telehealth: Payer: Self-pay | Admitting: Behavioral Health

## 2023-05-26 DIAGNOSIS — F331 Major depressive disorder, recurrent, moderate: Secondary | ICD-10-CM | POA: Diagnosis not present

## 2023-05-26 DIAGNOSIS — F411 Generalized anxiety disorder: Secondary | ICD-10-CM

## 2023-05-26 NOTE — Telephone Encounter (Signed)
Pt LVM @ 1:10p regarding sample medication Arlys John prescribed.  She is having side effects she wants to discuss.  Next appt 7/10

## 2023-05-26 NOTE — Progress Notes (Addendum)
      [facility] Counselor/Therapist Progress Note  [name], MRN: [id number],    Date: 7.3.24  Time Spent: 10:04a - 11:12a   Treatment Type: Individual Therapy  Reported Symptoms: worries, stress, restlessness, parenting concerns   Mental Status Exam:  Appearance:   Neat     Behavior:  Appropriate  Motor:  Normal  Speech/Language:   Normal Rate  Affect:  Congruent  Mood:  normal  Thought process:  normal  Thought content:    WNL  Sensory/Perceptual disturbances:    WNL  Orientation:  oriented to person, place, time   Attention:  Good  Concentration:  Good  Memory:  WNL  Fund of knowledge:   Good  Insight:    Good  Judgment:   Good  Impulse Control:  Good   Risk Assessment: Danger to Self:  No Self-injurious Behavior: No Danger to Others: No Duty to Warn:no Physical Aggression / Violence:No  Access to Firearms a concern: No  Gang Involvement:No   Counselor provided person-centered counseling including active listening, affirmation, resourcing, facilitation of insight. Counselor and pt continued to build rapport. Pt and counselor reviewed pt treatment plan and discussed pt reluctance to develop some types of relaxation skills due to hx of precipitation of increase in anxiety and somatic discomfort as a result; counselor provided psychoeducation around trauma response pattern as possible reason, and suggested possibility that upon stabilization pt may come to a place of being able to approach these interventions. Pt expressed particular interest in scheduling of worry time and exposure therapy, and family systems counseling. Pt gave consent to treatment plan as customized (and differing from one initially initiated per intake). Pt voiced adjustment to medications Strattera and Rexulti to be difficult and as having made self choice to stop taking per marked unpleasant side effects of restlessness, fatigue, sense of disconnection with body and being  overmedicated; pt to follow up with prescribing provider to discuss. Counselor affirmed pt listening to her body and reaching out for medical guidance and for tracking of symptoms. Counselor and pt discussed pt experience of toxic stress by hx and at this time, and pt reflected on her strengths including gratitude and a positive attitude.   Interventions: Solution-Oriented/Positive Psychology, Humanistic/Existential, Psycho-education/Bibliotherapy, and Insight-Oriented  Diagnosis:   ICD-10-CM   1. Major depressive disorder, recurrent episode, moderate (HCC)  F33.1     2. Generalized anxiety disorder  F41.1       Plan: Pt is scheduled for a follow-up; continue to build rapport, assess hx and symptoms, track medication symptoms; process work  Gaspar Bidding, [facility]

## 2023-05-26 NOTE — Telephone Encounter (Signed)
LVM to RC 

## 2023-05-28 NOTE — Telephone Encounter (Signed)
Patient was seen by you for the first time on 6/12. She said you gave her samples of Vraylar 1.5. She reports feeling arms and legs are restless and she feels like she has arthritis. She reports she ran out of samples and does not want to continue the medication. She has FU on 7/10.

## 2023-05-29 ENCOUNTER — Encounter: Payer: Self-pay | Admitting: Professional Counselor

## 2023-05-29 NOTE — Patient Instructions (Signed)
Pt to return for follow-up in one week  Pt to contact prescribing provider regarding medication management issues

## 2023-06-02 ENCOUNTER — Encounter: Payer: Self-pay | Admitting: Behavioral Health

## 2023-06-02 ENCOUNTER — Ambulatory Visit (INDEPENDENT_AMBULATORY_CARE_PROVIDER_SITE_OTHER): Payer: BC Managed Care – PPO | Admitting: Behavioral Health

## 2023-06-02 DIAGNOSIS — Z23 Encounter for immunization: Secondary | ICD-10-CM

## 2023-06-02 DIAGNOSIS — F411 Generalized anxiety disorder: Secondary | ICD-10-CM

## 2023-06-02 DIAGNOSIS — F331 Major depressive disorder, recurrent, moderate: Secondary | ICD-10-CM | POA: Diagnosis not present

## 2023-06-02 MED ORDER — AUVELITY 45-105 MG PO TBCR
EXTENDED_RELEASE_TABLET | ORAL | 1 refills | Status: DC
Start: 2023-06-02 — End: 2023-06-29

## 2023-06-02 MED ORDER — DULOXETINE HCL 60 MG PO CPEP: 60.0000 mg | ORAL_CAPSULE | Freq: Every day | ORAL | 1 refills | Status: DC

## 2023-06-02 NOTE — Progress Notes (Signed)
[facility] Med Check  [name],  MRN: [id number]  PCP: Milus Height, PA  Date of Evaluation: 06/02/2023 Time spent:30 minutes  Chief Complaint:  Chief Complaint   ADHD; Anxiety; Depression; Follow-up; Medication Refill     HISTORY/CURRENT STATUS: HPI  "Mia Wilson", 47 year old female presents to this office for follow up and medication management.  Collateral information should be considered reliable. She is more calm this visit and less talkative. Says that she had to stop Vraylar because she was experiencing restlessness in legs. She would like to consider other alternatives at this time to help with depression. She also would like to consider restarting her Strattera or considering another ADHD medication but will wait for now because depression is her priority.   She reports her anxiety today 4/10, and depression at 5/10.  She denies history of mania, no psychosis, no auditory or visual hallucinations.  Denies SI or HI.   Past psychiatric medication trials: Zoloft Wellbutrin Lexapro Cymbalta Strattera      Individual Medical History/ Review of Systems: Changes? :No   Allergies: Other, Strawberry extract, and Tomato  Current Medications:  Current Outpatient Medications:    Dextromethorphan-buPROPion ER (AUVELITY) 45-105 MG TBCR, Take one tablet by mouth for 3 days, then take two tablets daily 8 hours between doses, Disp: 60 tablet, Rfl: 1   atomoxetine (STRATTERA) 80 MG capsule, Take 80 mg by mouth every morning., Disp: , Rfl:    atorvastatin (LIPITOR) 10 MG tablet, Take 10 mg by mouth daily., Disp: , Rfl:    Blood Glucose Monitoring Suppl (ONETOUCH VERIO FLEX SYSTEM) w/Device KIT, CHECK BLOOD SUGAR BEFORE AND AFTER EACH MEAL, Disp: 1 kit, Rfl: 0   buPROPion (WELLBUTRIN XL) 150 MG 24 hr tablet, Take 300 mg by mouth every morning., Disp: , Rfl:    Continuous Blood Gluc Sensor (DEXCOM G6 SENSOR) MISC, USE AS INSTRUCTED CHANGE EVERY 10 DAYS, Disp: 9  each, Rfl: 3   Continuous Blood Gluc Transmit (DEXCOM G6 TRANSMITTER) MISC, AS DIRECTED CHANGE EVERY 90 DAYS, Disp: 1 each, Rfl: 3   dapagliflozin propanediol (FARXIGA) 10 MG TABS tablet, TAKE 1 TABLET BY MOUTH EVERY DAY BEFORE BREAKFAST, Disp: 90 tablet, Rfl: 1   DULoxetine (CYMBALTA) 60 MG capsule, Take 1 capsule (60 mg total) by mouth daily., Disp: 90 capsule, Rfl: 1   Insulin Disposable Pump (OMNIPOD 5 G6 POD, GEN 5,) MISC, 1 Device by Does not apply route every 3 (three) days., Disp: 30 each, Rfl: 2   Insulin Lispro-aabc (LYUMJEV) 100 UNIT/ML SOLN, INJECT 30 UNITS INTO THE SKIN DAILY. USE MAX OF 30 UNITS DAILY VIA INSULIN PUMP, Disp: 60 mL, Rfl: 2   Lancets (ONETOUCH DELICA PLUS LANCET30G) MISC, 1 each by Does not apply route See admin instructions. Use Onetouch Delica Plus lancets to check blood sugar before and after each meal., Disp: 200 each, Rfl: 3   Multiple Vitamin (MULTIVITAMIN WITH MINERALS) TABS, Take 1 tablet by mouth daily., Disp: , Rfl:    ONETOUCH VERIO test strip, USE ONETOUCH VERIO FLEX AS INSTRUCTED TO CHECK BLOOD SUGAR BEFORE AND AFTER EACH MEAL., Disp: 200 strip, Rfl: 3   progesterone (PROMETRIUM) 100 MG capsule, , Disp: , Rfl:    vitamin B-12 (CYANOCOBALAMIN) 1000 MCG tablet, Take 1,000 mcg by mouth daily., Disp: , Rfl:  Medication Side Effects: none  Family Medical/ Social History: Changes? No  MENTAL HEALTH EXAM:  Last menstrual period 11/10/2018.There is no height or weight on file to calculate BMI.  General Appearance: Casual, Neat,  and Well Groomed  Eye Contact:  Good  Speech:  Clear and Coherent  Volume:  Normal  Mood:  Depressed and Dysphoric  Affect:  Depressed  Thought Process:  Coherent  Orientation:  Full (Time, Place, and Person)  Thought Content: Logical   Suicidal Thoughts:  No  Homicidal Thoughts:  No  Memory:  WNL  Judgement:  Good  Insight:  Good  Psychomotor Activity:  Normal  Concentration:  Concentration: Good  Recall:  Good  Fund of  Knowledge: Good  Language: Good  Assets:  Desire for Improvement  ADL's:  Intact  Cognition: WNL  Prognosis:  Good    DIAGNOSES:    ICD-10-CM   1. Major depressive disorder, recurrent episode, moderate (HCC)  F33.1 Dextromethorphan-buPROPion ER (AUVELITY) 45-105 MG TBCR    2. Generalized anxiety disorder  F41.1     3. Need for prophylactic vaccination and inoculation against influenza  Z23 DULoxetine (CYMBALTA) 60 MG capsule      Receiving Psychotherapy: No    RECOMMENDATIONS:   Greater than 50% of face to face time with patient was spent on counseling and coordination of care. We discussed her trial of Vraylar and side effects. I explained to always notify this office because there may be alternatives instead of entirely stopping medication. We did discuss other options today.  We agreed today to: Will start Auvelity 45-105 mg for 3 days, then 90-210 mg daily.  Will continue Wellbutrin 300 mg daily Will continue Cymbalta 90 mg daily Not currently taking Strattera 80 mg daily To follow-up in 4 weeks to reassess Will report worsening symptoms or side effects promptly Provided emergency contact information Discussed potential metabolic side effects associated with atypical antipsychotics, as well as potential risk for movement side effects. Advised pt to contact office if movement side effects occur.   Reviewed PDMP       Joan Flores, NP

## 2023-06-03 ENCOUNTER — Ambulatory Visit: Payer: BC Managed Care – PPO | Admitting: Professional Counselor

## 2023-06-03 DIAGNOSIS — F331 Major depressive disorder, recurrent, moderate: Secondary | ICD-10-CM

## 2023-06-03 DIAGNOSIS — F411 Generalized anxiety disorder: Secondary | ICD-10-CM

## 2023-06-03 NOTE — Progress Notes (Addendum)
      [facility] Counselor/Therapist Progress Note  Patient ID: RASCHELLE MANJARRES, MRN: [id number],    Date: 7.11.24  Time Spent: 12:15p - 1:29p   Treatment Type: Individual Therapy  Reported Symptoms: worries, stress, overwhelm, fears, restlessness, sadness  Mental Status Exam:  Appearance:    Neat  Behavior:  Appropriate and Sharing  Motor:  Normal  Speech/Language:   Normal Rate  Affect:  Appropriate and Congruent  Mood:  normal  Thought process:  normal  Thought content:    WNL   Sensory/Perceptual disturbances:    WNL  Orientation:  oriented to person, place, time/date, and situation  Attention:  Good  Concentration:  Good  Memory:  WNL  Fund of knowledge:   Good  Insight:    Good  Judgment:   Good  Impulse Control:  Good   Risk Assessment: Danger to Self:  No Self-injurious Behavior: No Danger to Others: No Duty to Warn:no Physical Aggression / Violence:No  Access to Firearms a concern: No  Gang Involvement:No   Subjective: Pt presented to session reporting adjustment in medication regimen to be favorable since last session; pt reported sense of tearfulness that she was able to mitigate, which she attributes to efficacy of medication.Pt processed experience of work stress and challenges with interpersonal dynamics therein. Counselor assisted pt with inner resorucing around her sense of being part of a team, and being valued. Pt processed her thoughts and feelings around her spirituality and responsibility to God where parenting is concerned; she and counselor discussed these responsibilities, and counselor affirmed pt sense of also needing to be responsible for her own well-being, and giving burdens to God, too. Pt identified desire to take vacation unto herself to rest and reflect, and counselor affirmed self care objectives. Pt and counselor discussed treatment plan, and pt gave her consent.  Interventions: Solution-Oriented/Positive Psychology,  Humanistic/Existential, and Insight-Oriented  Diagnosis:   ICD-10-CM   1. Major depressive disorder, recurrent episode, moderate (HCC)  F33.1     2. Generalized anxiety disorder  F41.1       Plan: Pt is scheduled for a follow-up. 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   Gaspar Bidding, [facility]

## 2023-06-04 DIAGNOSIS — Z23 Encounter for immunization: Secondary | ICD-10-CM | POA: Diagnosis not present

## 2023-06-04 DIAGNOSIS — S61451A Open bite of right hand, initial encounter: Secondary | ICD-10-CM | POA: Diagnosis not present

## 2023-06-04 DIAGNOSIS — S61452A Open bite of left hand, initial encounter: Secondary | ICD-10-CM | POA: Diagnosis not present

## 2023-06-07 ENCOUNTER — Encounter: Payer: Self-pay | Admitting: Professional Counselor

## 2023-06-07 ENCOUNTER — Telehealth: Payer: Self-pay

## 2023-06-07 NOTE — Telephone Encounter (Signed)
Prior Authorization submitted for AUVELITY 45-105 MG, approval received effective 06/07/2023-06/06/2024 with [employer]

## 2023-06-09 ENCOUNTER — Encounter: Payer: Self-pay | Admitting: Professional Counselor

## 2023-06-09 ENCOUNTER — Ambulatory Visit (INDEPENDENT_AMBULATORY_CARE_PROVIDER_SITE_OTHER): Payer: BC Managed Care – PPO | Admitting: Professional Counselor

## 2023-06-09 DIAGNOSIS — F411 Generalized anxiety disorder: Secondary | ICD-10-CM | POA: Diagnosis not present

## 2023-06-09 DIAGNOSIS — F331 Major depressive disorder, recurrent, moderate: Secondary | ICD-10-CM

## 2023-06-09 NOTE — Progress Notes (Signed)
      [facility] Counselor/Therapist Progress Note  [name], MRN: [id number],    Date: 06/09/2023  Time Spent: 2;02p - 3:08p  Treatment Type: Individual Therapy  Reported Symptoms: hypervigilance, worries, stress, nervousness, parenting concerns   Mental Status Exam:  Appearance:   Neat     Behavior:  Appropriate, Sharing, and Motivated  Motor:  Normal  Speech/Language:   Clear and Coherent and Normal Rate  Affect:  Appropriate and Congruent  Mood:  normal  Thought process:  normal  Thought content:    WNL  Sensory/Perceptual disturbances:    WNL  Orientation:  oriented to person, place, and time/date  Attention:  Good  Concentration:  Good  Memory:  WNL  Fund of knowledge:   Good  Insight:    Good  Judgment:   Good  Impulse Control:  Good   Risk Assessment: Danger to Self:  No Self-injurious Behavior: No Danger to Others: No Duty to Warn:no Physical Aggression / Violence:No  Access to Firearms a concern: No  Gang Involvement:No   Subjective: Counselor provided person-centered counseling including affirmation, resourcing, facilitation of insight, active listening. Pt presented to session with report of efficacy of new medication; she reported feeling more productive, greater desire to do things, less fogginess, greater clarity, improved memory, less presence of tearfulness urge. She processed experience of significant health event for beloved pet which she and husband managed together in a moment of crisis. Pt voiced having sustained injuries from event but reported healing. Pt processed discernment around need to put her son's dog down and her sadness unto herself and on behalf of her family. Pt explored challenges with her sons and impact on her and family life, and she and counselor discussed variable resources for ongoing support; pt voiced intention to follow-up.   Interventions: Solution-Oriented/Positive Psychology, Humanistic/Existential, and  Insight-Oriented  Diagnosis:   ICD-10-CM   1. Major depressive disorder, recurrent episode, moderate (HCC)  F33.1     2. Generalized anxiety disorder  F41.1       Plan: Pt to return in one week. Continue process work and Conservation officer, historic buildings.   Gaspar Bidding, [facility]

## 2023-06-11 ENCOUNTER — Telehealth: Payer: Self-pay | Admitting: Endocrinology

## 2023-06-11 NOTE — Telephone Encounter (Addendum)
Patient is calling to say that her blood sugar level is at 357 at this time and she cannot seem to get it down.  Patient has had high level since 11:44 AM today. 11:44 AM 261  took insulin afterward 12:30 PM 281  took insulin afterward 2:11 PM 352  took insulin afterward 2:21 PM 357  at 2:21 took double dose of insulin  Patient states she changed her Omnipod at 11:44 AM because she thought it was malfunctioning.  Patient started new medication Auvelity on Saturday, 06/05/2023 and was on anantibiotic for dog bites until Wednesday, 06/09/2023.

## 2023-06-11 NOTE — Telephone Encounter (Signed)
I called and spoke with the patient. She states that all her readings were via fingerstick and at 338pm her blood sugar was 247 so it is coming down and she will call back in 15 mins with her reading.

## 2023-06-11 NOTE — Telephone Encounter (Signed)
FYI,Pt has been notified and her blood sugar is down to 190

## 2023-06-18 ENCOUNTER — Ambulatory Visit (INDEPENDENT_AMBULATORY_CARE_PROVIDER_SITE_OTHER): Payer: BC Managed Care – PPO | Admitting: Professional Counselor

## 2023-06-18 ENCOUNTER — Encounter: Payer: Self-pay | Admitting: Professional Counselor

## 2023-06-18 DIAGNOSIS — F411 Generalized anxiety disorder: Secondary | ICD-10-CM | POA: Diagnosis not present

## 2023-06-18 DIAGNOSIS — F331 Major depressive disorder, recurrent, moderate: Secondary | ICD-10-CM | POA: Diagnosis not present

## 2023-06-18 NOTE — Progress Notes (Unsigned)
      [facility] Counselor/Therapist Progress Note  [name], MRN: [id number],    Date: 06/18/2023  Time Spent: 12:15p - 1:11p   Treatment Type: Individual Therapy  Reported Symptoms: worries, sadness, frustration, stress, overwhelm, interpersonal concerns, restlessness, tearfulness  Mental Status Exam:  Appearance:   Neat     Behavior:  Appropriate and Sharing  Motor:  Normal  Speech/Language:   Clear and Coherent and Normal Rate  Affect:  Appropriate and Congruent  Mood:  normal  Thought process:  normal  Thought content:    WNL  Sensory/Perceptual disturbances:    WNL  Orientation:  oriented to person, place, time/date, and situation  Attention:  Good  Concentration:  Good  Memory:  WNL  Fund of knowledge:   Good  Insight:    Good  Judgment:   Good  Impulse Control:  Good   Risk Assessment: Danger to Self:  No Self-injurious Behavior: No Danger to Others: No Duty to Warn:no Physical Aggression / Violence:No  Access to Firearms a concern: No  Gang Involvement:No   Subjective: Pt presented to session concerned for recent drop in mood after a challenging birthday and difficulties with her insulin pump. She voiced it hard not to cry, where she had been feeling that her overall propensity for tearfulness had subsided. Pt voiced having signed up for time with an accountability coach at work and feeling hopeful about this, and overall efforts to make a decision a about career. She identified considerable stressors with work, and impact on her well-being, including a sense of dismissiveness around her mental health concerns. She processed existence of fight with her son and ways she coped. Counselor actively listened, helped to facilitate insight, and affirmed pt. She and counselor continued to discuss pt need to step back and find space in her life for more attention to self and more time for joy. Counselor assisted with resourcing. Pt identified photography  as a creative love that she recently reconnected with and hopes to nurture as a hobby.   Interventions: Solution-Oriented/Positive Psychology, Humanistic/Existential, and Insight-Oriented  Diagnosis:   ICD-10-CM   1. Major depressive disorder, recurrent episode, moderate (HCC)  F33.1     2. Generalized anxiety disorder  F41.1       Plan: Pt is scheduled for a follow-up. Continue process work and Conservation officer, historic buildings.   Gaspar Bidding, [facility]

## 2023-06-25 ENCOUNTER — Ambulatory Visit (INDEPENDENT_AMBULATORY_CARE_PROVIDER_SITE_OTHER): Payer: BC Managed Care – PPO | Admitting: Professional Counselor

## 2023-06-25 DIAGNOSIS — F411 Generalized anxiety disorder: Secondary | ICD-10-CM

## 2023-06-25 DIAGNOSIS — F4312 Post-traumatic stress disorder, chronic: Secondary | ICD-10-CM

## 2023-06-25 DIAGNOSIS — F331 Major depressive disorder, recurrent, moderate: Secondary | ICD-10-CM | POA: Diagnosis not present

## 2023-06-28 ENCOUNTER — Other Ambulatory Visit (HOSPITAL_COMMUNITY): Payer: Self-pay

## 2023-06-29 ENCOUNTER — Encounter: Payer: Self-pay | Admitting: Behavioral Health

## 2023-06-29 ENCOUNTER — Ambulatory Visit (INDEPENDENT_AMBULATORY_CARE_PROVIDER_SITE_OTHER): Payer: BC Managed Care – PPO | Admitting: Behavioral Health

## 2023-06-29 ENCOUNTER — Other Ambulatory Visit: Payer: Self-pay | Admitting: Behavioral Health

## 2023-06-29 DIAGNOSIS — F411 Generalized anxiety disorder: Secondary | ICD-10-CM | POA: Diagnosis not present

## 2023-06-29 DIAGNOSIS — F331 Major depressive disorder, recurrent, moderate: Secondary | ICD-10-CM

## 2023-06-29 NOTE — Progress Notes (Signed)
[facility] Med Check  [name],  MRN: [id number]  PCP: Milus Height, PA  Date of Evaluation: 06/29/2023 Time spent:30 minutes  Chief Complaint:  Chief Complaint   Anxiety; Depression; Follow-up; Patient Education; Medication Refill; Family Problem     HISTORY/CURRENT STATUS: HPI  "Mia Wilson", 47 year old female presents to this office for follow up and medication management.  Collateral information should be considered reliable. She is more calm this visit and less talkative. Says she is 80% improved and happy with her medication so far. Requesting no changes this visit.  She reports her anxiety today 2/10, and depression at 3/10.  She denies history of mania, no psychosis, no auditory or visual hallucinations.  Denies SI or HI.   Past psychiatric medication trials: Zoloft Wellbutrin Lexapro Cymbalta Strattera        Individual Medical History/ Review of Systems: Changes? :No   Allergies: Other, Strawberry extract, and Tomato  Current Medications:  Current Outpatient Medications:    atomoxetine (STRATTERA) 80 MG capsule, Take 80 mg by mouth every morning., Disp: , Rfl:    atorvastatin (LIPITOR) 10 MG tablet, Take 10 mg by mouth daily., Disp: , Rfl:    Blood Glucose Monitoring Suppl (ONETOUCH VERIO FLEX SYSTEM) w/Device KIT, CHECK BLOOD SUGAR BEFORE AND AFTER EACH MEAL, Disp: 1 kit, Rfl: 0   buPROPion (WELLBUTRIN XL) 150 MG 24 hr tablet, Take 300 mg by mouth every morning., Disp: , Rfl:    Continuous Blood Gluc Sensor (DEXCOM G6 SENSOR) MISC, USE AS INSTRUCTED CHANGE EVERY 10 DAYS, Disp: 9 each, Rfl: 3   Continuous Blood Gluc Transmit (DEXCOM G6 TRANSMITTER) MISC, AS DIRECTED CHANGE EVERY 90 DAYS, Disp: 1 each, Rfl: 3   dapagliflozin propanediol (FARXIGA) 10 MG TABS tablet, TAKE 1 TABLET BY MOUTH EVERY DAY BEFORE BREAKFAST, Disp: 90 tablet, Rfl: 1   Dextromethorphan-buPROPion ER (AUVELITY) 45-105 MG TBCR, Take one tablet by mouth for 3 days, then  take two tablets daily 8 hours between doses, Disp: 60 tablet, Rfl: 1   DULoxetine (CYMBALTA) 60 MG capsule, Take 1 capsule (60 mg total) by mouth daily., Disp: 90 capsule, Rfl: 1   Insulin Disposable Pump (OMNIPOD 5 G6 POD, GEN 5,) MISC, 1 Device by Does not apply route every 3 (three) days., Disp: 30 each, Rfl: 2   Insulin Lispro-aabc (LYUMJEV) 100 UNIT/ML SOLN, INJECT 30 UNITS INTO THE SKIN DAILY. USE MAX OF 30 UNITS DAILY VIA INSULIN PUMP, Disp: 60 mL, Rfl: 2   Lancets (ONETOUCH DELICA PLUS LANCET30G) MISC, 1 each by Does not apply route See admin instructions. Use Onetouch Delica Plus lancets to check blood sugar before and after each meal., Disp: 200 each, Rfl: 3   Multiple Vitamin (MULTIVITAMIN WITH MINERALS) TABS, Take 1 tablet by mouth daily., Disp: , Rfl:    ONETOUCH VERIO test strip, USE ONETOUCH VERIO FLEX AS INSTRUCTED TO CHECK BLOOD SUGAR BEFORE AND AFTER EACH MEAL., Disp: 200 strip, Rfl: 3   progesterone (PROMETRIUM) 100 MG capsule, , Disp: , Rfl:    vitamin B-12 (CYANOCOBALAMIN) 1000 MCG tablet, Take 1,000 mcg by mouth daily., Disp: , Rfl:  Medication Side Effects: none  Family Medical/ Social History: Changes? No  MENTAL HEALTH EXAM:  Last menstrual period 11/10/2018.There is no height or weight on file to calculate BMI.  General Appearance: Casual and Well Groomed  Eye Contact:  Good  Speech:  Clear and Coherent  Volume:  Normal  Mood:  Anxious and Depressed  Affect:  Appropriate  Thought Process:  Coherent  Orientation:  Full (Time, Place, and Person)  Thought Content: Logical   Suicidal Thoughts:  No  Homicidal Thoughts:  No  Memory:  WNL  Judgement:  Good  Insight:  Good  Psychomotor Activity:  Normal  Concentration:  Concentration: Good  Recall:  Good  Fund of Knowledge: Good  Language: Good  Assets:  Desire for Improvement  ADL's:  Intact  Cognition: WNL  Prognosis:  Good    DIAGNOSES:    ICD-10-CM   1. Uncontrolled type 2 diabetes mellitus with  hyperglycemia, with long-term current use of insulin (HCC)  E11.65    Z79.4       Receiving Psychotherapy: No    RECOMMENDATIONS:   Greater than 50% of face to face time with patient was spent on counseling and coordination of care. Discussed her significant improvement with Auvelity. She requested no changes this visit. We also discussed changes in social dynamics with her family. Her work life and performance has improved recently.  We agreed today to: Will start Auvelity 45-105 mg for 3 days, then 90-210 mg daily.  Will continue Wellbutrin 300 mg daily Will continue Cymbalta 90 mg daily Not currently taking Strattera 80 mg daily To follow-up in 8 weeks to reassess Will report worsening symptoms or side effects promptly Provided emergency contact information Discussed potential metabolic side effects associated with atypical antipsychotics, as well as potential risk for movement side effects. Advised pt to contact office if movement side effects occur.   Reviewed PDMP     Joan Flores, NP

## 2023-07-01 ENCOUNTER — Telehealth: Payer: Self-pay | Admitting: Pharmacy Technician

## 2023-07-01 ENCOUNTER — Encounter: Payer: Self-pay | Admitting: Professional Counselor

## 2023-07-01 ENCOUNTER — Other Ambulatory Visit (HOSPITAL_COMMUNITY): Payer: Self-pay

## 2023-07-01 NOTE — Telephone Encounter (Addendum)
Pharmacy Patient Advocate Encounter   Received notification from Fax that prior authorization for Mia Wilson is required/requested.   Insurance verification completed.   The patient is insured through [employer] .   Pharmacy Patient Advocate Encounter  Received notification from [employer] that Prior Authorization for Mia Wilson has been APPROVED from 06/01/23 to 06/30/24. Ran test claim, Copay is $50. This test claim was processed through [facility]- copay amounts may vary at other pharmacies due to pharmacy/plan contracts, or as the patient moves through the different stages of their insurance plan.  [id number] PA #/Case ID/Reference #: [id number]

## 2023-07-01 NOTE — Progress Notes (Signed)
      [facility] Counselor/Therapist Progress Note  [name], MRN: [id number],    Date: 8.2.2024  Time Spent: 12:15p - 1:28p   Treatment Type: Individual Therapy  Reported Symptoms: worries, fears, trauma response pattern, hypervigilance, grief/loss, sadness, restlessness, somatic concerns  Mental Status Exam:  Appearance:   Neat     Behavior:  Appropriate, Sharing, and Motivated  Motor:  Normal  Speech/Language:   Clear and Coherent and Normal Rate  Affect:  Appropriate and Congruent  Mood:  normal  Thought process:  normal  Thought content:    WNL  Sensory/Perceptual disturbances:    WNL  Orientation:  oriented to person, place, time/date, and situation  Attention:  Good  Concentration:  Good  Memory:  WNL  Fund of knowledge:   Good  Insight:    Good  Judgment:   Good  Impulse Control:  Good   Risk Assessment: Danger to Self:  No Self-injurious Behavior: No Danger to Others: No Duty to Warn:no Physical Aggression / Violence:No  Access to Firearms a concern: No  Gang Involvement:No   Subjective: Pt presented to session with concern for increased trauma response pattern related to past parenting trauma that resonates in the present with certain triggers and by her memories. She reported continued and complex hypervigilance and described experiences of lack of control with her efforts, that then causes distress. She processed the experience of original events that she reported made her feel out of her body and led her to react in a dysregulated way that she recalls vividly. She mourned the loss of innocence that she felt on her and family's behalf; pt experienced stomach pain in vivo as she recalled the experience. Counselor held space for pt and actively listened, and affirmed pt. Pt identified preoccupation with the "why" of the matter. Counselor assisted pt with resourcing visualization of ocean and related concepts to soothe mental anguish and to  conceptualize suffering in a way that was titrated and manageable. Pt voiced positive response to practice and voiced intention to practice embodying conceptualization outside of therapy. Counselor and pt discussed pt prayer life as resource, and developmental phase psychoeducation as relates to her preoccupations.  Interventions: Mindfulness Meditation, Solution-Oriented/Positive Psychology, Humanistic/Existential, and Insight-Oriented, Spiritually-Integrated Psychotherapy  Diagnosis:   ICD-10-CM   1. Post-traumatic stress disorder, chronic  F43.12     2. Major depressive disorder, recurrent episode, moderate (HCC)  F33.1     3. Generalized anxiety disorder  F41.1       Plan: Pt is scheduled for a follow-up; continue process work and developing coping skills. Pt to practice inner resourcing as discussed.  Gaspar Bidding, [facility]

## 2023-07-02 ENCOUNTER — Ambulatory Visit (INDEPENDENT_AMBULATORY_CARE_PROVIDER_SITE_OTHER): Payer: BC Managed Care – PPO | Admitting: Professional Counselor

## 2023-07-02 ENCOUNTER — Encounter: Payer: Self-pay | Admitting: Professional Counselor

## 2023-07-02 DIAGNOSIS — F411 Generalized anxiety disorder: Secondary | ICD-10-CM | POA: Diagnosis not present

## 2023-07-02 DIAGNOSIS — F331 Major depressive disorder, recurrent, moderate: Secondary | ICD-10-CM

## 2023-07-02 DIAGNOSIS — F4312 Post-traumatic stress disorder, chronic: Secondary | ICD-10-CM | POA: Diagnosis not present

## 2023-07-02 NOTE — Progress Notes (Unsigned)
      [facility] Counselor/Therapist Progress Note  [name], MRN: [id number],    Date: 07/02/2023  Time Spent: 12:14p - 1:15p   Treatment Type: Individual Therapy  Reported Symptoms: worries, fears, sadness, stress, self care concerns, fatigue, preoccupation thoughts  Mental Status Exam:  Appearance:   Neat     Behavior:  Appropriate, Sharing, and Motivated  Motor:  Normal  Speech/Language:   Clear and Coherent and Normal Rate  Affect:  Appropriate and Congruent  Mood:  normal  Thought process:  normal  Thought content:    WNL  Sensory/Perceptual disturbances:    WNL  Orientation:  oriented to person, place, time/date, and situation  Attention:  Good  Concentration:  Good  Memory:  WNL  Fund of knowledge:   Good  Insight:    Good  Judgment:   Good  Impulse Control:  Good   Risk Assessment: Danger to Self:  No Self-injurious Behavior: No Danger to Others: No Duty to Warn:no Physical Aggression / Violence:No  Access to Firearms a concern: No  Gang Involvement:No   Subjective: Pt presented to session voicing distress and frustration regarding sense of self care deficits. She processed experience and feelings around how this is happening for her, and counselor assisted pt with resourcing a plan for self nurture. They discussed how this is a priority in her life and how to incrementally build toward a regular expectation for self care. Counselor provided pt with resources for external supports, and they discussed additional goals. Pt identified ways she was going to incrementally achieve goals including facets such as wardrobe refresh, sleep hygiene, cooking routine, date night. Pt resourced how her goals were a reflection of the grit and perseverance she admires in others and knows to be her natural orientation to self and life also.    Interventions: Solution-Oriented/Positive Psychology, Humanistic/Existential, and Insight-Oriented  Diagnosis:    ICD-10-CM   1. Major depressive disorder, recurrent episode, moderate (HCC)  F33.1     2. Generalized anxiety disorder  F41.1     3. Post-traumatic stress disorder, chronic  F43.12       Plan: Pt is scheduled for a follow-up. Continue process work and Conservation officer, historic buildings. Assess pt progress with personal goals.   Gaspar Bidding, [facility]

## 2023-07-05 ENCOUNTER — Telehealth: Payer: Self-pay

## 2023-07-05 NOTE — Telephone Encounter (Signed)
Called patient to inform her that the PA for Marcelline Deist was APPROVED. LVM to call the office if she had any questions

## 2023-07-09 ENCOUNTER — Ambulatory Visit: Payer: BC Managed Care – PPO | Admitting: Professional Counselor

## 2023-07-16 ENCOUNTER — Ambulatory Visit: Payer: BC Managed Care – PPO | Admitting: Professional Counselor

## 2023-07-30 ENCOUNTER — Ambulatory Visit (INDEPENDENT_AMBULATORY_CARE_PROVIDER_SITE_OTHER): Payer: BC Managed Care – PPO | Admitting: Professional Counselor

## 2023-07-30 ENCOUNTER — Encounter: Payer: Self-pay | Admitting: Professional Counselor

## 2023-07-30 ENCOUNTER — Other Ambulatory Visit: Payer: Self-pay | Admitting: Physician Assistant

## 2023-07-30 ENCOUNTER — Ambulatory Visit
Admission: RE | Admit: 2023-07-30 | Discharge: 2023-07-30 | Disposition: A | Discharge status: Home / Self Care | Payer: BC Managed Care – PPO | Source: Ambulatory Visit | Attending: Physician Assistant | Admitting: Physician Assistant

## 2023-07-30 DIAGNOSIS — N6321 Unspecified lump in the left breast, upper outer quadrant: Secondary | ICD-10-CM | POA: Diagnosis not present

## 2023-07-30 DIAGNOSIS — N6320 Unspecified lump in the left breast, unspecified quadrant: Secondary | ICD-10-CM

## 2023-07-30 DIAGNOSIS — N6002 Solitary cyst of left breast: Secondary | ICD-10-CM

## 2023-07-30 DIAGNOSIS — F331 Major depressive disorder, recurrent, moderate: Secondary | ICD-10-CM | POA: Diagnosis not present

## 2023-07-30 DIAGNOSIS — F411 Generalized anxiety disorder: Secondary | ICD-10-CM

## 2023-07-30 NOTE — Progress Notes (Unsigned)
      [facility] Counselor/Therapist Progress Note  [name], MRN: [id number],    Date: 07/30/2023  Time Spent: ***   Treatment Type: Individual Therapy  Reported Symptoms: ***  Mental Status Exam:  Appearance:   Neat     Behavior:  Appropriate, Sharing, and Motivated  Motor:  Normal  Speech/Language:   Clear and Coherent and Normal Rate  Affect:  Appropriate and Congruent  Mood:  normal  Thought process:  normal  Thought content:    WNL  Sensory/Perceptual disturbances:    WNL  Orientation:  oriented to person, place, time/date, and situation  Attention:  Good  Concentration:  Good  Memory:  WNL  Fund of knowledge:   Good  Insight:    Good  Judgment:   Good  Impulse Control:  Good   Risk Assessment: Danger to Self:  No Self-injurious Behavior: No Danger to Others: No Duty to Warn:no Physical Aggression / Violence:No  Access to Firearms a concern: No  Gang Involvement:No   Subjective: ***   Interventions: Solution-Oriented/Positive Psychology, Humanistic/Existential, and Insight-Oriented  Diagnosis:   ICD-10-CM   1. Major depressive disorder, recurrent episode, moderate (HCC)  F33.1     2. Generalized anxiety disorder  F41.1       Plan: ***  Gaspar Bidding, [facility]

## 2023-08-13 ENCOUNTER — Ambulatory Visit: Payer: BC Managed Care – PPO | Admitting: Professional Counselor

## 2023-08-13 ENCOUNTER — Encounter: Payer: Self-pay | Admitting: Professional Counselor

## 2023-08-13 DIAGNOSIS — F411 Generalized anxiety disorder: Secondary | ICD-10-CM

## 2023-08-13 DIAGNOSIS — F4312 Post-traumatic stress disorder, chronic: Secondary | ICD-10-CM | POA: Diagnosis not present

## 2023-08-13 DIAGNOSIS — F331 Major depressive disorder, recurrent, moderate: Secondary | ICD-10-CM

## 2023-08-13 NOTE — Progress Notes (Unsigned)
      [facility] Counselor/Therapist Progress Note  Patient ID: ARNETT CARDY, MRN: [id number],    Date: 08/13/2023  Time Spent: 11:10a - 12:08p   Treatment Type: Individual Therapy  Reported Symptoms: restlessness, worries, hypervigilance, physiological reactivity, somatic concerns, inteperersonal concerns, parenting concerns   Mental Status Exam:  Appearance:   Neat     Behavior:  Appropriate, Sharing, and Motivated  Motor:  Normal  Speech/Language:   Clear and Coherent and Normal Rate  Affect:  Appropriate and Congruent  Mood:  normal  Thought process:  normal  Thought content:    WNL  Sensory/Perceptual disturbances:    WNL  Orientation:  oriented to person, place, time/date, and situation  Attention:  Good  Concentration:  Good  Memory:  WNL  Fund of knowledge:   Good  Insight:    Good  Judgment:   Good  Impulse Control:  Good   Risk Assessment: Danger to Self:  No Self-injurious Behavior: No Danger to Others: No Duty to Warn:no Physical Aggression / Violence:No  Access to Firearms a concern: No  Gang Involvement:No   Subjective: Pt presented to session processing heightened stress with parenting concerns; she reported son to be suspended and processed her experience of events, impact on her well-being and that of family overall. Pt described exacerbation scenarios that necessitated her being continually hypervigilant and worried; she reported asking for help by both external resources and from husband. Counselor actively listened, affirmed pt asking for help, and helped facilitate insight and strategies for moving forward. Counselor recommended pt look into in-home therapy options; pt voiced helpfulness of suggestion. Counselor provided psychoeducation around toxic stress and pt experience of indigestion; they discussed pt resourcing additional support across spheres of life.   Interventions: Solution-Oriented/Positive Psychology, Humanistic/Existential, and  Insight-Oriented  Diagnosis:   ICD-10-CM   1. Major depressive disorder, recurrent episode, moderate (HCC)  F33.1     2. Generalized anxiety disorder  F41.1     3. Post-traumatic stress disorder, chronic  F43.12       Plan: Pt is scheduled for a follow-up; continue process work and developing coping skills.   Gaspar Bidding, [facility]

## 2023-08-25 ENCOUNTER — Ambulatory Visit: Payer: BC Managed Care – PPO | Admitting: Professional Counselor

## 2023-08-25 ENCOUNTER — Encounter: Payer: Self-pay | Admitting: Professional Counselor

## 2023-08-25 DIAGNOSIS — F411 Generalized anxiety disorder: Secondary | ICD-10-CM

## 2023-08-25 DIAGNOSIS — F331 Major depressive disorder, recurrent, moderate: Secondary | ICD-10-CM | POA: Diagnosis not present

## 2023-08-25 NOTE — Progress Notes (Unsigned)
      [facility] Counselor/Therapist Progress Note  [name], MRN: [id number],    Date: 08/25/2023  Time Spent: 11:07a - 12:08p   Treatment Type: Individual Therapy  Reported Symptoms: worries, stress, restlessness, interpersonal concerns  Mental Status Exam:  Appearance:   Neat     Behavior:  Appropriate, Sharing, and Motivated  Motor:  Normal  Speech/Language:   Clear and Coherent and Normal Rate  Affect:  Appropriate and Congruent  Mood:  normal  Thought process:  normal  Thought content:    WNL  Sensory/Perceptual disturbances:    WNL  Orientation:  Sound  Attention:  Good  Concentration:  Good  Memory:  WNL  Fund of knowledge:   Good  Insight:    Good  Judgment:   Good  Impulse Control:  Good   Risk Assessment: Danger to Self:  No Self-injurious Behavior: No Danger to Others: No Duty to Warn:no Physical Aggression / Violence:No  Access to Firearms a concern: No  Gang Involvement:No   Subjective: Pt voiced significant progress where home and family life circumstances from last session were concerned; she identified advocacy work on family member's half to have been successful, and to be feeling sense of relief and stability as a result. Pt voiced focus for concerns at this time to be related to work. She processed experience of patterns that have begun to feel increasingly unsustainable and discouraging, and she voiced resourcing prayer to live into her truth. Counselor actively listened and afifmed pt, and helped pt facilitate insight around origin of feelings and experiences. Pt reflected on alternate expiences that have shaped her values and her sense of professionalism, leadership, integrity and dignity, and how she intends to proceed in the current circumstance. Pt and counselor discussed pt strategies.    Interventions: Assertiveness/Communication, Solution-Oriented/Positive Psychology, Humanistic/Existential, and  Insight-Oriented  Diagnosis:   ICD-10-CM   1. Major depressive disorder, recurrent episode, moderate (HCC)  F33.1     2. Generalized anxiety disorder  F41.1       Plan: Pt is scheduled for a follow-up; continue process work and developing coping skills.   Gaspar Bidding, [facility]

## 2023-09-01 ENCOUNTER — Ambulatory Visit: Payer: BC Managed Care – PPO | Admitting: Behavioral Health

## 2023-09-03 ENCOUNTER — Ambulatory Visit: Payer: BC Managed Care – PPO | Admitting: Professional Counselor

## 2023-09-04 DIAGNOSIS — H6121 Impacted cerumen, right ear: Secondary | ICD-10-CM | POA: Diagnosis not present

## 2023-09-09 ENCOUNTER — Ambulatory Visit: Payer: BC Managed Care – PPO | Admitting: Professional Counselor

## 2023-09-10 ENCOUNTER — Encounter: Payer: Self-pay | Admitting: Professional Counselor

## 2023-09-10 ENCOUNTER — Ambulatory Visit: Payer: BC Managed Care – PPO | Admitting: Professional Counselor

## 2023-09-10 DIAGNOSIS — F331 Major depressive disorder, recurrent, moderate: Secondary | ICD-10-CM

## 2023-09-10 DIAGNOSIS — F411 Generalized anxiety disorder: Secondary | ICD-10-CM | POA: Diagnosis not present

## 2023-09-10 NOTE — Progress Notes (Signed)
      [facility] Counselor/Therapist Progress Note  [name], MRN: [id number],    Date: 09/10/2023  Time Spent:  11:08a - 11:07a  Treatment Type: Individual Therapy  Reported Symptoms: tearfulness, worries, restlessness, stress, irritability, self esteem concerns, life transition concerns, interpersonal concerns   Mental Status Exam:  Appearance:   Neat     Behavior:  Appropriate, Sharing, and Motivated  Motor:  Normal  Speech/Language:   Clear and Coherent and Normal Rate  Affect:  Appropriate and Congruent  Mood:  normal  Thought process:  normal  Thought content:    WNL  Sensory/Perceptual disturbances:    WNL  Orientation:  Sound  Attention:  Good  Concentration:  Good  Memory:  WNL  Fund of knowledge:   Good  Insight:    Good  Judgment:   Good  Impulse Control:  Good   Risk Assessment: Danger to Self:  No Self-injurious Behavior: No Danger to Others: No Duty to Warn:no Physical Aggression / Violence:No  Access to Firearms a concern: No  Gang Involvement:No   Subjective: Pt presented to session with concerns pertaining to anxiety an depression per symptoms above. She reported a job change at this time, the circumstances of which to be exacerbating stress and yet also promising poitiive outcome. She reported progress per incidences of self advocacy and resulting sense of empowerment, and incidence of being valued by others and resulting affirmation. She processed needs in family sphere, and regarding self care; she reported friendship to be a morale boost at this time. Counselor helped facilitate insight around pt identification of parts per IFS intervention; pt named identification of being a Mia Wilson, and of having different personas such as Mia Wilson, Mia Wilson and and Mia Wilson. She traced the origin of these parts of herself, and which most resonates as authentic to her. She discussed with counselor leaning into her authentic self, one of humor, confidence,  forgiveness, fun and fearlessness. Counselor helped pt identity short term goals of prioritizing self care in the way of daily habits, relationships, church-going, diet and exercise, and later adding aspirational goals of volunteerism.   Interventions: Solution-Oriented/Positive Psychology, Humanistic/Existential, and Insight-Oriented, IFS  Diagnosis:   ICD-10-CM   1. Major depressive disorder, recurrent episode, moderate (HCC)  F33.1     2. Generalized anxiety disorder  F41.1       Plan: Pt is scheduled for a follow-up; continue process work and developing coping skills. Pt to work on short term goals of self care per resourcing (see above) in immediate sphere of self and family.  Mia Wilson, [facility]

## 2023-09-11 DIAGNOSIS — H9203 Otalgia, bilateral: Secondary | ICD-10-CM | POA: Diagnosis not present

## 2023-09-11 DIAGNOSIS — T161XXA Foreign body in right ear, initial encounter: Secondary | ICD-10-CM | POA: Diagnosis not present

## 2023-09-11 DIAGNOSIS — J019 Acute sinusitis, unspecified: Secondary | ICD-10-CM | POA: Diagnosis not present

## 2023-09-11 DIAGNOSIS — Z03818 Encounter for observation for suspected exposure to other biological agents ruled out: Secondary | ICD-10-CM | POA: Diagnosis not present

## 2023-09-11 DIAGNOSIS — J029 Acute pharyngitis, unspecified: Secondary | ICD-10-CM | POA: Diagnosis not present

## 2023-09-11 DIAGNOSIS — W449XXA Unspecified foreign body entering into or through a natural orifice, initial encounter: Secondary | ICD-10-CM | POA: Diagnosis not present

## 2023-09-13 ENCOUNTER — Other Ambulatory Visit: Payer: Self-pay | Admitting: Behavioral Health

## 2023-09-13 DIAGNOSIS — F331 Major depressive disorder, recurrent, moderate: Secondary | ICD-10-CM

## 2023-09-14 NOTE — Telephone Encounter (Signed)
Appt 10/24

## 2023-09-16 ENCOUNTER — Ambulatory Visit (INDEPENDENT_AMBULATORY_CARE_PROVIDER_SITE_OTHER): Payer: Self-pay | Admitting: Behavioral Health

## 2023-09-16 DIAGNOSIS — Z0389 Encounter for observation for other suspected diseases and conditions ruled out: Secondary | ICD-10-CM

## 2023-09-16 NOTE — Progress Notes (Signed)
Pt did not show for scheduled appt and did not provide 24 hour notice as required. Additional fees to be assessed.  

## 2023-09-17 ENCOUNTER — Ambulatory Visit: Payer: BC Managed Care – PPO | Admitting: Professional Counselor

## 2023-09-24 ENCOUNTER — Encounter: Payer: Self-pay | Admitting: Professional Counselor

## 2023-09-24 ENCOUNTER — Ambulatory Visit: Payer: BC Managed Care – PPO | Admitting: Professional Counselor

## 2023-09-24 DIAGNOSIS — F331 Major depressive disorder, recurrent, moderate: Secondary | ICD-10-CM | POA: Diagnosis not present

## 2023-09-24 DIAGNOSIS — S63501A Unspecified sprain of right wrist, initial encounter: Secondary | ICD-10-CM | POA: Diagnosis not present

## 2023-09-24 DIAGNOSIS — F411 Generalized anxiety disorder: Secondary | ICD-10-CM

## 2023-09-24 DIAGNOSIS — M79641 Pain in right hand: Secondary | ICD-10-CM | POA: Diagnosis not present

## 2023-09-24 NOTE — Progress Notes (Signed)
      [facility] Counselor/Therapist Progress Note  [name], MRN: [id number],    Date: 09/24/2023  Time Spent: 11:12a - 12:10p   Treatment Type: Individual Therapy  Reported Symptoms: hypervigilance, acute stress, worries, fears, sadness, family conflict  Mental Status Exam:  Appearance:   Casual     Behavior:  Appropriate, Sharing, and Motivated  Motor:  Normal  Speech/Language:   Clear and Coherent and Normal Rate  Affect:  Appropriate and Congruent  Mood:  anxious  Thought process:  normal  Thought content:    WNL  Sensory/Perceptual disturbances:    WNL  Orientation:  Sound  Attention:  Good  Concentration:  Good  Memory:  WNL  Fund of knowledge:   Good  Insight:    Good  Judgment:   Good  Impulse Control:  Good   Risk Assessment: Danger to Self:  No Self-injurious Behavior: No Danger to Others: No Duty to Warn:no Physical Aggression / Violence:No  Access to Firearms a concern: No  Gang Involvement:No   Subjective: Pt presented to session with report of exacerbated symptoms at this time given family crisis and impact on her well-being. Pt described highly challenging situations and processed how she is coping and responding. Counselor assisted pt with problem solving strategies and provided helpful resources to assist, and encouraged pt to prioritize proactive decisions and sense of safety. Pt processed experience of career changes and reported positive developments; counselor reinforced pt self assertiveness, confidence, motivation, listening to her needs and knowing of her value.  Interventions: Solution-Oriented/Positive Psychology, Humanistic/Existential, and Insight-Oriented  Diagnosis:   ICD-10-CM   1. Generalized anxiety disorder  F41.1     2. Major depressive disorder, recurrent episode, moderate (HCC)  F33.1       Plan: Pt is scheduled for a follow-up; continue process work and developing coping skills. Pt encouraged pt to  resource proactive plan in family circumstance as discussed.  Gaspar Bidding, [facility]

## 2023-10-04 ENCOUNTER — Ambulatory Visit (INDEPENDENT_AMBULATORY_CARE_PROVIDER_SITE_OTHER): Payer: BC Managed Care – PPO | Admitting: Professional Counselor

## 2023-10-04 ENCOUNTER — Encounter: Payer: Self-pay | Admitting: Professional Counselor

## 2023-10-04 DIAGNOSIS — F411 Generalized anxiety disorder: Secondary | ICD-10-CM

## 2023-10-04 DIAGNOSIS — F4312 Post-traumatic stress disorder, chronic: Secondary | ICD-10-CM | POA: Diagnosis not present

## 2023-10-04 DIAGNOSIS — F331 Major depressive disorder, recurrent, moderate: Secondary | ICD-10-CM | POA: Diagnosis not present

## 2023-10-04 NOTE — Progress Notes (Signed)
      [facility] Counselor/Therapist Progress Note  [name], MRN: [id number],    Date: 10/04/2023  Time Spent: 12:15p - 1:14p   Treatment Type: Individual Therapy  Reported Symptoms: stress, worries, restlessness, hypervigilance, grief/loss, interpersonal concerns   Mental Status Exam:  Appearance:   Neat     Behavior:  Appropriate, Sharing, and Motivated  Motor:  Tremor  Speech/Language:   Clear and Coherent and Normal Rate  Affect:  Appropriate and Congruent  Mood:  normal  Thought process:  normal  Thought content:    WNL  Sensory/Perceptual disturbances:    WNL  Orientation:  Sound  Attention:  Good  Concentration:  Good  Memory:  WNL  Fund of knowledge:   Good  Insight:    Good  Judgment:   Good  Impulse Control:  Good   Risk Assessment: Danger to Self:  No Self-injurious Behavior: No Danger to Others: No Duty to Warn:no Physical Aggression / Violence:No  Access to Firearms a concern: No  Gang Involvement:No   Subjective: Pt presented to session to address symptoms of trauma response pattern, anxiety and depression; she reported progress by virtue of medication feeling stabilized at this time, and family circumstances to have improved. Pt identified strong church support helping also. Pt and counselor discussed pt complex response to parenting stress and hypervigilance around her role and issues by hx and at present. She processed losses by hx that involved tragic deaths, and impact on her and family of origin, and also traumas in her role as a parent. She reflected on themes of self failure and forgiveness for others, and counselor actively listened, provided trauma-informed care and psychoeducation, and helped facilitate insight and meaning-making around pt feelings and experiences.   Interventions: Solution-Oriented/Positive Psychology, Humanistic/Existential, and Insight-Oriented  Diagnosis:   ICD-10-CM   1. Generalized anxiety disorder   F41.1     2. Major depressive disorder, recurrent episode, moderate (HCC)  F33.1     3. Post-traumatic stress disorder, chronic  F43.12       Plan: Pt is scheduled for a follow-up; continue process work and developing coping skills. Pt to continue STG of prioritizing self care, implementation of boundaries, resorucing support, and practicing self soothing.   Gaspar Bidding, [facility]

## 2023-10-08 ENCOUNTER — Other Ambulatory Visit: Payer: Self-pay

## 2023-10-08 DIAGNOSIS — E1165 Type 2 diabetes mellitus with hyperglycemia: Secondary | ICD-10-CM

## 2023-10-13 ENCOUNTER — Other Ambulatory Visit (INDEPENDENT_AMBULATORY_CARE_PROVIDER_SITE_OTHER): Payer: BC Managed Care – PPO

## 2023-10-13 DIAGNOSIS — E1165 Type 2 diabetes mellitus with hyperglycemia: Secondary | ICD-10-CM | POA: Diagnosis not present

## 2023-10-13 DIAGNOSIS — Z794 Long term (current) use of insulin: Secondary | ICD-10-CM

## 2023-10-13 LAB — COMPREHENSIVE METABOLIC PANEL
ALT: 22 U/L (ref 0–35)
AST: 25 U/L (ref 0–37)
Albumin: 4.2 g/dL (ref 3.5–5.2)
Alkaline Phosphatase: 101 U/L (ref 39–117)
BUN: 14 mg/dL (ref 6–23)
CO2: 29 meq/L (ref 19–32)
Calcium: 9.1 mg/dL (ref 8.4–10.5)
Chloride: 103 meq/L (ref 96–112)
Creatinine, Ser: 0.76 mg/dL (ref 0.40–1.20)
GFR: 92.07 mL/min (ref >60.00)
Glucose, Bld: 122 mg/dL — ABNORMAL HIGH (ref 70–99)
Potassium: 4 meq/L (ref 3.5–5.1)
Sodium: 138 meq/L (ref 135–145)
Total Bilirubin: 0.8 mg/dL (ref 0.2–1.2)
Total Protein: 7 g/dL (ref 6.0–8.3)

## 2023-10-13 LAB — MICROALBUMIN / CREATININE URINE RATIO
Creatinine,U: 80.8 mg/dL
Microalb Creat Ratio: 0.9 mg/g (ref 0.0–30.0)
Microalb, Ur: <0.7 mg/dL (ref 0.0–1.9)

## 2023-10-13 LAB — HEMOGLOBIN A1C: Hgb A1c MFr Bld: 8.6 % — ABNORMAL HIGH (ref 4.6–6.5)

## 2023-10-13 LAB — LIPID PANEL
Cholesterol: 206 mg/dL — ABNORMAL HIGH (ref 0–200)
HDL: 89.6 mg/dL (ref >39.00)
LDL Cholesterol: 107 mg/dL — ABNORMAL HIGH (ref 0–99)
NonHDL: 116.22
Total CHOL/HDL Ratio: 2
Triglycerides: 44 mg/dL (ref 0.0–149.0)
VLDL: 8.8 mg/dL (ref 0.0–40.0)

## 2023-10-19 ENCOUNTER — Ambulatory Visit: Payer: BC Managed Care – PPO | Admitting: Professional Counselor

## 2023-10-19 ENCOUNTER — Encounter: Payer: Self-pay | Admitting: Professional Counselor

## 2023-10-19 DIAGNOSIS — F411 Generalized anxiety disorder: Secondary | ICD-10-CM | POA: Diagnosis not present

## 2023-10-19 DIAGNOSIS — F331 Major depressive disorder, recurrent, moderate: Secondary | ICD-10-CM
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
## 2023-10-20 ENCOUNTER — Encounter: Payer: Self-pay | Admitting: "Endocrinology

## 2023-10-20 ENCOUNTER — Ambulatory Visit: Payer: BC Managed Care – PPO | Admitting: "Endocrinology

## 2023-10-20 VITALS — BP 120/74 | HR 78 | Ht 65.5 in | Wt 161.8 lb

## 2023-10-20 DIAGNOSIS — E7800 Pure hypercholesterolemia, unspecified: Secondary | ICD-10-CM | POA: Diagnosis not present

## 2023-10-20 DIAGNOSIS — E1165 Type 2 diabetes mellitus with hyperglycemia: Secondary | ICD-10-CM | POA: Diagnosis not present

## 2023-10-20 DIAGNOSIS — Z794 Long term (current) use of insulin: Secondary | ICD-10-CM | POA: Diagnosis not present

## 2023-10-20 DIAGNOSIS — Z9641 Presence of insulin pump (external) (internal): Secondary | ICD-10-CM | POA: Diagnosis not present

## 2023-10-20 MED ORDER — GVOKE HYPOPEN 1-PACK 1 MG/0.2ML ~~LOC~~ SOAJ: 1.0000 mg | SUBCUTANEOUS | 2 refills | Status: AC | PRN

## 2023-10-20 NOTE — Patient Instructions (Signed)

## 2023-10-20 NOTE — Progress Notes (Signed)
Outpatient Endocrinology Note Mia Green, MD  10/20/23   Mia Wilson 08/12/1976 [id number]  Referring Provider: Milus Height, PA Primary Care Provider: Milus Height, PA Reason for consultation: Subjective   Assessment & Plan  Diagnoses and all orders for this visit:  Uncontrolled type 2 diabetes mellitus with hyperglycemia (HCC)  Insulin pump in place  Pure hypercholesterolemia  Other orders -     Glucagon (GVOKE HYPOPEN 1-PACK) 1 MG/0.2ML SOAJ; Inject 1 mg into the skin as needed (low blood sugar with impaired consciousness).   Diabetes Type II complicated by hyperglycemia Lab Results  Component Value Date   GFR 92.07 10/13/2023   Hba1c goal less than 7, current Hba1c is  Lab Results  Component Value Date   HGBA1C 8.6 (H) 10/13/2023   Will recommend the following: On Omnipod 5 insulin pump with DexCom G6 with Lyumjev Changed setting to: BASAL RATES: Midnight = 0.3, 5 AM = 0.45, 9 AM = 0.3 and 7 PM = 0. 45  Bolus settings: I/C ratio 12 (5pm-12pm: 10) ISF: 60, Target: 110, with correction over 140.  Was on Farxiga, pt is not sure why she was taken off   No known contraindications/side effects to any of above medications Glucagon discussed and prescribed with refills on 10/20/23  -Last LD and Tg are as follows: Lab Results  Component Value Date   LDLCALC 107 (H) 10/13/2023    Lab Results  Component Value Date   TRIG 44.0 10/13/2023   -non-complaint with statin -On atorvastatin 10 mg QD -Follow low fat diet and exercise   -Blood pressure goal <140/90 - Microalbumin/creatinine goal is < 30 -Last MA/Cr is as follows: Lab Results  Component Value Date   MICROALBUR <0.7 10/13/2023   -not on ACE/ARB  -diet changes including salt restriction -limit eating outside -counseled BP targets per standards of diabetes care -uncontrolled blood pressure can lead to retinopathy, nephropathy and cardiovascular and atherosclerotic heart  disease  Reviewed and counseled on: -A1C target -Blood sugar targets -Complications of uncontrolled diabetes  -Checking blood sugar before meals and bedtime and bring log next visit -All medications with mechanism of action and side effects -Hypoglycemia management: rule of 15's, Glucagon Emergency Kit and medical alert ID -low-carb low-fat plate-method diet -At least 20 minutes of physical activity per day -Annual dilated retinal eye exam and foot exam -compliance and follow up needs -follow up as scheduled or earlier if problem gets worse  Call if blood sugar is less than 70 or consistently above 250    Take a 15 gm snack of carbohydrate at bedtime before you go to sleep if your blood sugar is less than 100.    If you are going to fast after midnight for a test or procedure, ask your physician for instructions on how to reduce/decrease your insulin dose.    Call if blood sugar is less than 70 or consistently above 250  -Treating a low sugar by rule of 15  (15 gms of sugar every 15 min until sugar is more than 70) If you feel your sugar is low, test your sugar to be sure If your sugar is low (less than 70), then take 15 grams of a fast acting Carbohydrate (3-4 glucose tablets or glucose gel or 4 ounces of juice or regular soda) Recheck your sugar 15 min after treating low to make sure it is more than 70 If sugar is still less than 70, treat again with 15 grams of carbohydrate  Don't drive the hour of hypoglycemia  If unconscious/unable to eat or drink by mouth, use glucagon injection or nasal spray baqsimi and call 911. Can repeat again in 15 min if still unconscious.  Return in about 26 days (around 11/15/2023).   I have reviewed current medications, nurse's notes, allergies, vital signs, past medical and surgical history, family medical history, and social history for this encounter. Counseled patient on symptoms, examination findings, lab findings, imaging results,  treatment decisions and monitoring and prognosis. The patient understood the recommendations and agrees with the treatment plan. All questions regarding treatment plan were fully answered.  Mia [place], MD  10/20/23    History of Present Illness Mia Wilson is a 47 y.o. year old female who presents for evaluation of Type II diabetes mellitus.  Mia Wilson was first diagnosed in 2009.   Diabetes education +  Diagnosis: date of diagnosis: 2009, initially asymptomatic  RECENT history:   Current treatment regimen:  OMNIPOD 5 insulin pump  BASAL RATES: Midnight = 0.3, 5 AM = 0.45, 9 AM = 0.3 and 7 PM = 0. 45  Bolus settings: I/C ratio 12 ISF: 60, Target: 110, with correction over 140.  Insulin: Currently using Lyumjev:  Non-insulin hypoglycemic drugs: Farxiga 10 mg daily in evening  COMPLICATIONS -  MI/Stroke -  retinopathy -  neuropathy -  nephropathy  BLOOD SUGAR DATA  CGM interpretation: At today's visit, we reviewed her CGM downloads. The full report is scanned in the media. Reviewing the CGM trends, BG are elevated after dinner-overnight.    Physical Exam  BP 120/74   Pulse 78   Ht 5' 5.5" (1.664 m)   Wt 161 lb 12.8 oz (73.4 kg)   LMP 11/10/2018 (Approximate)   SpO2 99%   BMI 26.52 kg/m    Constitutional: well developed, well nourished Head: normocephalic, atraumatic Eyes: sclera anicteric, no redness Neck: supple Lungs: normal respiratory effort Neurology: alert and oriented Skin: dry, no appreciable rashes Musculoskeletal: no appreciable defects Psychiatric: normal mood and affect Diabetic Foot Exam - Simple   No data filed      Current Medications Patient's Medications  New Prescriptions   GLUCAGON (GVOKE HYPOPEN 1-PACK) 1 MG/0.2ML SOAJ    Inject 1 mg into the skin as needed (low blood sugar with impaired consciousness).  Previous Medications   ATOMOXETINE (STRATTERA) 80 MG CAPSULE    Take 80 mg by mouth every morning.    ATORVASTATIN (LIPITOR) 10 MG TABLET    Take 10 mg by mouth daily.   BLOOD GLUCOSE MONITORING SUPPL (ONETOUCH VERIO FLEX SYSTEM) W/DEVICE KIT    CHECK BLOOD SUGAR BEFORE AND AFTER EACH MEAL   BUPROPION (WELLBUTRIN XL) 150 MG 24 HR TABLET    Take 300 mg by mouth every morning.   CONTINUOUS BLOOD GLUC SENSOR (DEXCOM G6 SENSOR) MISC    USE AS INSTRUCTED CHANGE EVERY 10 DAYS   CONTINUOUS BLOOD GLUC TRANSMIT (DEXCOM G6 TRANSMITTER) MISC    AS DIRECTED CHANGE EVERY 90 DAYS   DAPAGLIFLOZIN PROPANEDIOL (FARXIGA) 10 MG TABS TABLET    TAKE 1 TABLET BY MOUTH EVERY DAY BEFORE BREAKFAST   DEXTROMETHORPHAN-BUPROPION ER (AUVELITY) 45-105 MG TBCR    Take 2 tablets by mouth daily.   DULOXETINE (CYMBALTA) 60 MG CAPSULE    Take 1 capsule (60 mg total) by mouth daily.   ESTRADIOL (CLIMARA - DOSED IN MG/24 HR) 0.0375 MG/24HR PATCH    Place 0.0375 mg onto the skin once a week.   INSULIN DISPOSABLE  PUMP (OMNIPOD 5 G6 POD, GEN 5,) MISC    1 Device by Does not apply route every 3 (three) days.   INSULIN LISPRO-AABC (LYUMJEV) 100 UNIT/ML SOLN    INJECT 30 UNITS INTO THE SKIN DAILY. USE MAX OF 30 UNITS DAILY VIA INSULIN PUMP   LANCETS (ONETOUCH DELICA PLUS LANCET30G) MISC    1 each by Does not apply route See admin instructions. Use Onetouch Delica Plus lancets to check blood sugar before and after each meal.   MULTIPLE VITAMIN (MULTIVITAMIN WITH MINERALS) TABS    Take 1 tablet by mouth daily.   ONETOUCH VERIO TEST STRIP    USE ONETOUCH VERIO FLEX AS INSTRUCTED TO CHECK BLOOD SUGAR BEFORE AND AFTER EACH MEAL.   PROGESTERONE (PROMETRIUM) 100 MG CAPSULE       VITAMIN B-12 (CYANOCOBALAMIN) 1000 MCG TABLET    Take 1,000 mcg by mouth daily.  Modified Medications   No medications on file  Discontinued Medications   DEXTROMETHORPHAN-BUPROPION ER (AUVELITY) 45-105 MG TBCR    TAKE ONE TABLET BY MOUTH TWICE A DAY, 8 HOURS APART.    Allergies Allergies  Allergen Reactions   Other Anaphylaxis    Peaches , tomatoes, strawberry    Strawberry Extract Hives    Facial hives and mouth hives   Tomato Hives    Facial hives and mouth hives    Past Medical History Past Medical History:  Diagnosis Date   Back pain    Diabetes mellitus    Fibroids    PCOS (polycystic ovarian syndrome)     Past Surgical History Past Surgical History:  Procedure Laterality Date   BREAST EXCISIONAL BIOPSY Right    BREAST SURGERY     benign   IR ANGIOGRAM PELVIS SELECTIVE OR SUPRASELECTIVE  11/17/2018   IR ANGIOGRAM PELVIS SELECTIVE OR SUPRASELECTIVE  11/17/2018   IR ANGIOGRAM SELECTIVE EACH ADDITIONAL VESSEL  11/17/2018   IR ANGIOGRAM SELECTIVE EACH ADDITIONAL VESSEL  11/17/2018   IR EMBO TUMOR ORGAN ISCHEMIA INFARCT INC GUIDE ROADMAPPING  11/17/2018   IR RADIOLOGIST EVAL & MGMT  09/14/2018   IR RADIOLOGIST EVAL & MGMT  12/13/2018   IR RADIOLOGIST EVAL & MGMT  06/28/2019   IR US GUIDE VASC ACCESS RIGHT  11/17/2018   UTERINE FIBROID SURGERY      Family History family history includes Breast cancer in her maternal grandmother; Cancer in an other family member; Diabetes in her maternal grandmother, maternal uncle, and paternal grandmother; Heart disease in her maternal grandmother and paternal uncle; Hyperlipidemia in an other family member; Hypertension in an other family member; Stroke in an other family member.  Social History Social History   Socioeconomic History   Marital status: Married    Spouse name: Not on file   Number of children: Not on file   Years of education: Not on file   Highest education level: Not on file  Occupational History   Not on file  Tobacco Use   Smoking status: Never   Smokeless tobacco: Never  Substance and Sexual Activity   Alcohol use: No   Drug use: No   Sexual activity: Yes  Other Topics Concern   Not on file  Social History Narrative   Living in [place] with husband and two adopted children 15 and 14. Currently on FMLA.  She is not employed at this time. She has been her 2 youngest  adopted children at home. Not involved a lot else here at this time. She has 2 other children also. She  walks a little bit but does not do a lot since she has left her with fatigue.   Social Determinants of Health   Financial Resource Strain: Not on file  Food Insecurity: Not on file  Transportation Needs: Not on file  Physical Activity: Not on file  Stress: Stress Concern Present (05/19/2023)   [employer] of Occupational Health - Occupational Stress Questionnaire    Feeling of Stress : Rather much  Social Connections: Unknown (05/19/2023)   Social Connection and Isolation Panel [NHANES]    Frequency of Communication with Friends and Family: More than three times a week    Frequency of Social Gatherings with Friends and Family: More than three times a week    Attends Religious Services: More than 4 times per year    Active Member of Clubs or Organizations: Not on file    Attends Banker Meetings: Not on file    Marital Status: Married  Intimate Partner Violence: Unknown (02/26/2022)   Received from [facility]   HITS    Physically Hurt: Not on file    Insult or Talk Down To: Not on file    Threaten Physical Harm: Not on file    Scream or Curse: Not on file    Lab Results  Component Value Date   HGBA1C 8.6 (H) 10/13/2023   HGBA1C 7.3 (H) 04/30/2023   HGBA1C 7.4 (H) 12/08/2022   Lab Results  Component Value Date   CHOL 206 (H) 10/13/2023   Lab Results  Component Value Date   HDL 89.60 10/13/2023   Lab Results  Component Value Date   LDLCALC 107 (H) 10/13/2023   Lab Results  Component Value Date   TRIG 44.0 10/13/2023   Lab Results  Component Value Date   CHOLHDL 2 10/13/2023   Lab Results  Component Value Date   CREATININE 0.76 10/13/2023   Lab Results  Component Value Date   GFR 92.07 10/13/2023   Lab Results  Component Value Date   MICROALBUR <0.7 10/13/2023      Component Value Date/Time   NA 138 10/13/2023 0835    K 4.0 10/13/2023 0835   CL 103 10/13/2023 0835   CO2 29 10/13/2023 0835   GLUCOSE 122 (H) 10/13/2023 0835   BUN 14 10/13/2023 0835   CREATININE 0.76 10/13/2023 0835   CREATININE 0.67 04/30/2012 1319   CALCIUM 9.1 10/13/2023 0835   PROT 7.0 10/13/2023 0835   ALBUMIN 4.2 10/13/2023 0835   AST 25 10/13/2023 0835   ALT 22 10/13/2023 0835   ALKPHOS 101 10/13/2023 0835   BILITOT 0.8 10/13/2023 0835   GFRNONAA >60 09/25/2020 1402   GFRAA >60 11/17/2018 0758      Latest Ref Rng & Units 10/13/2023    8:35 AM 04/30/2023    9:18 AM 12/08/2022    1:36 PM  BMP  Glucose 70 - 99 mg/dL 829  97  562   BUN 6 - 23 mg/dL 14  14  11    Creatinine 0.40 - 1.20 mg/dL 1.30  8.65  7.84   Sodium 135 - 145 mEq/L 138  138  138   Potassium 3.5 - 5.1 mEq/L 4.0  4.4  4.5   Chloride 96 - 112 mEq/L 103  102  99   CO2 19 - 32 mEq/L 29  27  27    Calcium 8.4 - 10.5 mg/dL 9.1  9.2  69.6        Component Value Date/Time   WBC 9.7  09/25/2020 1402   RBC 5.45 (H) 09/25/2020 1402   HGB 14.5 09/25/2020 1402   HCT 49.2 (H) 09/25/2020 1402   PLT 259 09/25/2020 1402   MCV 90.3 09/25/2020 1402   MCV 78.0 (A) 04/30/2012 1351   MCH 26.6 09/25/2020 1402   MCHC 29.5 (L) 09/25/2020 1402   RDW 14.6 09/25/2020 1402   LYMPHSABS 1.4 11/17/2018 0758   MONOABS 0.6 11/17/2018 0758   EOSABS 0.1 11/17/2018 0758   BASOSABS 0.0 11/17/2018 0758     Parts of this note may have been dictated using voice recognition software. There may be variances in spelling and vocabulary which are unintentional. Not all errors are proofread. Please notify the Thereasa Parkin if any discrepancies are noted or if the meaning of any statement is not clear.

## 2023-10-22 DIAGNOSIS — H524 Presbyopia: Secondary | ICD-10-CM | POA: Diagnosis not present

## 2023-10-22 DIAGNOSIS — H5213 Myopia, bilateral: Secondary | ICD-10-CM | POA: Diagnosis not present

## 2023-10-22 DIAGNOSIS — E119 Type 2 diabetes mellitus without complications: Secondary | ICD-10-CM | POA: Diagnosis not present

## 2023-11-05 ENCOUNTER — Ambulatory Visit: Payer: BC Managed Care – PPO | Admitting: Professional Counselor

## 2023-11-08 ENCOUNTER — Other Ambulatory Visit: Payer: Self-pay | Admitting: "Endocrinology

## 2023-11-08 ENCOUNTER — Other Ambulatory Visit: Payer: Self-pay | Admitting: Behavioral Health

## 2023-11-08 ENCOUNTER — Telehealth: Payer: Self-pay

## 2023-11-08 DIAGNOSIS — E1165 Type 2 diabetes mellitus with hyperglycemia: Secondary | ICD-10-CM

## 2023-11-08 MED ORDER — DEXCOM G6 SENSOR MISC: 3 refills | Status: DC

## 2023-11-08 MED ORDER — OMNIPOD 5 DEXG7G6 PODS GEN 5 MISC: 1.0000 | 2 refills | Status: DC

## 2023-11-08 MED ORDER — DEXCOM G6 TRANSMITTER MISC: 3 refills | Status: DC

## 2023-11-08 NOTE — Telephone Encounter (Signed)
Requested Prescriptions   Signed Prescriptions Disp Refills   Insulin Disposable Pump (OMNIPOD 5 DEXG7G6 PODS GEN 5) MISC 30 each 2    Sig: 1 Device by Does not apply route every 3 (three) days.    Authorizing Provider: Altamese [place]    Ordering User: Valorie Roosevelt S   Continuous Glucose Transmitter (DEXCOM G6 TRANSMITTER) MISC 1 each 3    Sig: AS DIRECTED CHANGE EVERY 90 DAYS    Authorizing Provider: Altamese [place]    Ordering User: Hyacinth Meeker, Jazia Faraci S   Continuous Glucose Sensor (DEXCOM G6 SENSOR) MISC 9 each 3    Sig: USE AS INSTRUCTED CHANGE EVERY 10 DAYS    Authorizing Provider: Altamese [place]    Ordering User: Pollie Meyer

## 2023-11-11 ENCOUNTER — Encounter (HOSPITAL_COMMUNITY): Payer: Self-pay

## 2023-11-11 ENCOUNTER — Emergency Department (HOSPITAL_COMMUNITY): Payer: BC Managed Care – PPO

## 2023-11-11 ENCOUNTER — Telehealth: Payer: Self-pay | Admitting: "Endocrinology

## 2023-11-11 ENCOUNTER — Other Ambulatory Visit: Payer: Self-pay

## 2023-11-11 ENCOUNTER — Emergency Department (HOSPITAL_COMMUNITY)
Admission: EM | Admit: 2023-11-11 | Discharge: 2023-11-12 | Disposition: A | Discharge status: Home / Self Care | Payer: BC Managed Care – PPO | Attending: Emergency Medicine | Admitting: Emergency Medicine

## 2023-11-11 DIAGNOSIS — D219 Benign neoplasm of connective and other soft tissue, unspecified: Secondary | ICD-10-CM

## 2023-11-11 DIAGNOSIS — D259 Leiomyoma of uterus, unspecified: Secondary | ICD-10-CM | POA: Insufficient documentation

## 2023-11-11 DIAGNOSIS — K5900 Constipation, unspecified: Secondary | ICD-10-CM | POA: Insufficient documentation

## 2023-11-11 DIAGNOSIS — R1084 Generalized abdominal pain: Secondary | ICD-10-CM

## 2023-11-11 DIAGNOSIS — E1165 Type 2 diabetes mellitus with hyperglycemia: Secondary | ICD-10-CM

## 2023-11-11 DIAGNOSIS — K760 Fatty (change of) liver, not elsewhere classified: Secondary | ICD-10-CM | POA: Diagnosis not present

## 2023-11-11 DIAGNOSIS — R1031 Right lower quadrant pain: Secondary | ICD-10-CM | POA: Diagnosis not present

## 2023-11-11 DIAGNOSIS — R109 Unspecified abdominal pain: Secondary | ICD-10-CM | POA: Diagnosis not present

## 2023-11-11 LAB — CBC WITH DIFFERENTIAL/PLATELET
Abs Immature Granulocytes: 0.01 10*3/uL (ref 0.00–0.07)
Basophils Absolute: 0 10*3/uL (ref 0.0–0.1)
Basophils Relative: 0 %
Eosinophils Absolute: 0 10*3/uL (ref 0.0–0.5)
Eosinophils Relative: 1 %
HCT: 38.4 % (ref 36.0–46.0)
Hemoglobin: 11.8 g/dL — ABNORMAL LOW (ref 12.0–15.0)
Immature Granulocytes: 0 %
Lymphocytes Relative: 45 %
Lymphs Abs: 2 10*3/uL (ref 0.7–4.0)
MCH: 26.5 pg (ref 26.0–34.0)
MCHC: 30.7 g/dL (ref 30.0–36.0)
MCV: 86.1 fL (ref 80.0–100.0)
Monocytes Absolute: 0.5 10*3/uL (ref 0.1–1.0)
Monocytes Relative: 11 %
Neutro Abs: 1.9 10*3/uL (ref 1.7–7.7)
Neutrophils Relative %: 43 %
Platelets: 285 10*3/uL (ref 150–400)
RBC: 4.46 MIL/uL (ref 3.87–5.11)
RDW: 13.5 % (ref 11.5–15.5)
WBC: 4.5 10*3/uL (ref 4.0–10.5)
nRBC: 0 % (ref 0.0–0.2)

## 2023-11-11 LAB — URINALYSIS, W/ REFLEX TO CULTURE (INFECTION SUSPECTED)
Bacteria, UA: NONE SEEN
Bilirubin Urine: NEGATIVE
Glucose, UA: >=500 mg/dL — AB
Hgb urine dipstick: NEGATIVE
Ketones, ur: NEGATIVE mg/dL
Leukocytes,Ua: NEGATIVE
Nitrite: NEGATIVE
Protein, ur: NEGATIVE mg/dL
Specific Gravity, Urine: 1.027 (ref 1.005–1.030)
pH: 6 (ref 5.0–8.0)

## 2023-11-11 LAB — COMPREHENSIVE METABOLIC PANEL
ALT: 22 U/L (ref 0–44)
AST: 27 U/L (ref 15–41)
Albumin: 4 g/dL (ref 3.5–5.0)
Alkaline Phosphatase: 93 U/L (ref 38–126)
Anion gap: 8 (ref 5–15)
BUN: 15 mg/dL (ref 6–20)
CO2: 28 mmol/L (ref 22–32)
Calcium: 9.5 mg/dL (ref 8.9–10.3)
Chloride: 102 mmol/L (ref 98–111)
Creatinine, Ser: 0.84 mg/dL (ref 0.44–1.00)
GFR, Estimated: >60 mL/min (ref >60)
Glucose, Bld: 426 mg/dL — ABNORMAL HIGH (ref 70–99)
Potassium: 4 mmol/L (ref 3.5–5.1)
Sodium: 138 mmol/L (ref 135–145)
Total Bilirubin: 0.9 mg/dL (ref <1.2)
Total Protein: 6.9 g/dL (ref 6.5–8.1)

## 2023-11-11 LAB — LIPASE, BLOOD: Lipase: 27 U/L (ref 11–51)

## 2023-11-11 MED ORDER — IOHEXOL 300 MG/ML  SOLN
100.0000 mL | Freq: Once | INTRAMUSCULAR | Status: AC | PRN
  Administered 2023-11-11: 100 mL via INTRAVENOUS

## 2023-11-11 MED ORDER — OMNIPOD 5 G7 PODS (GEN 5) MISC: 1.0000 | 2 refills | Status: DC

## 2023-11-11 NOTE — Telephone Encounter (Signed)
Requested Prescriptions   Signed Prescriptions Disp Refills   Insulin Disposable Pump (OMNIPOD 5 G7 PODS, GEN 5,) MISC 30 each 2    Sig: Inject 1 Device into the skin every 3 (three) days. 1 DEVICE BY DOES NOT APPLY ROUTE EVERY 3 (THREE) DAYS.    Authorizing Provider: Altamese [place]    Ordering User: Pollie Meyer

## 2023-11-11 NOTE — Addendum Note (Signed)
Addended by: Pollie Meyer on: 11/11/2023 09:33 AM   Modules accepted: Orders

## 2023-11-11 NOTE — Telephone Encounter (Signed)
Requested Prescriptions   Signed Prescriptions Disp Refills   Insulin Disposable Pump (OMNIPOD 5 G7 PODS, GEN 5,) MISC 30 each 2    Sig: Inject 1 Device into the skin every 3 (three) days. 1 DEVICE BY DOES NOT APPLY ROUTE EVERY 3 (THREE) DAYS.    Authorizing Provider: Carlus Pavlov    Ordering User: Pollie Meyer    Rx has been sent to [facility]

## 2023-11-11 NOTE — ED Notes (Signed)
Pt stated her cbg was almost 400 and was wondering if it was ok to administer her self a insulin shot. RN made aware, tech was informed to tell pt if she has insulin with her its ok to give herself the shot.

## 2023-11-11 NOTE — ED Provider Triage Note (Signed)
Emergency Medicine Provider Triage Evaluation Note  Mia Wilson , a 47 y.o. female  was evaluated in triage.  Pt complains of abd pain. Fall 1 week ago. Pain diffuse to abd. Seen at [facility], there was concern for internal bleeding as she was having pain since then. No thinners. No UTI sx. UC was also concerned for appendicitis as there was pain to RLQ then palpating the left. Took pepto for abd pain and then noticed dark stools.  Review of Systems  Positive: Abd pain Negative: HA, CP, SOB, Uti sx  Physical Exam  BP 108/85   Pulse 77   Temp 98.4 F (36.9 C) (Oral)   Resp 18   Ht 5' 5.5" (1.664 m)   Wt 74 kg   LMP 11/10/2018 (Approximate)   SpO2 96%   BMI 26.74 kg/m  Gen:   Awake, no distress   Resp:  Normal effort  MSK:   Moves extremities without difficulty  Other:    Medical Decision Making  Medically screening exam initiated at 6:46 PM.  Appropriate orders placed.  Caridad YASAMEEN FAUR was informed that the remainder of the evaluation will be completed by another provider, this initial triage assessment does not replace that evaluation, and the importance of remaining in the ED until their evaluation is complete.  Abd pain   Kumar Falwell A, PA-C 11/11/23 1847

## 2023-11-11 NOTE — ED Triage Notes (Signed)
Patient has had abdominal cramping and pain for 1 week. Pain began after a fall a week ago, no thinners. Pain moves to right lower quadrant. Feels pressure around her bladder.

## 2023-11-11 NOTE — Telephone Encounter (Signed)
Please send new Rx for Omni pod G6 Pump to [facility] to ship [phone]. Patient advised her insurance states the Rx needs to be sent thru this pharmacy. Please send ASAP. Patient has no more pumps available. Please call when done.

## 2023-11-12 ENCOUNTER — Other Ambulatory Visit: Payer: Self-pay

## 2023-11-12 ENCOUNTER — Telehealth: Payer: Self-pay

## 2023-11-12 MED ORDER — POLYETHYLENE GLYCOL 3350 17 GM/SCOOP PO POWD: 17.0000 g | Freq: Two times a day (BID) | ORAL | 0 refills | Status: DC

## 2023-11-12 MED ORDER — DOCUSATE SODIUM 100 MG PO CAPS: 100.0000 mg | ORAL_CAPSULE | Freq: Two times a day (BID) | ORAL | 0 refills | Status: DC

## 2023-11-12 NOTE — ED Provider Notes (Signed)
[facility]-EM[facility]GENCY DEPT [facility] Emergency Department Provider Note MRN:  [id number]  Arrival date & time: 11/12/23     Chief Complaint   Abdominal Pain   History of Present Illness   Mia Wilson is a 47 y.o. year-old female presents to the ED with chief complaint of abdominal cramps.  She has been having the symptoms for the past few days. She states that she had a fall about a week ago and was seen in an urgent care today, who referred her to the ER to rule out any intraabdominal trauma.  She states that she had some upset stomach and took some pepto.  She states that she had some darks stools after the pepto.  She denies fever.  Denies dysuria.  Denies any other associated symptoms.  She states that her blood sugar has been a little high this week because she is getting set up with a new insulin pump.  History provided by patient.   Review of Systems  Pertinent positive and negative review of systems noted in HPI.    Physical Exam   Vitals:   11/11/23 1843 11/11/23 2150  BP: 108/85 126/72  Pulse:  70  Resp:  16  Temp:  98.4 F (36.9 C)  SpO2:  97%    CONSTITUTIONAL:  well-appearing, NAD NEURO:  Alert and oriented x 3, CN 3-12 grossly intact EYES:  eyes equal and reactive ENT/NECK:  Supple, no stridor  CARDIO:  normal rate, regular rhythm, appears well-perfused  PULM:  No respiratory distress, CTAB GI/GU:  non-distended, no focal abdominal tenderness MSK/SPINE:  No gross deformities, no edema, moves all extremities  SKIN:  no rash, atraumatic   *Additional and/or pertinent findings included in MDM below  Diagnostic and Interventional Summary    EKG Interpretation Date/Time:    Ventricular Rate:    PR Interval:    QRS Duration:    QT Interval:    QTC Calculation:   R Axis:      Text Interpretation:         Labs Reviewed  CBC WITH DIFFERENTIAL/PLATELET - Abnormal; Notable for the following components:      Result Value   Hemoglobin 11.8 (*)     All other components within normal limits  COMPREHENSIVE METABOLIC PANEL - Abnormal; Notable for the following components:   Glucose, Bld 426 (*)    All other components within normal limits  URINALYSIS, W/ REFLEX TO CULTURE (INFECTION SUSPECTED) - Abnormal; Notable for the following components:   Glucose, UA >=500 (*)    All other components within normal limits  LIPASE, BLOOD    CT ABDOMEN PELVIS W CONTRAST  Final Result      Medications  iohexol (OMNIPAQUE) 300 MG/ML solution 100 mL (100 mLs Intravenous Contrast Given 11/11/23 2111)     Procedures  /  Critical Care Procedures  ED Course and Medical Decision Making  I have reviewed the triage vital signs, the nursing notes, and pertinent available records from the EMR.  Social Determinants Affecting Complexity of Care: Patient has no clinically significant social determinants affecting this chief complaint..   ED Course: Clinical Course as of 11/12/23 0052  Fri Nov 12, 2023  0049 Comprehensive metabolic panel(!) Elevated glucose, but normal bicarb and normal anion gap. [RB]  0049 CBC with Differential(!) No leukocytosis, no profound anemia [RB]  0050 CT shows no acute process.  There is moderate stool burden and uterine fibroids, both of which could be the cause of the patient's symptoms.  Will trial treatment with miralax and colace.  Return precautions discussed. [RB]    Clinical Course User Index [RB] Roxy Horseman, PA-C    Medical Decision Making Problems Addressed: Constipation, unspecified constipation type: acute illness or injury Fibroids: chronic illness or injury    Details: Will have patient follow-up.  Has hx of fibroids.  Generalized abdominal pain: acute illness or injury    Details: Thought 2/2 constipation.  Will treat with miralax and colace.  CT negative for appy or other emergent process.  Amount and/or Complexity of Data Reviewed Labs:  Decision-making details documented in ED  Course.  Risk OTC drugs.         Consultants: No consultations were needed in caring for this patient.   Treatment and Plan: I considered admission due to patient's initial presentation, but after considering the examination and diagnostic results, patient will not require admission and can be discharged with outpatient follow-up.    Final Clinical Impressions(s) / ED Diagnoses     ICD-10-CM   1. Generalized abdominal pain  R10.84     2. Constipation, unspecified constipation type  K59.00     3. Fibroids  D21.9       ED Discharge Orders          Ordered    polyethylene glycol powder (GLYCOLAX/MIRALAX) 17 GM/SCOOP powder  2 times daily        11/12/23 0051    docusate sodium (COLACE) 100 MG capsule  Every 12 hours        11/12/23 0051              Discharge Instructions Discussed with and Provided to Patient:   Discharge Instructions   None      Roxy Horseman, PA-C 11/12/23 0053    Shon Baton, MD 11/12/23 [phone]

## 2023-11-12 NOTE — Telephone Encounter (Signed)
complete

## 2023-11-14 NOTE — Telephone Encounter (Signed)
Patient is in the need of a PA for pods

## 2023-11-15 ENCOUNTER — Other Ambulatory Visit: Payer: Self-pay

## 2023-11-15 ENCOUNTER — Encounter (HOSPITAL_COMMUNITY): Payer: Self-pay

## 2023-11-15 ENCOUNTER — Telehealth: Payer: Self-pay | Admitting: "Endocrinology

## 2023-11-15 ENCOUNTER — Emergency Department (HOSPITAL_COMMUNITY)
Admission: EM | Admit: 2023-11-15 | Discharge: 2023-11-15 | Disposition: A | Discharge status: Home / Self Care | Payer: BC Managed Care – PPO | Attending: Emergency Medicine | Admitting: Emergency Medicine

## 2023-11-15 ENCOUNTER — Telehealth: Payer: Self-pay | Admitting: Pharmacy Technician

## 2023-11-15 ENCOUNTER — Other Ambulatory Visit (HOSPITAL_COMMUNITY): Payer: Self-pay

## 2023-11-15 ENCOUNTER — Emergency Department (HOSPITAL_COMMUNITY): Payer: BC Managed Care – PPO

## 2023-11-15 DIAGNOSIS — R1110 Vomiting, unspecified: Secondary | ICD-10-CM | POA: Diagnosis not present

## 2023-11-15 DIAGNOSIS — R531 Weakness: Secondary | ICD-10-CM | POA: Diagnosis not present

## 2023-11-15 DIAGNOSIS — R55 Syncope and collapse: Secondary | ICD-10-CM | POA: Insufficient documentation

## 2023-11-15 DIAGNOSIS — R1111 Vomiting without nausea: Secondary | ICD-10-CM

## 2023-11-15 DIAGNOSIS — E109 Type 1 diabetes mellitus without complications: Secondary | ICD-10-CM | POA: Insufficient documentation

## 2023-11-15 DIAGNOSIS — R1084 Generalized abdominal pain: Secondary | ICD-10-CM | POA: Insufficient documentation

## 2023-11-15 DIAGNOSIS — R112 Nausea with vomiting, unspecified: Secondary | ICD-10-CM | POA: Diagnosis not present

## 2023-11-15 LAB — CBC WITH DIFFERENTIAL/PLATELET
Abs Immature Granulocytes: 0.02 10*3/uL (ref 0.00–0.07)
Basophils Absolute: 0 10*3/uL (ref 0.0–0.1)
Basophils Relative: 0 %
Eosinophils Absolute: 0 10*3/uL (ref 0.0–0.5)
Eosinophils Relative: 0 %
HCT: 37.5 % (ref 36.0–46.0)
Hemoglobin: 11.8 g/dL — ABNORMAL LOW (ref 12.0–15.0)
Immature Granulocytes: 0 %
Lymphocytes Relative: 16 %
Lymphs Abs: 1.1 10*3/uL (ref 0.7–4.0)
MCH: 26.3 pg (ref 26.0–34.0)
MCHC: 31.5 g/dL (ref 30.0–36.0)
MCV: 83.7 fL (ref 80.0–100.0)
Monocytes Absolute: 0.6 10*3/uL (ref 0.1–1.0)
Monocytes Relative: 9 %
Neutro Abs: 5.2 10*3/uL (ref 1.7–7.7)
Neutrophils Relative %: 75 %
Platelets: 259 10*3/uL (ref 150–400)
RBC: 4.48 MIL/uL (ref 3.87–5.11)
RDW: 13.5 % (ref 11.5–15.5)
WBC: 7 10*3/uL (ref 4.0–10.5)
nRBC: 0 % (ref 0.0–0.2)

## 2023-11-15 LAB — COMPREHENSIVE METABOLIC PANEL
ALT: 17 U/L (ref 0–44)
AST: 29 U/L (ref 15–41)
Albumin: 3.6 g/dL (ref 3.5–5.0)
Alkaline Phosphatase: 89 U/L (ref 38–126)
Anion gap: 9 (ref 5–15)
BUN: 11 mg/dL (ref 6–20)
CO2: 22 mmol/L (ref 22–32)
Calcium: 8.8 mg/dL — ABNORMAL LOW (ref 8.9–10.3)
Chloride: 103 mmol/L (ref 98–111)
Creatinine, Ser: 0.79 mg/dL (ref 0.44–1.00)
GFR, Estimated: >60 mL/min (ref >60)
Glucose, Bld: 249 mg/dL — ABNORMAL HIGH (ref 70–99)
Potassium: 3.9 mmol/L (ref 3.5–5.1)
Sodium: 134 mmol/L — ABNORMAL LOW (ref 135–145)
Total Bilirubin: 0.6 mg/dL (ref <1.2)
Total Protein: 6.4 g/dL — ABNORMAL LOW (ref 6.5–8.1)

## 2023-11-15 LAB — LIPASE, BLOOD: Lipase: 25 U/L (ref 11–51)

## 2023-11-15 LAB — CBG MONITORING, ED: Glucose-Capillary: 220 mg/dL — ABNORMAL HIGH (ref 70–99)

## 2023-11-15 NOTE — Discharge Instructions (Addendum)
You have been seen here in the emergency department for passing out, we do not think anything scary is going on at this point. We have obtained a full history, performed a physical exam, in addition to other diagnostic tests and treatments. Right now, we feel that you are safe for discharge from a medical perspective, and do not have an acute life threatening illness.   To do: 1.) Take all medications as prescribed.   2.) If anything changes, or you develop fevers, chills, inability to eat or drink, severe pain, new symptoms, return of symptoms, worsening of symptoms, or any other concerns, please call 911 or come back to the emergency department as soon as possible.   3.) Please make an appointment with your primary care doctor for a follow-up visit after being seen here in the emergency department. If you do not have a primary care doctor, you can call [phone] for assistance in finding one or your health care insurance company.    4 please call endocrinology to discuss her diabetes management  Thank you for allowing me to take care of you today. We hope that you feel better soon.

## 2023-11-15 NOTE — Telephone Encounter (Signed)
Pharmacy Patient Advocate Encounter   Received notification from Pt Calls Messages that prior authorization for Omnipod 5 G7 pods is required/requested.   Insurance verification completed.   The patient is insured through [employer] .   Per test claim: PA required; PA submitted to above mentioned insurance via CoverMyMeds Key/confirmation #/EOC [id number] Status is pending

## 2023-11-15 NOTE — Telephone Encounter (Signed)
Spoke with pt and got her pharmacy info. Will Process PA. Also transferred pt to leave a msg for clinical staff. She is currently in the emergency room due to her BS issues while not having her omnipod. Please call her back to advise how she should manage until she can get her Pods.

## 2023-11-15 NOTE — ED Triage Notes (Signed)
Pt BIB [facility] from [place] after severe vomiting. Per pt she has hx of type 1 diabetes and was recently diagnosed with fatty liver, constipation, and fibroids during last visit to ED on 11/11/23. Pt LOC at restaurant for 5-10 mins per husband. Pt complaining of 4/10 abdominal cramping pain. 4mg  Zofran and NS given by EMS. Per EMS, BP 108/68, HR 60, 99% O2 on RA, CBG 101.

## 2023-11-15 NOTE — Telephone Encounter (Signed)
Called & left a msg for the pt. She can call me back with the info or send it through My chart.

## 2023-11-15 NOTE — Telephone Encounter (Signed)
Patient is currently in ED due to DKA and has not been able to get her Onmipod due to PA. Patient states she is being told by ED physician to contact our office to get another plan to control BGL. Please Call ASAP

## 2023-11-15 NOTE — ED Provider Notes (Signed)
[place] EMERGENCY DEPARTMENT AT [facility] Provider Note  HPI   Mia Wilson is a 47 y.o. female patient with a PMHx of type 1 diabetes who is here today with concern for syncope.  Patient states over the past 2 weeks, she has had a generalized abdominal cramping, that is intermittent in nature.  Does not localize to specific part of the abdomen.  She has had no fevers chills urinary symptoms, but has had some mild constipation associated with it.  Of note, the patient for the past couple weeks, has not had her insulin pump, as she is having insurance issues.  She has just been doing short acting lispro, and is carb counting.  She was at a [employer] today, ate a full meal, and prior to the meal, givers of 10 units prior.  After her meal, she had a syncopal episode, family stating that she was out for about 5 minutes, eyes closed, unresponsive, however she was breathing.  She sustained no trauma during this incident.  She had a full return to consciousness almost immediately.  They did put sugar in her mouth.  However her sugar was checked during this incident with her Dexcom, and was in the 130s  She was brought in for evaluation.  ROS Negative except as per HPI   Medical Decision Making   Upon presentation, the patient is afebrile hemodynamic stable overall appears well completely neurovascular intact  For this patient, she was also seen a few days ago, for cramping abdominal pain that has been going on for 2 weeks, had a CT abdomen pelvis that showed uterine fibroids, mild hepatic steatosis, and some constipation which she does endorse clinically.  There is no acute finding of her abdominal cramping and her urine was also negative and she has not had any urinary symptoms.  I do not think we need to repeat a CT abdomen pelvis, I considered a vasovagal in response to some type of pain or other stimulation although she does not endorse any pain.  Do not think this a  pulmonary embolism she has had no hemoptysis no tachypnea no shortness of breath no chest pain and has never had a blood clot before.  We obtained some basic labs on this individual most importantly glucose check which was 220, we are going to rule out DKA, get an EKG chest x-ray metabolic panel CBC and lipase as well.  I do not think she requires any head imaging with no focal neurodeficit, no recent headaches.  Of note EMS did give the patient 4 mg of Zofran and 500 cc of NS.  She has been hemodynamically stable. Of note I do not think this was seizure there was no seizure activity reported, and she has never had any seizure before no headaches.  Immediate return to consciousness   Take home I have reviewed her labs, she has a hemoglobin of 11.8, lipase of 25, her glucose is 249, which is elevated but there is no evidence of anion gap no evidence of DKA.  Chest x-ray clear, EKG shows sinus rhythm  I stated above, I think this is most likely a vasovagal episode she did have some vomiting, and I think this is probably a isolated gastritis, caused a vasovagal syncope.  I see no warning signs in her presentation, and I think that she is safe for discharge.  While here, she was now approved for the insulin pump, so that will be coming in a couple of days.  In the meantime she is going to follow-up with her endocrinologist, discuss her diabetes management in the interim for the next couple of days as she just has the short acting insulin and no pump.  She does have a Dexcom which is working at this time and she reviewed it, there is no hypoglycemic episode at all.    At this time, I feel that the patient is medically cleared for discharge and have discussed this with my attending who agrees.  I discussed with the patient and/or family my overall assessment, including my physical exam, labs, imaging, other diagnostic tests, and therapeutics given.  All questions answered and understanding is expressed.  I have  instructed to call PCP to establish an outpatient appointment after this ED visit, and necessary specialty follow up if needed. I gave strict return precautions to come back to the ED including fevers, chills, severe pain, worsening of symptoms, return of symptoms, new and concerning symptoms, inability to tolerate p.o. intake, among others. I specifically stated to return if symptoms worsen or return   1. Vomiting without nausea, unspecified vomiting type     @DISPOSITION @  Rx / DC Orders ED Discharge Orders     None        Past Medical History:  Diagnosis Date   Back pain    Diabetes mellitus    Fibroids    PCOS (polycystic ovarian syndrome)    Past Surgical History:  Procedure Laterality Date   BREAST EXCISIONAL BIOPSY Right    BREAST SURGERY     benign   IR ANGIOGRAM PELVIS SELECTIVE OR SUPRASELECTIVE  11/17/2018   IR ANGIOGRAM PELVIS SELECTIVE OR SUPRASELECTIVE  11/17/2018   IR ANGIOGRAM SELECTIVE EACH ADDITIONAL VESSEL  11/17/2018   IR ANGIOGRAM SELECTIVE EACH ADDITIONAL VESSEL  11/17/2018   IR EMBO TUMOR ORGAN ISCHEMIA INFARCT INC GUIDE ROADMAPPING  11/17/2018   IR RADIOLOGIST EVAL & MGMT  09/14/2018   IR RADIOLOGIST EVAL & MGMT  12/13/2018   IR RADIOLOGIST EVAL & MGMT  06/28/2019   IR US GUIDE VASC ACCESS RIGHT  11/17/2018   UTERINE FIBROID SURGERY     Family History  Problem Relation Age of Onset   Hyperlipidemia Other    Hypertension Other    Stroke Other    Cancer Other    Diabetes Maternal Uncle    Heart disease Paternal Uncle    Diabetes Maternal Grandmother    Heart disease Maternal Grandmother    Breast cancer Maternal Grandmother        diagnosed in her 45's   Diabetes Paternal Grandmother    Social History   Socioeconomic History   Marital status: Married    Spouse name: Not on file   Number of children: Not on file   Years of education: Not on file   Highest education level: Not on file  Occupational History   Not on file  Tobacco Use    Smoking status: Never   Smokeless tobacco: Never  Substance and Sexual Activity   Alcohol use: No   Drug use: No   Sexual activity: Yes  Other Topics Concern   Not on file  Social History Narrative   Living in [place] with husband and two adopted children 15 and 14. Currently on FMLA.  She is not employed at this time. She has been her 2 youngest adopted children at home. Not involved a lot else here at this time. She has 2 other children also. She walks a little bit but does  not do a lot since she has left her with fatigue.   Social Drivers of Corporate investment banker Strain: Not on file  Food Insecurity: Not on file  Transportation Needs: Not on file  Physical Activity: Not on file  Stress: Stress Concern Present (05/19/2023)   [employer] of Occupational Health - Occupational Stress Questionnaire    Feeling of Stress : Rather much  Social Connections: Unknown (05/19/2023)   Social Connection and Isolation Panel [NHANES]    Frequency of Communication with Friends and Family: More than three times a week    Frequency of Social Gatherings with Friends and Family: More than three times a week    Attends Religious Services: More than 4 times per year    Active Member of [employer] or Organizations: Not on file    Attends Banker Meetings: Not on file    Marital Status: Married  Intimate Partner Violence: Unknown (02/26/2022)   Received from [employer], [facility]   HITS    Physically Hurt: Not on file    Insult or Talk Down To: Not on file    Threaten Physical Harm: Not on file    Scream or Curse: Not on file     Physical Exam   Vitals:   11/15/23 1516  BP: 124/85  Pulse: 74  Resp: 16  Temp: 98 F (36.7 C)  TempSrc: Temporal  SpO2: 99%  Weight: 74 kg  Height: 5\' 5"  (1.651 m)    Physical Exam Vitals and nursing note reviewed.  Constitutional:      General: She is not in acute distress.    Appearance: She is well-developed.  HENT:     Head:  Normocephalic and atraumatic.  Eyes:     Conjunctiva/sclera: Conjunctivae normal.  Cardiovascular:     Rate and Rhythm: Normal rate and regular rhythm.     Heart sounds: No murmur heard. Pulmonary:     Effort: Pulmonary effort is normal. No respiratory distress.     Breath sounds: Normal breath sounds.  Abdominal:     Palpations: Abdomen is soft.     Tenderness: There is no abdominal tenderness.  Musculoskeletal:        General: No swelling.     Cervical back: Neck supple.  Skin:    General: Skin is warm and dry.     Capillary Refill: Capillary refill takes less than 2 seconds.  Neurological:     General: No focal deficit present.     Mental Status: She is alert and oriented to person, place, and time. Mental status is at baseline.     Comments: Cerebellar testing finger-nose is intact bilaterally, cranial nerves II through XII intact, strength and sensation intact in all 4 extremities,  Psychiatric:        Mood and Affect: Mood normal.      Procedures   If procedures were preformed on this patient, they are listed below:  Procedures  The patient was seen, evaluated, and treated in conjunction with the attending physician, who voiced agreement in the care provided.  Note generated using Dragon voice dictation software and may contain dictation errors. Please contact me for any clarification or with any questions.   Electronically signed by:  Osvaldo Shipper, M.D. (PGY-2)    Gunnar Bulla, MD 11/15/23 [place]    Alvira Monday, MD 11/16/23 [phone]

## 2023-11-15 NOTE — Telephone Encounter (Signed)
I called and spoke with Dr. Roosevelt Locks via phone call and she states that the patient needs to continue injecting insulin manually and we will see her in the office on Thursday. While Byrd Hesselbach was making the appt, I spoke with the patient spouse and advised that Roosevelt Locks wants her to come in on Thursday and to inject manually.

## 2023-11-15 NOTE — Telephone Encounter (Signed)
Patient has been called, please see other encounter.

## 2023-11-16 NOTE — Telephone Encounter (Signed)
PA request has been Submitted. New Encounter created for follow up. For additional info see Pharmacy Prior Auth telephone encounter from 11/15/23.

## 2023-11-18 ENCOUNTER — Encounter: Payer: Self-pay | Admitting: "Endocrinology

## 2023-11-18 ENCOUNTER — Ambulatory Visit: Payer: BC Managed Care – PPO | Admitting: "Endocrinology

## 2023-11-18 VITALS — BP 122/70 | HR 70 | Resp 20 | Ht 65.0 in | Wt 161.2 lb

## 2023-11-18 DIAGNOSIS — Z794 Long term (current) use of insulin: Secondary | ICD-10-CM

## 2023-11-18 DIAGNOSIS — E1165 Type 2 diabetes mellitus with hyperglycemia: Secondary | ICD-10-CM | POA: Diagnosis not present

## 2023-11-18 DIAGNOSIS — E7800 Pure hypercholesterolemia, unspecified: Secondary | ICD-10-CM

## 2023-11-18 MED ORDER — LYUMJEV 100 UNIT/ML IJ SOLN: INTRAMUSCULAR | 2 refills | Status: DC

## 2023-11-18 MED ORDER — LANTUS SOLOSTAR 100 UNIT/ML ~~LOC~~ SOPN: 14.0000 [IU] | PEN_INJECTOR | Freq: Every day | SUBCUTANEOUS | 1 refills | Status: DC

## 2023-11-18 NOTE — Patient Instructions (Signed)

## 2023-11-18 NOTE — Progress Notes (Signed)
Outpatient Endocrinology Note Mia Italy, MD  11/18/23   Mia Wilson [birth year] [id number]  Referring Provider: Milus Height, PA Primary Care Provider: Milus Height, PA Reason for consultation: Subjective   Assessment & Plan  Diagnoses and all orders for this visit:  Uncontrolled type 2 diabetes mellitus with hyperglycemia, with long-term current use of insulin (HCC) -     Insulin Lispro-aabc (LYUMJEV) 100 UNIT/ML SOLN; INJECT UPTO 50 UNITS INTO THE SKIN DAILY. USE MAX OF 50 UNITS DAILY VIA INSULIN PUMP -     GAD65, IA-2, and Insulin Autoantibody serum -     C-peptide -     Basic Metabolic Panel (BMET)  Pure hypercholesterolemia  Other orders -     insulin glargine (LANTUS SOLOSTAR) 100 UNIT/ML Solostar Pen; Inject 14 Units into the skin daily. In case of pump failure   Diabetes Type II complicated by hyperglycemia Lab Results  Component Value Date   GFR 92.07 10/13/2023   Hba1c goal less than 7, current Hba1c is  Lab Results  Component Value Date   HGBA1C 8.6 (H) 10/13/2023   Will recommend the following: Ran out of pump supplies 2 weeks ago Was giving lyumjev based on carbs Instructed to start lantus 10 units every day until pump is resumed Once pump supplies available, can resume pump 20 hrs after last lantus dose   Omnipod 5 insulin pump with DexCom G6 with Lyumjev Settings: BASAL RATES: Midnight = 0.3, 5 AM = 0.45, 9 AM = 0.3 and 7 PM = 0. 45  Bolus settings: I/C ratio 12 (5pm-12pm: 10) ISF: 60, Target: 110, with correction over 140.  Was on Farxiga, pt is not sure why she was taken off   No known contraindications/side effects to any of above medications Glucagon discussed and prescribed with refills on 10/20/23  -Last LD and Tg are as follows: Lab Results  Component Value Date   LDLCALC 107 (H) 10/13/2023    Lab Results  Component Value Date   TRIG 44.0 10/13/2023   -non-complaint with statin -On atorvastatin 10 mg  QD -Follow low fat diet and exercise   -Blood pressure goal <140/90 - Microalbumin/creatinine goal is < 30 -Last MA/Cr is as follows: Lab Results  Component Value Date   MICROALBUR <0.7 10/13/2023   -not on ACE/ARB  -diet changes including salt restriction -limit eating outside -counseled BP targets per standards of diabetes care -uncontrolled blood pressure can lead to retinopathy, nephropathy and cardiovascular and atherosclerotic heart disease  Reviewed and counseled on: -A1C target -Blood sugar targets -Complications of uncontrolled diabetes  -Checking blood sugar before meals and bedtime and bring log next visit -All medications with mechanism of action and side effects -Hypoglycemia management: rule of 15's, Glucagon Emergency Kit and medical alert ID -low-carb low-fat plate-method diet -At least 20 minutes of physical activity per day -Annual dilated retinal eye exam and foot exam -compliance and follow up needs -follow up as scheduled or earlier if problem gets worse  Call if blood sugar is less than 70 or consistently above 250    Take a 15 gm snack of carbohydrate at bedtime before you go to sleep if your blood sugar is less than 100.    If you are going to fast after midnight for a test or procedure, ask your physician for instructions on how to reduce/decrease your insulin dose.    Call if blood sugar is less than 70 or consistently above 250  -Treating a low sugar by rule of  15  (15 gms of sugar every 15 min until sugar is more than 70) If you feel your sugar is low, test your sugar to be sure If your sugar is low (less than 70), then take 15 grams of a fast acting Carbohydrate (3-4 glucose tablets or glucose gel or 4 ounces of juice or regular soda) Recheck your sugar 15 min after treating low to make sure it is more than 70 If sugar is still less than 70, treat again with 15 grams of carbohydrate          Don't drive the hour of hypoglycemia  If  unconscious/unable to eat or drink by mouth, use glucagon injection or nasal spray baqsimi and call 911. Can repeat again in 15 min if still unconscious.  Return in about 2 weeks (around 12/02/2023).   I have reviewed current medications, nurse's notes, allergies, vital signs, past medical and surgical history, family medical history, and social history for this encounter. Counseled patient on symptoms, examination findings, lab findings, imaging results, treatment decisions and monitoring and prognosis. The patient understood the recommendations and agrees with the treatment plan. All questions regarding treatment plan were fully answered.  Mia [place], MD  11/18/23    History of Present Illness Mia Wilson is a 47 y.o. year old female who presents for evaluation of Type II diabetes mellitus.  Mia Wilson was first diagnosed in 2009.   Diabetes education +  Diagnosis: date of diagnosis: 2009, initially asymptomatic  RECENT history:   Current treatment regimen:  Ran out of pump supplies 2 weeks ago Was giving lyumjev based on carbs  OMNIPOD 5 insulin pump BASAL RATES: Midnight = 0.3, 5 AM = 0.45, 9 AM = 0.3 and 7 PM = 0. 45 Bolus settings: I/C ratio 12 (5pm-12pm: 10) ISF: 60, Target: 110, with correction over 140.  Insulin: Currently using Lyumjev:  Non-insulin hypoglycemic drugs: Farxiga 10 mg daily in evening  COMPLICATIONS -  MI/Stroke -  retinopathy -  neuropathy -  nephropathy  BLOOD SUGAR DATA  CGM interpretation: At today's visit, we reviewed her CGM downloads. The full report is scanned in the media. Reviewing the CGM trends, BG are mildly elevated after dinner.    Physical Exam  BP 122/70 (BP Location: Right Arm, Patient Position: Sitting, Cuff Size: Normal)   Pulse 70   Resp 20   Ht 5\' 5"  (1.651 m)   Wt 161 lb 3.2 oz (73.1 kg)   LMP 11/10/2018 (Approximate)   SpO2 98%   BMI 26.83 kg/m    Constitutional: well developed, well  nourished Head: normocephalic, atraumatic Eyes: sclera anicteric, no redness Neck: supple Lungs: normal respiratory effort Neurology: alert and oriented Skin: dry, no appreciable rashes Musculoskeletal: no appreciable defects Psychiatric: normal mood and affect Diabetic Foot Exam - Simple   No data filed      Current Medications Patient's Medications  New Prescriptions   INSULIN GLARGINE (LANTUS SOLOSTAR) 100 UNIT/ML SOLOSTAR PEN    Inject 14 Units into the skin daily. In case of pump failure  Previous Medications   ATOMOXETINE (STRATTERA) 80 MG CAPSULE    Take 80 mg by mouth every morning.   ATORVASTATIN (LIPITOR) 10 MG TABLET    Take 10 mg by mouth daily.   AUVELITY 45-105 MG TBCR    TAKE ONE TABLET BY MOUTH TWICE A DAY, 8 HOURS APART.   BLOOD GLUCOSE MONITORING SUPPL (ONETOUCH VERIO FLEX SYSTEM) W/DEVICE KIT    CHECK BLOOD SUGAR BEFORE AND  AFTER EACH MEAL   BUPROPION (WELLBUTRIN XL) 150 MG 24 HR TABLET    Take 300 mg by mouth every morning.   CONTINUOUS GLUCOSE SENSOR (DEXCOM G6 SENSOR) MISC    USE AS INSTRUCTED CHANGE EVERY 10 DAYS   CONTINUOUS GLUCOSE TRANSMITTER (DEXCOM G6 TRANSMITTER) MISC    AS DIRECTED CHANGE EVERY 90 DAYS   DAPAGLIFLOZIN PROPANEDIOL (FARXIGA) 10 MG TABS TABLET    TAKE 1 TABLET BY MOUTH EVERY DAY BEFORE BREAKFAST   DOCUSATE SODIUM (COLACE) 100 MG CAPSULE    Take 1 capsule (100 mg total) by mouth every 12 (twelve) hours.   DULOXETINE (CYMBALTA) 60 MG CAPSULE    Take 1 capsule (60 mg total) by mouth daily.   ESTRADIOL (CLIMARA - DOSED IN MG/24 HR) 0.0375 MG/24HR PATCH    Place 0.0375 mg onto the skin once a week.   GLUCAGON (GVOKE HYPOPEN 1-PACK) 1 MG/0.2ML SOAJ    Inject 1 mg into the skin as needed (low blood sugar with impaired consciousness).   INSULIN DISPOSABLE PUMP (OMNIPOD 5 G7 PODS, GEN 5,) MISC    Inject 1 Device into the skin every 3 (three) days. 1 DEVICE BY DOES NOT APPLY ROUTE EVERY 3 (THREE) DAYS.   LANCETS (ONETOUCH DELICA PLUS LANCET30G) MISC     1 each by Does not apply route See admin instructions. Use Onetouch Delica Plus lancets to check blood sugar before and after each meal.   MULTIPLE VITAMIN (MULTIVITAMIN WITH MINERALS) TABS    Take 1 tablet by mouth daily.   ONETOUCH VERIO TEST STRIP    USE ONETOUCH VERIO FLEX AS INSTRUCTED TO CHECK BLOOD SUGAR BEFORE AND AFTER EACH MEAL.   POLYETHYLENE GLYCOL POWDER (GLYCOLAX/MIRALAX) 17 GM/SCOOP POWDER    Take 17 g by mouth 2 (two) times daily. Until daily soft stools OTC   PROGESTERONE (PROMETRIUM) 100 MG CAPSULE       VITAMIN B-12 (CYANOCOBALAMIN) 1000 MCG TABLET    Take 1,000 mcg by mouth daily.  Modified Medications   Modified Medication Previous Medication   INSULIN LISPRO-AABC (LYUMJEV) 100 UNIT/ML SOLN Insulin Lispro-aabc (LYUMJEV) 100 UNIT/ML SOLN      INJECT UPTO 50 UNITS INTO THE SKIN DAILY. USE MAX OF 50 UNITS DAILY VIA INSULIN PUMP    INJECT 30 UNITS INTO THE SKIN DAILY. USE MAX OF 30 UNITS DAILY VIA INSULIN PUMP  Discontinued Medications   No medications on file    Allergies Allergies  Allergen Reactions   Other Anaphylaxis    Peaches , tomatoes, strawberry   Strawberry Extract Hives    Facial hives and mouth hives   Tomato Hives    Facial hives and mouth hives    Past Medical History Past Medical History:  Diagnosis Date   Back pain    Diabetes mellitus    Fibroids    PCOS (polycystic ovarian syndrome)     Past Surgical History Past Surgical History:  Procedure Laterality Date   BREAST EXCISIONAL BIOPSY Right    BREAST SURGERY     benign   IR ANGIOGRAM PELVIS SELECTIVE OR SUPRASELECTIVE  11/17/2018   IR ANGIOGRAM PELVIS SELECTIVE OR SUPRASELECTIVE  11/17/2018   IR ANGIOGRAM SELECTIVE EACH ADDITIONAL VESSEL  11/17/2018   IR ANGIOGRAM SELECTIVE EACH ADDITIONAL VESSEL  11/17/2018   IR EMBO TUMOR ORGAN ISCHEMIA INFARCT INC GUIDE ROADMAPPING  11/17/2018   IR RADIOLOGIST EVAL & MGMT  09/14/2018   IR RADIOLOGIST EVAL & MGMT  12/13/2018   IR RADIOLOGIST EVAL &  MGMT  06/28/2019  IR US GUIDE VASC ACCESS RIGHT  11/17/2018   UTERINE FIBROID SURGERY      Family History family history includes Breast cancer in her maternal grandmother; Cancer in an other family member; Diabetes in her maternal grandmother, maternal uncle, and paternal grandmother; Heart disease in her maternal grandmother and paternal uncle; Hyperlipidemia in an other family member; Hypertension in an other family member; Stroke in an other family member.  Social History Social History   Socioeconomic History   Marital status: Married    Spouse name: Not on file   Number of children: Not on file   Years of education: Not on file   Highest education level: Not on file  Occupational History   Not on file  Tobacco Use   Smoking status: Never   Smokeless tobacco: Never  Substance and Sexual Activity   Alcohol use: No   Drug use: No   Sexual activity: Yes  Other Topics Concern   Not on file  Social History Narrative   Living in [place] with husband and two adopted children 15 and 14. Currently on FMLA.  She is not employed at this time. She has been her 2 youngest adopted children at home. Not involved a lot else here at this time. She has 2 other children also. She walks a little bit but does not do a lot since she has left her with fatigue.   Social Drivers of Corporate investment banker Strain: Not on file  Food Insecurity: Not on file  Transportation Needs: Not on file  Physical Activity: Not on file  Stress: Stress Concern Present (05/19/2023)   [employer] of Occupational Health - Occupational Stress Questionnaire    Feeling of Stress : Rather much  Social Connections: Unknown (05/19/2023)   Social Connection and Isolation Panel [NHANES]    Frequency of Communication with Friends and Family: More than three times a week    Frequency of Social Gatherings with Friends and Family: More than three times a week    Attends Religious Services: More than 4 times per  year    Active Member of Clubs or Organizations: Not on file    Attends Banker Meetings: Not on file    Marital Status: Married  Intimate Partner Violence: Unknown (02/26/2022)   Received from [employer], [facility]   HITS    Physically Hurt: Not on file    Insult or Talk Down To: Not on file    Threaten Physical Harm: Not on file    Scream or Curse: Not on file    Lab Results  Component Value Date   HGBA1C 8.6 (H) 10/13/2023   HGBA1C 7.3 (H) 04/30/2023   HGBA1C 7.4 (H) 12/08/2022   Lab Results  Component Value Date   CHOL 206 (H) 10/13/2023   Lab Results  Component Value Date   HDL 89.60 10/13/2023   Lab Results  Component Value Date   LDLCALC 107 (H) 10/13/2023   Lab Results  Component Value Date   TRIG 44.0 10/13/2023   Lab Results  Component Value Date   CHOLHDL 2 10/13/2023   Lab Results  Component Value Date   CREATININE 0.79 11/15/2023   Lab Results  Component Value Date   GFR 92.07 10/13/2023   Lab Results  Component Value Date   MICROALBUR <0.7 10/13/2023      Component Value Date/Time   NA 134 (L) 11/15/2023 1609   K 3.9 11/15/2023 1609   CL 103 11/15/2023 1609  CO2 22 11/15/2023 1609   GLUCOSE 249 (H) 11/15/2023 1609   BUN 11 11/15/2023 1609   CREATININE 0.79 11/15/2023 1609   CREATININE 0.67 04/30/2012 1319   CALCIUM 8.8 (L) 11/15/2023 1609   PROT 6.4 (L) 11/15/2023 1609   ALBUMIN 3.6 11/15/2023 1609   AST 29 11/15/2023 1609   ALT 17 11/15/2023 1609   ALKPHOS 89 11/15/2023 1609   BILITOT 0.6 11/15/2023 1609   GFRNONAA >60 11/15/2023 1609   GFRAA >60 11/17/2018 0758      Latest Ref Rng & Units 11/15/2023    4:09 PM 11/11/2023    6:52 PM 10/13/2023    8:35 AM  BMP  Glucose 70 - 99 mg/dL 102  725  366   BUN 6 - 20 mg/dL 11  15  14    Creatinine 0.44 - 1.00 mg/dL 4.40  3.47  4.25   Sodium 135 - 145 mmol/L 134  138  138   Potassium 3.5 - 5.1 mmol/L 3.9  4.0  4.0   Chloride 98 - 111 mmol/L 103  102  103   CO2  22 - 32 mmol/L 22  28  29    Calcium 8.9 - 10.3 mg/dL 8.8  9.5  9.1        Component Value Date/Time   WBC 7.0 11/15/2023 1609   RBC 4.48 11/15/2023 1609   HGB 11.8 (L) 11/15/2023 1609   HCT 37.5 11/15/2023 1609   PLT 259 11/15/2023 1609   MCV 83.7 11/15/2023 1609   MCV 78.0 (A) 04/30/2012 1351   MCH 26.3 11/15/2023 1609   MCHC 31.5 11/15/2023 1609   RDW 13.5 11/15/2023 1609   LYMPHSABS 1.1 11/15/2023 1609   MONOABS 0.6 11/15/2023 1609   EOSABS 0.0 11/15/2023 1609   BASOSABS 0.0 11/15/2023 1609     Parts of this note may have been dictated using voice recognition software. There may be variances in spelling and vocabulary which are unintentional. Not all errors are proofread. Please notify the Thereasa Parkin if any discrepancies are noted or if the meaning of any statement is not clear.

## 2023-11-19 ENCOUNTER — Other Ambulatory Visit (HOSPITAL_COMMUNITY): Payer: Self-pay

## 2023-11-19 ENCOUNTER — Encounter: Payer: Self-pay | Admitting: Professional Counselor

## 2023-11-19 ENCOUNTER — Ambulatory Visit (INDEPENDENT_AMBULATORY_CARE_PROVIDER_SITE_OTHER): Payer: BC Managed Care – PPO | Admitting: Professional Counselor

## 2023-11-19 DIAGNOSIS — F411 Generalized anxiety disorder: Secondary | ICD-10-CM | POA: Diagnosis not present

## 2023-11-19 DIAGNOSIS — F331 Major depressive disorder, recurrent, moderate: Secondary | ICD-10-CM

## 2023-11-19 NOTE — Progress Notes (Unsigned)
      [facility] Counselor/Therapist Progress Note  [name], MRN: [id number],    Date: 11/19/2023  Time Spent: ***   Treatment Type: Individual Therapy  Reported Symptoms: ***  Mental Status Exam:  Appearance:   Neat     Behavior:  Appropriate, Sharing, and Motivated  Motor:  Normal  Speech/Language:   Clear and Coherent and Normal Rate  Affect:  Appropriate and Congruent  Mood:  normal  Thought process:  normal  Thought content:    WNL  Sensory/Perceptual disturbances:    WNL  Orientation:  Sound  Attention:  Good  Concentration:  Good  Memory:  WNL  Fund of knowledge:   Good  Insight:    Good  Judgment:   Good  Impulse Control:  Good   Risk Assessment: Danger to Self:  No Self-injurious Behavior: No Danger to Others: No Duty to Warn:no Physical Aggression / Violence:No  Access to Firearms a concern: No  Gang Involvement:No   Subjective: ***   Interventions: Solution-Oriented/Positive Psychology, Humanistic/Existential, and Insight-Oriented  Diagnosis:   ICD-10-CM   1. Generalized anxiety disorder  F41.1     2. Major depressive disorder, recurrent episode, moderate (HCC)  F33.1       Plan: ***  Gaspar Bidding, [facility]

## 2023-11-19 NOTE — Telephone Encounter (Signed)
Pharmacy Patient Advocate Encounter  Received notification from [employer] that Prior Authorization for Omnipod 5 DexG7G6 Pods Gen 5 has been APPROVED from 11/15/23 to 11/14/24 Her ins will not process in our system. Since the RX is sent to a mail order pharmacy, the pt will have to call to get them to fill it. Otherwise, she will have to call her ins to see where her preferred pharmacy is to have it filled locally.   PA #/Case ID/Reference #: PA Case ID #: [id number]

## 2023-11-22 ENCOUNTER — Ambulatory Visit: Payer: BC Managed Care – PPO | Admitting: "Endocrinology

## 2023-11-22 ENCOUNTER — Ambulatory Visit: Payer: BC Managed Care – PPO | Admitting: Gastroenterology

## 2023-11-22 ENCOUNTER — Encounter: Payer: Self-pay | Admitting: Gastroenterology

## 2023-11-22 VITALS — BP 110/70 | HR 71 | Ht 65.0 in | Wt 159.0 lb

## 2023-11-22 DIAGNOSIS — K5900 Constipation, unspecified: Secondary | ICD-10-CM

## 2023-11-22 DIAGNOSIS — R112 Nausea with vomiting, unspecified: Secondary | ICD-10-CM | POA: Diagnosis not present

## 2023-11-22 DIAGNOSIS — R1030 Lower abdominal pain, unspecified: Secondary | ICD-10-CM

## 2023-11-22 DIAGNOSIS — R55 Syncope and collapse: Secondary | ICD-10-CM

## 2023-11-22 NOTE — Patient Instructions (Addendum)
Follow up in 8 weeks.  Start taking Miralax 1 capful (17 grams) 1x / day for 1 week.   If this is not effective, increase to 1 dose 2x / day for 1 week.   If this is still not effective, increase to two capfuls (34 grams) 2x / day.   Can adjust dose as needed based on response. Can take 1/2 cap daily, skip days, or increase per day.    Due to recent changes in healthcare laws, you may see the results of your imaging and laboratory studies on MyChart before your provider has had a chance to review them.  We understand that in some cases there may be results that are confusing or concerning to you. Not all laboratory results come back in the same time frame and the provider may be waiting for multiple results in order to interpret others.  Please give [place] 48 hours in order for your provider to thoroughly review all the results before contacting the office for clarification of your results.   Thank you for trusting me with your gastrointestinal care!   Boone Master, PA

## 2023-11-22 NOTE — Progress Notes (Signed)
Chief Complaint: hospital follow up [facility] MD: Gentry Fitz  HPI: 47 year old female history of anxiety, depression, PCOS, diabetes, presents for hospital follow-up  Patient recently seen in emergency department  11/11/2023 and 11/15/2023 for abdominal pain and syncope.  She was eating at a [place] and prior to the meal she gave herself 10 units insulin.  After her meal she had a syncopal episode for 5 minutes in which she was unresponsive but breathing.  Her blood sugar was 130s per her Dexcom prior to her syncope but she felt like her blood sugar was dropping quickly.  She has not had her insulin pump due to insurance issues and has been doing short acting lispro. She passed out and when she woke up she had an episode of profuse vomiting and this syncope prompted her visit 12/23.  ED thought her syncope was secondary to vasovagal response. Workup including EKG, chest x-ray, labs were all normal.  Normal CMP and lipase.  CBC with mild stable anemia of 11.8  CT abdomen pelvis with contrast 11/11/2023 - Moderate colonic stool burden - Hepatic steatosis - Uterine fibroids - Normal gallbladder  She has noted over the past 4 weeks having generalized abdominal cramping that is intermittent with associated constipation.  Patient also reports persistent nausea.  She recently got her insulin pump 3 days ago.  No nausea endorses eating her can be worse in the morning.  She took MiraLAX for 2 days and after not seeing results she stopped.  She states in the past she was given Linzess when her fibroids were bad and this gave her good cleanout and made her feel better.  She reports having a colonoscopy 4 years ago with [facility] and states she had no polyps.  She was given a recall 5 years due to family history of colon polyps in her father.  Past Medical History:  Diagnosis Date   Back pain    Diabetes mellitus    Fibroids    PCOS (polycystic ovarian syndrome)     Past Surgical  History:  Procedure Laterality Date   BREAST EXCISIONAL BIOPSY Right    BREAST SURGERY     benign   IR ANGIOGRAM PELVIS SELECTIVE OR SUPRASELECTIVE  11/17/2018   IR ANGIOGRAM PELVIS SELECTIVE OR SUPRASELECTIVE  11/17/2018   IR ANGIOGRAM SELECTIVE EACH ADDITIONAL VESSEL  11/17/2018   IR ANGIOGRAM SELECTIVE EACH ADDITIONAL VESSEL  11/17/2018   IR EMBO TUMOR ORGAN ISCHEMIA INFARCT INC GUIDE ROADMAPPING  11/17/2018   IR RADIOLOGIST EVAL & MGMT  09/14/2018   IR RADIOLOGIST EVAL & MGMT  12/13/2018   IR RADIOLOGIST EVAL & MGMT  06/28/2019   IR US GUIDE VASC ACCESS RIGHT  11/17/2018   UTERINE FIBROID SURGERY      Current Outpatient Medications  Medication Sig Dispense Refill   atorvastatin (LIPITOR) 10 MG tablet Take 10 mg by mouth daily.     AUVELITY 45-105 MG TBCR TAKE ONE TABLET BY MOUTH TWICE A DAY, 8 HOURS APART. 60 tablet 0   Blood Glucose Monitoring Suppl (ONETOUCH VERIO FLEX SYSTEM) w/Device KIT CHECK BLOOD SUGAR BEFORE AND AFTER EACH MEAL 1 kit 0   Continuous Glucose Sensor (DEXCOM G6 SENSOR) MISC USE AS INSTRUCTED CHANGE EVERY 10 DAYS 9 each 3   Continuous Glucose Transmitter (DEXCOM G6 TRANSMITTER) MISC AS DIRECTED CHANGE EVERY 90 DAYS 1 each 3   DULoxetine (CYMBALTA) 60 MG capsule Take 1 capsule (60 mg total) by mouth daily. 90 capsule 1   estradiol (CLIMARA -  DOSED IN MG/24 HR) 0.0375 mg/24hr patch Place 0.0375 mg onto the skin once a week.     Glucagon (GVOKE HYPOPEN 1-PACK) 1 MG/0.2ML SOAJ Inject 1 mg into the skin as needed (low blood sugar with impaired consciousness). 0.4 mL 2   Insulin Disposable Pump (OMNIPOD 5 G7 PODS, GEN 5,) MISC Inject 1 Device into the skin every 3 (three) days. 1 DEVICE BY DOES NOT APPLY ROUTE EVERY 3 (THREE) DAYS. 30 each 2   insulin glargine (LANTUS SOLOSTAR) 100 UNIT/ML Solostar Pen Inject 14 Units into the skin daily. In case of pump failure 6 mL 1   Insulin Lispro-aabc (LYUMJEV) 100 UNIT/ML SOLN INJECT UPTO 50 UNITS INTO THE SKIN DAILY. USE MAX OF 50  UNITS DAILY VIA INSULIN PUMP 75 mL 2   Lancets (ONETOUCH DELICA PLUS LANCET30G) MISC 1 each by Does not apply route See admin instructions. Use Onetouch Delica Plus lancets to check blood sugar before and after each meal. 200 each 3   Multiple Vitamin (MULTIVITAMIN WITH MINERALS) TABS Take 1 tablet by mouth daily.     ONETOUCH VERIO test strip USE ONETOUCH VERIO FLEX AS INSTRUCTED TO CHECK BLOOD SUGAR BEFORE AND AFTER EACH MEAL. 200 strip 3   polyethylene glycol powder (GLYCOLAX/MIRALAX) 17 GM/SCOOP powder Take 17 g by mouth 2 (two) times daily. Until daily soft stools OTC 238 g 0   progesterone (PROMETRIUM) 100 MG capsule      vitamin B-12 (CYANOCOBALAMIN) 1000 MCG tablet Take 1,000 mcg by mouth daily.     atomoxetine (STRATTERA) 80 MG capsule Take 80 mg by mouth every morning. (Patient not taking: Reported on 11/22/2023)     buPROPion (WELLBUTRIN XL) 150 MG 24 hr tablet Take 300 mg by mouth every morning. (Patient not taking: Reported on 11/22/2023)     dapagliflozin propanediol (FARXIGA) 10 MG TABS tablet TAKE 1 TABLET BY MOUTH EVERY DAY BEFORE BREAKFAST (Patient not taking: Reported on 10/20/2023) 90 tablet 1   docusate sodium (COLACE) 100 MG capsule Take 1 capsule (100 mg total) by mouth every 12 (twelve) hours. (Patient not taking: Reported on 11/22/2023) 60 capsule 0   No current facility-administered medications for this visit.    Allergies as of 11/22/2023 - Review Complete 11/22/2023  Allergen Reaction Noted   Other Anaphylaxis 04/08/2016   Strawberry extract Hives 11/08/2013   Tomato Hives 11/08/2013    Family History  Problem Relation Age of Onset   Hyperlipidemia Other    Hypertension Other    Stroke Other    Cancer Other    Diabetes Maternal Uncle    Heart disease Paternal Uncle    Diabetes Maternal Grandmother    Heart disease Maternal Grandmother    Breast cancer Maternal Grandmother        diagnosed in her 48's   Diabetes Paternal Grandmother     Social History    Socioeconomic History   Marital status: Married    Spouse name: Not on file   Number of children: 4   Years of education: Not on file   Highest education level: Not on file  Occupational History   Occupation: research admn.  Tobacco Use   Smoking status: Never   Smokeless tobacco: Never  Vaping Use   Vaping status: Never Used  Substance and Sexual Activity   Alcohol use: No   Drug use: No   Sexual activity: Yes  Other Topics Concern   Not on file  Social History Narrative   Living in [place] with husband and  two adopted children 15 and 14. Currently on FMLA.  She is not employed at this time. She has been her 2 youngest adopted children at home. Not involved a lot else here at this time. She has 2 other children also. She walks a little bit but does not do a lot since she has left her with fatigue.   Social Drivers of Corporate investment banker Strain: Not on file  Food Insecurity: Not on file  Transportation Needs: Not on file  Physical Activity: Not on file  Stress: Stress Concern Present (05/19/2023)   [employer] of Occupational Health - Occupational Stress Questionnaire    Feeling of Stress : Rather much  Social Connections: Unknown (05/19/2023)   Social Connection and Isolation Panel [NHANES]    Frequency of Communication with Friends and Family: More than three times a week    Frequency of Social Gatherings with Friends and Family: More than three times a week    Attends Religious Services: More than 4 times per year    Active Member of [employer] or Organizations: Not on file    Attends Banker Meetings: Not on file    Marital Status: Married  Intimate Partner Violence: Unknown (02/26/2022)   Received from [employer], [facility]   HITS    Physically Hurt: Not on file    Insult or Talk Down To: Not on file    Threaten Physical Harm: Not on file    Scream or Curse: Not on file    Review of Systems:    Constitutional: No weight loss,  fever, chills, weakness or fatigue HEENT: Eyes: No change in vision               Ears, Nose, Throat:  No change in hearing or congestion Skin: No rash or itching Cardiovascular: No chest pain, chest pressure or palpitations   Respiratory: No SOB or cough Gastrointestinal: See HPI and otherwise negative Genitourinary: No dysuria or change in urinary frequency Neurological: No headache, dizziness or syncope Musculoskeletal: No new muscle or joint pain Hematologic: No bleeding or bruising Psychiatric: No history of depression or anxiety    Physical Exam:  Vital signs: BP 110/70   Pulse 71   Ht 5\' 5"  (1.651 m)   Wt 159 lb (72.1 kg)   LMP 11/10/2018 (Approximate)   BMI 26.46 kg/m   Constitutional: NAD, Well developed, Well nourished, alert and cooperative Head:  Normocephalic and atraumatic. Eyes:   PEERL, EOMI. No icterus. Conjunctiva pink. Respiratory: Respirations even and unlabored. Lungs clear to auscultation bilaterally.   No wheezes, crackles, or rhonchi.  Cardiovascular:  Regular rate and rhythm. No peripheral edema, cyanosis or pallor.  Gastrointestinal:  Soft, nondistended, nontender. No rebound or guarding. Normal bowel sounds. No appreciable masses or hepatomegaly. Rectal:  Not performed.  Msk:  Symmetrical without gross deformities. Without edema, no deformity or joint abnormality.  Neurologic:  Alert and  oriented x4;  grossly normal neurologically.  Skin:   Dry and intact without significant lesions or rashes. Psychiatric: Oriented to person, place and time. Demonstrates good judgement and reason without abnormal affect or behaviors.   RELEVANT LABS AND IMAGING: CBC    Component Value Date/Time   WBC 7.0 11/15/2023 1609   RBC 4.48 11/15/2023 1609   HGB 11.8 (L) 11/15/2023 1609   HCT 37.5 11/15/2023 1609   PLT 259 11/15/2023 1609   MCV 83.7 11/15/2023 1609   MCV 78.0 (A) 04/30/2012 1351   MCH 26.3 11/15/2023 1609  MCHC 31.5 11/15/2023 1609   RDW 13.5  11/15/2023 1609   LYMPHSABS 1.1 11/15/2023 1609   MONOABS 0.6 11/15/2023 1609   EOSABS 0.0 11/15/2023 1609   BASOSABS 0.0 11/15/2023 1609    CMP     Component Value Date/Time   NA 140 11/18/2023 1049   K 4.4 11/18/2023 1049   CL 103 11/18/2023 1049   CO2 24 11/18/2023 1049   GLUCOSE 212 (H) 11/18/2023 1049   BUN 11 11/18/2023 1049   CREATININE 0.71 11/18/2023 1049   CALCIUM 9.3 11/18/2023 1049   PROT 6.4 (L) 11/15/2023 1609   ALBUMIN 3.6 11/15/2023 1609   AST 29 11/15/2023 1609   ALT 17 11/15/2023 1609   ALKPHOS 89 11/15/2023 1609   BILITOT 0.6 11/15/2023 1609   GFRNONAA >60 11/15/2023 1609   GFRAA >60 11/17/2018 0758     Assessment/Plan:   Constipation, unspecified constipation type Lower abdominal pain CT scan showing moderate stool burden and patient will use MiraLAX for 2 days.  She has had Linzess in the past with adequate response.  She is up-to-date on her colonoscopy (we will obtain these records).  Extensive discussion with patient and husband about next steps including conservative management with medication versus proceeding with EGD/colonoscopy.  Patient would prefer conservative management and if no improvement she will proceed with procedures. - samples of Linzess 145 mcg - After she completes Linzess for "clean out" she states she would like to be maintained on MiraLAX as linzess doesn't work well for her long term.   - Follow-up 8 weeks - If persistent symptoms of constipation, abdominal pain, and nausea would recommend EGD/colonoscopy for further evaluation - Obtain previous records from [facility]  Nausea and vomiting, unspecified vomiting type Patient has had persistent nausea ongoing since she has been constipated.  Possibly multifactorial with her currently uncontrolled diabetes since not being on her insulin pump versus flare of constipation.  Patient could be having some gastroparesis.  Reassuringly she got her insulin pump so close follow-up.  Only 1  episode of vomiting after syncopal episode which was likely related to low blood sugar versus vasovagal - Optimize bowel regimen - Optimize sugar control with insulin pump - If persistent nausea, please let us know - She will check in with [place] next week - Follow-up 8 weeks  Syncopal episode Occurred after eating at a [place] after being given fast acting insulin.  Workup in ED is negative for cardiac cause.  Thought to be vasovagal versus sugar related.   Lara Mulch [facility] 11/22/2023, 11:11 AM  Cc: Milus Height, PA

## 2023-11-26 LAB — BASIC METABOLIC PANEL
BUN: 11 mg/dL (ref 7–25)
CO2: 24 mmol/L (ref 20–32)
Calcium: 9.3 mg/dL (ref 8.6–10.2)
Chloride: 103 mmol/L (ref 98–110)
Creat: 0.71 mg/dL (ref 0.50–0.99)
Glucose, Bld: 212 mg/dL — ABNORMAL HIGH (ref 65–99)
Potassium: 4.4 mmol/L (ref 3.5–5.3)
Sodium: 140 mmol/L (ref 135–146)

## 2023-11-26 LAB — GAD65, IA-2, AND INSULIN AUTOANTIBODY SERUM
Glutamic Acid Decarb Ab: <5 [IU]/mL (ref <5)
IA-2 Antibody: <5.4 U/mL (ref <5.4)
Insulin Antibodies, Human: 6.8 U/mL — ABNORMAL HIGH (ref <0.4)

## 2023-11-26 LAB — C-PEPTIDE: C-Peptide: 0.53 ng/mL — ABNORMAL LOW (ref 0.80–3.85)

## 2023-12-02 ENCOUNTER — Ambulatory Visit: Payer: BC Managed Care – PPO | Admitting: "Endocrinology

## 2023-12-02 ENCOUNTER — Encounter: Payer: Self-pay | Admitting: "Endocrinology

## 2023-12-02 ENCOUNTER — Other Ambulatory Visit (HOSPITAL_COMMUNITY): Payer: Self-pay

## 2023-12-02 ENCOUNTER — Telehealth: Payer: Self-pay

## 2023-12-02 VITALS — BP 120/80 | HR 99 | Ht 65.0 in | Wt 162.4 lb

## 2023-12-02 DIAGNOSIS — E1065 Type 1 diabetes mellitus with hyperglycemia: Secondary | ICD-10-CM | POA: Diagnosis not present

## 2023-12-02 DIAGNOSIS — E7800 Pure hypercholesterolemia, unspecified: Secondary | ICD-10-CM | POA: Diagnosis not present

## 2023-12-02 NOTE — Patient Instructions (Signed)

## 2023-12-02 NOTE — Progress Notes (Signed)
 Outpatient Endocrinology Note Mia Birmingham, MD  12/02/23   TURKESSA OSTROM Nov 09, 1976 [id number]  Referring Provider: Redmon, Noelle, PA Primary Care Provider: Redmon, Noelle, PA Reason for consultation: Subjective   Assessment & Plan  Mia Wilson was seen today for follow-up.  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 45  Bolus settings: I/C ratio 10 (5pm-12pm: 8) ISF: 60, Target: 110, with correction over 140.  Stopped farxiga   No known contraindications/side effects to any of above medications Glucagon  discussed and prescribed with refills on 10/20/23  -Last LD and Tg are as follows: Lab Results  Component Value Date   LDLCALC 107 (H) 10/13/2023    Lab Results  Component Value Date   TRIG 44.0 10/13/2023   -On atorvastatin  10 mg QD -Follow low fat diet and exercise   -Blood pressure goal <140/90 - Microalbumin/creatinine goal is < 30 -Last MA/Cr is as follows: Lab Results  Component Value Date   MICROALBUR <0.7 10/13/2023   -not on ACE/ARB  -diet changes including salt restriction -limit eating outside -counseled BP targets per standards of diabetes care -uncontrolled blood pressure can lead to retinopathy, nephropathy and cardiovascular and atherosclerotic heart disease  Reviewed and  counseled on: -A1C target -Blood sugar targets -Complications of uncontrolled diabetes  -Checking blood sugar before meals and bedtime and bring log next visit -All medications with mechanism of action and side effects -Hypoglycemia management: rule of 15's, Glucagon  Emergency Kit and medical alert ID -low-carb low-fat plate-method diet -At least 20 minutes of physical activity per day -Annual dilated retinal eye exam and foot exam -compliance and follow up needs -follow up as scheduled or earlier if problem gets worse  Call if blood sugar is less than 70 or consistently above 250    Take a 15 gm snack of carbohydrate at bedtime before you go to sleep if your blood sugar is less than 100.    If you are going to fast after midnight for a test or procedure, ask your physician for instructions on how to reduce/decrease your insulin  dose.    Call if blood sugar is less than 70 or consistently above 250  -Treating a low sugar by rule of 15  (15 gms of sugar every 15 min until sugar is more than 70) If you feel your sugar is low, test your sugar to be sure If your sugar is low (less than 70), then take 15 grams of a fast acting Carbohydrate (3-4 glucose tablets or glucose gel or 4 ounces of juice or regular soda) Recheck your sugar 15 min after treating low to make sure it is more than 70 If sugar is still less than 70, treat again with 15 grams of carbohydrate  Don't drive the hour of hypoglycemia  If unconscious/unable to eat or drink by mouth, use glucagon  injection or nasal spray baqsimi and call 911. Can repeat again in 15 min if still unconscious.  Return in about 4 weeks (around 12/30/2023).   I have reviewed current medications, nurse's notes, allergies, vital signs, past medical and surgical history, family medical history, and social history for this encounter. Counseled patient on symptoms, examination findings, lab findings, imaging results, treatment decisions and  monitoring and prognosis. The patient understood the recommendations and agrees with the treatment plan. All questions regarding treatment plan were fully answered.  Mia Birmingham, MD  12/02/23   History of Present Illness Mia Wilson is a 48 y.o. year old female who presents for evaluation of Type II diabetes mellitus.  Mia Wilson was first diagnosed in 2009.   Diabetes education +  Diagnosis: date of diagnosis: 2009, initially asymptomatic  RECENT history:   Current treatment regimen:  OMNIPOD 5 insulin  pump BASAL RATES: Midnight = 0.3, 5 AM = 0.45, 9 AM = 0.3 and 7 PM = 0. 45 Bolus settings: I/C ratio 12 (5pm-12pm: 10) ISF: 60, Target: 110, with correction over 140.  Insulin : Currently using Lyumjev   Non-insulin  hypoglycemic drugs: stopped Farxiga  10 mg daily  COMPLICATIONS -  MI/Stroke -  retinopathy -  neuropathy -  nephropathy  BLOOD SUGAR DATA  CGM interpretation: At today's visit, we reviewed her CGM downloads. The full report is scanned in the media. Reviewing the CGM trends, BG are mildly elevated after dinner>after lunch.    Physical Exam  BP 120/80 (BP Location: Left Arm, Patient Position: Sitting, Cuff Size: Normal)   Pulse 99   Ht 5' 5 (1.651 m)   Wt 162 lb 6.4 oz (73.7 kg)   LMP 11/10/2018 (Approximate)   SpO2 (!) 72%   BMI 27.02 kg/m    Constitutional: well developed, well nourished Head: normocephalic, atraumatic Eyes: sclera anicteric, no redness Neck: supple Lungs: normal respiratory effort Neurology: alert and oriented Skin: dry, no appreciable rashes Musculoskeletal: no appreciable defects Psychiatric: normal mood and affect Diabetic Foot Exam - Simple   No data filed      Current Medications Patient's Medications  New Prescriptions   No medications on file  Previous Medications   ATOMOXETINE  (STRATTERA ) 80 MG CAPSULE    Take 80 mg by mouth every morning.   ATORVASTATIN  (LIPITOR) 10 MG TABLET    Take 10 mg by mouth  daily.   AUVELITY  45-105 MG TBCR    TAKE ONE TABLET BY MOUTH TWICE A DAY, 8 HOURS APART.   BLOOD GLUCOSE MONITORING SUPPL (ONETOUCH VERIO FLEX SYSTEM) W/DEVICE KIT    CHECK BLOOD SUGAR BEFORE AND AFTER EACH MEAL   BUPROPION (WELLBUTRIN XL) 150 MG 24 HR TABLET    Take 300 mg by mouth every morning.   CONTINUOUS GLUCOSE SENSOR (DEXCOM G6 SENSOR) MISC    USE AS INSTRUCTED CHANGE EVERY 10 DAYS   CONTINUOUS GLUCOSE TRANSMITTER (DEXCOM G6 TRANSMITTER) MISC    AS DIRECTED CHANGE EVERY 90 DAYS   DAPAGLIFLOZIN  PROPANEDIOL (FARXIGA ) 10 MG TABS TABLET    TAKE 1 TABLET BY MOUTH EVERY DAY BEFORE BREAKFAST   DOCUSATE SODIUM  (COLACE) 100 MG CAPSULE    Take 1 capsule (100 mg total) by mouth every 12 (twelve) hours.   DULOXETINE  (CYMBALTA ) 60 MG CAPSULE    Take 1 capsule (60 mg total) by mouth daily.   ESTRADIOL (CLIMARA - DOSED IN MG/24 HR) 0.0375 MG/24HR PATCH  Place 0.0375 mg onto the skin once a week.   GLUCAGON  (GVOKE HYPOPEN  1-PACK) 1 MG/0.2ML SOAJ    Inject 1 mg into the skin as needed (low blood sugar with impaired consciousness).   INSULIN  DISPOSABLE PUMP (OMNIPOD 5 G7 PODS, GEN 5,) MISC    Inject 1 Device into the skin every 3 (three) days. 1 DEVICE BY DOES NOT APPLY ROUTE EVERY 3 (THREE) DAYS.   INSULIN  GLARGINE (LANTUS  SOLOSTAR) 100 UNIT/ML SOLOSTAR PEN    Inject 14 Units into the skin daily. In case of pump failure   INSULIN  LISPRO-AABC (LYUMJEV ) 100 UNIT/ML SOLN    INJECT UPTO 50 UNITS INTO THE SKIN DAILY. USE MAX OF 50 UNITS DAILY VIA INSULIN  PUMP   LANCETS (ONETOUCH DELICA PLUS LANCET30G) MISC    1 each by Does not apply route See admin instructions. Use Onetouch Delica Plus lancets to check blood sugar before and after each meal.   MULTIPLE VITAMIN (MULTIVITAMIN WITH MINERALS) TABS    Take 1 tablet by mouth daily.   ONETOUCH VERIO TEST STRIP    USE ONETOUCH VERIO FLEX AS INSTRUCTED TO CHECK BLOOD SUGAR BEFORE AND AFTER EACH MEAL.   POLYETHYLENE GLYCOL POWDER (GLYCOLAX /MIRALAX ) 17 GM/SCOOP POWDER     Take 17 g by mouth 2 (two) times daily. Until daily soft stools OTC   PROGESTERONE (PROMETRIUM) 100 MG CAPSULE       VITAMIN B-12 (CYANOCOBALAMIN ) 1000 MCG TABLET    Take 1,000 mcg by mouth daily.  Modified Medications   No medications on file  Discontinued Medications   No medications on file    Allergies Allergies  Allergen Reactions   Other Anaphylaxis    Peaches , tomatoes, strawberry   Strawberry Extract Hives    Facial hives and mouth hives   Tomato Hives    Facial hives and mouth hives    Past Medical History Past Medical History:  Diagnosis Date   Back pain    Diabetes mellitus    Fibroids    PCOS (polycystic ovarian syndrome)     Past Surgical History Past Surgical History:  Procedure Laterality Date   BREAST EXCISIONAL BIOPSY Right    BREAST SURGERY     benign   IR ANGIOGRAM PELVIS SELECTIVE OR SUPRASELECTIVE  11/17/2018   IR ANGIOGRAM PELVIS SELECTIVE OR SUPRASELECTIVE  11/17/2018   IR ANGIOGRAM SELECTIVE EACH ADDITIONAL VESSEL  11/17/2018   IR ANGIOGRAM SELECTIVE EACH ADDITIONAL VESSEL  11/17/2018   IR EMBO TUMOR ORGAN ISCHEMIA INFARCT INC GUIDE ROADMAPPING  11/17/2018   IR RADIOLOGIST EVAL & MGMT  09/14/2018   IR RADIOLOGIST EVAL & MGMT  12/13/2018   IR RADIOLOGIST EVAL & MGMT  06/28/2019   IR US  GUIDE VASC ACCESS RIGHT  11/17/2018   UTERINE FIBROID SURGERY      Family History family history includes Breast cancer in her maternal grandmother; Cancer in an other family member; Diabetes in her maternal grandmother, maternal uncle, and paternal grandmother; Heart disease in her maternal grandmother and paternal uncle; Hyperlipidemia in an other family member; Hypertension in an other family member; Stroke in an other family member.  Social History Social History   Socioeconomic History   Marital status: Married    Spouse name: Not on file   Number of children: 4   Years of education: Not on file   Highest education level: Not on file  Occupational  History   Occupation: research admn.  Tobacco Use   Smoking status: Never   Smokeless tobacco: Never  Vaping Use   Vaping status: Never Used  Substance and Sexual Activity   Alcohol use: No   Drug use: No   Sexual activity: Yes  Other Topics Concern   Not on file  Social History Narrative   Living in [place] with husband and two adopted children 15 and 5. Currently on FMLA.  She is not employed at this time. She has been her 2 youngest adopted children at home. Not involved a lot else here at this time. She has 2 other children also. She walks a little bit but does not do a lot since she has left her with fatigue.   Social Drivers of Corporate Investment Banker Strain: Not on file  Food Insecurity: Not on file  Transportation Needs: Not on file  Physical Activity: Not on file  Stress: Stress Concern Present (05/19/2023)   [employer] of Occupational Health - Occupational Stress Questionnaire    Feeling of Stress : Rather much  Social Connections: Unknown (05/19/2023)   Social Connection and Isolation Panel [NHANES]    Frequency of Communication with Friends and Family: More than three times a week    Frequency of Social Gatherings with Friends and Family: More than three times a week    Attends Religious Services: More than 4 times per year    Active Member of [employer] or Organizations: Not on file    Attends Banker Meetings: Not on file    Marital Status: Married  Intimate Partner Violence: Unknown (02/26/2022)   Received from [facility]   HITS    Physically Hurt: Not on file    Insult or Talk Down To: Not on file    Threaten Physical Harm: Not on file    Scream or Curse: Not on file    Lab Results  Component Value Date   HGBA1C 8.6 (H) 10/13/2023   HGBA1C 7.3 (H) 04/30/2023   HGBA1C 7.4 (H) 12/08/2022   Lab Results  Component Value Date   CHOL 206 (H) 10/13/2023   Lab Results  Component Value Date   HDL 89.60 10/13/2023    Lab Results  Component Value Date   LDLCALC 107 (H) 10/13/2023   Lab Results  Component Value Date   TRIG 44.0 10/13/2023   Lab Results  Component Value Date   CHOLHDL 2 10/13/2023   Lab Results  Component Value Date   CREATININE 0.71 11/18/2023   Lab Results  Component Value Date   GFR 92.07 10/13/2023   Lab Results  Component Value Date   MICROALBUR <0.7 10/13/2023      Component Value Date/Time   NA 140 11/18/2023 1049   K 4.4 11/18/2023 1049   CL 103 11/18/2023 1049   CO2 24 11/18/2023 1049   GLUCOSE 212 (H) 11/18/2023 1049   BUN 11 11/18/2023 1049   CREATININE 0.71 11/18/2023 1049   CALCIUM  9.3 11/18/2023 1049   PROT 6.4 (L) 11/15/2023 1609   ALBUMIN 3.6 11/15/2023 1609   AST 29 11/15/2023 1609   ALT 17 11/15/2023 1609   ALKPHOS 89 11/15/2023 1609   BILITOT 0.6 11/15/2023 1609   GFRNONAA >60 11/15/2023 1609   GFRAA >60 11/17/2018 0758      Latest Ref Rng & Units 11/18/2023   10:49 AM 11/15/2023    4:09 PM 11/11/2023    6:52 PM  BMP  Glucose 65 - 99 mg/dL 787  750  573   BUN 7 - 25 mg/dL 11  11  15  Creatinine 0.50 - 0.99 mg/dL 9.28  9.20  9.15   BUN/Creat Ratio 6 - 22 (calc) SEE NOTE:     Sodium 135 - 146 mmol/L 140  134  138   Potassium 3.5 - 5.3 mmol/L 4.4  3.9  4.0   Chloride 98 - 110 mmol/L 103  103  102   CO2 20 - 32 mmol/L 24  22  28    Calcium  8.6 - 10.2 mg/dL 9.3  8.8  9.5        Component Value Date/Time   WBC 7.0 11/15/2023 1609   RBC 4.48 11/15/2023 1609   HGB 11.8 (L) 11/15/2023 1609   HCT 37.5 11/15/2023 1609   PLT 259 11/15/2023 1609   MCV 83.7 11/15/2023 1609   MCV 78.0 (A) 04/30/2012 1351   MCH 26.3 11/15/2023 1609   MCHC 31.5 11/15/2023 1609   RDW 13.5 11/15/2023 1609   LYMPHSABS 1.1 11/15/2023 1609   MONOABS 0.6 11/15/2023 1609   EOSABS 0.0 11/15/2023 1609   BASOSABS 0.0 11/15/2023 1609     Parts of this note may have been dictated using voice recognition software. There may be variances in spelling and  vocabulary which are unintentional. Not all errors are proofread. Please notify the dino if any discrepancies are noted or if the meaning of any statement is not clear.

## 2023-12-02 NOTE — Telephone Encounter (Signed)
 PA needed on Lyumjev

## 2023-12-03 ENCOUNTER — Other Ambulatory Visit: Payer: Self-pay | Admitting: Behavioral Health

## 2023-12-03 ENCOUNTER — Ambulatory Visit: Payer: BC Managed Care – PPO | Admitting: Professional Counselor

## 2023-12-03 ENCOUNTER — Encounter: Payer: Self-pay | Admitting: Professional Counselor

## 2023-12-03 DIAGNOSIS — F330 Major depressive disorder, recurrent, mild: Secondary | ICD-10-CM

## 2023-12-03 DIAGNOSIS — F411 Generalized anxiety disorder: Secondary | ICD-10-CM

## 2023-12-03 NOTE — Progress Notes (Signed)
      [facility] Counselor/Therapist Progress Note  Patient ID: TALISA PETRAK, MRN: [id number],    Date: 12/03/2023  Time Spent: 11:13 AM to 12:08 PM  Treatment Type: Individual Therapy  I connected with this patient by an approved telecommunication method (video), with her informed consent, and verifying identity and patient privacy.  I was located at my office and patient at her home.  As needed, we discussed the limitations, risks, and security and privacy concerns associated with telehealth service, including the availability and conditions which currently govern in-person appointments and the possibility that 3rd-party payment may not be fully guaranteed and she may be responsible for charges.  After she indicated understanding, we proceeded with the session.  Also discussed treatment planning, as needed, including ongoing verbal agreement with the plan, the opportunity to ask and answer all questions, her demonstrated understanding of instructions, and her readiness to call the office should symptoms worsen or she feels she is in a crisis state and needs more immediate and tangible assistance.   Reported Symptoms: Worries, restlessness, parenting concerns, stress, interpersonal concerns  Mental Status Exam:  Appearance:   Neat     Behavior:  Appropriate, Sharing, and Motivated  Motor:  Normal  Speech/Language:   Clear and Coherent and Normal Rate  Affect:  Appropriate and Congruent  Mood:  normal  Thought process:  normal  Thought content:    WNL  Sensory/Perceptual disturbances:    WNL  Orientation:  oriented to person, place, time/date, and situation  Attention:  Good  Concentration:  Good  Memory:  WNL  Fund of knowledge:   Good  Insight:    Good  Judgment:   Good  Impulse Control:  Good   Risk Assessment: Danger to Self:  No Self-injurious Behavior: No Danger to Others: No Duty to Warn:no Physical Aggression / Violence:No  Access to Firearms a concern: No  Gang  Involvement:No   Subjective: Patient presented to session to address concerns of anxiety and depression.  She reported progress.  She voiced having discussion with husband regarding need for increased share and responsibilities where parenting issues are concerned.  She also voiced progress in her work life, feeling safe and valued, and enjoying positive and healthy communications with colleagues.  She identified working to limit family screen time.  Counselor actively listened, affirmed patient resourcing and efforts.  Patient processed experience of imminent loss of family pet, her mother-in-law's health, her own health developments, and worries regarding one son and his behavior issues.  She reflected on the past and its implications on current behaviors, and counselor assisted patient in facilitating insight and helping with strategies towards improvement.  Interventions: Solution-Oriented/Positive Psychology, Humanistic/Existential, and Insight-Oriented  Diagnosis:   ICD-10-CM   1. Generalized anxiety disorder  F41.1     2. Major depressive disorder, recurrent episode, mild (HCC)  F33.0       Plan: Patient is scheduled for follow-up; continue process work and developing coping skills.  Patient short-term goal between sessions to look into reengaging therapy for 2 youngest children, and continuing to engage husband with shared parenting responsibilities.  Progress note was dictated with Dragon and reviewed for accuracy.  [name], [facility]

## 2023-12-06 ENCOUNTER — Other Ambulatory Visit (HOSPITAL_COMMUNITY): Payer: Self-pay

## 2023-12-09 ENCOUNTER — Telehealth: Payer: Self-pay | Admitting: Pharmacy Technician

## 2023-12-09 ENCOUNTER — Other Ambulatory Visit (HOSPITAL_COMMUNITY): Payer: Self-pay

## 2023-12-09 NOTE — Telephone Encounter (Signed)
Pharmacy Patient Advocate Encounter   Received notification from Pt Calls Messages that prior authorization for Lyumjev is required/requested.   Insurance verification completed.   The patient is insured through  [employer] [place] .   Per test claim:  Humalog, Admelog or generic humalog (insulin Lispro) is preferred by the insurance.  If suggested medication is appropriate, Please send in a new RX and discontinue this one. If not, please advise as to why it's not appropriate so that we may request a Prior Authorization. Please note, some preferred medications may still require a PA

## 2023-12-09 NOTE — Telephone Encounter (Signed)
PA request has been  Received . New Encounter created for follow up. For additional info see Pharmacy Prior Auth telephone encounter from 12/09/23.

## 2023-12-10 ENCOUNTER — Other Ambulatory Visit: Payer: Self-pay | Admitting: "Endocrinology

## 2023-12-10 MED ORDER — INSULIN LISPRO 100 UNIT/ML IJ SOLN: INTRAMUSCULAR | 3 refills | Status: AC

## 2023-12-17 ENCOUNTER — Ambulatory Visit: Payer: BC Managed Care – PPO | Admitting: Professional Counselor

## 2023-12-17 ENCOUNTER — Encounter: Payer: Self-pay | Admitting: Professional Counselor

## 2023-12-17 DIAGNOSIS — F330 Major depressive disorder, recurrent, mild: Secondary | ICD-10-CM

## 2023-12-17 DIAGNOSIS — F411 Generalized anxiety disorder: Secondary | ICD-10-CM | POA: Diagnosis not present

## 2023-12-17 NOTE — Progress Notes (Signed)
      [facility] Counselor/Therapist Progress Note  [name], MRN: [id number],    Date: 12/17/2023  Time Spent: 11:14 AM to 12:16 PM  Treatment Type: Individual Therapy  Reported Symptoms: Exhaustion, worries, fears, hypervigilance, stress, interpersonal concerns, parenting concerns  Mental Status Exam:  Appearance:   Neat     Behavior:  Appropriate, Sharing, and Motivated  Motor:  Normal  Speech/Language:   Clear and Coherent and Normal Rate  Affect:  Appropriate and Congruent  Mood:  normal  Thought process:  normal  Thought content:    WNL  Sensory/Perceptual disturbances:    WNL  Orientation:  oriented to person, place, time/date, and situation  Attention:  Good  Concentration:  Good  Memory:  WNL  Fund of knowledge:   Good  Insight:    Good  Judgment:   Good  Impulse Control:  Good   Risk Assessment: Danger to Self:  No Self-injurious Behavior: No Danger to Others: No Duty to Warn:no Physical Aggression / Violence:No  Access to Firearms a concern: No  Gang Involvement:No   Subjective: Patient presented to session to address concerns of anxiety and depression.  She reported mixed progress at this time.  She reported her youngest son to be doing well, however voiced ongoing preoccupation with fears and hypervigilance around her fears.  She processed her experience of strategies toward alleviating fear that work in the moment but do not resolve overall trauma response pattern.  Counselor helped facilitate insight around patient hypervigilance, and offered psychoeducation regarding trauma, toxic stress and allostatic load.  Counselor and patient discussed possibility of patient receiving accelerated response therapy or EMDR.  Patient voiced intention to consider these modalities.  Counselor and patient discussed ACEs screener, and patient found questions useful considering family trauma history particularly as relates adopted sons.  Patient processed  experience of leading her heart as a parent, and her steadfast devotion to her children.  Counselor and patient reviewed window of tolerance resources.  Interventions: Solution-Oriented/Positive Psychology, Humanistic/Existential, Psycho-education/Bibliotherapy, Insight-Oriented, and Resourcing  Diagnosis:   ICD-10-CM   1. Generalized anxiety disorder  F41.1     2. Major depressive disorder, recurrent episode, mild (HCC)  F33.0       Plan: Patient is scheduled for follow-up; continue process work and developing coping skills.  Patient to practice window of tolerance mindfulness and coping skills per resource provided as short-term goal between sessions.  Progress note was dictated with Dragon and reviewed for accuracy.  Gaspar Bidding, [facility]

## 2023-12-23 NOTE — Progress Notes (Signed)
Agree with assessment/plan. Pl obtain records from [facility] Recall colon at age 48  Edman Circle, MD [facility] [phone]

## 2023-12-31 ENCOUNTER — Encounter: Payer: Self-pay | Admitting: Professional Counselor

## 2023-12-31 ENCOUNTER — Ambulatory Visit (INDEPENDENT_AMBULATORY_CARE_PROVIDER_SITE_OTHER): Payer: BC Managed Care – PPO | Admitting: Professional Counselor

## 2023-12-31 DIAGNOSIS — F411 Generalized anxiety disorder: Secondary | ICD-10-CM

## 2023-12-31 DIAGNOSIS — F330 Major depressive disorder, recurrent, mild: Secondary | ICD-10-CM

## 2023-12-31 NOTE — Progress Notes (Signed)
      [facility] Counselor/Therapist Progress Note  [name], MRN: [id number],    Date: 12/31/2023  Time Spent: 11:13 AM to 12:12 PM  Treatment Type: Individual Therapy  Reported Symptoms: Restlessness, nervousness, worries, stress, interpersonal concerns, parenting concerns, health concerns   Mental Status Exam:  Appearance:   Neat     Behavior:  Appropriate, Sharing, and Motivated  Motor:  Normal  Speech/Language:   Clear and Coherent and Normal Rate  Affect:  Appropriate and Congruent  Mood:  normal  Thought process:  normal  Thought content:    WNL  Sensory/Perceptual disturbances:    WNL  Orientation:  oriented to person, place, time/date, and situation  Attention:  Good  Concentration:  Good  Memory:  WNL  Fund of knowledge:   Good  Insight:    Good  Judgment:   Good  Impulse Control:  Good   Risk Assessment: Danger to Self:  No Self-injurious Behavior: No Danger to Others: No Duty to Warn:no Physical Aggression / Violence:No  Access to Firearms a concern: No  Gang Involvement:No   Subjective: Patient presented to session to address concerns of anxiety and depression.  Patient reported progress.  She reported her husband to be more on board regarding parenting responsibilities and engagement with sons, and for this to help patient feel supported and to not need to over function as much in her parenting role.  She reflected on experience of having conversation with him as to her needs to this effect, and for the outcome to be favorable.  Counselor affirmed patient proactive conversation regarding her needs, and reinforced helpfulness of her getting support as much as possible.  Patient processed ongoing challenges in parenting sphere of life, and she and counselor discussed strategies for improved communications and circumstances.  Patient processed experience of management of her diabetes, and her low blood sugar that has become a considerable  strain in her life.  Counselor and patient discussed possible correlation of health condition and acute stress on a continuum, and ways for patient to increase self-care.  Interventions: Solution-Oriented/Positive Psychology, Humanistic/Existential, and Insight-Oriented  Diagnosis:   ICD-10-CM   1. Generalized anxiety disorder  F41.1     2. Major depressive disorder, recurrent episode, mild (HCC)  F33.0       Plan: Patient is scheduled for follow-up; continue process work and developing coping skills.  Patient to continue to prioritize self-care including family helping with responsibilities such as cooking, swimming, continuing to work on her health and diet, and husband maintaining supportive role in parenting.  Progress note was dictated with Dragon and reviewed for accuracy.  Mia Wilson, [facility]

## 2024-01-03 ENCOUNTER — Encounter: Payer: Self-pay | Admitting: Behavioral Health

## 2024-01-03 ENCOUNTER — Ambulatory Visit (INDEPENDENT_AMBULATORY_CARE_PROVIDER_SITE_OTHER): Payer: BC Managed Care – PPO | Admitting: Behavioral Health

## 2024-01-03 DIAGNOSIS — F411 Generalized anxiety disorder: Secondary | ICD-10-CM

## 2024-01-03 DIAGNOSIS — F902 Attention-deficit hyperactivity disorder, combined type: Secondary | ICD-10-CM

## 2024-01-03 DIAGNOSIS — F330 Major depressive disorder, recurrent, mild: Secondary | ICD-10-CM | POA: Diagnosis not present

## 2024-01-03 DIAGNOSIS — F331 Major depressive disorder, recurrent, moderate: Secondary | ICD-10-CM | POA: Diagnosis not present

## 2024-01-03 DIAGNOSIS — Z23 Encounter for immunization: Secondary | ICD-10-CM

## 2024-01-03 MED ORDER — ATOMOXETINE HCL 60 MG PO CAPS: 60.0000 mg | ORAL_CAPSULE | Freq: Every day | ORAL | 1 refills | Status: DC

## 2024-01-03 MED ORDER — AUVELITY 45-105 MG PO TBCR: EXTENDED_RELEASE_TABLET | ORAL | 3 refills | Status: DC

## 2024-01-03 MED ORDER — DULOXETINE HCL 60 MG PO CPEP: 60.0000 mg | ORAL_CAPSULE | Freq: Every day | ORAL | 1 refills | Status: DC

## 2024-01-03 NOTE — Progress Notes (Signed)
 [facility]  [name],  MRN: [id number]  PCP: Diamond Formica, PA  Date of Evaluation: 01/03/2024 Time spent:30 minutes  Chief Complaint:  Chief Complaint   Anxiety; Depression; ADHD; Follow-up; Patient Education; Medication Refill     HISTORY/CURRENT STATUS: HPI "Mia Wilson", 48 year old female presents to this office for follow up and medication management.  Collateral information should be considered reliable. She is more calm this visit and less talkative. Smiling and happy. Says she is doing well with depression but has not taken her Alena Hush in a very long time. She does not want to start slipping at work and would like to restart the drug. Says she is 80% improved and happy with her medication so far. Says the Auvelity  has "been a God send". Family life has improved and her youngest son has been doing much better. Requesting no changes this visit.  She reports her anxiety today 2/10, and depression at 2/10.  She denies history of mania, no psychosis, no auditory or visual hallucinations.  Denies SI or HI.   Past psychiatric medication trials: Zoloft Wellbutrin Lexapro Cymbalta  Strattera  Individual Medical History/ Review of Systems: Changes? :No   Allergies: Other, Strawberry extract, and Tomato  Current Medications:  Current Outpatient Medications:    atomoxetine  (STRATTERA ) 60 MG capsule, Take 1 capsule (60 mg total) by mouth daily., Disp: 30 capsule, Rfl: 1   atomoxetine  (STRATTERA ) 80 MG capsule, Take 80 mg by mouth every morning. (Patient not taking: Reported on 12/02/2023), Disp: , Rfl:    atorvastatin (LIPITOR) 10 MG tablet, Take 10 mg by mouth daily., Disp: , Rfl:    Blood Glucose Monitoring Suppl (ONETOUCH VERIO FLEX SYSTEM) w/Device KIT, CHECK BLOOD SUGAR BEFORE AND AFTER EACH MEAL, Disp: 1 kit, Rfl: 0   buPROPion (WELLBUTRIN XL) 150 MG 24 hr tablet, Take 300 mg by mouth every morning. (Patient not taking: Reported on 12/02/2023), Disp:  , Rfl:    Continuous Glucose Sensor (DEXCOM G6 SENSOR) MISC, USE AS INSTRUCTED CHANGE EVERY 10 DAYS, Disp: 9 each, Rfl: 3   Continuous Glucose Transmitter (DEXCOM G6 TRANSMITTER) MISC, AS DIRECTED CHANGE EVERY 90 DAYS, Disp: 1 each, Rfl: 3   dapagliflozin  propanediol (FARXIGA ) 10 MG TABS tablet, TAKE 1 TABLET BY MOUTH EVERY DAY BEFORE BREAKFAST (Patient not taking: Reported on 10/20/2023), Disp: 90 tablet, Rfl: 1   Dextromethorphan-buPROPion ER (AUVELITY ) 45-105 MG TBCR, Take one tablet by mouth two times daily at least 8 hours between doses, Disp: 60 tablet, Rfl: 3   docusate sodium  (COLACE) 100 MG capsule, Take 1 capsule (100 mg total) by mouth every 12 (twelve) hours. (Patient not taking: Reported on 12/02/2023), Disp: 60 capsule, Rfl: 0   DULoxetine  (CYMBALTA ) 60 MG capsule, Take 1 capsule (60 mg total) by mouth daily., Disp: 90 capsule, Rfl: 1   estradiol (CLIMARA - DOSED IN MG/24 HR) 0.0375 mg/24hr patch, Place 0.0375 mg onto the skin once a week., Disp: , Rfl:    Glucagon  (GVOKE HYPOPEN  1-PACK) 1 MG/0.2ML SOAJ, Inject 1 mg into the skin as needed (low blood sugar with impaired consciousness)., Disp: 0.4 mL, Rfl: 2   Insulin  Disposable Pump (OMNIPOD 5 G7 PODS, GEN 5,) MISC, Inject 1 Device into the skin every 3 (three) days. 1 DEVICE BY DOES NOT APPLY ROUTE EVERY 3 (THREE) DAYS., Disp: 30 each, Rfl: 2   insulin  glargine (LANTUS  SOLOSTAR) 100 UNIT/ML Solostar Pen, Inject 14 Units into the skin daily. In case of pump failure, Disp: 6 mL, Rfl: 1  insulin  lispro (HUMALOG ) 100 UNIT/ML injection, INJECT UPTO 50 UNITS INTO THE SKIN DAILY. USE MAX OF 50 UNITS DAILY VIA INSULIN  PUMP, Disp: 75 mL, Rfl: 3   Lancets (ONETOUCH DELICA PLUS LANCET30G) MISC, 1 each by Does not apply route See admin instructions. Use Onetouch Delica Plus lancets to check blood sugar before and after each meal., Disp: 200 each, Rfl: 3   Multiple Vitamin (MULTIVITAMIN WITH MINERALS) TABS, Take 1 tablet by mouth daily., Disp: , Rfl:     ONETOUCH VERIO test strip, USE ONETOUCH VERIO FLEX AS INSTRUCTED TO CHECK BLOOD SUGAR BEFORE AND AFTER EACH MEAL., Disp: 200 strip, Rfl: 3   polyethylene glycol powder (GLYCOLAX /MIRALAX ) 17 GM/SCOOP powder, Take 17 g by mouth 2 (two) times daily. Until daily soft stools OTC, Disp: 238 g, Rfl: 0   progesterone (PROMETRIUM) 100 MG capsule, , Disp: , Rfl:    vitamin B-12 (CYANOCOBALAMIN ) 1000 MCG tablet, Take 1,000 mcg by mouth daily., Disp: , Rfl:  Medication Side Effects: none  Family Medical/ Social History: Changes? No  MENTAL HEALTH EXAM:  Last menstrual period 11/10/2018.There is no height or weight on file to calculate BMI.  General Appearance: Casual  Eye Contact:  Good  Speech:  Clear and Coherent  Volume:  Normal  Mood:  NA  Affect:  Appropriate  Thought Process:  Coherent  Orientation:  Full (Time, Place, and Person)  Thought Content: Logical   Suicidal Thoughts:  No  Homicidal Thoughts:  No  Memory:  WNL  Judgement:  Good  Insight:  Good  Psychomotor Activity:  Normal  Concentration:  Concentration: Good  Recall:  Good  Fund of Knowledge: Good  Language: Good  Assets:  Desire for Improvement  ADL's:  Intact  Cognition: WNL  Prognosis:  Good    DIAGNOSES:    ICD-10-CM   1. Major depressive disorder, recurrent episode, mild (HCC)  F33.0 Dextromethorphan-buPROPion ER (AUVELITY ) 45-105 MG TBCR    2. Generalized anxiety disorder  F41.1     3. Major depressive disorder, recurrent episode, moderate (HCC)  F33.1     4. Attention deficit hyperactivity disorder (ADHD), combined type  F90.2 atomoxetine  (STRATTERA ) 60 MG capsule    5. Need for prophylactic vaccination and inoculation against influenza  Z23 DULoxetine  (CYMBALTA ) 60 MG capsule      Receiving Psychotherapy: No    RECOMMENDATIONS:   Greater than 50% of 30 min face to face time with patient was spent on counseling and coordination of care. Discussed her significant improvement with Auvelity . Talked  about her desire to re initiate Staterra for attentiion and focus. We also discussed changes in social dynamics with her family. Her work life and performance has improved recently.  We agreed today to: Will start Auvelity  45-105 mg for 3 days, then 90-210 mg daily.  Will continue Wellbutrin 300 mg daily Will continue Cymbalta  90 mg daily Restart  taking Strattera  60  mg daily To follow-up in 8 weeks to reassess Will report worsening symptoms or side effects promptly Provided emergency contact information Discussed potential metabolic side effects associated with atypical antipsychotics, as well as potential risk for movement side effects. Advised pt to contact office if movement side effects occur.   Reviewed PDMP   Lincoln Renshaw, NP

## 2024-01-05 ENCOUNTER — Ambulatory Visit: Payer: BC Managed Care – PPO | Admitting: "Endocrinology

## 2024-01-14 ENCOUNTER — Ambulatory Visit: Payer: BC Managed Care – PPO | Admitting: Professional Counselor

## 2024-01-17 ENCOUNTER — Encounter: Payer: Self-pay | Admitting: Gastroenterology

## 2024-01-17 ENCOUNTER — Ambulatory Visit: Payer: BC Managed Care – PPO | Admitting: Gastroenterology

## 2024-01-17 VITALS — BP 108/70 | HR 77 | Ht 65.5 in | Wt 168.8 lb

## 2024-01-17 DIAGNOSIS — R1013 Epigastric pain: Secondary | ICD-10-CM | POA: Diagnosis not present

## 2024-01-17 DIAGNOSIS — K5909 Other constipation: Secondary | ICD-10-CM

## 2024-01-17 DIAGNOSIS — R110 Nausea: Secondary | ICD-10-CM

## 2024-01-17 DIAGNOSIS — K5900 Constipation, unspecified: Secondary | ICD-10-CM

## 2024-01-17 DIAGNOSIS — R142 Eructation: Secondary | ICD-10-CM | POA: Diagnosis not present

## 2024-01-17 DIAGNOSIS — K2100 Gastro-esophageal reflux disease with esophagitis, without bleeding: Secondary | ICD-10-CM

## 2024-01-17 DIAGNOSIS — E139 Other specified diabetes mellitus without complications: Secondary | ICD-10-CM

## 2024-01-17 MED ORDER — PANTOPRAZOLE SODIUM 40 MG PO TBEC: 40.0000 mg | DELAYED_RELEASE_TABLET | Freq: Every day | ORAL | 2 refills | Status: DC

## 2024-01-17 NOTE — Patient Instructions (Addendum)
 A referral was place for Nutritionist the will contact you to scheduled a appointment  We have sent the following medications to your pharmacy for you to pick up at your convenience: Pantoprazole      If your blood pressure at your visit was 140/90 or greater, please contact your primary care physician to follow up on this.  _______________________________________________________  If you are age 50 or older, your body mass index should be between 23-30. Your Body mass index is 27.66 kg/m. If this is out of the aforementioned range listed, please consider follow up with your Primary Care Provider.  If you are age 71 or younger, your body mass index should be between 19-25. Your Body mass index is 27.66 kg/m. If this is out of the aformentioned range listed, please consider follow up with your Primary Care Provider.   ________________________________________________________  The [facility] providers would like to encourage you to use [facility] to communicate with providers for non-urgent requests or questions.  Due to long hold times on the telephone, sending your provider a message by [facility] may be a faster and more efficient way to get a response.  Please allow 48 business hours for a response.  Please remember that this is for non-urgent requests.  _______________________________________________________  Thank you for entrusting me with your care and choosing [facility].  Bayley Leanna Sato, PA-C

## 2024-01-17 NOTE — Progress Notes (Signed)
 Chief Complaint: follow up Primary GI MD: Dr. Chales Abrahams  HPI: 48 year old female history of anxiety, depression, PCOS, diabetes, presents for follow-up of count to patient.  Last seen December 2020 for for constipation and associated abdominal cramping.  She was given Linzess for "cleanout" and then recommended to be maintained on MiraLAX.  She also had a syncopal episode while in a restaurant due to giving herself too much insulin.  Today she states she is doing better.  She completed the Linzess cleanout and was doing well on MiraLAX on a daily basis.  However, her mother-in-law recently passed away which made it difficult for her to adhere to [facility] but she is restarting it and seeing results.  She does note nausea in the mornings with some eructation and epigastric discomfort.  So much so that she has difficulty wearing a bra.  Denies NSAID use.  She is not on any antacids.     PREVIOUS GI WORKUP   She reports having a colonoscopy 4 years ago with [facility] and states she had no polyps. She was given a recall 5 years due to family history of colon polyps in her father   CT abdomen pelvis with contrast 11/11/2023 - Moderate colonic stool burden - Hepatic steatosis - Uterine fibroids - Normal gallbladder    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)    Fibroids    PCOS (polycystic ovarian syndrome)     Past Surgical History:  Procedure Laterality Date   BREAST EXCISIONAL BIOPSY Right    BREAST SURGERY     benign   IR ANGIOGRAM PELVIS SELECTIVE OR SUPRASELECTIVE  11/17/2018   IR ANGIOGRAM PELVIS SELECTIVE OR SUPRASELECTIVE  11/17/2018   IR ANGIOGRAM SELECTIVE EACH ADDITIONAL VESSEL  11/17/2018   IR ANGIOGRAM SELECTIVE EACH ADDITIONAL VESSEL  11/17/2018   IR EMBO TUMOR ORGAN ISCHEMIA INFARCT INC GUIDE ROADMAPPING  11/17/2018   IR RADIOLOGIST EVAL & MGMT  09/14/2018   IR RADIOLOGIST EVAL & MGMT  12/13/2018   IR RADIOLOGIST EVAL &  MGMT  06/28/2019   IR US GUIDE VASC ACCESS RIGHT  11/17/2018   UTERINE FIBROID SURGERY      Current Outpatient Medications  Medication Sig Dispense Refill   atomoxetine (STRATTERA) 60 MG capsule Take 1 capsule (60 mg total) by mouth daily. 30 capsule 1   atorvastatin (LIPITOR) 10 MG tablet Take 10 mg by mouth daily.     Blood Glucose Monitoring Suppl (ONETOUCH VERIO FLEX SYSTEM) w/Device KIT CHECK BLOOD SUGAR BEFORE AND AFTER EACH MEAL 1 kit 0   Continuous Glucose Sensor (DEXCOM G6 SENSOR) MISC USE AS INSTRUCTED CHANGE EVERY 10 DAYS 9 each 3   Continuous Glucose Transmitter (DEXCOM G6 TRANSMITTER) MISC AS DIRECTED CHANGE EVERY 90 DAYS 1 each 3   Dextromethorphan-buPROPion ER (AUVELITY) 45-105 MG TBCR Take one tablet by mouth two times daily at least 8 hours between doses 60 tablet 3   DULoxetine (CYMBALTA) 60 MG capsule Take 1 capsule (60 mg total) by mouth daily. 90 capsule 1   estradiol (CLIMARA - DOSED IN MG/24 HR) 0.0375 mg/24hr patch Place 0.0375 mg onto the skin once a week.     Glucagon (GVOKE HYPOPEN 1-PACK) 1 MG/0.2ML SOAJ Inject 1 mg into the skin as needed (low blood sugar with impaired consciousness). 0.4 mL 2   Insulin Disposable Pump (OMNIPOD 5 G7 PODS, GEN 5,) MISC Inject 1 Device into the skin every 3 (three) days. 1  DEVICE BY DOES NOT APPLY ROUTE EVERY 3 (THREE) DAYS. 30 each 2   insulin lispro (HUMALOG) 100 UNIT/ML injection INJECT UPTO 50 UNITS INTO THE SKIN DAILY. USE MAX OF 50 UNITS DAILY VIA INSULIN PUMP 75 mL 3   Lancets (ONETOUCH DELICA PLUS LANCET30G) MISC 1 each by Does not apply route See admin instructions. Use Onetouch Delica Plus lancets to check blood sugar before and after each meal. 200 each 3   Multiple Vitamin (MULTIVITAMIN WITH MINERALS) TABS Take 1 tablet by mouth daily.     ONETOUCH VERIO test strip USE ONETOUCH VERIO FLEX AS INSTRUCTED TO CHECK BLOOD SUGAR BEFORE AND AFTER EACH MEAL. 200 strip 3   pantoprazole (PROTONIX) 40 MG tablet Take 1 tablet (40 mg  total) by mouth daily. 30 tablet 2   progesterone (PROMETRIUM) 100 MG capsule      vitamin B-12 (CYANOCOBALAMIN) 1000 MCG tablet Take 1,000 mcg by mouth daily.     insulin glargine (LANTUS SOLOSTAR) 100 UNIT/ML Solostar Pen Inject 14 Units into the skin daily. In case of pump failure 6 mL 1   polyethylene glycol powder (GLYCOLAX/MIRALAX) 17 GM/SCOOP powder Take 17 g by mouth 2 (two) times daily. Until daily soft stools OTC (Patient not taking: Reported on 01/17/2024) 238 g 0   No current facility-administered medications for this visit.    Allergies as of 01/17/2024 - Review Complete 01/17/2024  Allergen Reaction Noted   Other Anaphylaxis 04/08/2016   Strawberry extract Hives 11/08/2013   Tomato Hives 11/08/2013    Family History  Problem Relation Age of Onset   Hyperlipidemia Other    Hypertension Other    Stroke Other    Cancer Other    Diabetes Maternal Uncle    Heart disease Paternal Uncle    Diabetes Maternal Grandmother    Heart disease Maternal Grandmother    Breast cancer Maternal Grandmother        diagnosed in her 83's   Diabetes Paternal Grandmother     Social History   Socioeconomic History   Marital status: Married    Spouse name: Not on file   Number of children: 4   Years of education: Not on file   Highest education level: Not on file  Occupational History   Occupation: research admn.  Tobacco Use   Smoking status: Never   Smokeless tobacco: Never  Vaping Use   Vaping status: Never Used  Substance and Sexual Activity   Alcohol use: No   Drug use: No   Sexual activity: Yes  Other Topics Concern   Not on file  Social History Narrative   Living in [place] with husband and two adopted children 15 and 14. Currently on FMLA.  She is not employed at this time. She has been her 2 youngest adopted children at home. Not involved a lot else here at this time. She has 2 other children also. She walks a little bit but does not do a lot since she has left her  with fatigue.   Social Drivers of Corporate investment banker Strain: Not on file  Food Insecurity: Not on file  Transportation Needs: Not on file  Physical Activity: Not on file  Stress: Stress Concern Present (05/19/2023)   [employer] of Occupational Health - Occupational Stress Questionnaire    Feeling of Stress : Rather much  Social Connections: Unknown (05/19/2023)   Social Connection and Isolation Panel [NHANES]    Frequency of Communication with Friends and Family: More than three times  a week    Frequency of Social Gatherings with Friends and Family: More than three times a week    Attends Religious Services: More than 4 times per year    Active Member of [employer] or Organizations: Not on file    Attends Banker Meetings: Not on file    Marital Status: Married  Intimate Partner Violence: Unknown (02/26/2022)   Received from [employer], [facility]   HITS    Physically Hurt: Not on file    Insult or Talk Down To: Not on file    Threaten Physical Harm: Not on file    Scream or Curse: Not on file    Review of Systems:    Constitutional: No weight loss, fever, chills, weakness or fatigue HEENT: Eyes: No change in vision               Ears, Nose, Throat:  No change in hearing or congestion Skin: No rash or itching Cardiovascular: No chest pain, chest pressure or palpitations   Respiratory: No SOB or cough Gastrointestinal: See HPI and otherwise negative Genitourinary: No dysuria or change in urinary frequency Neurological: No headache, dizziness or syncope Musculoskeletal: No new muscle or joint pain Hematologic: No bleeding or bruising Psychiatric: No history of depression or anxiety    Physical Exam:  Vital signs: BP 108/70 (BP Location: Left Arm, Patient Position: Sitting, Cuff Size: Normal)   Pulse 77   Ht 5' 5.5" (1.664 m)   Wt 168 lb 12.8 oz (76.6 kg)   LMP 11/10/2018 (Approximate)   BMI 27.66 kg/m   Constitutional: NAD, Well developed,  Well nourished, alert and cooperative Head:  Normocephalic and atraumatic. Eyes:   PEERL, EOMI. No icterus. Conjunctiva pink. Respiratory: Respirations even and unlabored. Lungs clear to auscultation bilaterally.   No wheezes, crackles, or rhonchi.  Cardiovascular:  Regular rate and rhythm. No peripheral edema, cyanosis or pallor.  Gastrointestinal:  Soft, nondistended, nontender. No rebound or guarding. Normal bowel sounds. No appreciable masses or hepatomegaly. Rectal:  Not performed.  Msk:  Symmetrical without gross deformities. Without edema, no deformity or joint abnormality.  Neurologic:  Alert and  oriented x4;  grossly normal neurologically.  Skin:   Dry and intact without significant lesions or rashes. Psychiatric: Oriented to person, place and time. Demonstrates good judgement and reason without abnormal affect or behaviors.   RELEVANT LABS AND IMAGING: CBC    Component Value Date/Time   WBC 7.0 11/15/2023 1609   RBC 4.48 11/15/2023 1609   HGB 11.8 (L) 11/15/2023 1609   HCT 37.5 11/15/2023 1609   PLT 259 11/15/2023 1609   MCV 83.7 11/15/2023 1609   MCV 78.0 (A) 04/30/2012 1351   MCH 26.3 11/15/2023 1609   MCHC 31.5 11/15/2023 1609   RDW 13.5 11/15/2023 1609   LYMPHSABS 1.1 11/15/2023 1609   MONOABS 0.6 11/15/2023 1609   EOSABS 0.0 11/15/2023 1609   BASOSABS 0.0 11/15/2023 1609    CMP     Component Value Date/Time   NA 140 11/18/2023 1049   K 4.4 11/18/2023 1049   CL 103 11/18/2023 1049   CO2 24 11/18/2023 1049   GLUCOSE 212 (H) 11/18/2023 1049   BUN 11 11/18/2023 1049   CREATININE 0.71 11/18/2023 1049   CALCIUM 9.3 11/18/2023 1049   PROT 6.4 (L) 11/15/2023 1609   ALBUMIN 3.6 11/15/2023 1609   AST 29 11/15/2023 1609   ALT 17 11/15/2023 1609   ALKPHOS 89 11/15/2023 1609   BILITOT 0.6 11/15/2023 1609  GFRNONAA >60 11/15/2023 1609   GFRAA >60 11/17/2018 0758     Assessment/Plan:   Chronic constipation Lower abdominal cramping Improved on MiraLAX as  long she remains compliant which has been difficult due to the recent passing of her mother-in-law.  Reportedly had colonoscopy 4 years ago with [facility], we are waiting on those records.  She would be due for repeat next year.  She would like to hold off on colonoscopy for now. - Continue MiraLAX.  Can increase to 2 capfuls if needed. - Add squatty potty - Continue increase fiber increase water and increase exercise  Epigastric discomfort Nausea Eructation Dyspeptic symptoms mainly in the morning currently not on any antacid.  She is under a lot of stress.  Denies NSAIDs.  Offered EGD and patient politely declined and prefers conservative management. - Trial of pantoprazole 40 Mg once daily - Educated patient on lifestyle modifications and provided GERD education handouts - If no improvement on PPI consider EGD - Follow-up 6 to 8 weeks  Diabetes Patient reports she has been struggling with her diabetes and insulin in regard to her diet.  She has been seen by nutritionist in the past and would like to go back for multiple reasons including GERD, constipation, and diabetes - Refer to nutritionist    Boone Master, PA-C [facility] 01/17/2024, 10:24 AM  Cc: Milus Height, PA

## 2024-01-20 ENCOUNTER — Telehealth: Payer: Self-pay | Admitting: Gastroenterology

## 2024-01-20 ENCOUNTER — Other Ambulatory Visit (HOSPITAL_COMMUNITY): Payer: Self-pay

## 2024-01-20 NOTE — Telephone Encounter (Signed)
 Inbound call from patient, states insurance requires prior authorization for Pantoprazole.

## 2024-01-21 ENCOUNTER — Other Ambulatory Visit (HOSPITAL_COMMUNITY): Payer: Self-pay

## 2024-01-21 NOTE — Telephone Encounter (Signed)
 Pharmacy Patient Advocate Encounter   Received notification from Patient Pharmacy that prior authorization for Pantoprazole Sodium 40 mg tablets is required/requested.   Insurance verification completed.   The patient is insured through [employer] .   Per test claim: PA required; PA submitted to above mentioned insurance via CoverMyMeds Key/confirmation #/EOC [id number] Status is pending

## 2024-01-21 NOTE — Telephone Encounter (Signed)
 Pharmacy Patient Advocate Encounter  Received notification from [facility] that Prior Authorization for Pantoprazole 40 mg tablets has been DENIED.  Full denial letter will be uploaded to the media tab. See denial reason below.   PA #/Case ID/Reference #: [id number]

## 2024-01-21 NOTE — Telephone Encounter (Signed)
 Bayley, please see info below.  Would you like me to have patient purchase pantoprazole using goodrx (28 dollars monthly) or would you like to try for another PPI that her insurance may cover?

## 2024-01-21 NOTE — Telephone Encounter (Signed)
 Left message for patient to call back

## 2024-01-24 ENCOUNTER — Other Ambulatory Visit: Payer: Self-pay | Admitting: Nurse Practitioner

## 2024-01-24 ENCOUNTER — Other Ambulatory Visit (HOSPITAL_COMMUNITY)
Admission: RE | Admit: 2024-01-24 | Discharge: 2024-01-24 | Disposition: A | Discharge status: Home / Self Care | Source: Ambulatory Visit | Attending: Nurse Practitioner | Admitting: Nurse Practitioner

## 2024-01-24 DIAGNOSIS — N958 Other specified menopausal and perimenopausal disorders: Secondary | ICD-10-CM | POA: Diagnosis not present

## 2024-01-24 DIAGNOSIS — Z124 Encounter for screening for malignant neoplasm of cervix: Secondary | ICD-10-CM | POA: Diagnosis not present

## 2024-01-24 DIAGNOSIS — Z01419 Encounter for gynecological examination (general) (routine) without abnormal findings: Secondary | ICD-10-CM | POA: Diagnosis not present

## 2024-01-24 DIAGNOSIS — N951 Menopausal and female climacteric states: Secondary | ICD-10-CM | POA: Diagnosis not present

## 2024-01-24 NOTE — Telephone Encounter (Signed)
 Discussed insurance denial for pantoprazole with patient. She prefers to use GoodRx to go ahead and purchase the pantoprazole at 28 dollars per month out of pocket. I have advised patient to let me know if she has any additional problems getting the medication.

## 2024-01-27 ENCOUNTER — Ambulatory Visit
Admission: RE | Admit: 2024-01-27 | Discharge: 2024-01-27 | Disposition: A | Discharge status: Home / Self Care | Payer: BC Managed Care – PPO | Source: Ambulatory Visit | Attending: Physician Assistant | Admitting: Physician Assistant

## 2024-01-27 ENCOUNTER — Ambulatory Visit: Admitting: "Endocrinology

## 2024-01-27 ENCOUNTER — Encounter: Payer: Self-pay | Admitting: "Endocrinology

## 2024-01-27 VITALS — BP 130/80 | HR 81 | Ht 65.5 in | Wt 173.2 lb

## 2024-01-27 DIAGNOSIS — N6321 Unspecified lump in the left breast, upper outer quadrant: Secondary | ICD-10-CM | POA: Diagnosis not present

## 2024-01-27 DIAGNOSIS — N6002 Solitary cyst of left breast: Secondary | ICD-10-CM

## 2024-01-27 DIAGNOSIS — Z9641 Presence of insulin pump (external) (internal): Secondary | ICD-10-CM | POA: Diagnosis not present

## 2024-01-27 DIAGNOSIS — E1065 Type 1 diabetes mellitus with hyperglycemia: Secondary | ICD-10-CM

## 2024-01-27 DIAGNOSIS — N6320 Unspecified lump in the left breast, unspecified quadrant: Secondary | ICD-10-CM

## 2024-01-27 DIAGNOSIS — E7800 Pure hypercholesterolemia, unspecified: Secondary | ICD-10-CM | POA: Diagnosis not present

## 2024-01-27 LAB — POCT GLYCOSYLATED HEMOGLOBIN (HGB A1C): Hemoglobin A1C: 7.6 % — AB (ref 4.0–5.6)

## 2024-01-27 LAB — CYTOLOGY - PAP
Comment: NEGATIVE
Diagnosis: NEGATIVE
High risk HPV: NEGATIVE

## 2024-01-27 NOTE — Patient Instructions (Signed)

## 2024-01-27 NOTE — Progress Notes (Signed)
 Outpatient Endocrinology Note Mia Economy, MD  01/27/24   Mia Wilson [birth year] [id number]  Referring Provider: Milus Height, PA Primary Care Provider: Milus Height, PA Reason for consultation: Subjective   Assessment & Plan  Diagnoses and all orders for this visit:  Uncontrolled type 1 diabetes mellitus with hyperglycemia (HCC) -     POCT glycosylated hemoglobin (Hb A1C)  Insulin pump in place  Pure hypercholesterolemia   Diabetes Type I complicated by hyperglycemia C-pep 0.5, BG 212, Insulin Ab + Lab Results  Component Value Date   GFR 92.07 10/13/2023   Hba1c goal less than 7, current Hba1c is  Lab Results  Component Value Date   HGBA1C 7.6 (A) 01/27/2024   Will recommend the following: OMNIPOD 5 insulin pump BASAL RATES: Midnight = 0.3, 5 AM = 0.45, 9 AM = 0.3 and 7 PM = 0. 45 Bolus settings: I/C ratio 10 (5pm-12pm: 8) ISF: 60, Target: 110, with correction over 150.  Insulin: Currently using Humalog  Stopped farxiga  No known contraindications/side effects to any of above medications Glucagon discussed and prescribed with refills on 10/20/23  -Last LD and Tg are as follows: Lab Results  Component Value Date   LDLCALC 107 (H) 10/13/2023    Lab Results  Component Value Date   TRIG 44.0 10/13/2023   -On atorvastatin 10 mg QD -Follow low fat diet and exercise   -Blood pressure goal <140/90 - Microalbumin/creatinine goal is < 30 -Last MA/Cr is as follows: Lab Results  Component Value Date   MICROALBUR <0.7 10/13/2023   -not on ACE/ARB  -diet changes including salt restriction -limit eating outside -counseled BP targets per standards of diabetes care -uncontrolled blood pressure can lead to retinopathy, nephropathy and cardiovascular and atherosclerotic heart disease  Reviewed and counseled on: -A1C target -Blood sugar targets -Complications of uncontrolled diabetes  -Checking blood sugar before meals and bedtime and bring  log next visit -All medications with mechanism of action and side effects -Hypoglycemia management: rule of 15's, Glucagon Emergency Kit and medical alert ID -low-carb low-fat plate-method diet -At least 20 minutes of physical activity per day -Annual dilated retinal eye exam and foot exam -compliance and follow up needs -follow up as scheduled or earlier if problem gets worse  Call if blood sugar is less than 70 or consistently above 250    Take a 15 gm snack of carbohydrate at bedtime before you go to sleep if your blood sugar is less than 100.    If you are going to fast after midnight for a test or procedure, ask your physician for instructions on how to reduce/decrease your insulin dose.    Call if blood sugar is less than 70 or consistently above 250  -Treating a low sugar by rule of 15  (15 gms of sugar every 15 min until sugar is more than 70) If you feel your sugar is low, test your sugar to be sure If your sugar is low (less than 70), then take 15 grams of a fast acting Carbohydrate (3-4 glucose tablets or glucose gel or 4 ounces of juice or regular soda) Recheck your sugar 15 min after treating low to make sure it is more than 70 If sugar is still less than 70, treat again with 15 grams of carbohydrate          Don't drive the hour of hypoglycemia  If unconscious/unable to eat or drink by mouth, use glucagon injection or nasal spray baqsimi and call 911.  Can repeat again in 15 min if still unconscious.  Return in about 3 months (around 04/28/2024).   I have reviewed current medications, nurse's notes, allergies, vital signs, past medical and surgical history, family medical history, and social history for this encounter. Counseled patient on symptoms, examination findings, lab findings, imaging results, treatment decisions and monitoring and prognosis. The patient understood the recommendations and agrees with the treatment plan. All questions regarding treatment plan were fully  answered.  Mia Wilson, MD  01/27/24   History of Present Illness Mia Wilson is a 48 y.o. year old female who presents for evaluation of Type II diabetes mellitus.  Mia Wilson was first diagnosed in 2009.   Diabetes education +  Diagnosis: date of diagnosis: 2009, initially asymptomatic  RECENT history:   Current treatment regimen:  OMNIPOD 5 insulin pump BASAL RATES: Midnight = 0.3, 5 AM = 0.45, 9 AM = 0.3 and 7 PM = 0. 45 Bolus settings: I/C ratio 10 (5pm-12pm: 9) ISF: 60, Target: 110, with correction over 150.  Insulin: Currently using Humalog  Non-insulin hypoglycemic drugs: stopped Farxiga 10 mg daily  COMPLICATIONS -  MI/Stroke -  retinopathy -  neuropathy -  nephropathy  BLOOD SUGAR DATA  CGM interpretation: At today's visit, we reviewed her CGM downloads. The full report is scanned in the media. Reviewing the CGM trends, BG are mildly elevated after dinner>after lunch.    Physical Exam  BP 130/80 (BP Location: Right Arm, Patient Position: Sitting)   Pulse 81   Ht 5' 5.5" (1.664 m)   Wt 173 lb 3.2 oz (78.6 kg)   LMP 11/10/2018 (Approximate)   SpO2 97%   BMI 28.38 kg/m    Constitutional: well developed, well nourished Head: normocephalic, atraumatic Eyes: sclera anicteric, no redness Neck: supple Lungs: normal respiratory effort Neurology: alert and oriented Skin: dry, no appreciable rashes Musculoskeletal: no appreciable defects Psychiatric: normal mood and affect Diabetic Foot Exam - Simple   No data filed      Current Medications Patient's Medications  New Prescriptions   No medications on file  Previous Medications   ATOMOXETINE (STRATTERA) 60 MG CAPSULE    Take 1 capsule (60 mg total) by mouth daily.   ATORVASTATIN (LIPITOR) 10 MG TABLET    Take 10 mg by mouth daily.   BLOOD GLUCOSE MONITORING SUPPL (ONETOUCH VERIO FLEX SYSTEM) W/DEVICE KIT    CHECK BLOOD SUGAR BEFORE AND AFTER EACH MEAL   CONTINUOUS GLUCOSE SENSOR  (DEXCOM G6 SENSOR) MISC    USE AS INSTRUCTED CHANGE EVERY 10 DAYS   CONTINUOUS GLUCOSE TRANSMITTER (DEXCOM G6 TRANSMITTER) MISC    AS DIRECTED CHANGE EVERY 90 DAYS   DEXTROMETHORPHAN-BUPROPION ER (AUVELITY) 45-105 MG TBCR    Take one tablet by mouth two times daily at least 8 hours between doses   DULOXETINE (CYMBALTA) 60 MG CAPSULE    Take 1 capsule (60 mg total) by mouth daily.   ESTRADIOL (CLIMARA - DOSED IN MG/24 HR) 0.0375 MG/24HR PATCH    Place 0.0375 mg onto the skin once a week.   GLUCAGON (GVOKE HYPOPEN 1-PACK) 1 MG/0.2ML SOAJ    Inject 1 mg into the skin as needed (low blood sugar with impaired consciousness).   INSULIN DISPOSABLE PUMP (OMNIPOD 5 G7 PODS, GEN 5,) MISC    Inject 1 Device into the skin every 3 (three) days. 1 DEVICE BY DOES NOT APPLY ROUTE EVERY 3 (THREE) DAYS.   INSULIN GLARGINE (LANTUS SOLOSTAR) 100 UNIT/ML SOLOSTAR PEN  Inject 14 Units into the skin daily. In case of pump failure   INSULIN LISPRO (HUMALOG) 100 UNIT/ML INJECTION    INJECT UPTO 50 UNITS INTO THE SKIN DAILY. USE MAX OF 50 UNITS DAILY VIA INSULIN PUMP   LANCETS (ONETOUCH DELICA PLUS LANCET30G) MISC    1 each by Does not apply route See admin instructions. Use Onetouch Delica Plus lancets to check blood sugar before and after each meal.   MULTIPLE VITAMIN (MULTIVITAMIN WITH MINERALS) TABS    Take 1 tablet by mouth daily.   ONETOUCH VERIO TEST STRIP    USE ONETOUCH VERIO FLEX AS INSTRUCTED TO CHECK BLOOD SUGAR BEFORE AND AFTER EACH MEAL.   PANTOPRAZOLE (PROTONIX) 40 MG TABLET    Take 1 tablet (40 mg total) by mouth daily.   POLYETHYLENE GLYCOL POWDER (GLYCOLAX/MIRALAX) 17 GM/SCOOP POWDER    Take 17 g by mouth 2 (two) times daily. Until daily soft stools OTC   PROGESTERONE (PROMETRIUM) 100 MG CAPSULE       VITAMIN B-12 (CYANOCOBALAMIN) 1000 MCG TABLET    Take 1,000 mcg by mouth daily.  Modified Medications   No medications on file  Discontinued Medications   No medications on file    Allergies Allergies   Allergen Reactions   Other Anaphylaxis    Peaches , tomatoes, strawberry   Strawberry Extract Hives    Facial hives and mouth hives   Tomato Hives    Facial hives and mouth hives    Past Medical History 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)    Fibroids    PCOS (polycystic ovarian syndrome)     Past Surgical History Past Surgical History:  Procedure Laterality Date   BREAST EXCISIONAL BIOPSY Right    BREAST SURGERY     benign   IR ANGIOGRAM PELVIS SELECTIVE OR SUPRASELECTIVE  11/17/2018   IR ANGIOGRAM PELVIS SELECTIVE OR SUPRASELECTIVE  11/17/2018   IR ANGIOGRAM SELECTIVE EACH ADDITIONAL VESSEL  11/17/2018   IR ANGIOGRAM SELECTIVE EACH ADDITIONAL VESSEL  11/17/2018   IR EMBO TUMOR ORGAN ISCHEMIA INFARCT INC GUIDE ROADMAPPING  11/17/2018   IR RADIOLOGIST EVAL & MGMT  09/14/2018   IR RADIOLOGIST EVAL & MGMT  12/13/2018   IR RADIOLOGIST EVAL & MGMT  06/28/2019   IR US GUIDE VASC ACCESS RIGHT  11/17/2018   UTERINE FIBROID SURGERY      Family History family history includes Breast cancer in her maternal grandmother; Cancer in an other family member; Diabetes in her maternal grandmother, maternal uncle, and paternal grandmother; Heart disease in her maternal grandmother and paternal uncle; Hyperlipidemia in an other family member; Hypertension in an other family member; Stroke in an other family member.  Social History Social History   Socioeconomic History   Marital status: Married    Spouse name: Not on file   Number of children: 4   Years of education: Not on file   Highest education level: Not on file  Occupational History   Occupation: research admn.  Tobacco Use   Smoking status: Never   Smokeless tobacco: Never  Vaping Use   Vaping status: Never Used  Substance and Sexual Activity   Alcohol use: No   Drug use: No   Sexual activity: Yes  Other Topics Concern   Not on file  Social History Narrative   Living in  [place] with husband and two adopted children 15 and 14. Currently on FMLA.  She is not  employed at this time. She has been her 2 youngest adopted children at home. Not involved a lot else here at this time. She has 2 other children also. She walks a little bit but does not do a lot since she has left her with fatigue.   Social Drivers of Corporate investment banker Strain: Not on file  Food Insecurity: Not on file  Transportation Needs: Not on file  Physical Activity: Not on file  Stress: Stress Concern Present (05/19/2023)   [employer] of Occupational Health - Occupational Stress Questionnaire    Feeling of Stress : Rather much  Social Connections: Unknown (05/19/2023)   Social Connection and Isolation Panel [NHANES]    Frequency of Communication with Friends and Family: More than three times a week    Frequency of Social Gatherings with Friends and Family: More than three times a week    Attends Religious Services: More than 4 times per year    Active Member of Clubs or Organizations: Not on file    Attends Banker Meetings: Not on file    Marital Status: Married  Intimate Partner Violence: Unknown (02/26/2022)   [facility] from [facility]   HITS    Physically Hurt: Not on file    Insult or Talk Down To: Not on file    Threaten Physical Harm: Not on file    Scream or Curse: Not on file    Lab Results  Component Value Date   HGBA1C 7.6 (A) 01/27/2024   HGBA1C 8.6 (H) 10/13/2023   HGBA1C 7.3 (H) 04/30/2023   Lab Results  Component Value Date   CHOL 206 (H) 10/13/2023   Lab Results  Component Value Date   HDL 89.60 10/13/2023   Lab Results  Component Value Date   LDLCALC 107 (H) 10/13/2023   Lab Results  Component Value Date   TRIG 44.0 10/13/2023   Lab Results  Component Value Date   CHOLHDL 2 10/13/2023   Lab Results  Component Value Date   CREATININE 0.71 11/18/2023   Lab Results  Component Value Date   GFR 92.07  10/13/2023   Lab Results  Component Value Date   MICROALBUR <0.7 10/13/2023      Component Value Date/Time   NA 140 11/18/2023 1049   K 4.4 11/18/2023 1049   CL 103 11/18/2023 1049   CO2 24 11/18/2023 1049   GLUCOSE 212 (H) 11/18/2023 1049   BUN 11 11/18/2023 1049   CREATININE 0.71 11/18/2023 1049   CALCIUM 9.3 11/18/2023 1049   PROT 6.4 (L) 11/15/2023 1609   ALBUMIN 3.6 11/15/2023 1609   AST 29 11/15/2023 1609   ALT 17 11/15/2023 1609   ALKPHOS 89 11/15/2023 1609   BILITOT 0.6 11/15/2023 1609   GFRNONAA >60 11/15/2023 1609   GFRAA >60 11/17/2018 0758      Latest Ref Rng & Units 11/18/2023   10:49 AM 11/15/2023    4:09 PM 11/11/2023    6:52 PM  BMP  Glucose 65 - 99 mg/dL 161  096  045   BUN 7 - 25 mg/dL 11  11  15    Creatinine 0.50 - 0.99 mg/dL 4.09  8.11  9.14   BUN/Creat Ratio 6 - 22 (calc) SEE NOTE:     Sodium 135 - 146 mmol/L 140  134  138   Potassium 3.5 - 5.3 mmol/L 4.4  3.9  4.0   Chloride 98 - 110 mmol/L 103  103  102  CO2 20 - 32 mmol/L 24  22  28    Calcium 8.6 - 10.2 mg/dL 9.3  8.8  9.5        Component Value Date/Time   WBC 7.0 11/15/2023 1609   RBC 4.48 11/15/2023 1609   HGB 11.8 (L) 11/15/2023 1609   HCT 37.5 11/15/2023 1609   PLT 259 11/15/2023 1609   MCV 83.7 11/15/2023 1609   MCV 78.0 (A) 04/30/2012 1351   MCH 26.3 11/15/2023 1609   MCHC 31.5 11/15/2023 1609   RDW 13.5 11/15/2023 1609   LYMPHSABS 1.1 11/15/2023 1609   MONOABS 0.6 11/15/2023 1609   EOSABS 0.0 11/15/2023 1609   BASOSABS 0.0 11/15/2023 1609     Parts of this note may have been dictated using voice recognition software. There may be variances in spelling and vocabulary which are unintentional. Not all errors are proofread. Please notify the Thereasa Parkin if any discrepancies are noted or if the meaning of any statement is not clear.

## 2024-01-28 ENCOUNTER — Ambulatory Visit: Payer: Self-pay | Admitting: Professional Counselor

## 2024-01-28 ENCOUNTER — Encounter: Payer: Self-pay | Admitting: Professional Counselor

## 2024-01-28 DIAGNOSIS — F411 Generalized anxiety disorder: Secondary | ICD-10-CM | POA: Diagnosis not present

## 2024-01-28 DIAGNOSIS — F330 Major depressive disorder, recurrent, mild: Secondary | ICD-10-CM

## 2024-01-28 NOTE — Progress Notes (Signed)
      [facility] Counselor/Therapist Progress Note  [name], MRN: [id number],    Date: 01/28/2024  Time Spent: 9:11 AM to 10:08 AM  Treatment Type: Individual Therapy  Reported Symptoms: Grief/loss, stress, health concerns, worries, parenting concerns, intermittent low mood, restlessness  Mental Status Exam:  Appearance:   Casual     Behavior:  Appropriate, Sharing, and Motivated  Motor:  Normal  Speech/Language:   Clear and Coherent and Normal Rate  Affect:  Appropriate and Congruent  Mood:  normal  Thought process:  normal  Thought content:    WNL  Sensory/Perceptual disturbances:    WNL  Orientation:  oriented to person, place, time/date, and situation  Attention:  Good  Concentration:  Good  Memory:  WNL  Fund of knowledge:   Good  Insight:    Good  Judgment:   Good  Impulse Control:  Good   Risk Assessment: Danger to Self:  No Self-injurious Behavior: No Danger to Others: No Duty to Warn:no Physical Aggression / Violence:No  Access to Firearms a concern: No  Gang Involvement:No   Subjective: Patient presented to session to address concerns of anxiety and depression.  She reported mixed progress.  She reported her mother-in-law having passed away and processed her experience of loss and of the relationship over time.  She also identified another family loss of a pregnancy and the resonance with her own grief and loss for similar circumstance.  She processed how her experience of a miscarriage led to the trajectory of adoption and how grateful she is for the gifts of her sons and being a parent.  Patient processed parenting concerns at this time.  Counselor assisted in developing strategies to reduce parenting stress including continued prioritizing of marital relationship and related self-care.  Patient reported her diabetes diagnosis to be confirmed as type I after recent exacerbated health challenges, and she voiced being able to do better at  managing it including diet and to have an educator appointment pending.  Counselor reinforced patient proactive steps in managing her health and helped to instill hope and encouraged resiliency around her health journey.  Interventions: Solution-Oriented/Positive Psychology, Humanistic/Existential, and Insight-Oriented, Grief Therapy  Diagnosis:   ICD-10-CM   1. Generalized anxiety disorder  F41.1     2. Major depressive disorder, recurrent episode, mild (HCC)  F33.0       Plan: Patient is scheduled for follow-up; continue process work and developing coping skills.  Patient short-term goal between sessions to continue to prioritize dates with husband, managing her diabetes well, and continuing to implement boundaries and loving firmness with children.  Progress note was dictated with Dragon and reviewed for accuracy.  Gaspar Bidding, [facility]

## 2024-01-31 ENCOUNTER — Other Ambulatory Visit: Payer: Self-pay | Admitting: Behavioral Health

## 2024-01-31 DIAGNOSIS — F902 Attention-deficit hyperactivity disorder, combined type: Secondary | ICD-10-CM

## 2024-02-01 ENCOUNTER — Encounter: Payer: Self-pay | Admitting: "Endocrinology

## 2024-02-02 ENCOUNTER — Other Ambulatory Visit: Payer: Self-pay | Admitting: "Endocrinology

## 2024-02-02 MED ORDER — TIRZEPATIDE 2.5 MG/0.5ML ~~LOC~~ SOAJ: 2.5000 mg | SUBCUTANEOUS | 0 refills | Status: DC

## 2024-02-08 ENCOUNTER — Encounter (HOSPITAL_BASED_OUTPATIENT_CLINIC_OR_DEPARTMENT_OTHER): Payer: Self-pay | Admitting: Family Medicine

## 2024-02-08 ENCOUNTER — Ambulatory Visit (INDEPENDENT_AMBULATORY_CARE_PROVIDER_SITE_OTHER): Payer: BC Managed Care – PPO | Admitting: Family Medicine

## 2024-02-08 VITALS — BP 122/76 | HR 72 | Ht 65.0 in | Wt 167.7 lb

## 2024-02-08 DIAGNOSIS — N958 Other specified menopausal and perimenopausal disorders: Secondary | ICD-10-CM | POA: Insufficient documentation

## 2024-02-08 DIAGNOSIS — N951 Menopausal and female climacteric states: Secondary | ICD-10-CM | POA: Insufficient documentation

## 2024-02-08 DIAGNOSIS — Z0000 Encounter for general adult medical examination without abnormal findings: Secondary | ICD-10-CM

## 2024-02-08 DIAGNOSIS — E1065 Type 1 diabetes mellitus with hyperglycemia: Secondary | ICD-10-CM | POA: Diagnosis not present

## 2024-02-08 DIAGNOSIS — E7800 Pure hypercholesterolemia, unspecified: Secondary | ICD-10-CM

## 2024-02-08 NOTE — Progress Notes (Signed)
 New Patient Office Visit  Subjective   [name], female    DOB: Sep 17, 1976  Age: 48 y.o. MRN: [id number]  CC:  Chief Complaint  Patient presents with   New Patient (Initial Visit)    New patient is type 1 diabetic started mounjaro on 2/15 is not able to eat anything has really bad cramps this was prescribed by [facility]     HPI Mia Wilson presents to establish care Last PCP - Deboraha Sprang, stopped taking her insurance  T1DM: does follow with endocrinology, reports good control lately. Was started on Mounjaro, but has been having some GI side effects with this. Is currently on lowest dose. Does generally follow vegan diet loosely  HLD: takes atorvastatin inconsistently. Occasionally has some symptoms with medication.  Anxiety, Depression, Anxiety: managed by [facility].  Current medications include atomoxetine, duloxetine, dextromethorphan-bupropion.  She follows with both psychiatry and counselor at [facility].  No current concerns at this time.  Patient is originally from [place]. 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 She enjoys spending time with family. Has 4 sons, 2 adults and 2 teenagers, all are adopted. Has 2 dogs as well. Was caring for mother-in-law who passed recently.  Outpatient Encounter Medications as of 02/08/2024  Medication Sig   atomoxetine (STRATTERA) 60 MG capsule TAKE 1 CAPSULE BY MOUTH EVERY DAY   atorvastatin (LIPITOR) 10 MG tablet Take 10 mg by mouth daily.   Blood Glucose Monitoring Suppl (ONETOUCH VERIO FLEX SYSTEM) w/Device KIT CHECK BLOOD SUGAR BEFORE AND AFTER EACH MEAL   Continuous Glucose Sensor (DEXCOM G6 SENSOR) MISC USE AS INSTRUCTED CHANGE EVERY 10 DAYS   Continuous Glucose Transmitter (DEXCOM G6 TRANSMITTER) MISC AS DIRECTED CHANGE EVERY 90 DAYS   Dextromethorphan-buPROPion ER (AUVELITY) 45-105 MG TBCR Take one tablet by mouth two times daily at least 8 hours between doses    DULoxetine (CYMBALTA) 60 MG capsule Take 1 capsule (60 mg total) by mouth daily.   estradiol (CLIMARA - DOSED IN MG/24 HR) 0.0375 mg/24hr patch Place 0.0375 mg onto the skin once a week.   Glucagon (GVOKE HYPOPEN 1-PACK) 1 MG/0.2ML SOAJ Inject 1 mg into the skin as needed (low blood sugar with impaired consciousness).   Insulin Disposable Pump (OMNIPOD 5 G7 PODS, GEN 5,) MISC Inject 1 Device into the skin every 3 (three) days. 1 DEVICE BY DOES NOT APPLY ROUTE EVERY 3 (THREE) DAYS.   insulin lispro (HUMALOG) 100 UNIT/ML injection INJECT UPTO 50 UNITS INTO THE SKIN DAILY. USE MAX OF 50 UNITS DAILY VIA INSULIN PUMP   Lancets (ONETOUCH DELICA PLUS LANCET30G) MISC 1 each by Does not apply route See admin instructions. Use Onetouch Delica Plus lancets to check blood sugar before and after each meal.   Multiple Vitamin (MULTIVITAMIN WITH MINERALS) TABS Take 1 tablet by mouth daily.   ONETOUCH VERIO test strip USE ONETOUCH VERIO FLEX AS INSTRUCTED TO CHECK BLOOD SUGAR BEFORE AND AFTER EACH MEAL.   pantoprazole (PROTONIX) 40 MG tablet Take 1 tablet (40 mg total) by mouth daily.   progesterone (PROMETRIUM) 100 MG capsule    tirzepatide (MOUNJARO) 2.5 MG/0.5ML Pen Inject 2.5 mg into the skin once a week.   vitamin B-12 (CYANOCOBALAMIN) 1000 MCG tablet Take 1,000 mcg by mouth daily.   insulin glargine (LANTUS SOLOSTAR) 100 UNIT/ML Solostar Pen Inject 14 Units into the skin daily. In case of pump failure   [DISCONTINUED] polyethylene glycol powder (GLYCOLAX/MIRALAX) 17 GM/SCOOP powder Take 17 g by mouth 2 (  two) times daily. Until daily soft stools OTC (Patient not taking: Reported on 02/08/2024)   No facility-administered encounter medications on file as of 02/08/2024.    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)    Fibroids    PCOS (polycystic ovarian syndrome)     Past Surgical History:  Procedure Laterality Date   BREAST EXCISIONAL BIOPSY Right    BREAST  SURGERY     benign   IR ANGIOGRAM PELVIS SELECTIVE OR SUPRASELECTIVE  11/17/2018   IR ANGIOGRAM PELVIS SELECTIVE OR SUPRASELECTIVE  11/17/2018   IR ANGIOGRAM SELECTIVE EACH ADDITIONAL VESSEL  11/17/2018   IR ANGIOGRAM SELECTIVE EACH ADDITIONAL VESSEL  11/17/2018   IR EMBO TUMOR ORGAN ISCHEMIA INFARCT INC GUIDE ROADMAPPING  11/17/2018   IR RADIOLOGIST EVAL & MGMT  09/14/2018   IR RADIOLOGIST EVAL & MGMT  12/13/2018   IR RADIOLOGIST EVAL & MGMT  06/28/2019   IR US GUIDE VASC ACCESS RIGHT  11/17/2018   UTERINE FIBROID SURGERY      Family History  Problem Relation Age of Onset   Hyperlipidemia Other    Hypertension Other    Stroke Other    Cancer Other    Diabetes Maternal Uncle    Heart disease Paternal Uncle    Diabetes Maternal Grandmother    Heart disease Maternal Grandmother    Breast cancer Maternal Grandmother        diagnosed in her 31's   Diabetes Paternal Grandmother     Social History   Socioeconomic History   Marital status: Married    Spouse name: Not on file   Number of children: 4   Years of education: Not on file   Highest education level: Not on file  Occupational History   Occupation: research admn.  Tobacco Use   Smoking status: Never    Passive exposure: Never   Smokeless tobacco: Never  Vaping Use   Vaping status: Never Used  Substance and Sexual Activity   Alcohol use: No   Drug use: No   Sexual activity: Yes  Other Topics Concern   Not on file  Social History Narrative   Living in [place] with husband and two adopted children 15 and 14. Currently on FMLA.  She is not employed at this time. She has been her 2 youngest adopted children at home. Not involved a lot else here at this time. She has 2 other children also. She walks a little bit but does not do a lot since she has left her with fatigue.   Social Drivers of Corporate investment banker Strain: Not on file  Food Insecurity: Not on file  Transportation Needs: Not on file  Physical  Activity: Not on file  Stress: Stress Concern Present (05/19/2023)   [employer] of Occupational Health - Occupational Stress Questionnaire    Feeling of Stress : Rather much  Social Connections: Unknown (05/19/2023)   Social Connection and Isolation Panel [NHANES]    Frequency of Communication with Friends and Family: More than three times a week    Frequency of Social Gatherings with Friends and Family: More than three times a week    Attends Religious Services: More than 4 times per year    Active Member of [employer] or Organizations: Not on file    Attends Banker Meetings: Not on file    Marital Status: Married  Intimate Partner Violence: Unknown (02/26/2022)   [facility] from [facility]   HITS    Physically Hurt: Not on file    Insult or Talk Down To: Not on file    Threaten Physical Harm: Not on file    Scream or Curse: Not on file    Objective   BP 122/76 (BP Location: Left Arm, Patient Position: Sitting, Cuff Size: Normal)   Pulse 72   Ht 5\' 5"  (1.651 m)   Wt 167 lb 11.2 oz (76.1 kg)   LMP 11/10/2018 (Approximate)   SpO2 100%   BMI 27.91 kg/m   Physical Exam  48 year old female in no acute distress Cardiovascular exam with regular rate and rhythm Lungs clear to auscultation bilaterally  Assessment & Plan:   Pure hypercholesterolemia Assessment & Plan: Can continue with atorvastatin.  Does currently taking consistently.  Could consider alternative statin potentially.  We will plan to monitor lipid panel with labs before next appointment   Wellness examination -     CBC with Differential/Platelet; Future -     Comprehensive metabolic panel; Future -     Hemoglobin A1c; Future -     Lipid panel; Future -     TSH Rfx on Abnormal to Free T4; Future  Type 1 diabetes mellitus with hyperglycemia (HCC) Assessment & Plan: Most recent A1c above goal at 7.6%.  Recommend continued close follow-up with endocrinology.  We did discuss  considerations related to [facility].  She is currently on lowest dose and is having some GI side effects.  We discussed measures including smaller, more frequent meals as well as avoiding certain fatty/greasy foods which can also exacerbate potential side effects.  Ultimately, recommend discussing further with endocrinologist to discuss continuation of medication or consideration of alternative such as Ozempic or Trulicity if endocrinologist feels that these medications would be appropriate for her.   Return in about 7 months (around 09/09/2024) for CPE with fasting labs 1 week prior.    ___________________________________________ Jarielys Girardot de Peru, MD, ABFM, CAQSM Primary Care and Sports Medicine [facility]

## 2024-02-08 NOTE — Patient Instructions (Signed)
  Medication Instructions:  Your physician recommends that you continue on your current medications as directed. Please refer to the Current Medication list given to you today. --If you need a refill on any your medications before your next appointment, please call your pharmacy first. If no refills are authorized on file call the office.-- Lab Work: Your physician has recommended that you have lab work today: 1 week before next visit  If you have labs (blood work) drawn today and your tests are completely normal, you will receive your results via MyChart message OR a phone call from our staff.  Please ensure you check your voicemail in the event that you authorized detailed messages to be left on a delegated number. If you have any lab test that is abnormal or we need to change your treatment, we will call you to review the results.   Follow-Up: Your next appointment:   Your physician recommends that you schedule a follow-up appointment in: 7 month physical  with Dr. de Peru  You will receive a text message or e-mail with a link to a survey about your care and experience with [place] today! We would greatly appreciate your feedback!   Thanks for letting us be apart of your health journey!!  Primary Care and Sports Medicine   Dr. Ceasar Mons Peru   We encourage you to activate your patient portal called "MyChart".  Sign up information is provided on this After Visit Summary.  MyChart is used to connect with patients for Virtual Visits (Telemedicine).  Patients are able to view lab/test results, encounter notes, upcoming appointments, etc.  Non-urgent messages can be sent to your provider as well. To learn more about what you can do with MyChart, please visit --  [email or web address].

## 2024-02-08 NOTE — Assessment & Plan Note (Signed)
 Can continue with atorvastatin.  Does currently taking consistently.  Could consider alternative statin potentially.  We will plan to monitor lipid panel with labs before next appointment

## 2024-02-08 NOTE — Assessment & Plan Note (Signed)
 Most recent A1c above goal at 7.6%.  Recommend continued close follow-up with endocrinology.  We did discuss considerations related to [facility].  She is currently on lowest dose and is having some GI side effects.  We discussed measures including smaller, more frequent meals as well as avoiding certain fatty/greasy foods which can also exacerbate potential side effects.  Ultimately, recommend discussing further with endocrinologist to discuss continuation of medication or consideration of alternative such as Ozempic or Trulicity if endocrinologist feels that these medications would be appropriate for her.

## 2024-02-10 ENCOUNTER — Ambulatory Visit: Payer: Self-pay

## 2024-02-10 ENCOUNTER — Encounter: Payer: Self-pay | Admitting: Emergency Medicine

## 2024-02-10 ENCOUNTER — Ambulatory Visit
Admission: EM | Admit: 2024-02-10 | Discharge: 2024-02-10 | Disposition: A | Discharge status: Home / Self Care | Attending: Family Medicine | Admitting: Family Medicine

## 2024-02-10 DIAGNOSIS — T50905A Adverse effect of unspecified drugs, medicaments and biological substances, initial encounter: Secondary | ICD-10-CM

## 2024-02-10 DIAGNOSIS — E860 Dehydration: Secondary | ICD-10-CM

## 2024-02-10 MED ORDER — ONDANSETRON HCL 8 MG PO TABS: 8.0000 mg | ORAL_TABLET | Freq: Three times a day (TID) | ORAL | 0 refills | Status: DC | PRN

## 2024-02-10 MED ORDER — SODIUM CHLORIDE 0.9 % IV BOLUS
1000.0000 mL | Freq: Once | INTRAVENOUS | Status: AC
  Administered 2024-02-10: 1000 mL via INTRAVENOUS

## 2024-02-10 MED ORDER — ONDANSETRON 8 MG PO TBDP
8.0000 mg | ORAL_TABLET | Freq: Once | ORAL | Status: AC
  Administered 2024-02-10: 8 mg via ORAL

## 2024-02-10 NOTE — ED Notes (Signed)
Iv continues to infuse

## 2024-02-10 NOTE — ED Notes (Signed)
Provider and RN notified.

## 2024-02-10 NOTE — Telephone Encounter (Signed)
 Copied from CRM [id number]. Topic: Clinical - Red Word Triage >> Feb 10, 2024  1:00 PM Fredrich Romans wrote: Red Word that prompted transfer to Nurse Triage: bright yellow diarrhea, really bad stomach cramping   Chief Complaint: Diarrhea Symptoms: Nausea, Diarrhea,  Frequency: Acute Pertinent Negatives: Patient denies fever Disposition: [] ED /[x] Urgent Care (no appt availability in office) / [] Appointment(In office/virtual)/ []  [name]/ [] Home Care/ [] Refused Recommended Disposition /[] [place] [facility]/ []  Follow-up with PCP Additional Notes: TS is being triaged for diarrhea, nausea, and abdominal pain. The patient has been experiencing these symptoms for the past couple of days so far.  History of DM I and taking Mounjaro. Since taking Mounjaro the patient reports nausea, diarrhea, and abdominal cramping. Based on presentation of symptoms, duration, severity, and lack of in office availability, recommended the patient seek the nearest Urgent Care per protocol.   Reason for Disposition  [1] MODERATE diarrhea (e.g., 4-6 times / day more than normal) AND [2] present > 48 hours (2 days)  Answer Assessment - Initial Assessment Questions 1. DIARRHEA SEVERITY: "How bad is the diarrhea?" "How many more stools have you had in the past 24 hours than normal?"    - NO DIARRHEA (SCALE 0)   - MILD (SCALE 1-3): Few loose or mushy BMs; increase of 1-3 stools over normal daily number of stools; mild increase in ostomy output.   -  MODERATE (SCALE 4-7): Increase of 4-6 stools daily over normal; moderate increase in ostomy output.   -  SEVERE (SCALE 8-10; OR "WORST POSSIBLE"): Increase of 7 or more stools daily over normal; moderate increase in ostomy output; incontinence.     6  2. ONSET: "When did the diarrhea begin?"      Saturday  3. BM CONSISTENCY: "How loose or watery is the diarrhea?"      Both, more watery  4. VOMITING: "Are you also vomiting?" If Yes, ask: "How many times in the  past 24 hours?"      No  5. ABDOMEN PAIN: "Are you having any abdomen pain?" If Yes, ask: "What does it feel like?" (e.g., crampy, dull, intermittent, constant)      Abdominal Pain, Cramping, Constant  6. ABDOMEN PAIN SEVERITY: If present, ask: "How bad is the pain?"  (e.g., Scale 1-10; mild, moderate, or severe)   - MILD (1-3): doesn't interfere with normal activities, abdomen soft and not tender to touch    - MODERATE (4-7): interferes with normal activities or awakens from sleep, abdomen tender to touch    - SEVERE (8-10): excruciating pain, doubled over, unable to do any normal activities       7  7. ORAL INTAKE: If vomiting, "Have you been able to drink liquids?" "How much liquids have you had in the past 24 hours?"     No  8. HYDRATION: "Any signs of dehydration?" (e.g., dry mouth [not just dry lips], too weak to stand, dizziness, new weight loss) "When did you last urinate?"     Weakness, Dry Mouth, Dry Lips  9. EXPOSURE: "Have you traveled to a foreign country recently?" "Have you been exposed to anyone with diarrhea?" "Could you have eaten any food that was spoiled?"     No  10. ANTIBIOTIC USE: "Are you taking antibiotics now or have you taken antibiotics in the past 2 months?"       No  11. OTHER SYMPTOMS: "Do you have any other symptoms?" (e.g., fever, blood in stool)  Weakness, Metallic Taste  12. PREGNANCY: "Is there any chance you are pregnant?" "When was your last menstrual period?"       No, Not currently  Protocols used: [facility]

## 2024-02-10 NOTE — ED Notes (Signed)
 Iv attempt x 2. 1st with 20g 2nd with 22g

## 2024-02-10 NOTE — Discharge Instructions (Signed)
 Continue the ondansetron (Zofran) 8 mg 2-3 times a day until nausea has resolved Increase fluids.  Electrolyte drinks with no sugar are a good choice To eat and bland diet as soon as you are able Call your doctor tomorrow to advise them of your [facility] reaction Your test results available on MyChart

## 2024-02-10 NOTE — ED Triage Notes (Signed)
 Patient states that she started on Monjuaro 0.25mg  injection on Saturday for Type I Diabetes.  Since doing the injection, patient has been very nauseous.  Today patient is having abdominal pain and having extreme diarrhea.  Patient has lost 15lbs in one week.  Patient is unable to eat.

## 2024-02-10 NOTE — ED Provider Notes (Signed)
 [facility]    CSN: [id number] Arrival date & time: 02/10/24  1512      History   Chief Complaint Chief Complaint  Patient presents with   Nausea    HPI Mia Wilson is a 48 y.o. female.   Patient believes she is having reaction to medication.  She started her first injection for [facility] on Saturday of this week.  (5 days ago).  She is a type I diabetic with poor control and it is being started to help reduce her A1c.  She states that ever since that time she has had nausea.  She states she cannot eat.  She has developed diarrhea.  In the last several days she thinks she has lost 15 pounds.  She is feeling very weak.  She stands up she feels lightheaded.  She is brought in by her husband for evaluation.  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   Uterine fibroid 11/17/2018   Fibroids 11/17/2018   Pure hypercholesterolemia 04/02/2015   Diabetes (HCC) 11/20/2013   Anemia 11/14/2013   Perimenopausal menorrhagia 11/14/2013   Type II diabetes mellitus, uncontrolled 06/20/2013   PCOS (polycystic ovarian syndrome)     Past Surgical History:  Procedure Laterality Date   BREAST EXCISIONAL BIOPSY Right    BREAST SURGERY     benign   IR ANGIOGRAM PELVIS SELECTIVE OR SUPRASELECTIVE  11/17/2018   IR ANGIOGRAM PELVIS SELECTIVE OR SUPRASELECTIVE  11/17/2018   IR ANGIOGRAM SELECTIVE EACH ADDITIONAL VESSEL  11/17/2018   IR ANGIOGRAM SELECTIVE EACH ADDITIONAL VESSEL  11/17/2018   IR EMBO TUMOR ORGAN ISCHEMIA INFARCT INC GUIDE ROADMAPPING  11/17/2018   IR RADIOLOGIST EVAL & MGMT  09/14/2018    IR RADIOLOGIST EVAL & MGMT  12/13/2018   IR RADIOLOGIST EVAL & MGMT  06/28/2019   IR US GUIDE VASC ACCESS RIGHT  11/17/2018   UTERINE FIBROID SURGERY      OB History   No obstetric history on file.      Home Medications    Prior to Admission medications   Medication Sig Start Date End Date Taking? Authorizing Provider  atomoxetine (STRATTERA) 60 MG capsule TAKE 1 CAPSULE BY MOUTH EVERY DAY 02/01/24  Yes White, Brian A, NP  atorvastatin (LIPITOR) 10 MG tablet Take 10 mg by mouth daily.   Yes [provider]  Blood Glucose Monitoring Suppl (ONETOUCH VERIO FLEX SYSTEM) w/Device KIT CHECK BLOOD SUGAR BEFORE AND AFTER [facility] MEAL 01/23/21  Yes Reather Littler, MD  Continuous Glucose Sensor (DEXCOM G6 SENSOR) MISC USE AS INSTRUCTED CHANGE EVERY 10 DAYS 11/08/23  Yes Motwani, Komal, MD  Continuous Glucose Transmitter (DEXCOM G6 TRANSMITTER) MISC AS DIRECTED CHANGE EVERY 90 DAYS 11/08/23  Yes Motwani, Carin Hock, MD  Dextromethorphan-buPROPion ER (AUVELITY) 45-105 MG TBCR Take one tablet by mouth two times daily at least 8 hours between doses 01/03/24  Yes White, Brian A, NP  DULoxetine (CYMBALTA) 60 MG capsule Take 1 capsule (60 mg total) by mouth daily. 01/03/24  Yes White, Watt Climes, NP  estradiol (CLIMARA - DOSED IN MG/24 HR)  0.0375 mg/24hr patch Place 0.0375 mg onto the skin once a week.   Yes [provider]  Glucagon (GVOKE HYPOPEN 1-PACK) 1 MG/0.2ML SOAJ Inject 1 mg into the skin as needed (low blood sugar with impaired consciousness). 10/20/23  Yes Motwani, Komal, MD  Insulin Disposable Pump (OMNIPOD 5 G7 PODS, GEN 5,) MISC Inject 1 Device into the skin every 3 (three) days. 1 DEVICE BY DOES NOT APPLY ROUTE EVERY 3 (THREE) DAYS. 11/11/23  Yes Motwani, Komal, MD  insulin lispro (HUMALOG) 100 UNIT/ML injection INJECT UPTO 50 UNITS INTO THE SKIN DAILY. USE MAX OF 50 UNITS DAILY VIA INSULIN PUMP 12/10/23  Yes Motwani, Carin Hock, MD  Lancets (ONETOUCH DELICA PLUS LANCET30G) MISC 1 each by Does not  apply route See admin instructions. Use Onetouch Delica Plus lancets to check blood sugar before and after each meal. 01/23/21  Yes Reather Littler, MD  Multiple Vitamin (MULTIVITAMIN WITH MINERALS) TABS Take 1 tablet by mouth daily.   Yes [provider]  ondansetron (ZOFRAN) 8 MG tablet Take 1 tablet (8 mg total) by mouth every 8 (eight) hours as needed for nausea or vomiting. 02/10/24  Yes Eustace Moore, MD  [facility] VERIO test strip USE ONETOUCH VERIO FLEX AS INSTRUCTED TO CHECK BLOOD SUGAR BEFORE AND AFTER EACH MEAL. 01/23/21  Yes Reather Littler, MD  pantoprazole (PROTONIX) 40 MG tablet Take 1 tablet (40 mg total) by mouth daily. 01/17/24  Yes McMichael, Bayley M, PA-C  progesterone (PROMETRIUM) 100 MG capsule  03/16/23  Yes [provider]  tirzepatide Greggory Keen) 2.5 MG/0.5ML Pen Inject 2.5 mg into the skin once a week. 02/02/24  Yes Motwani, Komal, MD  vitamin B-12 (CYANOCOBALAMIN) 1000 MCG tablet Take 1,000 mcg by mouth daily.   Yes [provider]  insulin glargine (LANTUS SOLOSTAR) 100 UNIT/ML Solostar Pen Inject 14 Units into the skin daily. In case of pump failure 11/18/23 12/18/23  Altamese Townville, MD    Family History Family History  Problem Relation Age of Onset   Healthy Mother    Healthy Father    Diabetes Maternal Grandmother    Heart disease Maternal Grandmother    Breast cancer Maternal Grandmother        diagnosed in her 62's   Diabetes Paternal Grandmother    Diabetes Maternal Uncle    Heart disease Paternal Uncle    Hyperlipidemia Other    Hypertension Other    Stroke Other    Cancer Other     Social History Social History   Tobacco Use   Smoking status: Never    Passive exposure: Never   Smokeless tobacco: Never  Vaping Use   Vaping status: Never Used  Substance Use Topics   Alcohol use: No   Drug use: No     Allergies   Mounjaro [tirzepatide], Other, Bupropion, Strawberry extract, and Tomato   Review of Systems Review of  Systems  See HPI Physical Exam Triage Vital Signs ED Triage Vitals  Encounter Vitals Group     BP 02/10/24 1529 (!) 92/58     Systolic BP Percentile --      Diastolic BP Percentile --      Pulse Rate 02/10/24 1529 (!) 107     Resp 02/10/24 1529 18     Temp 02/10/24 1529 98.2 F (36.8 C)     Temp Source 02/10/24 1529 Oral     SpO2 02/10/24 1529 98 %     Weight 02/10/24 1531 162 lb (73.5 kg)  Height 02/10/24 1531 5\' 5"  (1.651 m)     Head Circumference --      Peak Flow --      Pain Score 02/10/24 1530 6     Pain Loc --      Pain Education --      Exclude from Growth Chart --    No data found.  Updated Vital Signs BP 107/73   Pulse 89   Temp 98.2 F (36.8 C) (Oral)   Resp 18   Ht 5\' 5"  (1.651 m)   Wt 73.5 kg   LMP 11/10/2018 (Approximate)   SpO2 98%   BMI 26.96 kg/m       Physical Exam Constitutional:      General: She is not in acute distress.    Appearance: She is well-developed. She is ill-appearing.  HENT:     Head: Normocephalic and atraumatic.     Mouth/Throat:     Mouth: Mucous membranes are dry.  Eyes:     Conjunctiva/sclera: Conjunctivae normal.     Pupils: Pupils are equal, round, and reactive to light.  Cardiovascular:     Rate and Rhythm: Normal rate.  Pulmonary:     Effort: Pulmonary effort is normal. No respiratory distress.  Abdominal:     General: Abdomen is flat. There is no distension.     Palpations: Abdomen is soft.     Tenderness: There is no abdominal tenderness.  Musculoskeletal:        General: Normal range of motion.     Cervical back: Normal range of motion.  Skin:    General: Skin is warm and dry.  Neurological:     Mental Status: She is alert.      UC Treatments / Results  Labs (all labs ordered are listed, but only abnormal results are displayed) Labs Reviewed  COMPREHENSIVE METABOLIC PANEL  LIPASE    EKG   Radiology No results found.  Procedures Procedures (including critical care time)  Medications  Ordered in UC Medications  sodium chloride 0.9 % bolus 1,000 mL (1,000 mLs Intravenous New Bag/Given 02/10/24 1602)  ondansetron (ZOFRAN-ODT) disintegrating tablet 8 mg (8 mg Oral Given 02/10/24 1546)    Initial Impression / Assessment and Plan / UC Course  I have reviewed the triage vital signs and the nursing notes.  Pertinent labs & imaging results that were available during my care of the patient were reviewed by me and considered in my medical decision making (see chart for details).     Patient is reevaluated after liter of IV fluids.  She is alert and smiling.  She feels much better.  Abdominal pain has improved dramatically.  Nausea is absent.  She feels she can take oral fluids. Final Clinical Impressions(s) / UC Diagnoses   Final diagnoses:  Dehydration  Medication adverse effect, initial encounter     Discharge Instructions      Continue the ondansetron (Zofran) 8 mg 2-3 times a day until nausea has resolved Increase fluids.  Electrolyte drinks with no sugar are a good choice To eat and bland diet as soon as you are able Call your doctor tomorrow to advise them of your [facility] reaction Your test results available on MyChart     ED Prescriptions     Medication Sig Dispense Auth. Provider   ondansetron (ZOFRAN) 8 MG tablet Take 1 tablet (8 mg total) by mouth every 8 (eight) hours as needed for nausea or vomiting. 20 tablet Eustace Moore, MD  PDMP not reviewed this encounter.   Eustace Moore, MD 02/10/24 [phone]

## 2024-02-10 NOTE — ED Notes (Signed)
 Iv site unremarkable, ivf infusing

## 2024-02-11 ENCOUNTER — Ambulatory Visit: Payer: Self-pay | Admitting: Professional Counselor

## 2024-02-12 ENCOUNTER — Telehealth: Payer: Self-pay

## 2024-02-12 NOTE — Telephone Encounter (Signed)
 Pt called to check on bloodwork, still pending. Advised to check back Monday. Call if still no results then. Pt verbalized understanding.

## 2024-02-15 ENCOUNTER — Telehealth: Payer: Self-pay | Admitting: Emergency Medicine

## 2024-02-15 LAB — SPECIMEN STATUS

## 2024-02-15 LAB — COMPREHENSIVE METABOLIC PANEL
ALT: 22 IU/L (ref 0–32)
AST: 34 IU/L (ref 0–40)
Albumin: 5 g/dL — ABNORMAL HIGH (ref 3.9–4.9)
Alkaline Phosphatase: 143 IU/L — ABNORMAL HIGH (ref 44–121)
BUN/Creatinine Ratio: 16 (ref 9–23)
BUN: 18 mg/dL (ref 6–24)
Bilirubin Total: 1.6 mg/dL — ABNORMAL HIGH (ref 0.0–1.2)
Calcium: 10.1 mg/dL (ref 8.7–10.2)
Chloride: 102 mmol/L (ref 96–106)
Creatinine, Ser: 1.12 mg/dL — ABNORMAL HIGH (ref 0.57–1.00)
Globulin, Total: 3.3 g/dL (ref 1.5–4.5)
Glucose: 162 mg/dL — ABNORMAL HIGH (ref 70–99)
Potassium: 5.2 mmol/L (ref 3.5–5.2)
Sodium: 143 mmol/L (ref 134–144)
Total Protein: 8.3 g/dL (ref 6.0–8.5)
eGFR: 60 mL/min/{1.73_m2} (ref >59)

## 2024-02-15 LAB — LIPASE: Lipase: 13 U/L — ABNORMAL LOW (ref 14–72)

## 2024-02-15 NOTE — Telephone Encounter (Signed)
 Patient called regarding her lab results.  Advised per Dr Delton See, labs were normal, no sign of pancreatis. Patient to follow up with PCP as needed.  Patient voices understanding.

## 2024-02-21 ENCOUNTER — Ambulatory Visit (HOSPITAL_BASED_OUTPATIENT_CLINIC_OR_DEPARTMENT_OTHER): Payer: BC Managed Care – PPO | Admitting: Family Medicine

## 2024-02-25 ENCOUNTER — Other Ambulatory Visit: Payer: Self-pay | Admitting: Behavioral Health

## 2024-02-25 ENCOUNTER — Ambulatory Visit (INDEPENDENT_AMBULATORY_CARE_PROVIDER_SITE_OTHER): Payer: BC Managed Care – PPO | Admitting: Professional Counselor

## 2024-02-25 ENCOUNTER — Encounter: Payer: Self-pay | Admitting: Professional Counselor

## 2024-02-25 DIAGNOSIS — F902 Attention-deficit hyperactivity disorder, combined type: Secondary | ICD-10-CM

## 2024-02-25 DIAGNOSIS — F411 Generalized anxiety disorder: Secondary | ICD-10-CM

## 2024-02-25 DIAGNOSIS — F330 Major depressive disorder, recurrent, mild: Secondary | ICD-10-CM

## 2024-02-25 NOTE — Progress Notes (Signed)
 8un       [facility] Counselor/Therapist Progress Note  [name], MRN: [id number],    Date: 02/25/2024  Time Spent: 9:10 AM to 10:13 AM  Treatment Type: Individual Therapy  Reported Symptoms: Sense of overwhelm, somatic/health concerns, grief/loss, self-care concerns, worries, parenting concerns  Mental Status Exam:  Appearance:   Casual     Behavior:  Appropriate, Sharing, and Motivated  Motor:  Normal  Speech/Language:   Clear and Coherent and Normal Rate  Affect:  Appropriate and Congruent  Mood:  normal  Thought process:  normal  Thought content:    WNL  Sensory/Perceptual disturbances:    WNL  Orientation:  oriented to person, place, time/date, and situation  Attention:  Good  Concentration:  Good  Memory:  WNL  Fund of knowledge:   Good  Insight:    Good  Judgment:   Good  Impulse Control:  Good   Risk Assessment: Danger to Self:  No Self-injurious Behavior: No Danger to Others: No Duty to Warn:no Physical Aggression / Violence:No  Access to Firearms a concern: No  Gang Involvement:No   Subjective: Patient presented to session to address concerns of anxiety and depression.  Patient reported mixed progress at this time.  She processed experience of health management concerns, diabetes in particular.  She reported challenges with comfort eating and cravings.  Counselor provided psychoeducation regarding post prandial blood flow as relates anxiety, and helped patient identify adaptive coping strategies.  Patient processed continued experience of grief and loss for her mother-in-law, and sadness regarding to extended family members cancer diagnoses.  She discussed parenting and marital concerns, and while voiced challenges, reported progress with prioritizing of date nights for she and her husband.  Counselor affirmed patient proactive efforts and reinforced her strengths including strong family ties and devotion, and resiliency.  Counselor and patient  revisited patient treatment plan for which patient had given verbal consent, and with technical glitches resolved, patient signed plan.  Counselor and patient discussed patient short-term personal goal between sessions which patient identified is working to decluttering her office and to continue to prioritize self-care including grooming, dressing, and the like.  Interventions: Solution-Oriented/Positive Psychology, Humanistic/Existential, and Insight-Oriented, Psychoeducation  Diagnosis:   ICD-10-CM   1. Generalized anxiety disorder  F41.1     2. Major depressive disorder, recurrent episode, mild (HCC)  F33.0       Plan: Patient is scheduled for follow-up; continue process work and developing coping skills.  Patient short-term personal goal between sessions to work to decluttering her office and to continue to prioritize self-care.  Progress note was dictated with Dragon and reviewed for accuracy.  Anthon Kins, [facility]

## 2024-02-28 ENCOUNTER — Encounter: Payer: Self-pay | Admitting: Behavioral Health

## 2024-02-28 ENCOUNTER — Ambulatory Visit (INDEPENDENT_AMBULATORY_CARE_PROVIDER_SITE_OTHER): Payer: BC Managed Care – PPO | Admitting: Behavioral Health

## 2024-02-28 VITALS — BP 120/78 | HR 80 | Wt 167.0 lb

## 2024-02-28 DIAGNOSIS — F902 Attention-deficit hyperactivity disorder, combined type: Secondary | ICD-10-CM

## 2024-02-28 DIAGNOSIS — F411 Generalized anxiety disorder: Secondary | ICD-10-CM

## 2024-02-28 DIAGNOSIS — F330 Major depressive disorder, recurrent, mild: Secondary | ICD-10-CM

## 2024-02-28 NOTE — Progress Notes (Signed)
 [facility] Med Check  [name],  MRN: [id number]  PCP: de [place], Raymond J, MD  Date of Evaluation: 02/28/2024 Time spent:0 minutes  Chief Complaint:  Chief Complaint   Depression; ADHD; Anxiety; Follow-up; Medication Refill; Patient Education; Medication Problem     HISTORY/CURRENT STATUS: HPI "Mia Wilson", 48 year old female presents to this office for follow up and medication management.  Collateral information should be considered reliable. She is more calm this visit and less talkative. Smiling and happy. Says she felt like she "slipped" a little bit with depression. Noticed that she wore same clothes for 4 days and was not getting out of the house. She mentioned to her therapist.  Still feels like her medication regimen is working well but understands she needs to get outside with natural sunlight, and start exercising more. Recently stopped RX of Monjouro. There are increased risk of depression when using this medication.  Requesting no changes this visit.  She reports her anxiety today 2/10, and depression at 2/10.  She denies history of mania, no psychosis, no auditory or visual hallucinations.  Denies SI or HI.   Past psychiatric medication trials: Zoloft Wellbutrin Lexapro Cymbalta Strattera Individual Medical History/ Review of Systems: Changes? :No   Allergies: Mounjaro [tirzepatide], Other, Bupropion, Strawberry extract, and Tomato  Current Medications:  Current Outpatient Medications:    atomoxetine (STRATTERA) 60 MG capsule, TAKE 1 CAPSULE BY MOUTH EVERY DAY, Disp: 30 capsule, Rfl: 0   atorvastatin (LIPITOR) 10 MG tablet, Take 10 mg by mouth daily., Disp: , Rfl:    Blood Glucose Monitoring Suppl (ONETOUCH VERIO FLEX SYSTEM) w/Device KIT, CHECK BLOOD SUGAR BEFORE AND AFTER EACH MEAL, Disp: 1 kit, Rfl: 0   Continuous Glucose Sensor (DEXCOM G6 SENSOR) MISC, USE AS INSTRUCTED CHANGE EVERY 10 DAYS, Disp: 9 each, Rfl: 3   Continuous Glucose  Transmitter (DEXCOM G6 TRANSMITTER) MISC, AS DIRECTED CHANGE EVERY 90 DAYS, Disp: 1 each, Rfl: 3   Dextromethorphan-buPROPion ER (AUVELITY) 45-105 MG TBCR, Take one tablet by mouth two times daily at least 8 hours between doses, Disp: 60 tablet, Rfl: 3   DULoxetine (CYMBALTA) 60 MG capsule, Take 1 capsule (60 mg total) by mouth daily., Disp: 90 capsule, Rfl: 1   estradiol (CLIMARA - DOSED IN MG/24 HR) 0.0375 mg/24hr patch, Place 0.0375 mg onto the skin once a week., Disp: , Rfl:    Glucagon (GVOKE HYPOPEN 1-PACK) 1 MG/0.2ML SOAJ, Inject 1 mg into the skin as needed (low blood sugar with impaired consciousness)., Disp: 0.4 mL, Rfl: 2   Insulin Disposable Pump (OMNIPOD 5 G7 PODS, GEN 5,) MISC, Inject 1 Device into the skin every 3 (three) days. 1 DEVICE BY DOES NOT APPLY ROUTE EVERY 3 (THREE) DAYS., Disp: 30 each, Rfl: 2   insulin glargine (LANTUS SOLOSTAR) 100 UNIT/ML Solostar Pen, Inject 14 Units into the skin daily. In case of pump failure, Disp: 6 mL, Rfl: 1   insulin lispro (HUMALOG) 100 UNIT/ML injection, INJECT UPTO 50 UNITS INTO THE SKIN DAILY. USE MAX OF 50 UNITS DAILY VIA INSULIN PUMP, Disp: 75 mL, Rfl: 3   Lancets (ONETOUCH DELICA PLUS LANCET30G) MISC, 1 each by Does not apply route See admin instructions. Use Onetouch Delica Plus lancets to check blood sugar before and after each meal., Disp: 200 each, Rfl: 3   Multiple Vitamin (MULTIVITAMIN WITH MINERALS) TABS, Take 1 tablet by mouth daily., Disp: , Rfl:    ondansetron (ZOFRAN) 8 MG tablet, Take 1 tablet (8 mg total) by mouth every 8 (  eight) hours as needed for nausea or vomiting., Disp: 20 tablet, Rfl: 0   ONETOUCH VERIO test strip, USE ONETOUCH VERIO FLEX AS INSTRUCTED TO CHECK BLOOD SUGAR BEFORE AND AFTER EACH MEAL., Disp: 200 strip, Rfl: 3   pantoprazole (PROTONIX) 40 MG tablet, Take 1 tablet (40 mg total) by mouth daily., Disp: 30 tablet, Rfl: 2   progesterone (PROMETRIUM) 100 MG capsule, , Disp: , Rfl:    tirzepatide (MOUNJARO) 2.5  MG/0.5ML Pen, Inject 2.5 mg into the skin once a week., Disp: 2 mL, Rfl: 0   vitamin B-12 (CYANOCOBALAMIN) 1000 MCG tablet, Take 1,000 mcg by mouth daily., Disp: , Rfl:  Medication Side Effects: none  Family Medical/ Social History: Changes? No  MENTAL HEALTH EXAM:  Blood pressure 120/78, pulse 80, weight 167 lb (75.8 kg), last menstrual period 11/10/2018.Body mass index is 27.79 kg/m.  General Appearance: Casual, Neat, and Well Groomed  Eye Contact:  NA  Speech:  Clear and Coherent  Volume:  Normal  Mood:  Negative  Affect:  Appropriate  Thought Process:  Coherent  Orientation:  Negative  Thought Content: Logical   Suicidal Thoughts:  No  Homicidal Thoughts:  No  Memory:  WNL  Judgement:  Good  Insight:  Good  Psychomotor Activity:  Normal  Concentration:  Concentration: Good  Recall:  Good  Fund of Knowledge: Good  Language: Good  Assets:  Desire for Improvement  ADL's:  Intact  Cognition: WNL  Prognosis:  Good    DIAGNOSES:    ICD-10-CM   1. Major depressive disorder, recurrent episode, mild (HCC)  F33.0     2. Generalized anxiety disorder  F41.1     3. Attention deficit hyperactivity disorder (ADHD), combined type  F90.2       Receiving Psychotherapy: No    RECOMMENDATIONS:   Greater than 50% of 30 min face to face time with patient was spent on counseling and coordination of care. Discussed her recent symptoms of mild depression. She has been very stable recently but stopped taking Monjouro. She experienced negative side efffects. Recent ER visit for dehydration. We discussed importance of getting natural sunlight at least 15 min per day. Increased exercise like taking walks in her neighborhood. She believes that being inside the house all the time as not helped.  Strattera continues to work well. She is completing her task effectively. We also discussed changes in social dynamics with her family. Her work life and performance has improved recently.  We agreed  today to: Continue  Auvelity 90-210 mg daily.  Will continue Cymbalta 90 mg daily Continue   taking Strattera 60  mg daily To follow-up in 8 weeks to reassess Will report worsening symptoms or side effects promptly Provided emergency contact information Discussed potential metabolic side effects associated with atypical antipsychotics, as well as potential risk for movement side effects. Advised pt to contact office if movement side effects occur.   Reviewed PDMP    Joan Flores, NP

## 2024-03-13 NOTE — Progress Notes (Deleted)
 Chief Complaint: Follow up Primary GI MD: Dr. Venice Gillis  HPI: 48 year old female history anxiety, depression, PCOS, diabetes, presents for follow-up  Last seen 01/17/2024.  See this note for details.  History of chronic constipation and lower abdominal cramping improved with MiraLAX .  History of epigastric discomfort, nausea, eructation and provided with pantoprazole  40 Mg once daily   Patient seen at urgent care 02/10/2024 for reaction to Mounjaro.  Started Mounjaro for her history of uncontrolled diabetes and since starting it she had nausea, diarrhea, and lost 15 pounds over the course of several days and was found to be dehydrated.  She was given Zofran  and 1000 mL saline with improvement  CMP revealed elevated alk phos of 143 and T. bili of 1.6.  AST/ALT normal.  Lipase normal.  Discussed the use of AI scribe software for clinical note transcription with the patient, who gave verbal consent to proceed.  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)    Fibroids    PCOS (polycystic ovarian syndrome)     Past Surgical History:  Procedure Laterality Date   BREAST EXCISIONAL BIOPSY Right    BREAST SURGERY     benign   IR ANGIOGRAM PELVIS SELECTIVE OR SUPRASELECTIVE  11/17/2018   IR ANGIOGRAM PELVIS SELECTIVE OR SUPRASELECTIVE  11/17/2018   IR ANGIOGRAM SELECTIVE EACH ADDITIONAL VESSEL  11/17/2018   IR ANGIOGRAM SELECTIVE EACH ADDITIONAL VESSEL  11/17/2018   IR EMBO TUMOR ORGAN ISCHEMIA INFARCT INC GUIDE ROADMAPPING  11/17/2018   IR RADIOLOGIST EVAL & MGMT  09/14/2018   IR RADIOLOGIST EVAL & MGMT  12/13/2018   IR RADIOLOGIST EVAL & MGMT  06/28/2019   IR US  GUIDE VASC  ACCESS RIGHT  11/17/2018   UTERINE FIBROID SURGERY      Current Outpatient Medications  Medication Sig Dispense Refill   atomoxetine  (STRATTERA ) 60 MG capsule TAKE 1 CAPSULE BY MOUTH EVERY DAY 90 capsule 0   atorvastatin (LIPITOR) 10 MG tablet Take 10 mg by mouth daily.     Blood Glucose Monitoring Suppl (ONETOUCH VERIO FLEX SYSTEM) w/Device KIT CHECK BLOOD SUGAR BEFORE AND AFTER EACH MEAL 1 kit 0   Continuous Glucose Sensor (DEXCOM G6 SENSOR) MISC USE AS INSTRUCTED CHANGE EVERY 10 DAYS 9 each 3   Continuous Glucose Transmitter (DEXCOM G6 TRANSMITTER) MISC AS DIRECTED CHANGE EVERY 90 DAYS 1 each 3   Dextromethorphan-buPROPion ER (AUVELITY ) 45-105 MG TBCR Take one tablet by mouth two times daily at least 8 hours between doses 60 tablet 3   DULoxetine  (CYMBALTA ) 60 MG capsule Take 1 capsule (60 mg total) by mouth daily. 90 capsule 1   estradiol (CLIMARA - DOSED IN MG/24 HR) 0.0375 mg/24hr patch Place 0.0375 mg onto the skin once a week.     Glucagon  (GVOKE HYPOPEN  1-PACK) 1 MG/0.2ML SOAJ Inject 1 mg into the skin as needed (low blood sugar with impaired consciousness). 0.4 mL 2   Insulin  Disposable Pump (OMNIPOD 5 G7 PODS, GEN 5,) MISC Inject 1 Device into the skin every 3 (three) days. 1 DEVICE BY DOES NOT APPLY ROUTE EVERY 3 (THREE) DAYS. 30 each 2   insulin  glargine (LANTUS  SOLOSTAR) 100 UNIT/ML Solostar Pen Inject  14 Units into the skin daily. In case of pump failure 6 mL 1   insulin  lispro (HUMALOG ) 100 UNIT/ML injection INJECT UPTO 50 UNITS INTO THE SKIN DAILY. USE MAX OF 50 UNITS DAILY VIA INSULIN  PUMP 75 mL 3   Lancets (ONETOUCH DELICA PLUS LANCET30G) MISC 1 each by Does not apply route See admin instructions. Use Onetouch Delica Plus lancets to check blood sugar before and after each meal. 200 each 3   Multiple Vitamin (MULTIVITAMIN WITH MINERALS) TABS Take 1 tablet by mouth daily.     ondansetron  (ZOFRAN ) 8 MG tablet Take 1 tablet (8 mg total) by mouth every 8 (eight) hours as needed for  nausea or vomiting. 20 tablet 0   ONETOUCH VERIO test strip USE ONETOUCH VERIO FLEX AS INSTRUCTED TO CHECK BLOOD SUGAR BEFORE AND AFTER EACH MEAL. 200 strip 3   pantoprazole  (PROTONIX ) 40 MG tablet Take 1 tablet (40 mg total) by mouth daily. 30 tablet 2   progesterone (PROMETRIUM) 100 MG capsule      tirzepatide (MOUNJARO) 2.5 MG/0.5ML Pen Inject 2.5 mg into the skin once a week. 2 mL 0   vitamin B-12 (CYANOCOBALAMIN ) 1000 MCG tablet Take 1,000 mcg by mouth daily.     No current facility-administered medications for this visit.    Allergies as of 03/14/2024 - Review Complete 02/28/2024  Allergen Reaction Noted   Mounjaro [tirzepatide] Diarrhea and Nausea And Vomiting 02/10/2024   Other Anaphylaxis and Hives 04/08/2016   Bupropion  02/08/2024   Strawberry extract Hives 11/08/2013   Tomato Hives 11/08/2013    Family History  Problem Relation Age of Onset   Healthy Mother    Healthy Father    Diabetes Maternal Grandmother    Heart disease Maternal Grandmother    Breast cancer Maternal Grandmother        diagnosed in her 49's   Diabetes Paternal Grandmother    Diabetes Maternal Uncle    Heart disease Paternal Uncle    Hyperlipidemia Other    Hypertension Other    Stroke Other    Cancer Other     Social History   Socioeconomic History   Marital status: Married    Spouse name: Not on file   Number of children: 4   Years of education: Not on file   Highest education level: Not on file  Occupational History   Occupation: research admn.  Tobacco Use   Smoking status: Never    Passive exposure: Never   Smokeless tobacco: Never  Vaping Use   Vaping status: Never Used  Substance and Sexual Activity   Alcohol use: No   Drug use: No   Sexual activity: Yes  Other Topics Concern   Not on file  Social History Narrative   Living in [place] with husband and two adopted children 15 and 14. Currently on FMLA.  She is not employed at this time. She has been her 2 youngest  adopted children at home. Not involved a lot else here at this time. She has 2 other children also. She walks a little bit but does not do a lot since she has left her with fatigue.   Social Drivers of Corporate investment banker Strain: Not on file  Food Insecurity: Not on file  Transportation Needs: Not on file  Physical Activity: Not on file  Stress: Stress Concern Present (05/19/2023)   [employer] of Occupational Health - Occupational Stress Questionnaire    Feeling of Stress : Rather much  Social Connections:  Unknown (05/19/2023)   Social Connection and Isolation Panel [NHANES]    Frequency of Communication with Friends and Family: More than three times a week    Frequency of Social Gatherings with Friends and Family: More than three times a week    Attends Religious Services: More than 4 times per year    Active Member of [employer] or Organizations: Not on file    Attends Banker Meetings: Not on file    Marital Status: Married  Intimate Partner Violence: Unknown (02/26/2022)   Received from [employer], [facility]   HITS    Physically Hurt: Not on file    Insult or Talk Down To: Not on file    Threaten Physical Harm: Not on file    Scream or Curse: Not on file    Review of Systems:    Constitutional: No weight loss, fever, chills, weakness or fatigue HEENT: Eyes: No change in vision               Ears, Nose, Throat:  No change in hearing or congestion Skin: No rash or itching Cardiovascular: No chest pain, chest pressure or palpitations   Respiratory: No SOB or cough Gastrointestinal: See HPI and otherwise negative Genitourinary: No dysuria or change in urinary frequency Neurological: No headache, dizziness or syncope Musculoskeletal: No new muscle or joint pain Hematologic: No bleeding or bruising Psychiatric: No history of depression or anxiety    Physical Exam:  Vital signs: LMP 11/10/2018 (Approximate)   Constitutional: NAD, alert and  cooperative Head:  Normocephalic and atraumatic. Eyes:   PEERL, EOMI. No icterus. Conjunctiva pink. Respiratory: Respirations even and unlabored. Lungs clear to auscultation bilaterally.   No wheezes, crackles, or rhonchi.  Cardiovascular:  Regular rate and rhythm. No peripheral edema, cyanosis or pallor.  Gastrointestinal:  Soft, nondistended, nontender. No rebound or guarding. Normal bowel sounds. No appreciable masses or hepatomegaly. Rectal:  Declines Msk:  Symmetrical without gross deformities. Without edema, no deformity or joint abnormality.  Neurologic:  Alert and  oriented x4;  grossly normal neurologically.  Skin:   Dry and intact without significant lesions or rashes. Psychiatric: Oriented to person, place and time. Demonstrates good judgement and reason without abnormal affect or behaviors.  Physical Exam    RELEVANT LABS AND IMAGING: CBC    Component Value Date/Time   WBC CANCELED 02/10/2024 1600   WBC 7.0 11/15/2023 1609   RBC CANCELED 02/10/2024 1600   RBC 4.48 11/15/2023 1609   HGB CANCELED 02/10/2024 1600   HCT CANCELED 02/10/2024 1600   PLT CANCELED 02/10/2024 1600   MCV 83.7 11/15/2023 1609   MCV 78.0 (A) 04/30/2012 1351   MCH 26.3 11/15/2023 1609   MCHC 31.5 11/15/2023 1609   RDW 13.5 11/15/2023 1609   LYMPHSABS CANCELED 02/10/2024 1600   MONOABS 0.6 11/15/2023 1609   EOSABS CANCELED 02/10/2024 1600   BASOSABS CANCELED 02/10/2024 1600    CMP     Component Value Date/Time   NA 143 02/10/2024 1600   K 5.2 02/10/2024 1600   CL 102 02/10/2024 1600   CO2 CANCELED 02/10/2024 1600   GLUCOSE 162 (H) 02/10/2024 1600   GLUCOSE 212 (H) 11/18/2023 1049   BUN 18 02/10/2024 1600   CREATININE 1.12 (H) 02/10/2024 1600   CREATININE 0.71 11/18/2023 1049   CALCIUM 10.1 02/10/2024 1600   PROT 8.3 02/10/2024 1600   ALBUMIN 5.0 (H) 02/10/2024 1600   AST 34 02/10/2024 1600   ALT 22 02/10/2024 1600   ALKPHOS 143 (  H) 02/10/2024 1600   BILITOT 1.6 (H) 02/10/2024 1600    GFRNONAA >60 11/15/2023 1609   GFRAA >60 11/17/2018 0758     Assessment/Plan:   Assessment and Plan Assessment & Plan        Gigi [facility] 03/13/2024, 8:21 PM  Cc: Redmon, Noelle, PA

## 2024-03-14 ENCOUNTER — Ambulatory Visit: Payer: BC Managed Care – PPO | Admitting: Gastroenterology

## 2024-03-15 ENCOUNTER — Encounter: Payer: Self-pay | Admitting: "Endocrinology

## 2024-03-15 ENCOUNTER — Telehealth (INDEPENDENT_AMBULATORY_CARE_PROVIDER_SITE_OTHER): Admitting: "Endocrinology

## 2024-03-15 DIAGNOSIS — E10649 Type 1 diabetes mellitus with hypoglycemia without coma: Secondary | ICD-10-CM

## 2024-03-15 DIAGNOSIS — Z9641 Presence of insulin pump (external) (internal): Secondary | ICD-10-CM | POA: Diagnosis not present

## 2024-03-15 DIAGNOSIS — E7800 Pure hypercholesterolemia, unspecified: Secondary | ICD-10-CM | POA: Diagnosis not present

## 2024-03-15 NOTE — Progress Notes (Signed)
 The patient reports they are currently: Mia Wilson. I spent 11 minutes on the video with the patient on the date of service. I spent an additional 5 minutes on pre- and post-visit activities on the date of service.   The patient was physically located in [place]  or a state in which I am permitted to provide care. The patient and/or parent/guardian understood that s/he may incur co-pays and cost sharing, and agreed to the telemedicine visit. The visit was reasonable and appropriate under the circumstances given the patient's presentation at the time.  The patient and/or parent/guardian has been advised of the potential risks and limitations of this mode of treatment (including, but not limited to, the absence of in-person examination) and has agreed to be treated using telemedicine. The patient's/patient's family's questions regarding telemedicine have been answered.   The patient and/or parent/guardian has also been advised to contact their provider's office for worsening conditions, and seek emergency medical treatment and/or call 911 if the patient deems either necessary.    Outpatient Endocrinology Note Jorge Newcomer, MD  03/15/24   Mia Wilson October 15, 1976 [id number]  Referring Provider: Diamond Formica, PA Primary Care Provider: de [place], Raymond J, MD Reason for consultation: Subjective   Assessment & Plan  Diagnoses and all orders for this visit:  Uncontrolled type 1 diabetes mellitus with hypoglycemia without coma (HCC)  Insulin  pump in place  Pure hypercholesterolemia   Diabetes Type I complicated by hyperglycemia C-pep 0.5, BG 212, Insulin  Ab + Lab Results  Component Value Date   GFR 92.07 10/13/2023   Hba1c goal less than 7, current Hba1c is  Lab Results  Component Value Date   HGBA1C 7.6 (A) 01/27/2024   Will recommend the following: OMNIPOD 5 insulin  pump BASAL RATES: Midnight = 0.3, 5 AM = 0.45, 9 AM = 0.3 and 7 PM = 0. 45 Bolus settings: I/C ratio 10  (5pm-12am: 10) ISF: 60, Target: 110, with correction over 150.  Insulin : Currently using Humalog   Stopped farxiga   No known contraindications/side effects to any of above medications Glucagon  discussed and prescribed with refills on 10/20/23  -Last LD and Tg are as follows: Lab Results  Component Value Date   LDLCALC 107 (H) 10/13/2023    Lab Results  Component Value Date   TRIG 44.0 10/13/2023   -On atorvastatin 10 mg QD -Follow low fat diet and exercise   -Blood pressure goal <140/90 - Microalbumin/creatinine goal is < 30 -Last MA/Cr is as follows: Lab Results  Component Value Date   MICROALBUR <0.7 10/13/2023   -not on ACE/ARB  -diet changes including salt restriction -limit eating outside -counseled BP targets per standards of diabetes care -uncontrolled blood pressure can lead to retinopathy, nephropathy and cardiovascular and atherosclerotic heart disease  Reviewed and counseled on: -A1C target -Blood sugar targets -Complications of uncontrolled diabetes  -Checking blood sugar before meals and bedtime and bring log next visit -All medications with mechanism of action and side effects -Hypoglycemia management: rule of 15's, Glucagon  Emergency Kit and medical alert ID -low-carb low-fat plate-method diet -At least 20 minutes of physical activity per day -Annual dilated retinal eye exam and foot exam -compliance and follow up needs -follow up as scheduled or earlier if problem gets worse  Call if blood sugar is less than 70 or consistently above 250    Take a 15 gm snack of carbohydrate at bedtime before you go to sleep if your blood sugar is less than 100.    If you are going  to fast after midnight for a test or procedure, ask your physician for instructions on how to reduce/decrease your insulin  dose.    Call if blood sugar is less than 70 or consistently above 250  -Treating a low sugar by rule of 15  (15 gms of sugar every 15 min until sugar is more than  70) If you feel your sugar is low, test your sugar to be sure If your sugar is low (less than 70), then take 15 grams of a fast acting Carbohydrate (3-4 glucose tablets or glucose gel or 4 ounces of juice or regular soda) Recheck your sugar 15 min after treating low to make sure it is more than 70 If sugar is still less than 70, treat again with 15 grams of carbohydrate          Don't drive the hour of hypoglycemia  If unconscious/unable to eat or drink by mouth, use glucagon  injection or nasal spray baqsimi and call 911. Can repeat again in 15 min if still unconscious.  Return in about 2 months (around 05/15/2024).   I have reviewed current medications, nurse's notes, allergies, vital signs, past medical and surgical history, family medical history, and social history for this encounter. Counseled patient on symptoms, examination findings, lab findings, imaging results, treatment decisions and monitoring and prognosis. The patient understood the recommendations and agrees with the treatment plan. All questions regarding treatment plan were fully answered.  Jorge Newcomer, MD  03/15/24   History of Present Illness Mia Wilson is a 48 y.o. year old female who presents for evaluation of Type II diabetes mellitus.  Tamya TEONIA YAGER was first diagnosed in 2009.   Diabetes education +  Diagnosis: date of diagnosis: 2009, initially asymptomatic  RECENT history:   Current treatment regimen:  OMNIPOD 5 insulin  pump BASAL RATES: Midnight = 0.3, 5 AM = 0.45, 9 AM = 0.3 and 7 PM = 0. 45 Bolus settings: I/C ratio 10 (5pm-12am: 8) ISF: 60, Target: 110, with correction over 150.  Insulin : Currently using Humalog   Non-insulin  hypoglycemic drugs: stopped Farxiga  10 mg daily  COMPLICATIONS -  MI/Stroke -  retinopathy -  neuropathy -  nephropathy  BLOOD SUGAR DATA  CGM interpretation: At today's visit, we reviewed her CGM downloads. The full report is scanned in the media. Reviewing  the CGM trends, BG are mildly elevated after dinner>after lunch.    Physical Exam  LMP 11/10/2018 (Approximate)    Constitutional: well developed, well nourished Head: normocephalic, atraumatic Eyes: sclera anicteric, no redness Neck: supple Lungs: normal respiratory effort Neurology: alert and oriented Skin: dry, no appreciable rashes Musculoskeletal: no appreciable defects Psychiatric: normal mood and affect Diabetic Foot Exam - Simple   No data filed      Current Medications Patient's Medications  New Prescriptions   No medications on file  Previous Medications   ATOMOXETINE  (STRATTERA ) 60 MG CAPSULE    TAKE 1 CAPSULE BY MOUTH EVERY DAY   ATORVASTATIN (LIPITOR) 10 MG TABLET    Take 10 mg by mouth daily.   BLOOD GLUCOSE MONITORING SUPPL (ONETOUCH VERIO FLEX SYSTEM) W/DEVICE KIT    CHECK BLOOD SUGAR BEFORE AND AFTER EACH MEAL   CONTINUOUS GLUCOSE SENSOR (DEXCOM G6 SENSOR) MISC    USE AS INSTRUCTED CHANGE EVERY 10 DAYS   CONTINUOUS GLUCOSE TRANSMITTER (DEXCOM G6 TRANSMITTER) MISC    AS DIRECTED CHANGE EVERY 90 DAYS   DEXTROMETHORPHAN-BUPROPION ER (AUVELITY ) 45-105 MG TBCR    Take one tablet by mouth two times  daily at least 8 hours between doses   DULOXETINE  (CYMBALTA ) 60 MG CAPSULE    Take 1 capsule (60 mg total) by mouth daily.   ESTRADIOL (CLIMARA - DOSED IN MG/24 HR) 0.0375 MG/24HR PATCH    Place 0.0375 mg onto the skin once a week.   GLUCAGON  (GVOKE HYPOPEN  1-PACK) 1 MG/0.2ML SOAJ    Inject 1 mg into the skin as needed (low blood sugar with impaired consciousness).   INSULIN  DISPOSABLE PUMP (OMNIPOD 5 G7 PODS, GEN 5,) MISC    Inject 1 Device into the skin every 3 (three) days. 1 DEVICE BY DOES NOT APPLY ROUTE EVERY 3 (THREE) DAYS.   INSULIN  GLARGINE (LANTUS  SOLOSTAR) 100 UNIT/ML SOLOSTAR PEN    Inject 14 Units into the skin daily. In case of pump failure   INSULIN  LISPRO (HUMALOG ) 100 UNIT/ML INJECTION    INJECT UPTO 50 UNITS INTO THE SKIN DAILY. USE MAX OF 50 UNITS DAILY VIA  INSULIN  PUMP   LANCETS (ONETOUCH DELICA PLUS LANCET30G) MISC    1 each by Does not apply route See admin instructions. Use Onetouch Delica Plus lancets to check blood sugar before and after each meal.   MULTIPLE VITAMIN (MULTIVITAMIN WITH MINERALS) TABS    Take 1 tablet by mouth daily.   ONDANSETRON  (ZOFRAN ) 8 MG TABLET    Take 1 tablet (8 mg total) by mouth every 8 (eight) hours as needed for nausea or vomiting.   ONETOUCH VERIO TEST STRIP    USE ONETOUCH VERIO FLEX AS INSTRUCTED TO CHECK BLOOD SUGAR BEFORE AND AFTER EACH MEAL.   PANTOPRAZOLE  (PROTONIX ) 40 MG TABLET    Take 1 tablet (40 mg total) by mouth daily.   PROGESTERONE (PROMETRIUM) 100 MG CAPSULE       TIRZEPATIDE (MOUNJARO) 2.5 MG/0.5ML PEN    Inject 2.5 mg into the skin once a week.   VITAMIN B-12 (CYANOCOBALAMIN ) 1000 MCG TABLET    Take 1,000 mcg by mouth daily.  Modified Medications   No medications on file  Discontinued Medications   No medications on file    Allergies Allergies  Allergen Reactions   Mounjaro [Tirzepatide] Diarrhea and Nausea And Vomiting    Lost 15 pounds in 1 week.  Required IV fluids   Other Anaphylaxis and Hives    Peaches , tomatoes, strawberry   Bupropion     Other Reaction(s): doesn't tolerate doses over 300mg    Strawberry Extract Hives    Facial hives and mouth hives   Tomato Hives    Facial hives and mouth hives    Past Medical History 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)    Fibroids    PCOS (polycystic ovarian syndrome)     Past Surgical History Past Surgical History:  Procedure Laterality Date   BREAST EXCISIONAL BIOPSY Right    BREAST SURGERY     benign   IR ANGIOGRAM PELVIS SELECTIVE OR SUPRASELECTIVE  11/17/2018   IR ANGIOGRAM PELVIS SELECTIVE OR SUPRASELECTIVE  11/17/2018   IR ANGIOGRAM SELECTIVE EACH ADDITIONAL VESSEL  11/17/2018   IR ANGIOGRAM SELECTIVE EACH ADDITIONAL VESSEL  11/17/2018   IR EMBO TUMOR ORGAN ISCHEMIA  INFARCT INC GUIDE ROADMAPPING  11/17/2018   IR RADIOLOGIST EVAL & MGMT  09/14/2018   IR RADIOLOGIST EVAL & MGMT  12/13/2018   IR RADIOLOGIST EVAL & MGMT  06/28/2019   IR US  GUIDE VASC ACCESS RIGHT  11/17/2018   UTERINE FIBROID SURGERY  Family History family history includes Breast cancer in her maternal grandmother; Cancer in an other family member; Diabetes in her maternal grandmother, maternal uncle, and paternal grandmother; Healthy in her father and mother; Heart disease in her maternal grandmother and paternal uncle; Hyperlipidemia in an other family member; Hypertension in an other family member; Stroke in an other family member.  Social History Social History   Socioeconomic History   Marital status: Married    Spouse name: Not on file   Number of children: 4   Years of education: Not on file   Highest education level: Not on file  Occupational History   Occupation: research admn.  Tobacco Use   Smoking status: Never    Passive exposure: Never   Smokeless tobacco: Never  Vaping Use   Vaping status: Never Used  Substance and Sexual Activity   Alcohol use: No   Drug use: No   Sexual activity: Yes  Other Topics Concern   Not on file  Social History Narrative   Living in [place] with husband and two adopted children 15 and 14. Currently on FMLA.  She is not employed at this time. She has been her 2 youngest adopted children at home. Not involved a lot else here at this time. She has 2 other children also. She walks a little bit but does not do a lot since she has left her with fatigue.   Social Drivers of Corporate investment banker Strain: Not on file  Food Insecurity: Not on file  Transportation Needs: Not on file  Physical Activity: Not on file  Stress: Stress Concern Present (05/19/2023)   [employer] of Occupational Health - Occupational Stress Questionnaire    Feeling of Stress : Rather much  Social Connections: Unknown (05/19/2023)   Social Connection  and Isolation Panel [NHANES]    Frequency of Communication with Friends and Family: More than three times a week    Frequency of Social Gatherings with Friends and Family: More than three times a week    Attends Religious Services: More than 4 times per year    Active Member of [employer] or Organizations: Not on file    Attends Banker Meetings: Not on file    Marital Status: Married  Intimate Partner Violence: Unknown (02/26/2022)   Received from [employer], [facility]   HITS    Physically Hurt: Not on file    Insult or Talk Down To: Not on file    Threaten Physical Harm: Not on file    Scream or Curse: Not on file    Lab Results  Component Value Date   HGBA1C 7.6 (A) 01/27/2024   HGBA1C 8.6 (H) 10/13/2023   HGBA1C 7.3 (H) 04/30/2023   Lab Results  Component Value Date   CHOL 206 (H) 10/13/2023   Lab Results  Component Value Date   HDL 89.60 10/13/2023   Lab Results  Component Value Date   LDLCALC 107 (H) 10/13/2023   Lab Results  Component Value Date   TRIG 44.0 10/13/2023   Lab Results  Component Value Date   CHOLHDL 2 10/13/2023   Lab Results  Component Value Date   CREATININE 1.12 (H) 02/10/2024   Lab Results  Component Value Date   GFR 92.07 10/13/2023   Lab Results  Component Value Date   MICROALBUR <0.7 10/13/2023      Component Value Date/Time   NA 143 02/10/2024 1600   K 5.2 02/10/2024 1600   CL 102 02/10/2024  1600   CO2 CANCELED 02/10/2024 1600   GLUCOSE 162 (H) 02/10/2024 1600   GLUCOSE 212 (H) 11/18/2023 1049   BUN 18 02/10/2024 1600   CREATININE 1.12 (H) 02/10/2024 1600   CREATININE 0.71 11/18/2023 1049   CALCIUM 10.1 02/10/2024 1600   PROT 8.3 02/10/2024 1600   ALBUMIN 5.0 (H) 02/10/2024 1600   AST 34 02/10/2024 1600   ALT 22 02/10/2024 1600   ALKPHOS 143 (H) 02/10/2024 1600   BILITOT 1.6 (H) 02/10/2024 1600   GFRNONAA >60 11/15/2023 1609   GFRAA >60 11/17/2018 0758      Latest Ref Rng & Units 02/10/2024    4:00  PM 11/18/2023   10:49 AM 11/15/2023    4:09 PM  BMP  Glucose 70 - 99 mg/dL 086  578  469   BUN 6 - 24 mg/dL 18  11  11    Creatinine 0.57 - 1.00 mg/dL 6.29  5.28  4.13   BUN/Creat Ratio 9 - 23 16  SEE NOTE:    Sodium 134 - 144 mmol/L 143  140  134   Potassium 3.5 - 5.2 mmol/L 5.2  4.4  3.9   Chloride 96 - 106 mmol/L 102  103  103   CO2 mmol/L CANCELED  24  22   Calcium 8.7 - 10.2 mg/dL 24.4  9.3  8.8        Component Value Date/Time   WBC CANCELED 02/10/2024 1600   WBC 7.0 11/15/2023 1609   RBC CANCELED 02/10/2024 1600   RBC 4.48 11/15/2023 1609   HGB CANCELED 02/10/2024 1600   HCT CANCELED 02/10/2024 1600   PLT CANCELED 02/10/2024 1600   MCV 83.7 11/15/2023 1609   MCV 78.0 (A) 04/30/2012 1351   MCH 26.3 11/15/2023 1609   MCHC 31.5 11/15/2023 1609   RDW 13.5 11/15/2023 1609   LYMPHSABS CANCELED 02/10/2024 1600   MONOABS 0.6 11/15/2023 1609   EOSABS CANCELED 02/10/2024 1600   BASOSABS CANCELED 02/10/2024 1600     Parts of this note may have been dictated using voice recognition software. There may be variances in spelling and vocabulary which are unintentional. Not all errors are proofread. Please notify the [facility] if any discrepancies are noted or if the meaning of any statement is not clear.

## 2024-03-15 NOTE — Patient Instructions (Signed)

## 2024-03-17 ENCOUNTER — Encounter: Payer: Self-pay | Admitting: Professional Counselor

## 2024-03-17 ENCOUNTER — Ambulatory Visit: Payer: BC Managed Care – PPO | Admitting: Professional Counselor

## 2024-03-17 ENCOUNTER — Encounter: Payer: Self-pay | Admitting: "Endocrinology

## 2024-03-17 DIAGNOSIS — F330 Major depressive disorder, recurrent, mild: Secondary | ICD-10-CM | POA: Diagnosis not present

## 2024-03-17 DIAGNOSIS — F411 Generalized anxiety disorder: Secondary | ICD-10-CM

## 2024-03-17 NOTE — Progress Notes (Signed)
      [facility] Counselor/Therapist Progress Note  [name], MRN: [id number],    Date: 03/17/2024  Time Spent: 9:15 AM to 10:14 AM  Treatment Type: Individual Therapy  Reported Symptoms: Grief/loss, worries, stress, interpersonal concerns, hypervigilance, health concerns, intermittent low mood  Mental Status Exam:  Appearance:   Neat     Behavior:  Appropriate, Sharing, and Motivated  Motor:  Normal  Speech/Language:   Clear and Coherent and Normal Rate  Affect:  Appropriate and Congruent  Mood:  normal  Thought process:  normal  Thought content:    WNL  Sensory/Perceptual disturbances:    WNL  Orientation:  oriented to person, place, time/date, and situation  Attention:  Good  Concentration:  Good  Memory:  WNL  Fund of knowledge:   Good  Insight:    Good  Judgment:   Good  Impulse Control:  Good   Risk Assessment: Danger to Self:  No Self-injurious Behavior: No Danger to Others: No Duty to Warn:no Physical Aggression / Violence:No  Access to Firearms a concern: No  Gang Involvement:No   Subjective: Patient presented to session to address concerns of anxiety and depression.  She reported mixed progress at this time.  Patient identified feeling that her medication feels like "too much" and she intends to follow-up with her prescribing provider to address her concerns.  She reported helpfulness in mitigation of symptoms by virtue of parenting concerns to be minimal at this time.  She processed experience of dreams in which she is protecting them, and counselor helped facilitate insight into patient sense of her parenting role and responsibility and meaning in an impact to her life.  Patient voiced continuing to struggle with experience of grief and loss after her mother-in-law's death, and increase in work stress.  She reported having been on vacation with her parents and for this to be restorative.  Counselor affirmed patient positivity and resourcing.   Patient processed experience of marital concerns and productive conversation with husband.  Counselor assisted in reinforcing positive takeaways from conversation, and encouraged couples counseling regarding concerns.  Patient voiced desire to increase exercise goal, and counselor and patient discussed resourcing of "atomic habits" approach to goal pursuit.  Interventions: Solution-Oriented/Positive Psychology, Humanistic/Existential, and Insight-Oriented  Diagnosis:   ICD-10-CM   1. Generalized anxiety disorder  F41.1     2. Major depressive disorder, recurrent episode, mild (HCC)  F33.0       Plan: Patient is scheduled for follow-up; continue process work and developing coping skills.  Patient short-term goal between sessions to continue to work on exercise goal, and continue to prioritize self-care, including increasing resourcing of support from others including husband, and limiting overfunctioning across spheres of life.  Progress note was dictated with Dragon and reviewed for accuracy.  Anthon Kins, [facility]

## 2024-03-22 ENCOUNTER — Encounter: Attending: Gastroenterology | Admitting: Dietician

## 2024-03-22 ENCOUNTER — Encounter: Payer: Self-pay | Admitting: Dietician

## 2024-03-22 DIAGNOSIS — E139 Other specified diabetes mellitus without complications: Secondary | ICD-10-CM | POA: Insufficient documentation

## 2024-03-22 NOTE — Patient Instructions (Signed)
 Goals Established by Patient:  Goal 1: go on a 10 minute walk around the neighborhood at least 3 days per week.   Goal 2: plan what you're going to eat the next day the night before.

## 2024-03-22 NOTE — Progress Notes (Signed)
 Diabetes Self-Management Education  Visit Type: First/Initial  Appt. Start Time: 1000 Appt. End Time: 1100  03/22/2024  [name], identified by name and date of birth, is a 48 y.o. female with a diagnosis of Diabetes: Type 2.   ASSESSMENT  History includes: type 2 diabetes, GERD Labs noted: 01/27/24 A1c 7.6 Medications include: lantus , humalog ,  Supplements: vitamin b12, MVI Allergies/intolerances: tomato, strawberry, eggplant, red apples, peaches. (All may cause hives) Omnipod & Dexcom G6  Pt reports she was vegan for a while but is currently vegetarian (pt enjoys beans, edamame, tofu, eggs, nuts/seeds, some dairy). Pt reports she feels she needs consistency with her diet. Pt reports she wakes up around 7am, eats lunch around 11am/12pm, and doesn't eat until dinner but often finds herself waiting too long to eat and then feels she overeats. Pt reports when she was vegan she felt like she would binge at night.   Pt reports she went unconscious in a restaurant with her family in December from giving herself too much insulin  and going low. Pt reports this scared her so she feels like sometimes when she is low now she over-treats and then goes high.   Pt states about a month ago she took mounjaro and was instantly sick, couldn't eat, nauseated, and lost 15 lb one week, Pt reports she stopped the medication but feels it reduced her cravings for sweets.   Pt reports in the house are her 2 teenage sons (5 and 75) and husband. Pt reports she also has 2 adult sons. Pt reports her teenage sons and husband all enjoy cooking.   Pt reports last night she waiting too long to eat and states she ate 1/2 of a loaf of pumpkin bread and 2 garlic knots.   Last menstrual period 11/10/2018. There is no height or weight on file to calculate BMI.   Diabetes Self-Management Education - 03/22/24 1002       Visit Information   Visit Type First/Initial      Initial Visit   Diabetes Type Type  2    Date Diagnosed unknown    Are you currently following a meal plan? No    Are you taking your medications as prescribed? Yes      Health Coping   How would you rate your overall health? Good      Psychosocial Assessment   Patient Belief/Attitude about Diabetes Motivated to manage diabetes    What is the hardest part about your diabetes right now, causing you the most concern, or is the most worrisome to you about your diabetes?   Making healty food and beverage choices    Self-care barriers None    Self-management support Doctor's office    Other persons present Patient    Patient Concerns Nutrition/Meal planning    Special Needs None    Preferred Learning Style No preference indicated    Learning Readiness Ready    How often do you need to have someone help you when you read instructions, pamphlets, or other written materials from your doctor or pharmacy? 1 - Never    What is the last grade level you completed in school? masters      Pre-Education Assessment   Patient understands the diabetes disease and treatment process. Needs Instruction    Patient understands incorporating nutritional management into lifestyle. Needs Instruction    Patient undertands incorporating physical activity into lifestyle. Needs Instruction    Patient understands using medications safely. Needs Instruction    Patient  understands monitoring blood glucose, interpreting and using results Needs Instruction    Patient understands prevention, detection, and treatment of acute complications. Needs Instruction    Patient understands prevention, detection, and treatment of chronic complications. Needs Instruction    Patient understands how to develop strategies to address psychosocial issues. Needs Instruction    Patient understands how to develop strategies to promote health/change behavior. Needs Instruction      Complications   Last HgB A1C per patient/outside source 7.6 %    How often do you check your  blood sugar? > 4 times/day    Have you had a dilated eye exam in the past 12 months? Yes    Have you had a dental exam in the past 12 months? No    Are you checking your feet? Yes      Dietary Intake   Breakfast none    Snack (morning) none    Lunch 11am: chopt greek salad    Snack (afternoon) none OR vegan yogurt    Dinner pumpkin bread 1/2 loaf and 2 garlic knots    Snack (evening) none    Beverage(s) water, caffeinated hot tea, herbal tea      Activity / Exercise   Activity / Exercise Type ADL's      Patient Education   Previous Diabetes Education Yes (please comment)    Disease Pathophysiology Explored patient's options for treatment of their diabetes    Healthy Eating Role of diet in the treatment of diabetes and the relationship between the three main macronutrients and blood glucose level;Plate Method;Meal options for control of blood glucose level and chronic complications.;Meal timing in regards to the patients' current diabetes medication.    Being Active Role of exercise on diabetes management, blood pressure control and cardiac health.    Medications Reviewed patients medication for diabetes, action, purpose, timing of dose and side effects.    Monitoring Identified appropriate SMBG and/or A1C goals.    Acute complications Taught prevention, symptoms, and  treatment of hypoglycemia - the 15 rule.    Chronic complications Relationship between chronic complications and blood glucose control;Identified and discussed with patient  current chronic complications    Diabetes Stress and Support Identified and addressed patients feelings and concerns about diabetes;Worked with patient to identify barriers to care and solutions;Role of stress on diabetes    Lifestyle and Health Coping Lifestyle issues that need to be addressed for better diabetes care      Individualized Goals (developed by patient)   Nutrition General guidelines for healthy choices and portions discussed    Physical  Activity Exercise 3-5 times per week;15 minutes per day    Medications take my medication as prescribed    Monitoring  Test my blood glucose as discussed    Problem Solving Eating Pattern    Reducing Risk examine blood glucose patterns;do foot checks daily;treat hypoglycemia with 15 grams of carbs if blood glucose less than 70mg /dL    Health Coping Ask for help with psychological, social, or emotional issues      Post-Education Assessment   Patient understands the diabetes disease and treatment process. Comprehends key points    Patient understands incorporating nutritional management into lifestyle. Comprehends key points    Patient undertands incorporating physical activity into lifestyle. Comprehends key points    Patient understands using medications safely. Comphrehends key points    Patient understands monitoring blood glucose, interpreting and using results Comprehends key points    Patient understands prevention, detection, and treatment of  acute complications. Comprehends key points    Patient understands prevention, detection, and treatment of chronic complications. Comprehends key points    Patient understands how to develop strategies to address psychosocial issues. Comprehends key points    Patient understands how to develop strategies to promote health/change behavior. Comprehends key points      Outcomes   Expected Outcomes Demonstrated interest in learning. Expect positive outcomes    Future DMSE 4-6 wks    Program Status Not Completed             Individualized Plan for Diabetes Self-Management Training:   Learning Objective:  Patient will have a greater understanding of diabetes self-management. Patient education plan is to attend individual and/or group sessions per assessed needs and concerns.   Plan:   Patient Instructions  Goals Established by Patient:  Goal 1: go on a 10 minute walk around the neighborhood at least 3 days per week.   Goal 2: plan what  you're going to eat the next day the night before.   Expected Outcomes:  Demonstrated interest in learning. Expect positive outcomes  Education material provided: My Plate (email)  If problems or questions, patient to contact team via:  Phone  Future DSME appointment: 4-6 wks

## 2024-03-29 DIAGNOSIS — N958 Other specified menopausal and perimenopausal disorders: Secondary | ICD-10-CM | POA: Diagnosis not present

## 2024-03-29 DIAGNOSIS — N951 Menopausal and female climacteric states: Secondary | ICD-10-CM | POA: Diagnosis not present

## 2024-03-31 ENCOUNTER — Encounter: Payer: Self-pay | Admitting: Professional Counselor

## 2024-03-31 ENCOUNTER — Ambulatory Visit: Payer: BC Managed Care – PPO | Admitting: Professional Counselor

## 2024-03-31 DIAGNOSIS — F330 Major depressive disorder, recurrent, mild: Secondary | ICD-10-CM | POA: Diagnosis not present

## 2024-03-31 DIAGNOSIS — F411 Generalized anxiety disorder: Secondary | ICD-10-CM

## 2024-03-31 NOTE — Progress Notes (Signed)
      [facility] Counselor/Therapist Progress Note  [name], MRN: [id number],    Date: 03/31/2024  Time Spent: 9:10 AM to 10:11 AM  Treatment Type: Individual Therapy  Reported Symptoms: Grief/loss, hypervigilance, stress, parenting concerns, worries, sadness, frustration, trauma memories and response pattern  Mental Status Exam:  Appearance:   Casual     Behavior:  Appropriate, Sharing, and Motivated  Motor:  Normal  Speech/Language:   Clear and Coherent and Normal Rate  Affect:  Appropriate and Congruent  Mood:  normal  Thought process:  normal  Thought content:    WNL  Sensory/Perceptual disturbances:    WNL  Orientation:  oriented to person, place, time/date, and situation  Attention:  Good  Concentration:  Good  Memory:  WNL  Fund of knowledge:   Good  Insight:    Good  Judgment:   Good  Impulse Control:  Good   Risk Assessment: Danger to Self:  No Self-injurious Behavior: No Danger to Others: No Duty to Warn:no Physical Aggression / Violence:No  Access to Firearms a concern: No  Gang Involvement:No   Subjective: Patient presented to session to address concerns of anxiety and depression.  She reported mixed progress at this time.  She reported loss of a friend to a heart attack, and processed her grief and her loss.  Counselor affirmed patient feelings and held space for patient grief.  Patient processed the experience of a trauma trigger that led to trauma response pattern as relates a primary parenting concern, with related hypervigilance, and she discerned proactive opportunities she and husband are considering to help improve the circumstance.  Counselor assisted with patient discernment and helped facilitate insight and discuss strategies regarding systemic concerns.  Counselor provided ideas for referrals for patient family members, and provided ideas for other resources in the event of heightened concern.  Patient signed treatment plan for  which she had given verbal consent prior, due to technological glitch now fixed.   Interventions: Solution-Oriented/Positive Psychology, Humanistic/Existential, Grief Therapy, Insight-Oriented, and Resourcing  Diagnosis:   ICD-10-CM   1. Generalized anxiety disorder  F41.1     2. Major depressive disorder, recurrent episode, mild (HCC)  F33.0       Plan: Patient is scheduled for follow-up; continue process work and developing coping skills.  STG between sessions for patient to follow-up on support resources, and continue to maintain boundaries and safety measures, and prioritize self-care.  Mia Wilson, [facility]

## 2024-04-12 ENCOUNTER — Ambulatory Visit: Admitting: Gastroenterology

## 2024-04-12 NOTE — Progress Notes (Deleted)
 Chief Complaint: Follow up Primary GI MD: Dr. Venice Gillis  HPI: 48 year old female history anxiety, depression, PCOS, diabetes, presents for follow-up  Last seen 01/17/2024.  See this note for details.  History of chronic constipation and lower abdominal cramping improved with MiraLAX .  History of epigastric discomfort, nausea, eructation and provided with pantoprazole  40 Mg once daily   Patient seen at urgent care 02/10/2024 for reaction to Mounjaro.  Started Mounjaro for her history of uncontrolled diabetes and since starting it she had nausea, diarrhea, and lost 15 pounds over the course of several days and was found to be dehydrated.  She was given Zofran  and 1000 mL saline with improvement  CMP revealed elevated alk phos of 143 and T. bili of 1.6.  AST/ALT normal.  Lipase normal.  Discussed the use of AI scribe software for clinical note transcription with the patient, who gave verbal consent to proceed.  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)    Fibroids    PCOS (polycystic ovarian syndrome)     Past Surgical History:  Procedure Laterality Date   BREAST EXCISIONAL BIOPSY Right    BREAST SURGERY     benign   IR ANGIOGRAM PELVIS SELECTIVE OR SUPRASELECTIVE  11/17/2018   IR ANGIOGRAM PELVIS SELECTIVE OR SUPRASELECTIVE  11/17/2018   IR ANGIOGRAM SELECTIVE EACH ADDITIONAL VESSEL  11/17/2018   IR ANGIOGRAM SELECTIVE EACH ADDITIONAL VESSEL  11/17/2018   IR EMBO TUMOR ORGAN ISCHEMIA INFARCT INC GUIDE ROADMAPPING  11/17/2018   IR RADIOLOGIST EVAL & MGMT  09/14/2018   IR RADIOLOGIST EVAL & MGMT  12/13/2018   IR RADIOLOGIST EVAL & MGMT  06/28/2019   IR US  GUIDE VASC  ACCESS RIGHT  11/17/2018   UTERINE FIBROID SURGERY      Current Outpatient Medications  Medication Sig Dispense Refill   atomoxetine  (STRATTERA ) 60 MG capsule TAKE 1 CAPSULE BY MOUTH EVERY DAY 90 capsule 0   atorvastatin (LIPITOR) 10 MG tablet Take 10 mg by mouth daily.     Blood Glucose Monitoring Suppl (ONETOUCH VERIO FLEX SYSTEM) w/Device KIT CHECK BLOOD SUGAR BEFORE AND AFTER EACH MEAL 1 kit 0   Continuous Glucose Sensor (DEXCOM G6 SENSOR) MISC USE AS INSTRUCTED CHANGE EVERY 10 DAYS 9 each 3   Continuous Glucose Transmitter (DEXCOM G6 TRANSMITTER) MISC AS DIRECTED CHANGE EVERY 90 DAYS 1 each 3   Dextromethorphan-buPROPion ER (AUVELITY ) 45-105 MG TBCR Take one tablet by mouth two times daily at least 8 hours between doses 60 tablet 3   DULoxetine  (CYMBALTA ) 60 MG capsule Take 1 capsule (60 mg total) by mouth daily. 90 capsule 1   estradiol (CLIMARA - DOSED IN MG/24 HR) 0.0375 mg/24hr patch Place 0.0375 mg onto the skin once a week.     Glucagon  (GVOKE HYPOPEN  1-PACK) 1 MG/0.2ML SOAJ Inject 1 mg into the skin as needed (low blood sugar with impaired consciousness). 0.4 mL 2   Insulin  Disposable Pump (OMNIPOD 5 G7 PODS, GEN 5,) MISC Inject 1 Device into the skin every 3 (three) days. 1 DEVICE BY DOES NOT APPLY ROUTE EVERY 3 (THREE) DAYS. 30 each 2   insulin  glargine (LANTUS  SOLOSTAR) 100 UNIT/ML Solostar Pen Inject  14 Units into the skin daily. In case of pump failure 6 mL 1   insulin  lispro (HUMALOG ) 100 UNIT/ML injection INJECT UPTO 50 UNITS INTO THE SKIN DAILY. USE MAX OF 50 UNITS DAILY VIA INSULIN  PUMP 75 mL 3   Lancets (ONETOUCH DELICA PLUS LANCET30G) MISC 1 each by Does not apply route See admin instructions. Use Onetouch Delica Plus lancets to check blood sugar before and after each meal. 200 each 3   Multiple Vitamin (MULTIVITAMIN WITH MINERALS) TABS Take 1 tablet by mouth daily.     ondansetron  (ZOFRAN ) 8 MG tablet Take 1 tablet (8 mg total) by mouth every 8 (eight) hours as needed for  nausea or vomiting. 20 tablet 0   ONETOUCH VERIO test strip USE ONETOUCH VERIO FLEX AS INSTRUCTED TO CHECK BLOOD SUGAR BEFORE AND AFTER EACH MEAL. 200 strip 3   pantoprazole  (PROTONIX ) 40 MG tablet Take 1 tablet (40 mg total) by mouth daily. 30 tablet 2   progesterone (PROMETRIUM) 100 MG capsule      tirzepatide (MOUNJARO) 2.5 MG/0.5ML Pen Inject 2.5 mg into the skin once a week. 2 mL 0   vitamin B-12 (CYANOCOBALAMIN ) 1000 MCG tablet Take 1,000 mcg by mouth daily.     No current facility-administered medications for this visit.    Allergies as of 04/12/2024 - Review Complete 03/31/2024  Allergen Reaction Noted   Mounjaro [tirzepatide] Diarrhea and Nausea And Vomiting 02/10/2024   Other Anaphylaxis and Hives 04/08/2016   Bupropion  02/08/2024   Strawberry extract Hives 11/08/2013   Tomato Hives 11/08/2013    Family History  Problem Relation Age of Onset   Healthy Mother    Healthy Father    Diabetes Maternal Grandmother    Heart disease Maternal Grandmother    Breast cancer Maternal Grandmother        diagnosed in her 42's   Diabetes Paternal Grandmother    Diabetes Maternal Uncle    Heart disease Paternal Uncle    Hyperlipidemia Other    Hypertension Other    Stroke Other    Cancer Other     Social History   Socioeconomic History   Marital status: Married    Spouse name: Not on file   Number of children: 4   Years of education: Not on file   Highest education level: Not on file  Occupational History   Occupation: research admn.  Tobacco Use   Smoking status: Never    Passive exposure: Never   Smokeless tobacco: Never  Vaping Use   Vaping status: Never Used  Substance and Sexual Activity   Alcohol use: No   Drug use: No   Sexual activity: Yes  Other Topics Concern   Not on file  Social History Narrative   Living in [place] with husband and two adopted children 15 and 14. Currently on FMLA.  She is not employed at this time. She has been her 2 youngest  adopted children at home. Not involved a lot else here at this time. She has 2 other children also. She walks a little bit but does not do a lot since she has left her with fatigue.   Social Drivers of Corporate investment banker Strain: Not on file  Food Insecurity: No Food Insecurity (03/22/2024)   Hunger Vital Sign    Worried About Running Out of Food in the Last Year: Never true    Ran Out of Food in the Last Year: Never true  Transportation Needs: Not on file  Physical Activity: Not on file  Stress: Stress Concern Present (05/19/2023)   [employer] of Occupational Health - Occupational Stress Questionnaire    Feeling of Stress : Rather much  Social Connections: Unknown (05/19/2023)   Social Connection and Isolation Panel [NHANES]    Frequency of Communication with Friends and Family: More than three times a week    Frequency of Social Gatherings with Friends and Family: More than three times a week    Attends Religious Services: More than 4 times per year    Active Member of [employer] or Organizations: Not on file    Attends Banker Meetings: Not on file    Marital Status: Married  Intimate Partner Violence: Unknown (02/26/2022)   Received from [employer], [facility]   HITS    Physically Hurt: Not on file    Insult or Talk Down To: Not on file    Threaten Physical Harm: Not on file    Scream or Curse: Not on file    Review of Systems:    Constitutional: No weight loss, fever, chills, weakness or fatigue HEENT: Eyes: No change in vision               Ears, Nose, Throat:  No change in hearing or congestion Skin: No rash or itching Cardiovascular: No chest pain, chest pressure or palpitations   Respiratory: No SOB or cough Gastrointestinal: See HPI and otherwise negative Genitourinary: No dysuria or change in urinary frequency Neurological: No headache, dizziness or syncope Musculoskeletal: No new muscle or joint pain Hematologic: No bleeding or  bruising Psychiatric: No history of depression or anxiety    Physical Exam:  Vital signs: LMP 11/10/2018 (Approximate)   Constitutional: NAD, alert and cooperative Head:  Normocephalic and atraumatic. Eyes:   PEERL, EOMI. No icterus. Conjunctiva pink. Respiratory: Respirations even and unlabored. Lungs clear to auscultation bilaterally.   No wheezes, crackles, or rhonchi.  Cardiovascular:  Regular rate and rhythm. No peripheral edema, cyanosis or pallor.  Gastrointestinal:  Soft, nondistended, nontender. No rebound or guarding. Normal bowel sounds. No appreciable masses or hepatomegaly. Rectal:  Declines Msk:  Symmetrical without gross deformities. Without edema, no deformity or joint abnormality.  Neurologic:  Alert and  oriented x4;  grossly normal neurologically.  Skin:   Dry and intact without significant lesions or rashes. Psychiatric: Oriented to person, place and time. Demonstrates good judgement and reason without abnormal affect or behaviors.  Physical Exam    RELEVANT LABS AND IMAGING: CBC    Component Value Date/Time   WBC CANCELED 02/10/2024 1600   WBC 7.0 11/15/2023 1609   RBC CANCELED 02/10/2024 1600   RBC 4.48 11/15/2023 1609   HGB CANCELED 02/10/2024 1600   HCT CANCELED 02/10/2024 1600   PLT CANCELED 02/10/2024 1600   MCV 83.7 11/15/2023 1609   MCV 78.0 (A) 04/30/2012 1351   MCH 26.3 11/15/2023 1609   MCHC 31.5 11/15/2023 1609   RDW 13.5 11/15/2023 1609   LYMPHSABS CANCELED 02/10/2024 1600   MONOABS 0.6 11/15/2023 1609   EOSABS CANCELED 02/10/2024 1600   BASOSABS CANCELED 02/10/2024 1600    CMP     Component Value Date/Time   NA 143 02/10/2024 1600   K 5.2 02/10/2024 1600   CL 102 02/10/2024 1600   CO2 CANCELED 02/10/2024 1600   GLUCOSE 162 (H) 02/10/2024 1600   GLUCOSE 212 (H) 11/18/2023 1049   BUN 18 02/10/2024 1600   CREATININE 1.12 (H) 02/10/2024 1600   CREATININE 0.71 11/18/2023 1049  CALCIUM 10.1 02/10/2024 1600   PROT 8.3 02/10/2024  1600   ALBUMIN 5.0 (H) 02/10/2024 1600   AST 34 02/10/2024 1600   ALT 22 02/10/2024 1600   ALKPHOS 143 (H) 02/10/2024 1600   BILITOT 1.6 (H) 02/10/2024 1600   GFRNONAA >60 11/15/2023 1609   GFRAA >60 11/17/2018 0758     Assessment/Plan:   Assessment and Plan Assessment & Plan        [facility] 04/12/2024, 8:17 AM  Cc: de [place], Raymond J, MD

## 2024-04-13 ENCOUNTER — Encounter: Payer: Self-pay | Admitting: Professional Counselor

## 2024-04-13 ENCOUNTER — Ambulatory Visit: Payer: BC Managed Care – PPO | Admitting: Professional Counselor

## 2024-04-13 DIAGNOSIS — F411 Generalized anxiety disorder: Secondary | ICD-10-CM | POA: Diagnosis not present

## 2024-04-13 DIAGNOSIS — F330 Major depressive disorder, recurrent, mild: Secondary | ICD-10-CM | POA: Diagnosis not present

## 2024-04-13 NOTE — Progress Notes (Signed)
      [facility] Counselor/Therapist Progress Note  [name], MRN: [id number],    Date: 04/13/2024  Time Spent: 9:10 AM to 10:09 AM  Treatment Type: Individual Therapy  Reported Symptoms: grief/loss, intermittent experience of depletion, stress, parenting concerns, nervousness, worries, restlessness  Mental Status Exam:  Appearance:   Neat     Behavior:  Appropriate, Sharing, and Motivated  Motor:  Normal  Speech/Language:   Clear and Coherent and Normal Rate  Affect:  Appropriate and Congruent  Mood:  normal  Thought process:  normal  Thought content:    WNL  Sensory/Perceptual disturbances:    WNL  Orientation:  oriented to person, place, time/date, and situation  Attention:  Good  Concentration:  Good  Memory:  WNL  Fund of knowledge:   Good  Insight:    Good  Judgment:   Good  Impulse Control:  Good   Risk Assessment: Danger to Self:  No Self-injurious Behavior: No Danger to Others: No Duty to Warn:no Physical Aggression / Violence:No  Access to Firearms a concern: No  Gang Involvement:No   Subjective: Patient presented to session to address concerns of anxiety and depression.  She reported mixed progress.  She voiced experience of overwhelm and work, but to generally be feeling positive about her work sphere lately.  She processed experience of grief and loss as relates loss of a friend and her funeral recently, and counselor offered supportive therapy, holding space and being present with patient in her grief.  Patient processed discernment around enrolling one of her sons into a therapeutic/behavioral program, given challenges he is experiencing, and counselor assisted patient with discernment, offering psychoeducation where appropriate given her parenting concerns, and helping patient weigh pros and cons and get in touch with herself holistically around the decision.  Counselor also assisted patient in continuing to orient herself around self  advocacy regarding over functioning tendencies, particularly in home life sphere.  Interventions: Solution-Oriented/Positive Psychology, Humanistic/Existential, and Insight-Oriented, Psychoeducation  Diagnosis:   ICD-10-CM   1. Generalized anxiety disorder  F41.1     2. Major depressive disorder, recurrent episode, mild (HCC)  F33.0       Plan: Patient is scheduled for follow-up; continue process work and developing coping skills.  STG between sessions to continue to self advocate around over functioning tendencies, and to continue to discern holistically regarding decision for her son.  Progress note was dictated with Dragon and reviewed for accuracy.  [name], [facility]

## 2024-04-22 ENCOUNTER — Other Ambulatory Visit: Payer: Self-pay | Admitting: Gastroenterology

## 2024-04-24 ENCOUNTER — Ambulatory Visit (INDEPENDENT_AMBULATORY_CARE_PROVIDER_SITE_OTHER): Payer: Self-pay | Admitting: Behavioral Health

## 2024-04-24 DIAGNOSIS — Z0389 Encounter for observation for other suspected diseases and conditions ruled out: Secondary | ICD-10-CM

## 2024-04-24 NOTE — Progress Notes (Signed)
 Pt did not show for scheduled appt and did not provide 24 hour notice as required. Additional fees to be assessed.

## 2024-04-26 ENCOUNTER — Encounter: Payer: Self-pay | Admitting: Dietician

## 2024-04-26 ENCOUNTER — Encounter: Attending: Gastroenterology | Admitting: Dietician

## 2024-04-26 DIAGNOSIS — E139 Other specified diabetes mellitus without complications: Secondary | ICD-10-CM | POA: Insufficient documentation

## 2024-04-26 NOTE — Progress Notes (Signed)
 Diabetes Self-Management Education  Visit Type: Follow-up  Appt. Start Time: 1515 Appt. End Time: 1545  04/26/2024  [name], identified by name and date of birth, is a 48 y.o. female with a diagnosis of Diabetes:  .   ASSESSMENT  This is a virtual visit Patient Location: Home Provider Location: Office  History includes: diabetes 1.5, GERD Labs noted: 01/27/24 A1c 7.6 Medications include: lantus , humalog ,  Supplements: vitamin b12, MVI Allergies/intolerances: tomato, strawberry, eggplant, red apples, peaches. (All may cause hives) Omnipod & Dexcom G6   Pt reports her GMI is 7.2.   Pt reports she has been focused on increasing water and protein intake. Pt reports she recognized she was only drinking around 8-16 oz water daily and is now drinking a minimum of 48 oz.   Pt reports most days she has not been eating breakfast and goes straight to her desk to start working. Pt states she is often famished by lunch. Pt reports she typically does not have a snack between lunch and dinner and feels the same by dinner time.   Pt reports she notices when she eats cheese it is hard to bring her blood glucose down as quickly as she usually does.   Assessment and Continuation of Previous Goals Established by Patient:   Goal 1: go on a 10 minute walk around the neighborhood at least 3 days per week. - goal not met, continue.    Goal 2: plan what you're going to eat the next day the night before. - goal in progress, continue.   Last menstrual period 11/10/2018. There is no height or weight on file to calculate BMI.   Diabetes Self-Management Education - 04/26/24 1517       Visit Information   Visit Type Follow-up      Initial Visit   Are you currently following a meal plan? No    Are you taking your medications as prescribed? Yes      Health Coping   How would you rate your overall health? Good      Psychosocial Assessment   Patient Belief/Attitude about Diabetes  Motivated to manage diabetes    What is the hardest part about your diabetes right now, causing you the most concern, or is the most worrisome to you about your diabetes?   Making healty food and beverage choices    Self-care barriers None    Self-management support Doctor's office    Other persons present Patient    Patient Concerns Nutrition/Meal planning    Special Needs None    Preferred Learning Style No preference indicated    Learning Readiness Ready      Pre-Education Assessment   Patient understands the diabetes disease and treatment process. Needs Review    Patient understands incorporating nutritional management into lifestyle. Needs Review    Patient undertands incorporating physical activity into lifestyle. Needs Review    Patient understands using medications safely. Needs Review    Patient understands monitoring blood glucose, interpreting and using results Needs Review    Patient understands prevention, detection, and treatment of acute complications. Needs Review    Patient understands prevention, detection, and treatment of chronic complications. Needs Review    Patient understands how to develop strategies to address psychosocial issues. Needs Review    Patient understands how to develop strategies to promote health/change behavior. Needs Review      Complications   Last HgB A1C per patient/outside source 7.6 %    How often do you check  your blood sugar? > 4 times/day    Fasting Blood glucose range (mg/dL) 16-109    Postprandial Blood glucose range (mg/dL) 604-540      Dietary Intake   Breakfast none    Snack (morning) none    Lunch salad OR leftovers    Snack (afternoon) none    Dinner salad and slice of pizza    Snack (evening) milkshake    Beverage(s) 48 oz water, green tea      Activity / Exercise   Activity / Exercise Type ADL's      Patient Education   Previous Diabetes Education Yes (please comment)    Disease Pathophysiology Explored patient's  options for treatment of their diabetes    Healthy Eating Role of diet in the treatment of diabetes and the relationship between the three main macronutrients and blood glucose level;Plate Method;Reviewed blood glucose goals for pre and post meals and how to evaluate the patients' food intake on their blood glucose level.;Meal timing in regards to the patients' current diabetes medication.;Meal options for control of blood glucose level and chronic complications.    Being Active Identified with patient nutritional and/or medication changes necessary with exercise.    Medications Reviewed patients medication for diabetes, action, purpose, timing of dose and side effects.    Monitoring Identified appropriate SMBG and/or A1C goals.    Acute complications Discussed and identified patients' prevention, symptoms, and treatment of hyperglycemia.;Taught prevention, symptoms, and  treatment of hypoglycemia - the 15 rule.    Chronic complications Relationship between chronic complications and blood glucose control;Identified and discussed with patient  current chronic complications    Diabetes Stress and Support Identified and addressed patients feelings and concerns about diabetes    Lifestyle and Health Coping Lifestyle issues that need to be addressed for better diabetes care      Individualized Goals (developed by patient)   Nutrition General guidelines for healthy choices and portions discussed    Physical Activity Exercise 3-5 times per week;15 minutes per day    Medications take my medication as prescribed    Monitoring  Test my blood glucose as discussed    Problem Solving Eating Pattern    Reducing Risk examine blood glucose patterns;do foot checks daily;treat hypoglycemia with 15 grams of carbs if blood glucose less than 70mg /dL    Health Coping Ask for help with psychological, social, or emotional issues      Patient Self-Evaluation of Goals - Patient rates self as meeting previously set goals (%  of time)   Nutrition 50 - 75 % (half of the time)    Physical Activity 50 - 75 % (half of the time)    Medications >75% (most of the time)    Monitoring >75% (most of the time)    Problem Solving and behavior change strategies  50 - 75 % (half of the time)    Reducing Risk (treating acute and chronic complications) >75% (most of the time)    Health Coping >75% (most of the time)      Post-Education Assessment   Patient understands the diabetes disease and treatment process. Comprehends key points    Patient understands incorporating nutritional management into lifestyle. Comprehends key points    Patient undertands incorporating physical activity into lifestyle. Comprehends key points    Patient understands using medications safely. Comphrehends key points    Patient understands monitoring blood glucose, interpreting and using results Comprehends key points    Patient understands prevention, detection, and treatment of acute  complications. Comprehends key points    Patient understands prevention, detection, and treatment of chronic complications. Comprehends key points    Patient understands how to develop strategies to address psychosocial issues. Comprehends key points    Patient understands how to develop strategies to promote health/change behavior. Comprehends key points      Outcomes   Expected Outcomes Demonstrated interest in learning. Expect positive outcomes    Future DMSE 2 months    Program Status Not Completed      Subsequent Visit   Since your last visit have you continued or begun to take your medications as prescribed? Yes             Individualized Plan for Diabetes Self-Management Training:   Learning Objective:  Patient will have a greater understanding of diabetes self-management. Patient education plan is to attend individual and/or group sessions per assessed needs and concerns.   Plan:   Patient Instructions  Assessment and Continuation of Previous  Goals Established by Patient:   Goal 1: go on a 10 minute walk around the neighborhood at least 3 days per week. - goal not met, continue.    Goal 2: plan what you're going to eat the next day the night before. - goal in progress, continue.   Expected Outcomes:  Demonstrated interest in learning. Expect positive outcomes  Education material provided: none (virtual visit)  If problems or questions, patient to contact team via:  Phone  Future DSME appointment: 2 months

## 2024-04-26 NOTE — Patient Instructions (Signed)
 Assessment and Continuation of Previous Goals Established by Patient:   Goal 1: go on a 10 minute walk around the neighborhood at least 3 days per week. - goal not met, continue.    Goal 2: plan what you're going to eat the next day the night before. - goal in progress, continue.

## 2024-04-28 ENCOUNTER — Encounter: Payer: Self-pay | Admitting: "Endocrinology

## 2024-04-28 ENCOUNTER — Ambulatory Visit: Admitting: Professional Counselor

## 2024-04-28 ENCOUNTER — Ambulatory Visit: Admitting: "Endocrinology

## 2024-04-28 ENCOUNTER — Encounter: Payer: Self-pay | Admitting: Professional Counselor

## 2024-04-28 VITALS — BP 110/70 | HR 93 | Ht 65.0 in | Wt 172.0 lb

## 2024-04-28 DIAGNOSIS — E1065 Type 1 diabetes mellitus with hyperglycemia: Secondary | ICD-10-CM

## 2024-04-28 DIAGNOSIS — F330 Major depressive disorder, recurrent, mild: Secondary | ICD-10-CM | POA: Diagnosis not present

## 2024-04-28 DIAGNOSIS — F411 Generalized anxiety disorder: Secondary | ICD-10-CM | POA: Diagnosis not present

## 2024-04-28 DIAGNOSIS — E7800 Pure hypercholesterolemia, unspecified: Secondary | ICD-10-CM

## 2024-04-28 DIAGNOSIS — Z9641 Presence of insulin pump (external) (internal): Secondary | ICD-10-CM | POA: Diagnosis not present

## 2024-04-28 LAB — POCT GLYCOSYLATED HEMOGLOBIN (HGB A1C): Hemoglobin A1C: 7.3 % — AB (ref 4.0–5.6)

## 2024-04-28 NOTE — Patient Instructions (Signed)

## 2024-04-28 NOTE — Progress Notes (Signed)
 Outpatient Endocrinology Note Jorge Newcomer, MD  04/28/24   Mia Wilson Nov 20, 1976 [id number]  Referring Provider: Diamond Formica, PA Primary Care Provider: de [place], Raymond J, MD Reason for consultation: Subjective   Assessment & Plan  Diagnoses and all orders for this visit:  Uncontrolled type 1 diabetes mellitus with hyperglycemia (HCC) -     POCT glycosylated hemoglobin (Hb A1C) -     Comprehensive metabolic panel with GFR -     Lipid panel -     Microalbumin / creatinine urine ratio  Insulin  pump in place  Pure hypercholesterolemia   Diabetes Type I complicated by hyperglycemia C-pep 0.5, BG 212, Insulin  Ab + Lab Results  Component Value Date   GFR 92.07 10/13/2023   Hba1c goal less than 7, current Hba1c is  Lab Results  Component Value Date   HGBA1C 7.3 (A) 04/28/2024   Will recommend the following: OMNIPOD 5 insulin  pump BASAL RATES: Midnight = 0.3, 5 AM = 0.45, 9 AM = 0.3 and 7 PM = 0. 45 Bolus settings: I/C ratio 9 (5pm-12am: 9) ISF: 50, Target: 110, with correction over 130.  Insulin : Currently using Humalog   Stopped farxiga   No known contraindications/side effects to any of above medications Glucagon  discussed and prescribed with refills on 10/20/23  -Last LD and Tg are as follows: Lab Results  Component Value Date   LDLCALC 107 (H) 10/13/2023    Lab Results  Component Value Date   TRIG 44.0 10/13/2023   -On atorvastatin 10 mg QD -Follow low fat diet and exercise   -Blood pressure goal <140/90 - Microalbumin/creatinine goal is < 30 -Last MA/Cr is as follows: Lab Results  Component Value Date   MICROALBUR <0.7 10/13/2023   -not on ACE/ARB  -diet changes including salt restriction -limit eating outside -counseled BP targets per standards of diabetes care -uncontrolled blood pressure can lead to retinopathy, nephropathy and cardiovascular and atherosclerotic heart disease  Reviewed and counseled on: -A1C  target -Blood sugar targets -Complications of uncontrolled diabetes  -Checking blood sugar before meals and bedtime and bring log next visit -All medications with mechanism of action and side effects -Hypoglycemia management: rule of 15's, Glucagon  Emergency Kit and medical alert ID -low-carb low-fat plate-method diet -At least 20 minutes of physical activity per day -Annual dilated retinal eye exam and foot exam -compliance and follow up needs -follow up as scheduled or earlier if problem gets worse  Call if blood sugar is less than 70 or consistently above 250    Take a 15 gm snack of carbohydrate at bedtime before you go to sleep if your blood sugar is less than 100.    If you are going to fast after midnight for a test or procedure, ask your physician for instructions on how to reduce/decrease your insulin  dose.    Call if blood sugar is less than 70 or consistently above 250  -Treating a low sugar by rule of 15  (15 gms of sugar every 15 min until sugar is more than 70) If you feel your sugar is low, test your sugar to be sure If your sugar is low (less than 70), then take 15 grams of a fast acting Carbohydrate (3-4 glucose tablets or glucose gel or 4 ounces of juice or regular soda) Recheck your sugar 15 min after treating low to make sure it is more than 70 If sugar is still less than 70, treat again with 15 grams of carbohydrate  Don't drive the hour of hypoglycemia  If unconscious/unable to eat or drink by mouth, use glucagon  injection or nasal spray baqsimi and call 911. Can repeat again in 15 min if still unconscious.  Return in about 3 months (around 07/29/2024) for visit and 8 am labs before next visit.   I have reviewed current medications, nurse's notes, allergies, vital signs, past medical and surgical history, family medical history, and social history for this encounter. Counseled patient on symptoms, examination findings, lab findings, imaging results,  treatment decisions and monitoring and prognosis. The patient understood the recommendations and agrees with the treatment plan. All questions regarding treatment plan were fully answered.  Jorge Newcomer, MD  04/28/24   History of Present Illness Mia Wilson is a 48 y.o. year old female who presents for evaluation of Type II diabetes mellitus.  Mia Wilson was first diagnosed in 2009.   Diabetes education +  Diagnosis: date of diagnosis: 2009, initially asymptomatic  RECENT history:   Current pump settings:  OMNIPOD 5 insulin  pump BASAL RATES: Midnight = 0.3, 5 AM = 0.45, 9 AM = 0.3 and 7 PM = 0. 45 Bolus settings: I/C ratio 10 (5pm-12am: 10) ISF: 60, Target: 110, with correction over 130.  Insulin : Currently using Humalog   Non-insulin  hypoglycemic drugs: stopped Farxiga  10 mg daily  COMPLICATIONS -  MI/Stroke -  retinopathy -  neuropathy -  nephropathy  BLOOD SUGAR DATA  CGM interpretation: At today's visit, we reviewed her CGM downloads. The full report is scanned in the media. Reviewing the CGM trends, BG are mildly elevated after dinner>after lunch.    Physical Exam  BP 110/70   Pulse 93   Ht 5\' 5"  (1.651 m)   Wt 172 lb (78 kg)   LMP 11/10/2018 (Approximate)   SpO2 98%   BMI 28.62 kg/m    Constitutional: well developed, well nourished Head: normocephalic, atraumatic Eyes: sclera anicteric, no redness Neck: supple Lungs: normal respiratory effort Neurology: alert and oriented Skin: dry, no appreciable rashes Musculoskeletal: no appreciable defects Psychiatric: normal mood and affect Diabetic Foot Exam - Simple   No data filed      Current Medications Patient's Medications  New Prescriptions   No medications on file  Previous Medications   ATOMOXETINE  (STRATTERA ) 60 MG CAPSULE    TAKE 1 CAPSULE BY MOUTH EVERY DAY   ATORVASTATIN (LIPITOR) 10 MG TABLET    Take 10 mg by mouth daily.   BLOOD GLUCOSE MONITORING SUPPL (ONETOUCH VERIO FLEX  SYSTEM) W/DEVICE KIT    CHECK BLOOD SUGAR BEFORE AND AFTER EACH MEAL   CONTINUOUS GLUCOSE SENSOR (DEXCOM G6 SENSOR) MISC    USE AS INSTRUCTED CHANGE EVERY 10 DAYS   CONTINUOUS GLUCOSE TRANSMITTER (DEXCOM G6 TRANSMITTER) MISC    AS DIRECTED CHANGE EVERY 90 DAYS   DEXTROMETHORPHAN-BUPROPION ER (AUVELITY ) 45-105 MG TBCR    Take one tablet by mouth two times daily at least 8 hours between doses   DULOXETINE  (CYMBALTA ) 60 MG CAPSULE    Take 1 capsule (60 mg total) by mouth daily.   ESTRADIOL (CLIMARA - DOSED IN MG/24 HR) 0.0375 MG/24HR PATCH    Place 0.0375 mg onto the skin once a week.   GLUCAGON  (GVOKE HYPOPEN  1-PACK) 1 MG/0.2ML SOAJ    Inject 1 mg into the skin as needed (low blood sugar with impaired consciousness).   INSULIN  DISPOSABLE PUMP (OMNIPOD 5 G7 PODS, GEN 5,) MISC    Inject 1 Device into the skin every 3 (three) days. 1  DEVICE BY DOES NOT APPLY ROUTE EVERY 3 (THREE) DAYS.   INSULIN  GLARGINE (LANTUS  SOLOSTAR) 100 UNIT/ML SOLOSTAR PEN    Inject 14 Units into the skin daily. In case of pump failure   INSULIN  LISPRO (HUMALOG ) 100 UNIT/ML INJECTION    INJECT UPTO 50 UNITS INTO THE SKIN DAILY. USE MAX OF 50 UNITS DAILY VIA INSULIN  PUMP   LANCETS (ONETOUCH DELICA PLUS LANCET30G) MISC    1 each by Does not apply route See admin instructions. Use Onetouch Delica Plus lancets to check blood sugar before and after each meal.   MULTIPLE VITAMIN (MULTIVITAMIN WITH MINERALS) TABS    Take 1 tablet by mouth daily.   ONDANSETRON  (ZOFRAN ) 8 MG TABLET    Take 1 tablet (8 mg total) by mouth every 8 (eight) hours as needed for nausea or vomiting.   ONETOUCH VERIO TEST STRIP    USE ONETOUCH VERIO FLEX AS INSTRUCTED TO CHECK BLOOD SUGAR BEFORE AND AFTER EACH MEAL.   PANTOPRAZOLE  (PROTONIX ) 40 MG TABLET    TAKE 1 TABLET BY MOUTH EVERY DAY   PROGESTERONE (PROMETRIUM) 100 MG CAPSULE       TIRZEPATIDE (MOUNJARO) 2.5 MG/0.5ML PEN    Inject 2.5 mg into the skin once a week.   VITAMIN B-12 (CYANOCOBALAMIN ) 1000 MCG  TABLET    Take 1,000 mcg by mouth daily.  Modified Medications   No medications on file  Discontinued Medications   No medications on file    Allergies Allergies  Allergen Reactions   Mounjaro [Tirzepatide] Diarrhea and Nausea And Vomiting    Lost 15 pounds in 1 week.  Required IV fluids   Other Anaphylaxis and Hives    Peaches , tomatoes, strawberry   Bupropion     Other Reaction(s): doesn't tolerate doses over 300mg    Strawberry Extract Hives    Facial hives and mouth hives   Tomato Hives    Facial hives and mouth hives    Past Medical History 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)    Fibroids    PCOS (polycystic ovarian syndrome)     Past Surgical History Past Surgical History:  Procedure Laterality Date   BREAST EXCISIONAL BIOPSY Right    BREAST SURGERY     benign   IR ANGIOGRAM PELVIS SELECTIVE OR SUPRASELECTIVE  11/17/2018   IR ANGIOGRAM PELVIS SELECTIVE OR SUPRASELECTIVE  11/17/2018   IR ANGIOGRAM SELECTIVE EACH ADDITIONAL VESSEL  11/17/2018   IR ANGIOGRAM SELECTIVE EACH ADDITIONAL VESSEL  11/17/2018   IR EMBO TUMOR ORGAN ISCHEMIA INFARCT INC GUIDE ROADMAPPING  11/17/2018   IR RADIOLOGIST EVAL & MGMT  09/14/2018   IR RADIOLOGIST EVAL & MGMT  12/13/2018   IR RADIOLOGIST EVAL & MGMT  06/28/2019   IR US  GUIDE VASC ACCESS RIGHT  11/17/2018   UTERINE FIBROID SURGERY      Family History family history includes Breast cancer in her maternal grandmother; Cancer in an other family member; Diabetes in her maternal grandmother, maternal uncle, and paternal grandmother; Healthy in her father and mother; Heart disease in her maternal grandmother and paternal uncle; Hyperlipidemia in an other family member; Hypertension in an other family member; Stroke in an other family member.  Social History Social History   Socioeconomic History   Marital status: Married    Spouse name: Not on file   Number of children: 4   Years of  education: Not on file   Highest education level: Not  on file  Occupational History   Occupation: research admn.  Tobacco Use   Smoking status: Never    Passive exposure: Never   Smokeless tobacco: Never  Vaping Use   Vaping status: Never Used  Substance and Sexual Activity   Alcohol use: No   Drug use: No   Sexual activity: Yes  Other Topics Concern   Not on file  Social History Narrative   Living in [place] with husband and two adopted children 15 and 14. Currently on FMLA.  She is not employed at this time. She has been her 2 youngest adopted children at home. Not involved a lot else here at this time. She has 2 other children also. She walks a little bit but does not do a lot since she has left her with fatigue.   Social Drivers of Corporate investment banker Strain: Not on file  Food Insecurity: No Food Insecurity (03/22/2024)   Hunger Vital Sign    Worried About Running Out of Food in the Last Year: Never true    Ran Out of Food in the Last Year: Never true  Transportation Needs: Not on file  Physical Activity: Not on file  Stress: Stress Concern Present (05/19/2023)   [employer] of Occupational Health - Occupational Stress Questionnaire    Feeling of Stress : Rather much  Social Connections: Unknown (05/19/2023)   Social Connection and Isolation Panel [NHANES]    Frequency of Communication with Friends and Family: More than three times a week    Frequency of Social Gatherings with Friends and Family: More than three times a week    Attends Religious Services: More than 4 times per year    Active Member of [employer] or Organizations: Not on file    Attends Banker Meetings: Not on file    Marital Status: Married  Intimate Partner Violence: Unknown (02/26/2022)   Received from [employer], [facility]   HITS    Physically Hurt: Not on file    Insult or Talk Down To: Not on file    Threaten Physical Harm: Not on file    Scream or Curse: Not on file     Lab Results  Component Value Date   HGBA1C 7.3 (A) 04/28/2024   HGBA1C 7.6 (A) 01/27/2024   HGBA1C 8.6 (H) 10/13/2023   Lab Results  Component Value Date   CHOL 206 (H) 10/13/2023   Lab Results  Component Value Date   HDL 89.60 10/13/2023   Lab Results  Component Value Date   LDLCALC 107 (H) 10/13/2023   Lab Results  Component Value Date   TRIG 44.0 10/13/2023   Lab Results  Component Value Date   CHOLHDL 2 10/13/2023   Lab Results  Component Value Date   CREATININE 1.12 (H) 02/10/2024   Lab Results  Component Value Date   GFR 92.07 10/13/2023   Lab Results  Component Value Date   MICROALBUR <0.7 10/13/2023      Component Value Date/Time   NA 143 02/10/2024 1600   K 5.2 02/10/2024 1600   CL 102 02/10/2024 1600   CO2 CANCELED 02/10/2024 1600   GLUCOSE 162 (H) 02/10/2024 1600   GLUCOSE 212 (H) 11/18/2023 1049   BUN 18 02/10/2024 1600   CREATININE 1.12 (H) 02/10/2024 1600   CREATININE 0.71 11/18/2023 1049   CALCIUM 10.1 02/10/2024 1600   PROT 8.3 02/10/2024 1600   ALBUMIN 5.0 (H) 02/10/2024 1600   AST 34 02/10/2024 1600   ALT  22 02/10/2024 1600   ALKPHOS 143 (H) 02/10/2024 1600   BILITOT 1.6 (H) 02/10/2024 1600   GFRNONAA >60 11/15/2023 1609   GFRAA >60 11/17/2018 0758      Latest Ref Rng & Units 02/10/2024    4:00 PM 11/18/2023   10:49 AM 11/15/2023    4:09 PM  BMP  Glucose 70 - 99 mg/dL 098  119  147   BUN 6 - 24 mg/dL 18  11  11    Creatinine 0.57 - 1.00 mg/dL 8.29  5.62  1.30   BUN/Creat Ratio 9 - 23 16  SEE NOTE:    Sodium 134 - 144 mmol/L 143  140  134   Potassium 3.5 - 5.2 mmol/L 5.2  4.4  3.9   Chloride 96 - 106 mmol/L 102  103  103   CO2 mmol/L CANCELED  24  22   Calcium 8.7 - 10.2 mg/dL 86.5  9.3  8.8        Component Value Date/Time   WBC CANCELED 02/10/2024 1600   WBC 7.0 11/15/2023 1609   RBC CANCELED 02/10/2024 1600   RBC 4.48 11/15/2023 1609   HGB CANCELED 02/10/2024 1600   HCT CANCELED 02/10/2024 1600   PLT CANCELED  02/10/2024 1600   MCV 83.7 11/15/2023 1609   MCV 78.0 (A) 04/30/2012 1351   MCH 26.3 11/15/2023 1609   MCHC 31.5 11/15/2023 1609   RDW 13.5 11/15/2023 1609   LYMPHSABS CANCELED 02/10/2024 1600   MONOABS 0.6 11/15/2023 1609   EOSABS CANCELED 02/10/2024 1600   BASOSABS CANCELED 02/10/2024 1600     Parts of this note may have been dictated using voice recognition software. There may be variances in spelling and vocabulary which are unintentional. Not all errors are proofread. Please notify the [facility] if any discrepancies are noted or if the meaning of any statement is not clear.

## 2024-04-28 NOTE — Progress Notes (Signed)
      [facility] Counselor/Therapist Progress Note  Patient ID: CHARINA FONS, MRN: [id number],    Date: 04/28/2024  Time Spent: 1:05 PM - 2:01 PM   Treatment Type: Individual Therapy  Reported Symptoms: intermittent low mood, worries, restlessness, stress, parenting concerns   Mental Status Exam:  Appearance:   Casual     Behavior:  Appropriate, Sharing, and Motivated  Motor:  Normal  Speech/Language:   Clear and Coherent and Normal Rate  Affect:  Appropriate and Congruent  Mood:  normal  Thought process:  normal  Thought content:    WNL  Sensory/Perceptual disturbances:    WNL  Orientation:  oriented to person, place, time/date, and situation  Attention:  Good  Concentration:  Good  Memory:  WNL  Fund of knowledge:   Good  Insight:    Good  Judgment:   Good  Impulse Control:  Good   Risk Assessment: Danger to Self:  No Self-injurious Behavior: No Danger to Others: No Duty to Warn:no Physical Aggression / Violence:No  Access to Firearms a concern: No  Gang Involvement:No   Subjective: Patient presented to session to address concerns of anxiety and depression.  Patient reported mixed progress.  She reported exacerbated stress at work, and regarding a sensitive extended family member circumstance.  She reported progress at home and having her husband more on board to help with parenting concerns, and for them to have shifted layout in the house for optimal parenting concerns functioning.  She reported to be seeing a nutritionist and for this to be helpful.  She processed the experience of developments with her son for him she is particularly concerned at this time, and counselor assisted patient with discernment process regarding her options.  Counselor actively listened, affirmed patient feelings and experience, and reinforced patient utilization of nutritionist and motivation toward making changes such as house rearranging to help alleviate worries and best support  herself and family system as a whole.  Interventions: Solution-Oriented/Positive Psychology, Humanistic/Existential, and Insight-Oriented  Diagnosis:   ICD-10-CM   1. Generalized anxiety disorder  F41.1     2. Major depressive disorder, recurrent episode, mild (HCC)  F33.0       Plan: Pt is scheduled for a follow-up in two weeks. Continue process work and Conservation officer, historic buildings. Pt STG between sessions to consider resources discussed regarding wellness and education plan for son, and continue to resource support from husband and other family to limit overfunctioning tendencies.  [name], [facility]

## 2024-05-01 ENCOUNTER — Encounter: Payer: Self-pay | Admitting: "Endocrinology

## 2024-05-10 ENCOUNTER — Telehealth: Payer: Self-pay | Admitting: "Endocrinology

## 2024-05-10 ENCOUNTER — Other Ambulatory Visit: Payer: Self-pay

## 2024-05-10 DIAGNOSIS — E1065 Type 1 diabetes mellitus with hyperglycemia: Secondary | ICD-10-CM

## 2024-05-10 MED ORDER — DEXCOM G7 SENSOR MISC
3 refills | Status: AC
Start: 2024-05-10 — End: ?

## 2024-05-10 NOTE — Telephone Encounter (Signed)
 Patient is calling to say that she is currently on Dexcom G6 products and would like to know shat she can do to be sweet to the Dexcom G7 products.  Patient uses   [id number] - [place], [place] - [address] (Ph: [phone])

## 2024-05-10 NOTE — Telephone Encounter (Signed)
 New Rx for [facility] G7 sent in.

## 2024-05-11 ENCOUNTER — Ambulatory Visit: Admitting: Gastroenterology

## 2024-05-11 ENCOUNTER — Encounter: Payer: Self-pay | Admitting: Gastroenterology

## 2024-05-11 VITALS — BP 110/80 | HR 80 | Ht 65.5 in | Wt 170.0 lb

## 2024-05-11 DIAGNOSIS — K5909 Other constipation: Secondary | ICD-10-CM

## 2024-05-11 DIAGNOSIS — R1030 Lower abdominal pain, unspecified: Secondary | ICD-10-CM

## 2024-05-11 DIAGNOSIS — R110 Nausea: Secondary | ICD-10-CM | POA: Diagnosis not present

## 2024-05-11 DIAGNOSIS — R1013 Epigastric pain: Secondary | ICD-10-CM

## 2024-05-11 DIAGNOSIS — Z83719 Family history of colon polyps, unspecified: Secondary | ICD-10-CM

## 2024-05-11 DIAGNOSIS — R142 Eructation: Secondary | ICD-10-CM

## 2024-05-11 MED ORDER — NA SULFATE-K SULFATE-MG SULF 17.5-3.13-1.6 GM/177ML PO SOLN: 1.0000 | Freq: Once | ORAL | 0 refills | Status: AC

## 2024-05-11 MED ORDER — FAMOTIDINE 20 MG PO TABS: 20.0000 mg | ORAL_TABLET | Freq: Every day | ORAL | 2 refills | Status: DC

## 2024-05-11 NOTE — Progress Notes (Signed)
 Chief Complaint: follow up Primary GI MD: Dr. Venice Gillis  HPI: Discussed the use of AI scribe software for clinical note transcription with the patient, who gave verbal consent to proceed.  History of Present Illness Mia Wilson is a 48 year old female who presents with follow up nausea and constipation.  She experiences persistent nausea, particularly in the mornings, which improves after taking pantoprazole . She takes pantoprazole  once daily and notes that missing a dose results in increased nausea later in the day. The nausea is described as a queasy feeling without acid reflux. She has not taken her morning dose at the time of the visit.  She reports ongoing constipation, describing it as a constant issue. A recent episode where a milkshake provided temporary relief was noted, but constipation returned shortly after. She has been trying to increase her water intake and has used Benefiber without success. She previously used Miralax  but stopped when she ran out of the orange-flavored version she preferred.  She is currently pregnant and has a daughter at home. Her husband is in the [employer] and she provides emotional support to him as he grieves the recent loss of his mother.   PREVIOUS GI WORKUP   Colonoscopy 5 years ago with [facility] for FH of colon polyps (repeat due 2025). No previous polyps on last colonoscopy.  CT abdomen pelvis with contrast 11/11/2023 - Moderate colonic stool burden - Hepatic steatosis - Uterine fibroids - Normal gallbladder  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)    Fibroids    PCOS (polycystic ovarian syndrome)     Past Surgical History:  Procedure Laterality Date   BREAST EXCISIONAL BIOPSY Right    BREAST SURGERY     benign   IR ANGIOGRAM PELVIS SELECTIVE OR SUPRASELECTIVE  11/17/2018   IR ANGIOGRAM PELVIS SELECTIVE OR SUPRASELECTIVE  11/17/2018   IR ANGIOGRAM SELECTIVE EACH ADDITIONAL VESSEL   11/17/2018   IR ANGIOGRAM SELECTIVE EACH ADDITIONAL VESSEL  11/17/2018   IR EMBO TUMOR ORGAN ISCHEMIA INFARCT INC GUIDE ROADMAPPING  11/17/2018   IR RADIOLOGIST EVAL & MGMT  09/14/2018   IR RADIOLOGIST EVAL & MGMT  12/13/2018   IR RADIOLOGIST EVAL & MGMT  06/28/2019   IR US  GUIDE VASC ACCESS RIGHT  11/17/2018   UTERINE FIBROID SURGERY      Current Outpatient Medications  Medication Sig Dispense Refill   atomoxetine  (STRATTERA ) 60 MG capsule TAKE 1 CAPSULE BY MOUTH EVERY DAY 90 capsule 0   atorvastatin (LIPITOR) 10 MG tablet Take 10 mg by mouth daily.     Continuous Glucose Sensor (DEXCOM G7 SENSOR) MISC Use to check glucose continuously, change sensor every 10 days 9 each 3   Continuous Glucose Transmitter (DEXCOM G6 TRANSMITTER) MISC AS DIRECTED CHANGE EVERY 90 DAYS 1 each 3   Dextromethorphan-buPROPion ER (AUVELITY ) 45-105 MG TBCR Take one tablet by mouth two times daily at least 8 hours between doses 60 tablet 3   DULoxetine  (CYMBALTA ) 60 MG capsule Take 1 capsule (60 mg total) by mouth daily. 90 capsule 1   estradiol (VIVELLE-DOT) 0.05 MG/24HR patch Place 1 patch onto the skin 2 (two) times a week.     famotidine (PEPCID) 20 MG tablet Take 1 tablet (20 mg total) by mouth at bedtime. 30 tablet 2   Glucagon  (GVOKE HYPOPEN  1-PACK) 1 MG/0.2ML SOAJ Inject 1 mg into the skin as needed (low blood sugar with impaired consciousness). 0.4 mL 2  Insulin  Disposable Pump (OMNIPOD 5 G7 PODS, GEN 5,) MISC Inject 1 Device into the skin every 3 (three) days. 1 DEVICE BY DOES NOT APPLY ROUTE EVERY 3 (THREE) DAYS. 30 each 2   insulin  glargine (LANTUS  SOLOSTAR) 100 UNIT/ML Solostar Pen Inject 14 Units into the skin daily. In case of pump failure 6 mL 1   insulin  lispro (HUMALOG ) 100 UNIT/ML injection INJECT UPTO 50 UNITS INTO THE SKIN DAILY. USE MAX OF 50 UNITS DAILY VIA INSULIN  PUMP 75 mL 3   Multiple Vitamin (MULTIVITAMIN WITH MINERALS) TABS Take 1 tablet by mouth daily.     Na Sulfate-K Sulfate-Mg Sulfate  concentrate (SUPREP) 17.5-3.13-1.6 GM/177ML SOLN Take 1 kit (354 mLs total) by mouth once for 1 dose. 354 mL 0   pantoprazole  (PROTONIX ) 40 MG tablet TAKE 1 TABLET BY MOUTH EVERY DAY 90 tablet 0   progesterone (PROMETRIUM) 100 MG capsule      vitamin B-12 (CYANOCOBALAMIN ) 1000 MCG tablet Take 1,000 mcg by mouth daily.     No current facility-administered medications for this visit.    Allergies as of 05/11/2024 - Review Complete 05/11/2024  Allergen Reaction Noted   Mounjaro  [tirzepatide ] Diarrhea and Nausea And Vomiting 02/10/2024   Other Anaphylaxis and Hives 04/08/2016   Bupropion  02/08/2024   Strawberry extract Hives 11/08/2013   Tomato Hives 11/08/2013    Family History  Problem Relation Age of Onset   Healthy Mother    Healthy Father    Diabetes Maternal Grandmother    Heart disease Maternal Grandmother    Breast cancer Maternal Grandmother        diagnosed in her 78's   Diabetes Paternal Grandmother    Diabetes Maternal Uncle    Heart disease Paternal Uncle    Hyperlipidemia Other    Hypertension Other    Stroke Other    Cancer Other     Social History   Socioeconomic History   Marital status: Married    Spouse name: Not on file   Number of children: 4   Years of education: Not on file   Highest education level: Not on file  Occupational History   Occupation: research admn.  Tobacco Use   Smoking status: Never    Passive exposure: Never   Smokeless tobacco: Never  Vaping Use   Vaping status: Never Used  Substance and Sexual Activity   Alcohol use: No   Drug use: No   Sexual activity: Yes  Other Topics Concern   Not on file  Social History Narrative   Living in [place] with husband and two adopted children 15 and 14. Currently on FMLA.  She is not employed at this time. She has been her 2 youngest adopted children at home. Not involved a lot else here at this time. She has 2 other children also. She walks a little bit but does not do a lot since she  has left her with fatigue.   Social Drivers of Corporate investment banker Strain: Not on file  Food Insecurity: No Food Insecurity (03/22/2024)   Hunger Vital Sign    Worried About Running Out of Food in the Last Year: Never true    Ran Out of Food in the Last Year: Never true  Transportation Needs: Not on file  Physical Activity: Not on file  Stress: Stress Concern Present (05/19/2023)   [employer] of Occupational Health - Occupational Stress Questionnaire    Feeling of Stress : Rather much  Social Connections: Unknown (05/19/2023)  Social Connection and Isolation Panel    Frequency of Communication with Friends and Family: More than three times a week    Frequency of Social Gatherings with Friends and Family: More than three times a week    Attends Religious Services: More than 4 times per year    Active Member of [employer] or Organizations: Not on file    Attends Banker Meetings: Not on file    Marital Status: Married  Intimate Partner Violence: Unknown (02/26/2022)   Received from [facility]   HITS    Physically Hurt: Not on file    Insult or Talk Down To: Not on file    Threaten Physical Harm: Not on file    Scream or Curse: Not on file    Review of Systems:    Constitutional: No weight loss, fever, chills, weakness or fatigue HEENT: Eyes: No change in vision               Ears, Nose, Throat:  No change in hearing or congestion Skin: No rash or itching Cardiovascular: No chest pain, chest pressure or palpitations   Respiratory: No SOB or cough Gastrointestinal: See HPI and otherwise negative Genitourinary: No dysuria or change in urinary frequency Neurological: No headache, dizziness or syncope Musculoskeletal: No new muscle or joint pain Hematologic: No bleeding or bruising Psychiatric: No history of depression or anxiety    Physical Exam:  Vital signs: BP 110/80 (BP Location: Left Arm, Patient Position: Sitting, Cuff Size: Normal)   Pulse 80    Ht 5' 5.5 (1.664 m)   Wt 170 lb (77.1 kg)   LMP 11/10/2018 (Approximate)   BMI 27.86 kg/m   Constitutional: NAD, alert and cooperative Head:  Normocephalic and atraumatic. Eyes:   PEERL, EOMI. No icterus. Conjunctiva pink. Respiratory: Respirations even and unlabored. Lungs clear to auscultation bilaterally.   No wheezes, crackles, or rhonchi.  Cardiovascular:  Regular rate and rhythm. No peripheral edema, cyanosis or pallor.  Gastrointestinal:  Soft, nondistended, nontender. No rebound or guarding. Normal bowel sounds. No appreciable masses or hepatomegaly. Rectal:  Declines Msk:  Symmetrical without gross deformities. Without edema, no deformity or joint abnormality.  Neurologic:  Alert and  oriented x4;  grossly normal neurologically.  Skin:   Dry and intact without significant lesions or rashes. Psychiatric: Oriented to person, place and time. Demonstrates good judgement and reason without abnormal affect or behaviors.  RELEVANT LABS AND IMAGING: CBC    Component Value Date/Time   WBC CANCELED 02/10/2024 1600   WBC 7.0 11/15/2023 1609   RBC CANCELED 02/10/2024 1600   RBC 4.48 11/15/2023 1609   HGB CANCELED 02/10/2024 1600   HCT CANCELED 02/10/2024 1600   PLT CANCELED 02/10/2024 1600   MCV 83.7 11/15/2023 1609   MCV 78.0 (A) 04/30/2012 1351   MCH 26.3 11/15/2023 1609   MCHC 31.5 11/15/2023 1609   RDW 13.5 11/15/2023 1609   LYMPHSABS CANCELED 02/10/2024 1600   MONOABS 0.6 11/15/2023 1609   EOSABS CANCELED 02/10/2024 1600   BASOSABS CANCELED 02/10/2024 1600    CMP     Component Value Date/Time   NA 143 02/10/2024 1600   K 5.2 02/10/2024 1600   CL 102 02/10/2024 1600   CO2 CANCELED 02/10/2024 1600   GLUCOSE 162 (H) 02/10/2024 1600   GLUCOSE 212 (H) 11/18/2023 1049   BUN 18 02/10/2024 1600   CREATININE 1.12 (H) 02/10/2024 1600   CREATININE 0.71 11/18/2023 1049   CALCIUM 10.1 02/10/2024 1600   PROT 8.3  02/10/2024 1600   ALBUMIN 5.0 (H) 02/10/2024 1600   AST 34  02/10/2024 1600   ALT 22 02/10/2024 1600   ALKPHOS 143 (H) 02/10/2024 1600   BILITOT 1.6 (H) 02/10/2024 1600   GFRNONAA >60 11/15/2023 1609   GFRAA >60 11/17/2018 0758     Assessment/Plan:   Chronic constipation Lower abdominal cramping Improved on MiraLAX  as long she remains compliant which has been difficult due. Reportedly had colonoscopy 5 years ago with [facility], we are waiting on those records.   - Continue MiraLAX .  Can increase to 2 capfuls if needed. - Add squatty potty - Continue increase fiber increase water and increase exercise   Epigastric discomfort Nausea Eructation Dyspeptic symptoms mainly in the morning, improvement throughout the day on PPI 40mg  every day. She is under a lot of stress.  Denies NSAIDs.  Offered EGD and patient politely declined and prefers conservative management. Reports eating late at night - continue PPI 40mg  daily - Educated patient on lifestyle modifications and provided GERD education handouts - add on famotidine at bedtime and recommend elevating head of the bed. Leave 3 hours before eating and lying flat - Follow-up with me in October.   Family history of colon polyps Due for colonoscopy 2025 -- schedule colon -- I thoroughly discussed the procedure with the patient (at bedside) to include nature of the procedure, alternatives, benefits, and risks (including but not limited to bleeding, infection, perforation, anesthesia/cardiac pulmonary complications).  Patient verbalized understanding and gave verbal consent to proceed with procedure.   Diabetes   Toniqua Melamed Lorina [facility] 05/11/2024, 12:26 PM  Cc: de [place], Raymond J, MD

## 2024-05-11 NOTE — Patient Instructions (Addendum)
 We have sent the following medications to your pharmacy for you to pick up at your convenience: Suprep, Pepcid  You have been scheduled for a colonoscopy. Please follow written instructions given to you at your visit today.   If you use inhalers (even only as needed), please bring them with you on the day of your procedure.  DO NOT TAKE 7 DAYS PRIOR TO TEST- Trulicity (dulaglutide) Ozempic, Wegovy (semaglutide) Mounjaro  (tirzepatide ) Bydureon Bcise (exanatide extended release)  DO NOT TAKE 1 DAY PRIOR TO YOUR TEST Rybelsus (semaglutide) Adlyxin (lixisenatide) Victoza (liraglutide) Byetta (exanatide) ___________________________________________________________________________ ____________________________________________________ Virgel Griffes _   INSULIN  PUMP MEDICATION INSTRUCTIONS We will contact the physician managing your diabetic care for written dosage instructions for the day before your procedure and the day of your procedure.  Once we have received the instructions, we will contact you.   If your blood pressure at your visit was 140/90 or greater, please contact your primary care physician to follow up on this.  _______________________________________________________  If you are age 63 or older, your body mass index should be between 23-30. Your Body mass index is 27.86 kg/m. If this is out of the aforementioned range listed, please consider follow up with your Primary Care Provider.  If you are age 66 or younger, your body mass index should be between 19-25. Your Body mass index is 27.86 kg/m. If this is out of the aformentioned range listed, please consider follow up with your Primary Care Provider.   ________________________________________________________  The [facility] providers would like to encourage you to use MYCHART to communicate with providers for non-urgent requests or questions.  Due to long hold times on the telephone, sending your provider a message by [facility] may be a  faster and more efficient way to get a response.  Please allow 48 business hours for a response.  Please remember that this is for non-urgent requests.  _______________________________________________________    Due to recent changes in healthcare laws, you may see the results of your imaging and laboratory studies on MyChart before your provider has had a chance to review them.  We understand that in some cases there may be results that are confusing or concerning to you. Not all laboratory results come back in the same time frame and the provider may be waiting for multiple results in order to interpret others.  Please give us  48 hours in order for your provider to thoroughly review all the results before contacting the office for clarification of your results.

## 2024-05-12 ENCOUNTER — Ambulatory Visit: Admitting: Professional Counselor

## 2024-05-12 ENCOUNTER — Encounter: Payer: Self-pay | Admitting: Professional Counselor

## 2024-05-12 ENCOUNTER — Telehealth: Payer: Self-pay

## 2024-05-12 DIAGNOSIS — F411 Generalized anxiety disorder: Secondary | ICD-10-CM | POA: Diagnosis not present

## 2024-05-12 DIAGNOSIS — F330 Major depressive disorder, recurrent, mild: Secondary | ICD-10-CM
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
## 2024-05-12 NOTE — Progress Notes (Signed)
 Agree with assessment/plan.  Edman Circle, MD [facility] [phone]

## 2024-05-12 NOTE — Telephone Encounter (Signed)
 Mia Wilson Apr 12, 1976 [id number]   Dear Dr. Jorge Newcomer    Dr.  has scheduled the above individual for a(n) colonoscopy at 1:00pm on 06/23/2024.  Our records show that this patient is on insulin  therapy via an insulin  pump.  Our colonoscopy prep protocol requires that:   the patient must be on a clear liquid diet the entire day prior to the procedure date as well as the morning of the procedure  the patient must be NPO for 3  hours prior to the procedure   the patient must consume a suprep solution to prepare for the procedure.  Please advise us  of any adjustments that need to be made to the patient's insulin  pump therapy prior to the above procedure date.    Please route or fax your response to [phone] .  If you have any questions, please call me at [phone].  Thank you for your help with this matter.  Sincerely,  Krisy Dix [place], [facility]    Physician Recommendation:  _____________________________________________________________________  ______________________________________________________________________  ______________________________________________________________________  ______________________________________________________________________

## 2024-05-16 NOTE — Telephone Encounter (Signed)
 Lvm for pt to call back.

## 2024-05-18 NOTE — Telephone Encounter (Signed)
 I spoke with Mia Wilson and she said she can see the instructions in her [facility] and she is going to call them and set up an appointment to review the instructions a week prior.

## 2024-05-30 ENCOUNTER — Other Ambulatory Visit: Payer: Self-pay | Admitting: Behavioral Health

## 2024-05-30 DIAGNOSIS — F902 Attention-deficit hyperactivity disorder, combined type: Secondary | ICD-10-CM

## 2024-06-02 ENCOUNTER — Ambulatory Visit: Admitting: Professional Counselor

## 2024-06-07 ENCOUNTER — Ambulatory Visit
Admission: RE | Admit: 2024-06-07 | Discharge: 2024-06-07 | Disposition: A | Discharge status: Home / Self Care | Attending: Family Medicine | Admitting: Family Medicine

## 2024-06-07 ENCOUNTER — Ambulatory Visit

## 2024-06-07 VITALS — BP 120/85 | HR 83 | Temp 98.3°F | Resp 19

## 2024-06-07 DIAGNOSIS — S92531A Displaced fracture of distal phalanx of right lesser toe(s), initial encounter for closed fracture: Secondary | ICD-10-CM | POA: Diagnosis not present

## 2024-06-07 DIAGNOSIS — M7989 Other specified soft tissue disorders: Secondary | ICD-10-CM | POA: Diagnosis not present

## 2024-06-07 DIAGNOSIS — S62636A Displaced fracture of distal phalanx of right little finger, initial encounter for closed fracture: Secondary | ICD-10-CM | POA: Diagnosis not present

## 2024-06-07 DIAGNOSIS — S99921A Unspecified injury of right foot, initial encounter: Secondary | ICD-10-CM | POA: Diagnosis not present

## 2024-06-07 DIAGNOSIS — M79671 Pain in right foot: Secondary | ICD-10-CM | POA: Diagnosis not present

## 2024-06-07 DIAGNOSIS — M79674 Pain in right toe(s): Secondary | ICD-10-CM

## 2024-06-07 MED ORDER — CELECOXIB 200 MG PO CAPS: 200.0000 mg | ORAL_CAPSULE | Freq: Every day | ORAL | 0 refills | Status: AC

## 2024-06-07 NOTE — Discharge Instructions (Addendum)
 Advised patient to wear postop shoe 24/7 until being evaluated by [facility] orthopedics.  Contact information provided with his AVS today.  Advised patient to take medication as directed with food to completion.  Encouraged to increase daily water intake to 64 ounces per day while taking this medication.  Advised if symptoms worsen and/or unresolved please follow-up with your PCP or here for further evaluation.

## 2024-06-07 NOTE — ED Triage Notes (Signed)
 [facility] to [facility] with [facility] fall down [facility] stairs on Monday at 6 pm. Pt reports she was carrying her laptop down the stairs and her dog tripped her up. She fell down the last three steps. Twisting knee and injuring her second toe on the right foot. Pt reports it has  started to change colors and since she is a type one diabetic she wanted to have it checked. Pt reports taking motrin  for pain on Monday nothing since.

## 2024-06-07 NOTE — ED Provider Notes (Signed)
 [facility]    CSN: [id number] Arrival date & time: 06/07/24  9161      History   Chief Complaint Chief Complaint  Patient presents with   Toe Injury    HPI Mia Wilson is Wilson 48 y.o. female.   HPI Pleasant 48 year old female presents with right foot second toe injury sustained on Monday, 06/05/2024.  Patient reports was walking down steps when her dog accidentally tripped her causing her to fall.  Patient believes she may have fractured her right second toe.  PMH significant for PCOS, HLD and T1DM.  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/2023   Uterine fibroid 11/17/2018   Fibroids 11/17/2018   Pure hypercholesterolemia 04/02/2015   Diabetes (HCC) 11/20/2013   Anemia 11/14/2013   Perimenopausal menorrhagia 11/14/2013   Type II diabetes mellitus, uncontrolled 06/20/2013   PCOS (polycystic ovarian syndrome)     Past Surgical History:  Procedure Laterality Date   BREAST EXCISIONAL BIOPSY Right    BREAST SURGERY     benign   IR ANGIOGRAM PELVIS SELECTIVE OR SUPRASELECTIVE  11/17/2018   IR ANGIOGRAM PELVIS SELECTIVE OR SUPRASELECTIVE  11/17/2018   IR ANGIOGRAM SELECTIVE EACH ADDITIONAL VESSEL  11/17/2018   IR ANGIOGRAM SELECTIVE EACH ADDITIONAL VESSEL  11/17/2018   IR EMBO TUMOR ORGAN ISCHEMIA INFARCT INC GUIDE ROADMAPPING  11/17/2018   IR RADIOLOGIST EVAL & MGMT  09/14/2018   IR RADIOLOGIST EVAL & MGMT  12/13/2018   IR RADIOLOGIST EVAL & MGMT  06/28/2019   IR US  GUIDE VASC ACCESS RIGHT  11/17/2018   UTERINE FIBROID SURGERY      OB History   No obstetric history on file.      Home Medications    Prior to Admission medications   Medication Sig Start Date End Date  Taking? Authorizing Provider  atomoxetine  (STRATTERA ) 60 MG capsule TAKE 1 CAPSULE BY MOUTH EVERY DAY 05/30/24   Mia Wilson Wilson, Wilson  celecoxib  (CELEBREX ) 200 MG capsule Take 1 capsule (200 mg total) by mouth daily for 15 days. 06/07/24 06/22/24 Yes Mia Sharper, FNP  atorvastatin (LIPITOR) 10 MG tablet Take 10 mg by mouth daily.    Provider, Historical, Wilson  Continuous Glucose Sensor (DEXCOM G7 SENSOR) MISC Use to check glucose continuously, change sensor every 10 days 05/10/24   Mia Wilson  Continuous Glucose Transmitter (DEXCOM G6 TRANSMITTER) MISC AS DIRECTED CHANGE EVERY 90 DAYS 11/08/23   Mia Wilson  Dextromethorphan-buPROPion ER (AUVELITY ) 45-105 MG TBCR Take one tablet by mouth two times daily at least 8 hours between doses 01/03/24   Mia Wilson  DULoxetine  (CYMBALTA ) 60 MG capsule Take 1 capsule (60 mg total) by mouth daily. 01/03/24   Mia Wilson LABOR, Wilson  estradiol (VIVELLE-DOT) 0.05 MG/24HR patch Place 1 patch onto the skin 2 (two) times Wilson week. 03/28/24   Provider, Historical, Wilson  famotidine  (PEPCID ) 20 MG tablet Take 1 tablet (20 mg total) by mouth at bedtime. 05/11/24   McMichael, Mia Wilson  Glucagon  (GVOKE HYPOPEN  1-PACK) 1 MG/0.2ML SOAJ Inject 1 mg into the skin as needed (low blood sugar with impaired consciousness). 10/20/23   Motwani, Mia Wilson  Insulin  Disposable Pump (OMNIPOD 5 G7 PODS, GEN 5,) MISC Inject 1 Device into the skin every 3 (three) days. 1 DEVICE BY DOES NOT APPLY ROUTE EVERY 3 (THREE) DAYS. 11/11/23   Motwani, Mia Wilson  insulin  glargine (LANTUS  SOLOSTAR) 100 UNIT/ML Solostar Pen Inject 14 Units into the skin daily. In case of pump failure 11/18/23 05/11/24  Motwani, Mia Wilson  insulin  lispro (HUMALOG ) 100 UNIT/ML injection INJECT UPTO 50 UNITS INTO THE SKIN DAILY. USE MAX OF 50 UNITS DAILY VIA INSULIN  PUMP 12/10/23   Motwani, Mia Wilson  Multiple Vitamin (MULTIVITAMIN WITH MINERALS) TABS Take 1 tablet by mouth daily.    Provider, Historical, Wilson   pantoprazole  (PROTONIX ) 40 MG tablet TAKE 1 TABLET BY MOUTH EVERY DAY 04/24/24   McMichael, Mia Wilson  progesterone (PROMETRIUM) 100 MG capsule  03/16/23   Provider, Historical, Wilson  vitamin B-12 (CYANOCOBALAMIN ) 1000 MCG tablet Take 1,000 mcg by mouth daily.    Provider, Historical, Wilson    Family History Family History  Problem Relation Age of Onset   Healthy Mother    Healthy Father    Diabetes Maternal Grandmother    Heart disease Maternal Grandmother    Breast cancer Maternal Grandmother        diagnosed in her 94's   Diabetes Paternal Grandmother    Diabetes Maternal Uncle    Heart disease Paternal Uncle    Hyperlipidemia Other    Hypertension Other    Stroke Other    Cancer Other     Social History Social History   Tobacco Use   Smoking status: Never    Passive exposure: Never   Smokeless tobacco: Never  Vaping Use   Vaping status: Never Used  Substance Use Topics   Alcohol use: No   Drug use: No     Allergies   Mounjaro  [tirzepatide ], Other, Bupropion, Strawberry extract, and Tomato   Review of Systems Review of Systems  Musculoskeletal:        Right foot second toe injury secondary to falling down stairs at her home on Monday, 06/05/2024 reports dog tripped her     Physical Exam Triage Vital Signs ED Triage Vitals  Encounter Vitals Group     BP      Girls Systolic BP Percentile      Girls Diastolic BP Percentile      Boys Systolic BP Percentile      Boys Diastolic BP Percentile      Pulse      Resp      Temp      Temp src      SpO2      Weight      Height      Head Circumference      Peak Flow      Pain Score      Pain Loc      Pain Education      Exclude from Growth Chart    No data found.  Updated Vital Signs BP 120/85   Pulse 83   Temp 98.3 F (36.8 C)   Resp 19   LMP 11/10/2018 (Approximate)   SpO2 98%    Physical Exam Vitals and nursing note reviewed.  Constitutional:      Appearance: Normal appearance. She is normal  weight.  HENT:     Head: Normocephalic and atraumatic.     Mouth/Throat:     Mouth: Mucous membranes are moist.     Pharynx: Oropharynx is clear.  Eyes:  Extraocular Movements: Extraocular movements intact.     Conjunctiva/sclera: Conjunctivae normal.     Pupils: Pupils are equal, round, and reactive to light.  Cardiovascular:     Rate and Rhythm: Normal rate and regular rhythm.     Pulses: Normal pulses.     Heart sounds: Normal heart sounds.  Pulmonary:     Effort: Pulmonary effort is normal.     Breath sounds: Normal breath sounds. No wheezing, rhonchi or rales.  Musculoskeletal:        General: Normal range of motion.     Comments: Second toe (dorsum): TTP over distal phalanx, mild soft tissue swelling noted  Skin:    General: Skin is warm and dry.  Neurological:     General: No focal deficit present.     Mental Status: She is alert and oriented to person, place, and time. Mental status is at baseline.  Psychiatric:        Mood and Affect: Mood normal.        Behavior: Behavior normal.      UC Treatments / Results  Labs (all labs ordered are listed, but only abnormal results are displayed) Labs Reviewed - No data to display  EKG   Radiology DG Foot Complete Right Result Date: 06/07/2024 CLINICAL DATA:  injury, right foot pain after falling downstairs EXAM: RIGHT FOOT COMPLETE - 3+ VIEW COMPARISON:  None Available. FINDINGS: Minimally distracted, avulsion fracture through the dorsal aspect of the second distal phalanx. There is Wilson proximally 1.2 cm of bony distraction. There is no evidence of arthropathy or other focal bone abnormality. Soft tissues are unremarkable. No radiopaque foreign body. IMPRESSION: Minimally distracted, avulsion fracture through the dorsal base of the second distal phalanx. Electronically Signed   By: Rogelia Myers Wilson.D.   On: 06/07/2024 09:42    Procedures Procedures (including critical care time)  Medications Ordered in UC Medications  - No data to display  Initial Impression / Assessment and Plan / UC Course  I have reviewed the triage vital signs and the nursing notes.  Pertinent labs & imaging results that were available during my care of the patient were reviewed by me and considered in my medical decision making (see chart for details).     MDM: 1.  Displaced fracture of distal phalanx of right little finger, initial encounter for closed fracture-right foot x-ray results revealed above right foot; postop shoe placed on right foot prior to discharge with specific instructions provided to patient for orthopedic follow-up. 2.  Injury of right toe, initial encounter-Rx'd Celebrex  200 mg capsule: Take 1 capsule daily x 15 days; 3.  Pain and swelling of toe, right-right foot x-ray results revealed above, Rx'd Celebrex  200 mg capsule: Take 1 capsule daily x 15 days. Advised patient to wear postop shoe 24/7 until being evaluated by [facility] orthopedics.  Contact information provided with his AVS today.  Advised patient to take medication as directed with food to completion.  Encouraged to increase daily water intake to 64 ounces per day while taking this medication.  Advised if symptoms worsen and/or unresolved please follow-up with your PCP or here for further evaluation.  Patient discharged home, hemodynamically stable. Final Clinical Impressions(s) / UC Diagnoses   Final diagnoses:  Pain and swelling of toe, right  Injury of right toe, initial encounter  Displaced fracture of distal phalanx of right little finger, initial encounter for closed fracture     Discharge Instructions      Advised patient to wear  postop shoe 24/7 until being evaluated by [facility] orthopedics.  Contact information provided with his AVS today.  Advised patient to take medication as directed with food to completion.  Encouraged to increase daily water intake to 64 ounces per day while taking this medication.  Advised if symptoms worsen and/or  unresolved please follow-up with your PCP or here for further evaluation.     ED Prescriptions     Medication Sig Dispense Auth. Provider   celecoxib  (CELEBREX ) 200 MG capsule Take 1 capsule (200 mg total) by mouth daily for 15 days. 15 capsule Dontarious Schaum, FNP      PDMP not reviewed this encounter.   Mia Sharper, FNP 06/07/24 1013

## 2024-06-13 ENCOUNTER — Ambulatory Visit: Admitting: Family Medicine

## 2024-06-13 ENCOUNTER — Telehealth: Payer: Self-pay

## 2024-06-13 VITALS — BP 132/92 | HR 79 | Ht 65.5 in | Wt 168.0 lb

## 2024-06-13 DIAGNOSIS — S92501A Displaced unspecified fracture of right lesser toe(s), initial encounter for closed fracture: Secondary | ICD-10-CM | POA: Diagnosis not present

## 2024-06-13 NOTE — Progress Notes (Signed)
   LILLETTE Ileana Collet, PhD, LAT, ATC acting as a scribe for Artist Lloyd, MD.  Mia Wilson is a 48 y.o. female who presents to [facility] today for R 2nd toe fx. On 7/14, pt was walking down steps when her dog accidentally tripped her causing her to fall. She was seen at the [facility]. She has been wearing a post-op shoe. Her pain is located on the distal portion of the R 2nd toe. +swelling.  Dx imaging: 06/07/24 R foot XR  Pertinent review of systems: No fevers or chills  Relevant historical information: Type 1.5 diabetes   Exam:  BP (!) 132/92   Pulse 79   Ht 5' 5.5 (1.664 m)   Wt 168 lb (76.2 kg)   LMP 11/10/2018 (Approximate)   SpO2 100%   BMI 27.53 kg/m  General: Well Developed, well nourished, and in no acute distress.   MSK: Right foot some swelling and tenderness at the second toe distal phalanx.    Lab and Radiology Results  DG Foot Complete Right Result Date: 06/07/2024 CLINICAL DATA:  injury, right foot pain after falling downstairs EXAM: RIGHT FOOT COMPLETE - 3+ VIEW COMPARISON:  None Available. FINDINGS: Minimally distracted, avulsion fracture through the dorsal aspect of the second distal phalanx. There is a proximally 1.2 cm of bony distraction. There is no evidence of arthropathy or other focal bone abnormality. Soft tissues are unremarkable. No radiopaque foreign body. IMPRESSION: Minimally distracted, avulsion fracture through the dorsal base of the second distal phalanx. Electronically Signed   By: Rogelia Myers M.D.   On: 06/07/2024 09:42   I, Artist Lloyd, personally (independently) visualized and performed the interpretation of the images attached in this note.      Assessment and Plan: 48 y.o. female with right second toe fracture.  Plan to continue postop shoe.  Will arrange for an x-ray to be done at the [facility] next week.  Okay to transition into a more normal shoe when feeling better.   PDMP  not reviewed this encounter. Orders Placed This Encounter  Procedures   DG Toe 2nd Right    Standing Status:   Future    Expiration Date:   06/13/2025    Reason for Exam (SYMPTOM  OR DIAGNOSIS REQUIRED):   right 2nd toe fracture    Is patient pregnant?:   No    Preferred imaging location?:   [facility]   No orders of the defined types were placed in this encounter.    Discussed warning signs or symptoms. Please see discharge instructions. Patient expresses understanding.   The above documentation has been reviewed and is accurate and complete Artist Lloyd, M.D.

## 2024-06-13 NOTE — Patient Instructions (Addendum)
 Thank you for coming in today.   Plan for xray at [facility] next week.   Send me a Clinical cytogeneticist message once you get it done and I will look at the pictures.   OK to transition to normal shoes when able.   Let me know if you are not getting better.

## 2024-06-13 NOTE — Telephone Encounter (Signed)
 Prior authorization submitted through [employer] with [employer]  for Auvelity  45-105 mg, a denial was received  It does not meet medical  necessity.  The requirement(s) not met are:  She has not tried and failed ALL formulary alternatives for her diagnosis such as, but not  limited to, amitriptyline, aripiprazole, fluoxetine, mirtazapine, venlafaxine.

## 2024-06-16 ENCOUNTER — Ambulatory Visit: Admitting: Professional Counselor

## 2024-06-16 ENCOUNTER — Encounter: Payer: Self-pay | Admitting: Professional Counselor

## 2024-06-16 DIAGNOSIS — F330 Major depressive disorder, recurrent, mild: Secondary | ICD-10-CM

## 2024-06-16 DIAGNOSIS — F411 Generalized anxiety disorder: Secondary | ICD-10-CM | POA: Diagnosis not present

## 2024-06-16 NOTE — Progress Notes (Signed)
      [facility] Counselor/Therapist Progress Note  [name], MRN: [id number],    Date: 06/16/2024  Time Spent: 2:07 PM - 3:14 PM   Treatment Type: Individual Therapy  Reported Symptoms: intermittent low mood, some hypervigilance, grief/loss, health concerns, phase of life concerns, parenting concerns, stress, worries  Mental Status Exam:  Appearance:   Casual     Behavior:  Appropriate, Sharing, and Motivated  Motor:  Normal  Speech/Language:   Clear and Coherent and Normal Rate  Affect:  Appropriate and Congruent  Mood:  normal  Thought process:  normal  Thought content:    WNL  Sensory/Perceptual disturbances:    WNL  Orientation:  oriented to person, place, time/date, and situation  Attention:  Good  Concentration:  Good  Memory:  WNL  Fund of knowledge:   Good  Insight:    Good  Judgment:   Good  Impulse Control:  Good   Risk Assessment: Danger to Self:  No Self-injurious Behavior: No Danger to Others: No Duty to Warn:no Physical Aggression / Violence:No  Access to Firearms a concern: No  Gang Involvement:No   Subjective: Patient presented to session to address concerns of anxiety and depression.  She reported mixed progress at this time.  She processed experience of rearranging her house to accommodate literal sense of boundaries as relates parenting concerns, and processed ongoing sense of hypervigilance and worries related to her sons.  Counselor and patient discussed patient plans for increased support for them.  Patient processed experience of grief and loss related to cousin and aunt passing away, with funeral pending for upcoming weekend.  She also processed experience of having broken her toe, managing her diabetes, and the celebration of her 48th birthday.  Counselor actively listened, affirmed patient feelings and experience, and reinforced patient strengths, resiliency and sense of her growth and healing.  Interventions:  Solution-Oriented/Positive Psychology, Humanistic/Existential, and Insight-Oriented  Diagnosis:   ICD-10-CM   1. Generalized anxiety disorder  F41.1     2. Major depressive disorder, recurrent episode, mild (HCC)  F33.0       Plan: Pt is scheduled for a follow-up;continue process work and Conservation officer, historic buildings. STG between sessions to continue to prioritize self care objectives.  Mia Wilson, [facility]

## 2024-06-20 ENCOUNTER — Telehealth: Payer: Self-pay | Admitting: Gastroenterology

## 2024-06-20 NOTE — Telephone Encounter (Signed)
 Good Afternoon Dr. Charlanne, I received a call from this patient requesting to reschedule her colonoscopy procedure that is scheduled for August the 1st due to her sister being in the hospital. Patient was reschedule for September the 17 th. Please advise.

## 2024-06-22 ENCOUNTER — Other Ambulatory Visit (HOSPITAL_COMMUNITY): Payer: Self-pay

## 2024-06-23 ENCOUNTER — Encounter: Admitting: Gastroenterology

## 2024-06-27 ENCOUNTER — Encounter: Payer: Self-pay | Admitting: Dietician

## 2024-06-27 ENCOUNTER — Encounter: Attending: Gastroenterology | Admitting: Dietician

## 2024-06-27 DIAGNOSIS — E139 Other specified diabetes mellitus without complications: Secondary | ICD-10-CM | POA: Insufficient documentation

## 2024-06-27 NOTE — Patient Instructions (Signed)
 New Goals:   Goal 1: get 10 minutes of exercise daily.   Goal 2: make sure to eat 3 meals per day.   Assessment and Continuation of Previous Goals Established by Patient:   Goal 1: go on a 10 minute walk around the neighborhood at least 3 days per week. - re-adjusted goal.    Goal 2: plan what you're going to eat the next day the night before. - goal in progress, continue.

## 2024-06-27 NOTE — Progress Notes (Signed)
 Diabetes Self-Management Education  Visit Type: Follow-up  Appt. Start Time: 1600 Appt. End Time: 1635  06/27/2024  [name], identified by name and date of birth, is a 48 y.o. female with a diagnosis of Diabetes: type 2  .   ASSESSMENT  This is a virtual visit Patient Location: Home Provider Location: Office   History includes: diabetes 1.5, GERD Labs noted: 04/28/24: A1c 7.3% Medications include: lantus , humalog ,  Supplements: vitamin b12, MVI Allergies/intolerances: tomato, strawberry, eggplant, red apples, peaches. (All may cause hives) Omnipod & Dexcom G6   A1c down from 7.6% to 7.3%.  Pt reports she has been eating better, focusing on getting protein with meals and snacks, and increasing water intake. Pt reports she aims for 2 42 oz water bottles daily and manages to meet her goal most days. Pt reports her kids have been trying to drink more water with her.   Pt states she has been trying to be a 'better vegetarian' and increase fruit and vegetable intake. Pt reports she has been having tofu, edamame, and peas at meals. Pt reports she has an alarm set at 12:30pm to eat lunch, but if she is running errands she may still forget.   Pt reports she broke her toe but despite has been walking the dogs 1-2 x/wk for 30 min, and walks at the park with her mom and sister on Saturday.   Pt reports since she has been working on eating more consistently and balancing meals she has noticed she no longer feels the need to take an afternoon nap.   New Goals:   Goal 1: get 10 minutes of exercise daily.   Goal 2: make sure to eat 3 meals per day.   Assessment and Continuation of Previous Goals Established by Patient:   Goal 1: go on a 10 minute walk around the neighborhood at least 3 days per week. - re-adjusted goal.    Goal 2: plan what you're going to eat the next day the night before. - goal in progress, continue.   Last menstrual period 11/10/2018. There is no height  or weight on file to calculate BMI.   Diabetes Self-Management Education - 06/27/24 1557       Visit Information   Visit Type Follow-up      Health Coping   How would you rate your overall health? Good      Psychosocial Assessment   Patient Belief/Attitude about Diabetes Motivated to manage diabetes    What is the hardest part about your diabetes right now, causing you the most concern, or is the most worrisome to you about your diabetes?   Making healty food and beverage choices    Self-care barriers None    Self-management support Doctor's office    Other persons present Patient    Patient Concerns Nutrition/Meal planning    Special Needs None    Preferred Learning Style No preference indicated    Learning Readiness Ready      Pre-Education Assessment   Patient understands the diabetes disease and treatment process. Needs Review    Patient understands incorporating nutritional management into lifestyle. Needs Review    Patient undertands incorporating physical activity into lifestyle. Needs Review    Patient understands using medications safely. Needs Review    Patient understands monitoring blood glucose, interpreting and using results Needs Review    Patient understands prevention, detection, and treatment of acute complications. Needs Review    Patient understands prevention, detection, and treatment of chronic complications.  Needs Review    Patient understands how to develop strategies to address psychosocial issues. Needs Review    Patient understands how to develop strategies to promote health/change behavior. Needs Review      Complications   Last HgB A1C per patient/outside source 7.3 %    How often do you check your blood sugar? > 4 times/day      Dietary Intake   Breakfast greek yogurt    Snack (morning) none    Lunch cauliflower rice and peas    Snack (afternoon) none    Dinner cauliflower rice and peas and protein shake    Snack (evening) italian ice     Beverage(s) 40-80 oz water, protein shake,      Activity / Exercise   Activity / Exercise Type Light (walking / raking leaves)    How many days per week do you exercise? 2    How many minutes per day do you exercise? 30    Total minutes per week of exercise 60      Patient Education   Previous Diabetes Education Yes    Disease Pathophysiology Explored patient's options for treatment of their diabetes;Factors that contribute to the development of diabetes    Healthy Eating Role of diet in the treatment of diabetes and the relationship between the three main macronutrients and blood glucose level;Plate Method;Meal timing in regards to the patients' current diabetes medication.;Meal options for control of blood glucose level and chronic complications.    Being Active Role of exercise on diabetes management, blood pressure control and cardiac health.;Helped patient identify appropriate exercises in relation to his/her diabetes, diabetes complications and other health issue.    Medications Reviewed patients medication for diabetes, action, purpose, timing of dose and side effects.    Monitoring Purpose and frequency of SMBG.    Chronic complications Relationship between chronic complications and blood glucose control;Identified and discussed with patient  current chronic complications    Diabetes Stress and Support Identified and addressed patients feelings and concerns about diabetes;Worked with patient to identify barriers to care and solutions    Lifestyle and Health Coping Lifestyle issues that need to be addressed for better diabetes care      Individualized Goals (developed by patient)   Nutrition General guidelines for healthy choices and portions discussed    Physical Activity Exercise 3-5 times per week;30 minutes per day    Medications take my medication as prescribed    Monitoring  Test my blood glucose as discussed    Problem Solving Eating Pattern    Reducing Risk examine blood  glucose patterns;do foot checks daily;treat hypoglycemia with 15 grams of carbs if blood glucose less than 70mg /dL    Health Coping Ask for help with psychological, social, or emotional issues      Patient Self-Evaluation of Goals - Patient rates self as meeting previously set goals (% of time)   Nutrition 50 - 75 % (half of the time)    Physical Activity 50 - 75 % (half of the time)    Medications >75% (most of the time)    Monitoring >75% (most of the time)    Problem Solving and behavior change strategies  50 - 75 % (half of the time)    Reducing Risk (treating acute and chronic complications) 50 - 75 % (half of the time)    Health Coping 50 - 75 % (half of the time)      Post-Education Assessment   Patient understands the diabetes  disease and treatment process. Comprehends key points    Patient understands incorporating nutritional management into lifestyle. Comprehends key points    Patient undertands incorporating physical activity into lifestyle. Comprehends key points    Patient understands using medications safely. Comphrehends key points    Patient understands monitoring blood glucose, interpreting and using results Comprehends key points    Patient understands prevention, detection, and treatment of acute complications. Comprehends key points    Patient understands prevention, detection, and treatment of chronic complications. Comprehends key points    Patient understands how to develop strategies to address psychosocial issues. Comprehends key points    Patient understands how to develop strategies to promote health/change behavior. Comprehends key points      Outcomes   Expected Outcomes Demonstrated interest in learning. Expect positive outcomes    Future DMSE 2 months    Program Status Not Completed      Subsequent Visit   Since your last visit have you continued or begun to take your medications as prescribed? Yes          Individualized Plan for Diabetes  Self-Management Training:   Learning Objective:  Patient will have a greater understanding of diabetes self-management. Patient education plan is to attend individual and/or group sessions per assessed needs and concerns.   Plan:   Patient Instructions  New Goals:   Goal 1: get 10 minutes of exercise daily.   Goal 2: make sure to eat 3 meals per day.   Assessment and Continuation of Previous Goals Established by Patient:   Goal 1: go on a 10 minute walk around the neighborhood at least 3 days per week. - re-adjusted goal.    Goal 2: plan what you're going to eat the next day the night before. - goal in progress, continue.   Expected Outcomes:  Demonstrated interest in learning. Expect positive outcomes  Education material provided: none  If problems or questions, patient to contact team via:  Phone  Future DSME appointment: 2 months

## 2024-06-29 ENCOUNTER — Other Ambulatory Visit: Payer: Self-pay | Admitting: Behavioral Health

## 2024-06-29 DIAGNOSIS — F902 Attention-deficit hyperactivity disorder, combined type: Secondary | ICD-10-CM

## 2024-06-30 ENCOUNTER — Telehealth: Payer: Self-pay | Admitting: Behavioral Health

## 2024-06-30 ENCOUNTER — Other Ambulatory Visit: Payer: Self-pay | Admitting: Behavioral Health

## 2024-06-30 ENCOUNTER — Encounter: Payer: Self-pay | Admitting: Professional Counselor

## 2024-06-30 ENCOUNTER — Ambulatory Visit: Admitting: Professional Counselor

## 2024-06-30 DIAGNOSIS — F330 Major depressive disorder, recurrent, mild: Secondary | ICD-10-CM | POA: Diagnosis not present

## 2024-06-30 DIAGNOSIS — F411 Generalized anxiety disorder: Secondary | ICD-10-CM

## 2024-06-30 DIAGNOSIS — Z23 Encounter for immunization: Secondary | ICD-10-CM

## 2024-06-30 NOTE — Telephone Encounter (Signed)
 I'm sure I have seen PA request for her Auvelity .

## 2024-06-30 NOTE — Progress Notes (Signed)
      [facility] Counselor/Therapist Progress Note  [name], MRN: [id number],    Date: 06/30/2024  Time Spent: 1:15 PM to 2:13 PM  Treatment Type: Individual Therapy  Reported Symptoms: Grief/loss, worries, preoccupying thoughts, restlessness, nervousness, anxiousness, frustration, intermittent low mood, fatigue, health concerns, interpersonal concerns, parenting concerns, caregiving responsibilities, stress  Mental Status Exam:  Appearance:   Neat     Behavior:  Appropriate, Sharing, and Motivated  Motor:  Normal  Speech/Language:   Clear and Coherent and Normal Rate  Affect:  Appropriate and Congruent  Mood:  normal  Thought process:  normal  Thought content:    WNL  Sensory/Perceptual disturbances:    WNL  Orientation:  oriented to person, place, time/date, and situation  Attention:  Good  Concentration:  Good  Memory:  WNL  Fund of knowledge:   Good  Insight:    Good  Judgment:   Good  Impulse Control:  Good   Risk Assessment: Danger to Self:  No Self-injurious Behavior: No Danger to Others: No Duty to Warn:no Physical Aggression / Violence:No  Access to Firearms a concern: No  Gang Involvement:No   Subjective: Patient presented to session to address concerns of anxiety and depression.  She reported mixed progress at this time.  She processed the experience of unfolding events as relate her children's needs, and counselor reinforced patient proactive efforts for their optimal care, and patient report of her own increased self-care.  Patient reported husband to be increasingly supportive as well, which counselor also reinforced as positive.  Patient processed grief and loss after recent funeral of aunt, and counselor held space for patient grief, and counselor and patient discussed funeral proceedings and the sadness that can follow after grief togetherness and activity.  Patient identified caring increasingly for her parents, and reflected on her  sense of gratitude for them and the positive impact they have had in her life.  Counselor reinforced patient strengths and values.  Interventions: Solution-Oriented/Positive Psychology, Humanistic/Existential, and Insight-Oriented  Diagnosis:   ICD-10-CM   1. Generalized anxiety disorder  F41.1     2. Major depressive disorder, recurrent episode, mild (HCC)  F33.0       Plan: Patient is scheduled for follow-up; continue process work and developing coping skills.  STG between sessions for patient to continue to resource support system for help with parenting concerns, and continue to prioritize self-care regimen and objectives.  Mia Wilson, [facility]

## 2024-06-30 NOTE — Telephone Encounter (Signed)
 Pt states her ins needs more info to pay for Auvelity . She wasn't sure if there was a prior authorization done. Said someone can call her if they need to ask more questions

## 2024-07-02 ENCOUNTER — Other Ambulatory Visit: Payer: Self-pay | Admitting: Behavioral Health

## 2024-07-02 DIAGNOSIS — F902 Attention-deficit hyperactivity disorder, combined type: Secondary | ICD-10-CM

## 2024-07-03 NOTE — Telephone Encounter (Signed)
 So she has tried 3 other AD's,  only 30% chance another typical AD will work. So if her insurance will not pay, we could try Pristiq . When she comes in I could review with her. She can get samples until her next appt.

## 2024-07-03 NOTE — Telephone Encounter (Signed)
 Pt is also over due for follow up apt with [facility]

## 2024-07-03 NOTE — Telephone Encounter (Signed)
 No this is what the previous message said about the denial, Pristiq  is not mentioned.   Prior authorization submitted through [employer] with [employer]  for Auvelity  45-105 mg, a denial was received  It does not meet medical  necessity.  The requirement(s) not met are:  She has not tried and failed ALL formulary alternatives for her diagnosis such as, but not  limited to, amitriptyline, aripiprazole, fluoxetine, mirtazapine, venlafaxine.

## 2024-07-03 NOTE — Telephone Encounter (Signed)
 There is a phone message dated 7/22 with requirements of her PA. Sent to [place] for review on how to proceed.

## 2024-07-03 NOTE — Telephone Encounter (Signed)
 Advise pt and see if she is willing to try Effexor. If so pend Effexor 37.5 mg ER. She can reduce Auvelity  to one tablet per day for one week and then stop, she can come by for more samples if needed to wean.

## 2024-07-05 NOTE — Telephone Encounter (Signed)
 She has apt tomorrow 07/06/24 discuss options then

## 2024-07-06 ENCOUNTER — Ambulatory Visit: Admitting: Behavioral Health

## 2024-07-06 ENCOUNTER — Encounter: Payer: Self-pay | Admitting: Behavioral Health

## 2024-07-06 VITALS — Wt 172.0 lb

## 2024-07-06 DIAGNOSIS — F330 Major depressive disorder, recurrent, mild: Secondary | ICD-10-CM | POA: Diagnosis not present

## 2024-07-06 DIAGNOSIS — F411 Generalized anxiety disorder: Secondary | ICD-10-CM

## 2024-07-06 MED ORDER — DESVENLAFAXINE SUCCINATE ER 50 MG PO TB24: 50.0000 mg | ORAL_TABLET | Freq: Every day | ORAL | 1 refills | Status: DC

## 2024-07-06 NOTE — Progress Notes (Deleted)
 [facility] Med Check  [name],  MRN: [id number]  PCP: de [place], Raymond J, MD  Date of Evaluation: 07/06/2024 Time spent:{TIME; 0 MIN TO 60 MIN:[phone]}  Chief Complaint:   HISTORY/CURRENT STATUS: HPI  Individual Medical History/ Review of Systems: Changes? :{EXAM; YES/NO:21197}  Allergies: Mounjaro  [tirzepatide ], Other, Bupropion, Strawberry extract, and Tomato  Current Medications:  Current Outpatient Medications:    atomoxetine  (STRATTERA ) 60 MG capsule, TAKE 1 CAPSULE BY MOUTH EVERY DAY, Disp: 30 capsule, Rfl: 0   atorvastatin (LIPITOR) 10 MG tablet, Take 10 mg by mouth daily., Disp: , Rfl:    Continuous Glucose Sensor (DEXCOM G7 SENSOR) MISC, Use to check glucose continuously, change sensor every 10 days, Disp: 9 each, Rfl: 3   Continuous Glucose Transmitter (DEXCOM G6 TRANSMITTER) MISC, AS DIRECTED CHANGE EVERY 90 DAYS, Disp: 1 each, Rfl: 3   Dextromethorphan-buPROPion ER (AUVELITY ) 45-105 MG TBCR, Take one tablet by mouth two times daily at least 8 hours between doses, Disp: 60 tablet, Rfl: 3   DULoxetine  (CYMBALTA ) 60 MG capsule, Take 1 capsule (60 mg total) by mouth daily., Disp: 90 capsule, Rfl: 1   estradiol (VIVELLE-DOT) 0.05 MG/24HR patch, Place 1 patch onto the skin 2 (two) times a week., Disp: , Rfl:    famotidine  (PEPCID ) 20 MG tablet, Take 1 tablet (20 mg total) by mouth at bedtime., Disp: 30 tablet, Rfl: 2   Glucagon  (GVOKE HYPOPEN  1-PACK) 1 MG/0.2ML SOAJ, Inject 1 mg into the skin as needed (low blood sugar with impaired consciousness)., Disp: 0.4 mL, Rfl: 2   Insulin  Disposable Pump (OMNIPOD 5 G7 PODS, GEN 5,) MISC, Inject 1 Device into the skin every 3 (three) days. 1 DEVICE BY DOES NOT APPLY ROUTE EVERY 3 (THREE) DAYS., Disp: 30 each, Rfl: 2   insulin  glargine (LANTUS  SOLOSTAR) 100 UNIT/ML Solostar Pen, Inject 14 Units into the skin daily. In case of pump failure, Disp: 6 mL, Rfl: 1   insulin  lispro (HUMALOG ) 100 UNIT/ML injection,  INJECT UPTO 50 UNITS INTO THE SKIN DAILY. USE MAX OF 50 UNITS DAILY VIA INSULIN  PUMP, Disp: 75 mL, Rfl: 3   Multiple Vitamin (MULTIVITAMIN WITH MINERALS) TABS, Take 1 tablet by mouth daily., Disp: , Rfl:    pantoprazole  (PROTONIX ) 40 MG tablet, TAKE 1 TABLET BY MOUTH EVERY DAY, Disp: 90 tablet, Rfl: 0   progesterone (PROMETRIUM) 100 MG capsule, , Disp: , Rfl:    vitamin B-12 (CYANOCOBALAMIN ) 1000 MCG tablet, Take 1,000 mcg by mouth daily., Disp: , Rfl:  Medication Side Effects: {Medication Side Effects (Optional):12147}  Family Medical/ Social History: Changes? {EXAM; YES/NO:19492}  MENTAL HEALTH EXAM:  Last menstrual period 11/10/2018.There is no height or weight on file to calculate BMI.  General Appearance: {PSY:[phone]}  Eye Contact:  {PSY:22684}  Speech:  {PSY:[phone]}  Volume:  {PSY:22686}  Mood:  {PSY:22306}  Affect:  {PSY:[phone]}  Thought Process:  {PSY:22688}  Orientation:  {PSY:22689}  Thought Content: {PSYt:22690}   Suicidal Thoughts:  {PSY:22692}  Homicidal Thoughts:  {PSY:22692}  Memory:  {PSY:[phone]}  Judgement:  {PSY:22694}  Insight:  {PSY:22695}  Psychomotor Activity:  {PSY:22696}  Concentration:  {PSY:21399}  Recall:  {PSY:22877}  Fund of Knowledge: {PSY:22877}  Language: {EDB:77122}  Assets:  {PSY:22698}  ADL's:  {PSY:22290}  Cognition: {PSY:[id number]}  Prognosis:  {PSY:22877}    DIAGNOSES:    ICD-10-CM   1. Generalized anxiety disorder  F41.1     2. Major depressive disorder, recurrent episode, mild (HCC)  F33.0       Receiving Psychotherapy: {EDB:78802}  RECOMMENDATIONS: ***   [place] DELENA Pizza, NP

## 2024-07-06 NOTE — Progress Notes (Signed)
 [facility] Med Check  [name],  MRN: [id number]  PCP: [place], Mia J, MD  Date of Evaluation: 07/06/2024 Time spent:20 minutes  Chief Complaint:  Chief Complaint   Depression; Anxiety; Follow-up; Medication Refill; Patient Education; Medication Problem     HISTORY/CURRENT STATUS: HPI Mia Wilson, 48 year old female presents to this office for follow up and medication management.  Collateral information should be considered reliable. She is more calm this visit and less talkative. She says that after realizing there was going to be coverage problems with Auvelity , she started to wean down to one tablet per day. She also has not been taking her Duloxetine  in over two week.  She wanted to explore other medication options today due to some lingering depression and anxiety. Noticed that she wore same clothes for 4 days and was not getting out of the house. She mentioned to her therapist.  She reports her anxiety today 2/10, and depression at 5/10.  She denies history of mania, no psychosis, no auditory or visual hallucinations.  Denies SI or HI.   Past psychiatric medication trials: Zoloft Wellbutrin Lexapro Cymbalta  Strattera  Individual Medical History/ Review of Systems: Changes? :No   Allergies: Mounjaro  [tirzepatide ], Other, Bupropion, Strawberry extract, and Tomato  Current Medications:  Current Outpatient Medications:    desvenlafaxine  (PRISTIQ ) 50 MG 24 hr tablet, Take 1 tablet (50 mg total) by mouth daily., Disp: 30 tablet, Rfl: 1   atomoxetine  (STRATTERA ) 60 MG capsule, TAKE 1 CAPSULE BY MOUTH EVERY DAY, Disp: 30 capsule, Rfl: 0   atorvastatin (LIPITOR) 10 MG tablet, Take 10 mg by mouth daily., Disp: , Rfl:    Continuous Glucose Sensor (DEXCOM G7 SENSOR) MISC, Use to check glucose continuously, change sensor every 10 days, Disp: 9 each, Rfl: 3   Continuous Glucose Transmitter (DEXCOM G6 TRANSMITTER) MISC, AS DIRECTED CHANGE EVERY 90 DAYS, Disp: 1  each, Rfl: 3   Dextromethorphan-buPROPion ER (AUVELITY ) 45-105 MG TBCR, Take one tablet by mouth two times daily at least 8 hours between doses, Disp: 60 tablet, Rfl: 3   DULoxetine  (CYMBALTA ) 60 MG capsule, Take 1 capsule (60 mg total) by mouth daily., Disp: 90 capsule, Rfl: 1   estradiol (VIVELLE-DOT) 0.05 MG/24HR patch, Place 1 patch onto the skin 2 (two) times a week., Disp: , Rfl:    famotidine  (PEPCID ) 20 MG tablet, Take 1 tablet (20 mg total) by mouth at bedtime., Disp: 30 tablet, Rfl: 2   Glucagon  (GVOKE HYPOPEN  1-PACK) 1 MG/0.2ML SOAJ, Inject 1 mg into the skin as needed (low blood sugar with impaired consciousness)., Disp: 0.4 mL, Rfl: 2   Insulin  Disposable Pump (OMNIPOD 5 G7 PODS, GEN 5,) MISC, Inject 1 Device into the skin every 3 (three) days. 1 DEVICE BY DOES NOT APPLY ROUTE EVERY 3 (THREE) DAYS., Disp: 30 each, Rfl: 2   insulin  glargine (LANTUS  SOLOSTAR) 100 UNIT/ML Solostar Pen, Inject 14 Units into the skin daily. In case of pump failure, Disp: 6 mL, Rfl: 1   insulin  lispro (HUMALOG ) 100 UNIT/ML injection, INJECT UPTO 50 UNITS INTO THE SKIN DAILY. USE MAX OF 50 UNITS DAILY VIA INSULIN  PUMP, Disp: 75 mL, Rfl: 3   Multiple Vitamin (MULTIVITAMIN WITH MINERALS) TABS, Take 1 tablet by mouth daily., Disp: , Rfl:    pantoprazole  (PROTONIX ) 40 MG tablet, TAKE 1 TABLET BY MOUTH EVERY DAY, Disp: 90 tablet, Rfl: 0   progesterone (PROMETRIUM) 100 MG capsule, , Disp: , Rfl:    vitamin B-12 (CYANOCOBALAMIN ) 1000 MCG tablet, Take 1,000 mcg  by mouth daily., Disp: , Rfl:  Medication Side Effects: none  Family Medical/ Social History: Changes? No  MENTAL HEALTH EXAM:  Weight 172 lb (78 kg), last menstrual period 11/10/2018.Body mass index is 28.19 kg/m.  General Appearance: Casual, Neat, and Well Groomed  Eye Contact:  Good  Speech:  Clear and Coherent  Volume:  Normal  Mood:  Anxious and Depressed  Affect:  Congruent, Depressed, and Anxious  Thought Process:  Coherent  Orientation:  Full  (Time, Place, and Person)  Thought Content: Logical   Suicidal Thoughts:  No  Homicidal Thoughts:  No  Memory:  WNL  Judgement:  Good  Insight:  Good  Psychomotor Activity:  Normal  Concentration:  Concentration: Good  Recall:  Good  Fund of Knowledge: Good  Language: Good  Assets:  Desire for Improvement  ADL's:  Intact  Cognition: WNL  Prognosis:  Good    DIAGNOSES:    ICD-10-CM   1. Generalized anxiety disorder  F41.1 desvenlafaxine  (PRISTIQ ) 50 MG 24 hr tablet    2. Major depressive disorder, recurrent episode, mild (HCC)  F33.0 desvenlafaxine  (PRISTIQ ) 50 MG 24 hr tablet      Receiving Psychotherapy: No    RECOMMENDATIONS:   Greater than 50% of 30 min face to face time with patient was spent on counseling and coordination of care. Discussed her continued symptoms of mild depression.  She has been weaning herself off her Auvelity  and Cymbalta  recently. I explained that she could be having some withdrawal like symptoms do to going cold [place] on Cymbalta  and relapse of depression. Strattera  continues to work well. She would like to consider a different medication for anxiety and depression at this time.   We agreed today to: To start Pristiq  50 mg daily in the am Continue   taking Strattera  60  mg daily To follow-up in 4 weeks to reassess Will report worsening symptoms or side effects promptly Provided emergency contact information Discussed potential metabolic side effects associated with atypical antipsychotics, as well as potential risk for movement side effects. Advised pt to contact office if movement side effects occur.   Reviewed PDMP    [place] DELENA Pizza, NP

## 2024-07-14 ENCOUNTER — Ambulatory Visit: Admitting: Professional Counselor

## 2024-07-26 ENCOUNTER — Other Ambulatory Visit

## 2024-07-26 DIAGNOSIS — E1165 Type 2 diabetes mellitus with hyperglycemia: Secondary | ICD-10-CM | POA: Diagnosis not present

## 2024-07-26 DIAGNOSIS — Z794 Long term (current) use of insulin: Secondary | ICD-10-CM | POA: Diagnosis not present

## 2024-07-27 LAB — COMPREHENSIVE METABOLIC PANEL WITH GFR
AG Ratio: 1.7 (calc) (ref 1.0–2.5)
ALT: 25 U/L (ref 6–29)
AST: 23 U/L (ref 10–35)
Albumin: 4.2 g/dL (ref 3.6–5.1)
Alkaline phosphatase (APISO): 102 U/L (ref 37–153)
BUN: 11 mg/dL (ref 7–25)
CO2: 30 mmol/L (ref 20–32)
Calcium: 9.2 mg/dL (ref 8.6–10.4)
Chloride: 100 mmol/L (ref 98–110)
Creat: 0.64 mg/dL (ref 0.50–1.03)
Globulin: 2.5 g/dL (ref 1.9–3.7)
Glucose, Bld: 93 mg/dL (ref 65–99)
Potassium: 4.4 mmol/L (ref 3.5–5.3)
Sodium: 138 mmol/L (ref 135–146)
Total Bilirubin: 1 mg/dL (ref 0.2–1.2)
Total Protein: 6.7 g/dL (ref 6.1–8.1)
eGFR: 108 mL/min/1.73m2 (ref >=60)

## 2024-07-27 LAB — LIPID PANEL
Cholesterol: 186 mg/dL (ref <200)
HDL: 90 mg/dL (ref >=50)
LDL Cholesterol (Calc): 84 mg/dL
Non-HDL Cholesterol (Calc): 96 mg/dL (ref <130)
Total CHOL/HDL Ratio: 2.1 (calc) (ref <5.0)
Triglycerides: 42 mg/dL (ref <150)

## 2024-07-27 LAB — MICROALBUMIN / CREATININE URINE RATIO
Creatinine, Urine: 87 mg/dL (ref 20–275)
Microalb, Ur: <0.2 mg/dL

## 2024-07-30 ENCOUNTER — Other Ambulatory Visit: Payer: Self-pay | Admitting: Medical Genetics

## 2024-07-31 ENCOUNTER — Ambulatory Visit: Admitting: "Endocrinology

## 2024-07-31 ENCOUNTER — Other Ambulatory Visit: Payer: Self-pay | Admitting: Behavioral Health

## 2024-07-31 ENCOUNTER — Encounter: Payer: Self-pay | Admitting: "Endocrinology

## 2024-07-31 VITALS — BP 100/70 | HR 98 | Ht 65.5 in | Wt 174.0 lb

## 2024-07-31 DIAGNOSIS — E1065 Type 1 diabetes mellitus with hyperglycemia: Secondary | ICD-10-CM

## 2024-07-31 DIAGNOSIS — Z794 Long term (current) use of insulin: Secondary | ICD-10-CM

## 2024-07-31 DIAGNOSIS — F330 Major depressive disorder, recurrent, mild: Secondary | ICD-10-CM

## 2024-07-31 DIAGNOSIS — Z9641 Presence of insulin pump (external) (internal): Secondary | ICD-10-CM

## 2024-07-31 DIAGNOSIS — E7800 Pure hypercholesterolemia, unspecified: Secondary | ICD-10-CM | POA: Diagnosis not present

## 2024-07-31 DIAGNOSIS — F411 Generalized anxiety disorder: Secondary | ICD-10-CM

## 2024-07-31 LAB — POCT GLYCOSYLATED HEMOGLOBIN (HGB A1C): Hemoglobin A1C: 8.5 % — AB (ref 4.0–5.6)

## 2024-07-31 MED ORDER — ATORVASTATIN CALCIUM 10 MG PO TABS: 20.0000 mg | ORAL_TABLET | Freq: Every day | ORAL | 1 refills | Status: DC

## 2024-07-31 NOTE — Patient Instructions (Signed)

## 2024-07-31 NOTE — Progress Notes (Signed)
 Outpatient Endocrinology Note Mia Birmingham, MD  07/31/24   Mia Wilson [birth year] [id number]  Referring Provider: de Wilson, Mia J, MD Primary Care Provider: de Wilson, Mia J, MD Reason for consultation: Subjective   Assessment & Plan  Diagnoses and all orders for this visit:  Uncontrolled type 1 diabetes mellitus with hyperglycemia (HCC) -     POCT glycosylated hemoglobin (Hb A1C)  Insulin  pump in place  Insulin  dose changed (HCC)  Pure hypercholesterolemia  Other orders -     atorvastatin  (LIPITOR) 10 MG tablet; Take 2 tablets (20 mg total) by mouth daily.   Diabetes Type I complicated by hyperglycemia C-pep 0.5, BG 212, Insulin  Ab + Lab Results  Component Value Date   GFR 92.07 10/13/2023   Hba1c goal less than 7, current Hba1c is  Lab Results  Component Value Date   HGBA1C 8.5 (A) 07/31/2024   Will recommend the following: OMNIPOD 5 insulin  pump with Humalog  and DexCom G7 BASAL RATES: Midnight = 0.3, 5 AM = 0.45, 9 AM = 0.3 and 7 PM = 0. 45 Bolus settings: I/C ratio 8 (5pm-12am: 8) ISF: 50, Target: 110, with correction over 130.  Stopped farxiga   No known contraindications/side effects to any of above medications Glucagon  discussed and prescribed with refills on 10/20/23  -Last LD and Tg are as follows: Lab Results  Component Value Date   LDLCALC 84 07/26/2024    Lab Results  Component Value Date   TRIG 42 07/26/2024   -On atorvastatin  10 mg QD -Follow low fat diet and exercise   -Blood pressure goal <140/90 - Microalbumin/creatinine goal is < 30 -Last MA/Cr is as follows: Lab Results  Component Value Date   MICROALBUR <0.2 07/26/2024   -not on ACE/ARB  -diet changes including salt restriction -limit eating outside -counseled BP targets per standards of diabetes care -uncontrolled blood pressure can lead to retinopathy, nephropathy and cardiovascular and atherosclerotic heart disease  Reviewed and counseled on: -A1C  target -Blood sugar targets -Complications of uncontrolled diabetes  -Checking blood sugar before meals and bedtime and bring log next visit -All medications with mechanism of action and side effects -Hypoglycemia management: rule of 15's, Glucagon  Emergency Kit and medical alert ID -low-carb low-fat plate-method diet -At least 20 minutes of physical activity per day -Annual dilated retinal eye exam and foot exam -compliance and follow up needs -follow up as scheduled or earlier if problem gets worse  Call if blood sugar is less than 70 or consistently above 250    Take a 15 gm snack of carbohydrate at bedtime before you go to sleep if your blood sugar is less than 100.    If you are going to fast after midnight for a test or procedure, ask your physician for instructions on how to reduce/decrease your insulin  dose.    Call if blood sugar is less than 70 or consistently above 250  -Treating a low sugar by rule of 15  (15 gms of sugar every 15 min until sugar is more than 70) If you feel your sugar is low, test your sugar to be sure If your sugar is low (less than 70), then take 15 grams of a fast acting Carbohydrate (3-4 glucose tablets or glucose gel or 4 ounces of juice or regular soda) Recheck your sugar 15 min after treating low to make sure it is more than 70 If sugar is still less than 70, treat again with 15 grams of carbohydrate  Don't drive the hour of hypoglycemia  If unconscious/unable to eat or drink by mouth, use glucagon  injection or nasal spray baqsimi and call 911. Can repeat again in 15 min if still unconscious.  Return in about 3 months (around 10/30/2024).   I have reviewed current medications, nurse's notes, allergies, vital signs, past medical and surgical history, family medical history, and social history for this encounter. Counseled patient on symptoms, examination findings, lab findings, imaging results, treatment decisions and monitoring and prognosis.  The patient understood the recommendations and agrees with the treatment plan. All questions regarding treatment plan were fully answered.  Mia Birmingham, MD  07/31/24   History of Present Illness Mia Wilson is a 48 y.o. year old female who presents for follow up of Type II diabetes mellitus.  Mia Wilson was first diagnosed in 2009.   Diabetes education +  Diagnosis: date of diagnosis: 2009, initially asymptomatic  RECENT history:   Current pump settings:  OMNIPOD 5 insulin  pump with Humalog  and DexCom G7 BASAL RATES: Midnight = 0.3, 5 AM = 0.45, 9 AM = 0.3 and 7 PM = 0. 45 Bolus settings: I/C ratio 9 (5pm-12am: 9) ISF: 50, Target: 110, with correction over 130.  Insulin : Currently using Humalog   Non-insulin  hypoglycemic drugs: stopped Farxiga  10 mg daily  COMPLICATIONS -  MI/Stroke -  retinopathy -  neuropathy -  nephropathy  BLOOD SUGAR DATA CGM interpretation: At today's visit, we reviewed her CGM downloads. The full report is scanned in the media. Reviewing the CGM trends, BG are mildly elevated after meals.    Physical Exam  BP 100/70   Pulse 98   Ht 5' 5.5 (1.664 m)   Wt 174 lb (78.9 kg)   LMP 11/10/2018 (Approximate)   SpO2 98%   BMI 28.51 kg/m    Constitutional: well developed, well nourished Head: normocephalic, atraumatic Eyes: sclera anicteric, no redness Neck: supple Lungs: normal respiratory effort Neurology: alert and oriented Skin: dry, no appreciable rashes Musculoskeletal: no appreciable defects Psychiatric: normal mood and affect Diabetic Foot Exam - Simple   Simple Foot Form Diabetic Foot exam was performed with the following findings: Yes 07/31/2024  9:37 AM  Visual Inspection No deformities, no ulcerations, no other skin breakdown bilaterally: Yes Sensation Testing Intact to touch and monofilament testing bilaterally: Yes Pulse Check Posterior Tibialis and Dorsalis pulse intact bilaterally: Yes Comments       Current Medications Patient's Medications  New Prescriptions   No medications on file  Previous Medications   ATOMOXETINE  (STRATTERA ) 60 MG CAPSULE    TAKE 1 CAPSULE BY MOUTH EVERY DAY   CONTINUOUS GLUCOSE SENSOR (DEXCOM G7 SENSOR) MISC    Use to check glucose continuously, change sensor every 10 days   DESVENLAFAXINE  (PRISTIQ ) 50 MG 24 HR TABLET    Take 1 tablet (50 mg total) by mouth daily.   DEXTROMETHORPHAN-BUPROPION ER (AUVELITY ) 45-105 MG TBCR    Take one tablet by mouth two times daily at least 8 hours between doses   DULOXETINE  (CYMBALTA ) 60 MG CAPSULE    Take 1 capsule (60 mg total) by mouth daily.   ESTRADIOL (VIVELLE-DOT) 0.05 MG/24HR PATCH    Place 1 patch onto the skin 2 (two) times a week.   FAMOTIDINE  (PEPCID ) 20 MG TABLET    Take 1 tablet (20 mg total) by mouth at bedtime.   GLUCAGON  (GVOKE HYPOPEN  1-PACK) 1 MG/0.2ML SOAJ    Inject 1 mg into the skin as needed (low blood sugar with impaired  consciousness).   INSULIN  DISPOSABLE PUMP (OMNIPOD 5 G7 PODS, GEN 5,) MISC    Inject 1 Device into the skin every 3 (three) days. 1 DEVICE BY DOES NOT APPLY ROUTE EVERY 3 (THREE) DAYS.   INSULIN  GLARGINE (LANTUS  SOLOSTAR) 100 UNIT/ML SOLOSTAR PEN    Inject 14 Units into the skin daily. In case of pump failure   INSULIN  LISPRO (HUMALOG ) 100 UNIT/ML INJECTION    INJECT UPTO 50 UNITS INTO THE SKIN DAILY. USE MAX OF 50 UNITS DAILY VIA INSULIN  PUMP   MULTIPLE VITAMIN (MULTIVITAMIN WITH MINERALS) TABS    Take 1 tablet by mouth daily.   PANTOPRAZOLE  (PROTONIX ) 40 MG TABLET    TAKE 1 TABLET BY MOUTH EVERY DAY   PROGESTERONE (PROMETRIUM) 100 MG CAPSULE       VITAMIN B-12 (CYANOCOBALAMIN ) 1000 MCG TABLET    Take 1,000 mcg by mouth daily.  Modified Medications   Modified Medication Previous Medication   ATORVASTATIN  (LIPITOR) 10 MG TABLET atorvastatin  (LIPITOR) 10 MG tablet      Take 2 tablets (20 mg total) by mouth daily.    Take 10 mg by mouth daily.  Discontinued Medications   No medications on  file    Allergies Allergies  Allergen Reactions   Mounjaro  [Tirzepatide ] Diarrhea and Nausea And Vomiting    Lost 15 pounds in 1 week.  Required IV fluids   Other Anaphylaxis and Hives    Peaches , tomatoes, strawberry   Bupropion     Other Reaction(s): doesn't tolerate doses over 300mg    Strawberry Extract Hives    Facial hives and mouth hives   Tomato Hives    Facial hives and mouth hives    Past Medical History 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)    Fibroids    PCOS (polycystic ovarian syndrome)     Past Surgical History Past Surgical History:  Procedure Laterality Date   BREAST EXCISIONAL BIOPSY Right    BREAST SURGERY     benign   IR ANGIOGRAM PELVIS SELECTIVE OR SUPRASELECTIVE  11/17/2018   IR ANGIOGRAM PELVIS SELECTIVE OR SUPRASELECTIVE  11/17/2018   IR ANGIOGRAM SELECTIVE EACH ADDITIONAL VESSEL  11/17/2018   IR ANGIOGRAM SELECTIVE EACH ADDITIONAL VESSEL  11/17/2018   IR EMBO TUMOR ORGAN ISCHEMIA INFARCT INC GUIDE ROADMAPPING  11/17/2018   IR RADIOLOGIST EVAL & MGMT  09/14/2018   IR RADIOLOGIST EVAL & MGMT  12/13/2018   IR RADIOLOGIST EVAL & MGMT  06/28/2019   IR US  GUIDE VASC ACCESS RIGHT  11/17/2018   UTERINE FIBROID SURGERY      Family History family history includes Breast cancer in her maternal grandmother; Cancer in an other family member; Diabetes in her maternal grandmother, maternal uncle, and paternal grandmother; Healthy in her father and mother; Heart disease in her maternal grandmother and paternal uncle; Hyperlipidemia in an other family member; Hypertension in an other family member; Stroke in an other family member.  Social History Social History   Socioeconomic History   Marital status: Married    Spouse name: Not on file   Number of children: 4   Years of education: Not on file   Highest education level: Not on file  Occupational History   Occupation: research admn.  Tobacco Use    Smoking status: Never    Passive exposure: Never   Smokeless tobacco: Never  Vaping Use   Vaping status: Never Used  Substance and Sexual Activity  Alcohol use: No   Drug use: No   Sexual activity: Yes  Other Topics Concern   Not on file  Social History Narrative   Living in [place] with husband and two adopted children 15 and 80. Currently on FMLA.  She is not employed at this time. She has been her 2 youngest adopted children at home. Not involved a lot else here at this time. She has 2 other children also. She walks a little bit but does not do a lot since she has left her with fatigue.   Social Drivers of Corporate investment banker Strain: Not on file  Food Insecurity: No Food Insecurity (03/22/2024)   Hunger Vital Sign    Worried About Running Out of Food in the Last Year: Never true    Ran Out of Food in the Last Year: Never true  Transportation Needs: Not on file  Physical Activity: Not on file  Stress: Stress Concern Present (05/19/2023)   [employer] of Occupational Health - Occupational Stress Questionnaire    Feeling of Stress : Rather much  Social Connections: Unknown (05/19/2023)   Social Connection and Isolation Panel    Frequency of Communication with Friends and Family: More than three times a week    Frequency of Social Gatherings with Friends and Family: More than three times a week    Attends Religious Services: More than 4 times per year    Active Member of [employer] or Organizations: Not on file    Attends Banker Meetings: Not on file    Marital Status: Married  Intimate Partner Violence: Unknown (02/26/2022)   Received from [facility]   HITS    Physically Hurt: Not on file    Insult or Talk Down To: Not on file    Threaten Physical Harm: Not on file    Scream or Curse: Not on file    Lab Results  Component Value Date   HGBA1C 8.5 (A) 07/31/2024   HGBA1C 7.3 (A) 04/28/2024   HGBA1C 7.6 (A) 01/27/2024   Lab Results  Component  Value Date   CHOL 186 07/26/2024   Lab Results  Component Value Date   HDL 90 07/26/2024   Lab Results  Component Value Date   LDLCALC 84 07/26/2024   Lab Results  Component Value Date   TRIG 42 07/26/2024   Lab Results  Component Value Date   CHOLHDL 2.1 07/26/2024   Lab Results  Component Value Date   CREATININE 0.64 07/26/2024   Lab Results  Component Value Date   GFR 92.07 10/13/2023   Lab Results  Component Value Date   MICROALBUR <0.2 07/26/2024      Component Value Date/Time   NA 138 07/26/2024 1514   NA 143 02/10/2024 1600   K 4.4 07/26/2024 1514   CL 100 07/26/2024 1514   CO2 30 07/26/2024 1514   GLUCOSE 93 07/26/2024 1514   BUN 11 07/26/2024 1514   BUN 18 02/10/2024 1600   CREATININE 0.64 07/26/2024 1514   CALCIUM  9.2 07/26/2024 1514   PROT 6.7 07/26/2024 1514   PROT 8.3 02/10/2024 1600   ALBUMIN 5.0 (H) 02/10/2024 1600   AST 23 07/26/2024 1514   ALT 25 07/26/2024 1514   ALKPHOS 143 (H) 02/10/2024 1600   BILITOT 1.0 07/26/2024 1514   BILITOT 1.6 (H) 02/10/2024 1600   GFRNONAA >60 11/15/2023 1609   GFRAA >60 11/17/2018 0758      Latest Ref Rng & Units 07/26/2024  3:14 PM 02/10/2024    4:00 PM 11/18/2023   10:49 AM  BMP  Glucose 65 - 99 mg/dL 93  837  787   BUN 7 - 25 mg/dL 11  18  11    Creatinine 0.50 - 1.03 mg/dL 9.35  8.87  9.28   BUN/Creat Ratio 6 - 22 (calc) SEE NOTE:  16  SEE NOTE:   Sodium 135 - 146 mmol/L 138  143  140   Potassium 3.5 - 5.3 mmol/L 4.4  5.2  4.4   Chloride 98 - 110 mmol/L 100  102  103   CO2 20 - 32 mmol/L 30  CANCELED  24   Calcium  8.6 - 10.4 mg/dL 9.2  89.8  9.3        Component Value Date/Time   WBC CANCELED 02/10/2024 1600   WBC 7.0 11/15/2023 1609   RBC CANCELED 02/10/2024 1600   RBC 4.48 11/15/2023 1609   HGB CANCELED 02/10/2024 1600   HCT CANCELED 02/10/2024 1600   PLT CANCELED 02/10/2024 1600   MCV 83.7 11/15/2023 1609   MCV 78.0 (A) 04/30/2012 1351   MCH 26.3 11/15/2023 1609   MCHC 31.5  11/15/2023 1609   RDW 13.5 11/15/2023 1609   LYMPHSABS CANCELED 02/10/2024 1600   MONOABS 0.6 11/15/2023 1609   EOSABS CANCELED 02/10/2024 1600   BASOSABS CANCELED 02/10/2024 1600     Parts of this note may have been dictated using voice recognition software. There may be variances in spelling and vocabulary which are unintentional. Not all errors are proofread. Please notify the dino if any discrepancies are noted or if the meaning of any statement is not clear.

## 2024-08-01 ENCOUNTER — Encounter: Payer: Self-pay | Admitting: "Endocrinology

## 2024-08-03 ENCOUNTER — Encounter: Payer: Self-pay | Admitting: Behavioral Health

## 2024-08-03 ENCOUNTER — Ambulatory Visit (INDEPENDENT_AMBULATORY_CARE_PROVIDER_SITE_OTHER): Admitting: Behavioral Health

## 2024-08-03 DIAGNOSIS — F411 Generalized anxiety disorder: Secondary | ICD-10-CM | POA: Diagnosis not present

## 2024-08-03 DIAGNOSIS — F902 Attention-deficit hyperactivity disorder, combined type: Secondary | ICD-10-CM | POA: Diagnosis not present

## 2024-08-03 DIAGNOSIS — F330 Major depressive disorder, recurrent, mild: Secondary | ICD-10-CM | POA: Diagnosis not present

## 2024-08-03 MED ORDER — ATOMOXETINE HCL 60 MG PO CAPS: 60.0000 mg | ORAL_CAPSULE | Freq: Every day | ORAL | 3 refills | Status: DC

## 2024-08-03 MED ORDER — DESVENLAFAXINE SUCCINATE ER 50 MG PO TB24: 50.0000 mg | ORAL_TABLET | Freq: Every day | ORAL | 2 refills | Status: DC

## 2024-08-03 NOTE — Progress Notes (Signed)
 [facility]  [name],  MRN: [id number]  PCP: de Peru, Raymond J, MD  Date of Evaluation: 08/03/2024 Time spent:30 minutes  Chief Complaint:  Chief Complaint   Depression; Anxiety; Medication Refill; Stress; Patient Education; Follow-up     HISTORY/CURRENT STATUS: HPI  Mia Wilson, 48 year old female presents to this office for follow up and medication management.  Collateral information should be considered reliable. She is more calm this visit and less talkative. Having a lot of family stress with one of her sons. He will be leaving soon for a program.  For now she does not want to change or adjust her medications.   She reports her anxiety today 2/10, and depression at 5/10.  She denies history of mania, no psychosis, no auditory or visual hallucinations.  Denies SI or HI.   Past psychiatric medication trials: Zoloft Wellbutrin Lexapro Cymbalta  Strattera  Individual Medical History/ Review of Systems: Changes? :No   Allergies: Mounjaro  [tirzepatide ], Other, Bupropion, Strawberry extract, and Tomato  Current Medications:  Current Outpatient Medications:    atomoxetine  (STRATTERA ) 60 MG capsule, Take 1 capsule (60 mg total) by mouth daily., Disp: 30 capsule, Rfl: 3   atorvastatin  (LIPITOR) 10 MG tablet, Take 2 tablets (20 mg total) by mouth daily., Disp: 90 tablet, Rfl: 1   Continuous Glucose Sensor (DEXCOM G7 SENSOR) MISC, Use to check glucose continuously, change sensor every 10 days, Disp: 9 each, Rfl: 3   desvenlafaxine  (PRISTIQ ) 50 MG 24 hr tablet, Take 1 tablet (50 mg total) by mouth daily., Disp: 30 tablet, Rfl: 2   DULoxetine  (CYMBALTA ) 60 MG capsule, Take 1 capsule (60 mg total) by mouth daily., Disp: 90 capsule, Rfl: 1   estradiol (VIVELLE-DOT) 0.05 MG/24HR patch, Place 1 patch onto the skin 2 (two) times a week., Disp: , Rfl:    famotidine  (PEPCID ) 20 MG tablet, Take 1 tablet (20 mg total) by mouth at bedtime., Disp: 30 tablet, Rfl:  2   Glucagon  (GVOKE HYPOPEN  1-PACK) 1 MG/0.2ML SOAJ, Inject 1 mg into the skin as needed (low blood sugar with impaired consciousness)., Disp: 0.4 mL, Rfl: 2   Insulin  Disposable Pump (OMNIPOD 5 G7 PODS, GEN 5,) MISC, Inject 1 Device into the skin every 3 (three) days. 1 DEVICE BY DOES NOT APPLY ROUTE EVERY 3 (THREE) DAYS., Disp: 30 each, Rfl: 2   insulin  glargine (LANTUS  SOLOSTAR) 100 UNIT/ML Solostar Pen, Inject 14 Units into the skin daily. In case of pump failure, Disp: 6 mL, Rfl: 1   insulin  lispro (HUMALOG ) 100 UNIT/ML injection, INJECT UPTO 50 UNITS INTO THE SKIN DAILY. USE MAX OF 50 UNITS DAILY VIA INSULIN  PUMP, Disp: 75 mL, Rfl: 3   Multiple Vitamin (MULTIVITAMIN WITH MINERALS) TABS, Take 1 tablet by mouth daily., Disp: , Rfl:    pantoprazole  (PROTONIX ) 40 MG tablet, TAKE 1 TABLET BY MOUTH EVERY DAY, Disp: 90 tablet, Rfl: 0   progesterone (PROMETRIUM) 100 MG capsule, , Disp: , Rfl:    vitamin B-12 (CYANOCOBALAMIN ) 1000 MCG tablet, Take 1,000 mcg by mouth daily., Disp: , Rfl:  Medication Side Effects: none  Family Medical/ Social History: Changes? No  MENTAL HEALTH EXAM:  Last menstrual period 11/10/2018.There is no height or weight on file to calculate BMI.  General Appearance: Casual, Neat, and Well Groomed  Eye Contact:  Good  Speech:  Clear and Coherent  Volume:  Normal  Mood:  NA  Affect:  Congruent  Thought Process:  Coherent  Orientation:  Full (Time, Place, and Person)  Thought Content: Logical   Suicidal Thoughts:  No  Homicidal Thoughts:  No  Memory:  WNL  Judgement:  Good  Insight:  Good  Psychomotor Activity:  Normal  Concentration:  Concentration: Good  Recall:  Good  Fund of Knowledge: Good  Language: Good  Assets:  Desire for Improvement  ADL's:  Intact  Cognition: WNL  Prognosis:  Good    DIAGNOSES:    ICD-10-CM   1. Generalized anxiety disorder  F41.1 desvenlafaxine  (PRISTIQ ) 50 MG 24 hr tablet    2. Major depressive disorder, recurrent episode,  mild (HCC)  F33.0 desvenlafaxine  (PRISTIQ ) 50 MG 24 hr tablet    3. Attention deficit hyperactivity disorder (ADHD), combined type  F90.2 atomoxetine  (STRATTERA ) 60 MG capsule      Receiving Psychotherapy: No    RECOMMENDATIONS:   Greater than 50% of 30 min face to face time with patient was spent on counseling and coordination of care. Discussed her improvement with depression. Talked about family stressors that she is working to improve.  Strattera  continues to work well. She would like to consider a different medication for anxiety and depression at this time.    We agreed today to: Continue  Pristiq  50 mg daily in the am Continue  taking Strattera  60  mg daily To follow-up in 4 weeks to reassess Will report worsening symptoms or side effects promptly Provided emergency contact information Discussed potential metabolic side effects associated with atypical antipsychotics, as well as potential risk for movement side effects. Advised pt to contact office if movement side effects occur.   Reviewed PDMP    Mia DELENA Pizza, NP

## 2024-08-05 ENCOUNTER — Other Ambulatory Visit: Payer: Self-pay | Admitting: Gastroenterology

## 2024-08-09 ENCOUNTER — Encounter: Payer: Self-pay | Admitting: Gastroenterology

## 2024-08-09 ENCOUNTER — Ambulatory Visit: Admitting: Gastroenterology

## 2024-08-09 VITALS — BP 104/68 | HR 59 | Temp 98.4°F | Resp 11 | Ht 65.5 in | Wt 170.0 lb

## 2024-08-09 DIAGNOSIS — Z1211 Encounter for screening for malignant neoplasm of colon: Secondary | ICD-10-CM | POA: Diagnosis not present

## 2024-08-09 DIAGNOSIS — K640 First degree hemorrhoids: Secondary | ICD-10-CM | POA: Diagnosis not present

## 2024-08-09 DIAGNOSIS — Z83719 Family history of colon polyps, unspecified: Secondary | ICD-10-CM

## 2024-08-09 MED ORDER — SODIUM CHLORIDE 0.9 % IV SOLN: 500.0000 mL | Freq: Once | INTRAVENOUS | Status: DC

## 2024-08-09 NOTE — Op Note (Signed)
 [facility] Patient Name: Mia Wilson Procedure Date: 08/09/2024 1:09 PM MRN: [id number] Endoscopist: Lynnie Bring , MD, [id number] Age: 48 Referring MD:  Date of Birth: 1976-08-15 Gender: Female Account #: [id number] Procedure:                Colonoscopy Indications:              Colon cancer screening in patient at increased                            risk: FH advanced polyps (dad) Medicines:                Monitored Anesthesia Care Procedure:                Pre-Anesthesia Assessment:                           - Prior to the procedure, a History and Physical                            was performed, and patient medications and                            allergies were reviewed. The patient's tolerance of                            previous anesthesia was also reviewed. The risks                            and benefits of the procedure and the sedation                            options and risks were discussed with the patient.                            All questions were answered, and informed consent                            was obtained. Prior Anticoagulants: The patient has                            taken no anticoagulant or antiplatelet agents. ASA                            Grade Assessment: II - A patient with mild systemic                            disease. After reviewing the risks and benefits,                            the patient was deemed in satisfactory condition to                            undergo the procedure.  After obtaining informed consent, the colonoscope                            was passed under direct vision. Throughout the                            procedure, the patient's blood pressure, pulse, and                            oxygen saturations were monitored continuously. The                            Olympus Scope CF190 SN [id number] was introduced                            through the anus and advanced to  the the cecum,                            identified by appendiceal orifice and ileocecal                            valve. The colonoscopy was performed without                            difficulty. The patient tolerated the procedure                            well. The quality of the bowel preparation was                            good. Some retained seeds/solid vegetable material                            which clogged the suction channel of the scope. The                            ileocecal valve, appendiceal orifice, and rectum                            were photographed. Scope In: 1:41:24 PM Scope Out: 2:02:38 PM Scope Withdrawal Time: 0 hours 15 minutes 11 seconds  Total Procedure Duration: 0 hours 21 minutes 14 seconds  Findings:                 The colon (entire examined portion) appeared normal.                           Non-bleeding internal hemorrhoids were found during                            retroflexion. The hemorrhoids were small and Grade                            I (internal hemorrhoids that do not prolapse).  Retroflexion in the right colon was performed.                           The exam was otherwise without abnormality on                            direct and retroflexion views. Complications:            No immediate complications. Estimated Blood Loss:     Estimated blood loss: none. Impression:               - The entire examined colon is normal.                           - Non-bleeding internal hemorrhoids.                           - The examination was otherwise normal on direct                            and retroflexion views.                           - No specimens collected. Recommendation:           - Patient has a contact number available for                            emergencies. The signs and symptoms of potential                            delayed complications were discussed with the                             patient. Return to normal activities tomorrow.                            Written discharge instructions were provided to the                            patient.                           - Resume previous diet.                           - Continue present medications.                           - Repeat colonoscopy in 5 years for screening                            purposes.                           - The findings and recommendations were discussed  with the patient's family. Lynnie Bring, MD 08/09/2024 2:07:53 PM This report has been signed electronically.

## 2024-08-09 NOTE — Progress Notes (Signed)
 [facility] History and Physical   Primary Care Physician:  de [place], Quintin PARAS, MD   Reason for Procedure:  Family history of polyps-dad  Plan:    Colonoscopy     HPI: Mia Wilson is a 48 y.o. female    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)    Fibroids    PCOS (polycystic ovarian syndrome)     Past Surgical History:  Procedure Laterality Date   BREAST EXCISIONAL BIOPSY Right    BREAST SURGERY     benign   IR ANGIOGRAM PELVIS SELECTIVE OR SUPRASELECTIVE  11/17/2018   IR ANGIOGRAM PELVIS SELECTIVE OR SUPRASELECTIVE  11/17/2018   IR ANGIOGRAM SELECTIVE EACH ADDITIONAL VESSEL  11/17/2018   IR ANGIOGRAM SELECTIVE EACH ADDITIONAL VESSEL  11/17/2018   IR EMBO TUMOR ORGAN ISCHEMIA INFARCT INC GUIDE ROADMAPPING  11/17/2018   IR RADIOLOGIST EVAL & MGMT  09/14/2018   IR RADIOLOGIST EVAL & MGMT  12/13/2018   IR RADIOLOGIST EVAL & MGMT  06/28/2019   IR US  GUIDE VASC ACCESS RIGHT  11/17/2018   UTERINE FIBROID SURGERY      Prior to Admission medications   Medication Sig Start Date End Date Taking? Authorizing Provider  atomoxetine  (STRATTERA ) 60 MG capsule Take 1 capsule (60 mg total) by mouth daily. 08/03/24  Yes White, Redell LABOR, NP  Continuous Glucose Sensor (DEXCOM G7 SENSOR) MISC Use to check glucose continuously, change sensor every 10 days 05/10/24  Yes Motwani, Komal, MD  desvenlafaxine  (PRISTIQ ) 50 MG 24 hr tablet Take 1 tablet (50 mg total) by mouth daily. 08/03/24  Yes Teresa Redell LABOR, NP  insulin  glargine (LANTUS  SOLOSTAR) 100 UNIT/ML Solostar Pen Inject 14 Units into the skin daily. In case of pump failure 11/18/23 08/09/24 Yes Motwani, Komal, MD  pantoprazole  (PROTONIX ) 40 MG tablet TAKE 1 TABLET BY MOUTH EVERY DAY 04/24/24  Yes McMichael, Bayley M, PA-C  atorvastatin  (LIPITOR) 10 MG tablet Take 2 tablets (20 mg total) by mouth daily. 07/31/24 10/29/24  Motwani, Komal, MD  estradiol (VIVELLE-DOT) 0.05 MG/24HR patch  Place 1 patch onto the skin 2 (two) times a week. 03/28/24   [provider]  famotidine  (PEPCID ) 20 MG tablet TAKE 1 TABLET BY MOUTH EVERYDAY AT BEDTIME Patient not taking: Reported on 08/09/2024 08/05/24   Mollie Nestor HERO, PA-C  Glucagon  (GVOKE HYPOPEN  1-PACK) 1 MG/0.2ML SOAJ Inject 1 mg into the skin as needed (low blood sugar with impaired consciousness). Patient not taking: Reported on 08/09/2024 10/20/23   Motwani, Komal, MD  Insulin  Disposable Pump (OMNIPOD 5 G7 PODS, GEN 5,) MISC Inject 1 Device into the skin every 3 (three) days. 1 DEVICE BY DOES NOT APPLY ROUTE EVERY 3 (THREE) DAYS. Patient not taking: Reported on 08/09/2024 11/11/23   Motwani, Komal, MD  insulin  lispro (HUMALOG ) 100 UNIT/ML injection INJECT UPTO 50 UNITS INTO THE SKIN DAILY. USE MAX OF 50 UNITS DAILY VIA INSULIN  PUMP Patient not taking: Reported on 08/09/2024 12/10/23   Motwani, Komal, MD  Multiple Vitamin (MULTIVITAMIN WITH MINERALS) TABS Take 1 tablet by mouth daily.    [provider]  progesterone (PROMETRIUM) 100 MG capsule  03/16/23   [provider]  vitamin B-12 (CYANOCOBALAMIN ) 1000 MCG tablet Take 1,000 mcg by mouth daily.    [provider]    Current Outpatient Medications  Medication Sig Dispense Refill   atomoxetine  (STRATTERA ) 60 MG capsule Take 1 capsule (60 mg total) by mouth daily.  30 capsule 3   Continuous Glucose Sensor (DEXCOM G7 SENSOR) MISC Use to check glucose continuously, change sensor every 10 days 9 each 3   desvenlafaxine  (PRISTIQ ) 50 MG 24 hr tablet Take 1 tablet (50 mg total) by mouth daily. 30 tablet 2   insulin  glargine (LANTUS  SOLOSTAR) 100 UNIT/ML Solostar Pen Inject 14 Units into the skin daily. In case of pump failure 6 mL 1   pantoprazole  (PROTONIX ) 40 MG tablet TAKE 1 TABLET BY MOUTH EVERY DAY 90 tablet 0   atorvastatin  (LIPITOR) 10 MG tablet Take 2 tablets (20 mg total) by mouth daily. 90 tablet 1   estradiol (VIVELLE-DOT) 0.05 MG/24HR patch Place  1 patch onto the skin 2 (two) times a week.     famotidine  (PEPCID ) 20 MG tablet TAKE 1 TABLET BY MOUTH EVERYDAY AT BEDTIME (Patient not taking: Reported on 08/09/2024) 90 tablet 0   Glucagon  (GVOKE HYPOPEN  1-PACK) 1 MG/0.2ML SOAJ Inject 1 mg into the skin as needed (low blood sugar with impaired consciousness). (Patient not taking: Reported on 08/09/2024) 0.4 mL 2   Insulin  Disposable Pump (OMNIPOD 5 G7 PODS, GEN 5,) MISC Inject 1 Device into the skin every 3 (three) days. 1 DEVICE BY DOES NOT APPLY ROUTE EVERY 3 (THREE) DAYS. (Patient not taking: Reported on 08/09/2024) 30 each 2   insulin  lispro (HUMALOG ) 100 UNIT/ML injection INJECT UPTO 50 UNITS INTO THE SKIN DAILY. USE MAX OF 50 UNITS DAILY VIA INSULIN  PUMP (Patient not taking: Reported on 08/09/2024) 75 mL 3   Multiple Vitamin (MULTIVITAMIN WITH MINERALS) TABS Take 1 tablet by mouth daily.     progesterone (PROMETRIUM) 100 MG capsule      vitamin B-12 (CYANOCOBALAMIN ) 1000 MCG tablet Take 1,000 mcg by mouth daily.     No current facility-administered medications for this visit.    Allergies as of 08/09/2024 - Review Complete 08/09/2024  Allergen Reaction Noted   Mounjaro  [tirzepatide ] Diarrhea and Nausea And Vomiting 02/10/2024   Other Anaphylaxis and Hives 04/08/2016   Bupropion Other (See Comments) 02/08/2024   Strawberry extract Hives 11/08/2013   Tomato Hives 11/08/2013    Family History  Problem Relation Age of Onset   Healthy Mother    Healthy Father    Diabetes Maternal Grandmother    Heart disease Maternal Grandmother    Breast cancer Maternal Grandmother        diagnosed in her 69's   Diabetes Paternal Grandmother    Diabetes Maternal Uncle    Heart disease Paternal Uncle    Hyperlipidemia Other    Hypertension Other    Stroke Other    Cancer Other     Social History   Socioeconomic History   Marital status: Married    Spouse name: Not on file   Number of children: 4   Years of education: Not on file   Highest  education level: Not on file  Occupational History   Occupation: research admn.  Tobacco Use   Smoking status: Never    Passive exposure: Never   Smokeless tobacco: Never  Vaping Use   Vaping status: Never Used  Substance and Sexual Activity   Alcohol use: No   Drug use: No   Sexual activity: Yes  Other Topics Concern   Not on file  Social History Narrative   Living in [place] with husband and two adopted children 15 and 14. Currently on FMLA.  She is not employed at this time. She has been her 2 youngest adopted children at home.  Not involved a lot else here at this time. She has 2 other children also. She walks a little bit but does not do a lot since she has left her with fatigue.   Social Drivers of Corporate investment banker Strain: Not on file  Food Insecurity: No Food Insecurity (03/22/2024)   Hunger Vital Sign    Worried About Running Out of Food in the Last Year: Never true    Ran Out of Food in the Last Year: Never true  Transportation Needs: Not on file  Physical Activity: Not on file  Stress: Stress Concern Present (05/19/2023)   [employer] of Occupational Health - Occupational Stress Questionnaire    Feeling of Stress : Rather much  Social Connections: Unknown (05/19/2023)   Social Connection and Isolation Panel    Frequency of Communication with Friends and Family: More than three times a week    Frequency of Social Gatherings with Friends and Family: More than three times a week    Attends Religious Services: More than 4 times per year    Active Member of [employer] or Organizations: Not on file    Attends Banker Meetings: Not on file    Marital Status: Married  Intimate Partner Violence: Unknown (02/26/2022)   Received from [facility]   HITS    Physically Hurt: Not on file    Insult or Talk Down To: Not on file    Threaten Physical Harm: Not on file    Scream or Curse: Not on file    Review of Systems: Positive for none All other  review of systems negative except as mentioned in the HPI.  Physical Exam: Vital signs in last 24 hours: @VSRANGES @   General:   Alert,  Well-developed, well-nourished, pleasant and cooperative in NAD Lungs:  Clear throughout to auscultation.   Heart:  Regular rate and rhythm; no murmurs, clicks, rubs,  or gallops. Abdomen:  Soft, nontender and nondistended. Normal bowel sounds.   Neuro/Psych:  Alert and cooperative. Normal mood and affect. A and O x 3    No significant changes were identified.  The patient continues to be an appropriate candidate for the planned procedure and anesthesia.   Anselm Bring, MD. [facility] 08/09/2024 4:03 PM@

## 2024-08-09 NOTE — Progress Notes (Signed)
 Sedate, gd SR, tolerated procedure well, VSS, report to RN

## 2024-08-09 NOTE — Progress Notes (Signed)
 Pt's states no medical or surgical changes since previsit or office visit.

## 2024-08-09 NOTE — Patient Instructions (Signed)
 Resume previous diet. Continue present medications. Repeat colonoscopy in 5 years for screening.  YOU HAD AN ENDOSCOPIC PROCEDURE TODAY AT THE [facility]:   Refer to the procedure report that was given to you for any specific questions about what was found during the examination.  If the procedure report does not answer your questions, please call your gastroenterologist to clarify.  If you requested that your care partner not be given the details of your procedure findings, then the procedure report has been included in a sealed envelope for you to review at your convenience later.  YOU SHOULD EXPECT: Some feelings of bloating in the abdomen. Passage of more gas than usual.  Walking can help get rid of the air that was put into your GI tract during the procedure and reduce the bloating. If you had a lower endoscopy (such as a colonoscopy or flexible sigmoidoscopy) you may notice spotting of blood in your stool or on the toilet paper. If you underwent a bowel prep for your procedure, you may not have a normal bowel movement for a few days.  Please Note:  You might notice some irritation and congestion in your nose or some drainage.  This is from the oxygen used during your procedure.  There is no need for concern and it should clear up in a day or so.  SYMPTOMS TO REPORT IMMEDIATELY:  Following lower endoscopy (colonoscopy or flexible sigmoidoscopy):  Excessive amounts of blood in the stool  Significant tenderness or worsening of abdominal pains  Swelling of the abdomen that is new, acute  Fever of 100F or higher  For urgent or emergent issues, a gastroenterologist can be reached at any hour by calling [phone]. Do not use MyChart messaging for urgent concerns.    DIET:  We do recommend a small meal at first, but then you may proceed to your regular diet.  Drink plenty of fluids but you should avoid alcoholic beverages for 24 hours.  ACTIVITY:  You should plan to take it  easy for the rest of today and you should NOT DRIVE or use heavy machinery until tomorrow (because of the sedation medicines used during the test).    FOLLOW UP: Our staff will call the number listed on your records the next business day following your procedure.  We will call around 7:15- 8:00 am to check on you and address any questions or concerns that you may have regarding the information given to you following your procedure. If we do not reach you, we will leave a message.     If any biopsies were taken you will be contacted by phone or by letter within the next 1-3 weeks.  Please call us  at [phone] if you have not heard about the biopsies in 3 weeks.    SIGNATURES/CONFIDENTIALITY: You and/or your care partner have signed paperwork which will be entered into your electronic medical record.  These signatures attest to the fact that that the information above on your After Visit Summary has been reviewed and is understood.  Full responsibility of the confidentiality of this discharge information lies with you and/or your care-partner.

## 2024-08-10 ENCOUNTER — Other Ambulatory Visit: Payer: Self-pay | Admitting: "Endocrinology

## 2024-08-10 ENCOUNTER — Telehealth: Payer: Self-pay | Admitting: *Deleted

## 2024-08-10 DIAGNOSIS — E1165 Type 2 diabetes mellitus with hyperglycemia: Secondary | ICD-10-CM

## 2024-08-10 NOTE — Telephone Encounter (Signed)
  Follow up Call-     08/09/2024    1:07 PM  Call back number  Post procedure Call Back phone  # [phone]  Permission to leave phone message Yes     Patient questions:  Do you have a fever, pain , or abdominal swelling? No. Pain Score  0 *  Have you tolerated food without any problems? Yes.    Have you been able to return to your normal activities? Yes.    Do you have any questions about your discharge instructions: Diet   No. Medications  No. Follow up visit  No.  Do you have questions or concerns about your Care? No.  Actions: * If pain score is 4 or above: No action needed, pain <4.

## 2024-08-10 NOTE — Telephone Encounter (Signed)
 Requested Prescriptions   Pending Prescriptions Disp Refills   Insulin  Disposable Pump (OMNIPOD 5 DEXG7G6 PODS GEN 5) MISC [Pharmacy Med Name: OMNIPOD 5 G6/G7 PODS (GEN 5)] 30 each 2    Sig: CHANGE POD EVERY 3 (THREE) DAYS.

## 2024-08-11 ENCOUNTER — Ambulatory Visit (INDEPENDENT_AMBULATORY_CARE_PROVIDER_SITE_OTHER): Admitting: Professional Counselor

## 2024-08-11 ENCOUNTER — Encounter: Payer: Self-pay | Admitting: Professional Counselor

## 2024-08-11 DIAGNOSIS — F330 Major depressive disorder, recurrent, mild: Secondary | ICD-10-CM

## 2024-08-11 DIAGNOSIS — F411 Generalized anxiety disorder: Secondary | ICD-10-CM | POA: Diagnosis not present

## 2024-08-11 NOTE — Progress Notes (Signed)
      [facility] Counselor/Therapist Progress Note  Patient ID: AURORA RODY, MRN: [id number],    Date: 08/11/2024  Time Spent: 4:15 PM to 5:16 PM  Treatment Type: Individual Therapy  Reported Symptoms: Worries, frustration, fatigue, stress, parenting concerns, intermittent low mood, anxiousness  Mental Status Exam:  Appearance:   Casual     Behavior:  Appropriate, Sharing, and Motivated  Motor:  Normal  Speech/Language:   Clear and Coherent and Normal Rate  Affect:  Appropriate and Congruent  Mood:  normal  Thought process:  normal  Thought content:    WNL  Sensory/Perceptual disturbances:    WNL  Orientation:  oriented to person, place, time/date, and situation  Attention:  Good  Concentration:  Good  Memory:  WNL  Fund of knowledge:   Good  Insight:    Good  Judgment:   Good  Impulse Control:  Good   Risk Assessment: Danger to Self:  No Self-injurious Behavior: No Danger to Others: No Duty to Warn:no Physical Aggression / Violence:No  Access to Firearms a concern: No  Gang Involvement:No   Subjective: Patient presented to session to address concerns of anxiety and depression.  She reported mixed progress at this time.  She reported ongoing concern, worry and frustration regarding one of her sons and challenges that he is facing and that family is facing as a result and home life.  She processed experience of his exacerbated behavior concerns, and identified her efforts to discipline and to step away at the same time.  Counselor actively listened and affirmed patient feelings and experience, and discussed options for increased care with patient.  Counselor and patient discussed [employer], undisciplined youth petitions, and other options to increase safety and support for son and family.  Patient identified worry for her husband's health as relates the stress, and voiced looking forward to a cruise they have planned in the new year.  Counselor and patient  discussed patient increased engagement with school staff at this time, and increased self-care.  Interventions: Assertiveness/Communication, Solution-Oriented/Positive Psychology, Humanistic/Existential, Insight-Oriented, Pharmacist, hospital, Resourcing  Diagnosis:   ICD-10-CM   1. Generalized anxiety disorder  F41.1     2. Major depressive disorder, recurrent episode, mild (HCC)  F33.0       Plan: Patient is scheduled for follow-up; continue process work and developing coping skills.  Patient STG between sessions to continue to resource professional help and observe safety plan in family life circumstances, prioritize self-care.  [name], [facility]

## 2024-08-25 ENCOUNTER — Ambulatory Visit: Admitting: Professional Counselor

## 2024-08-29 ENCOUNTER — Encounter: Attending: Gastroenterology | Admitting: Dietician

## 2024-08-29 ENCOUNTER — Encounter: Payer: Self-pay | Admitting: Dietician

## 2024-08-29 DIAGNOSIS — E139 Other specified diabetes mellitus without complications: Secondary | ICD-10-CM | POA: Diagnosis not present

## 2024-08-29 NOTE — Progress Notes (Signed)
 Diabetes Self-Management Education  Visit Type: Follow-up  Appt. Start Time: 1115  Appt. End Time: 1145  08/29/2024  [name], identified by name and date of birth, is a 48 y.o. female with a diagnosis of Diabetes: type 1.5  .   ASSESSMENT  This is a virtual visit Patient Location: Home Provider Location: Office   History includes: diabetes 1.5, GERD Labs noted: 07/31/24 A1c 8.5% Medications include: lantus , humalog ,  Supplements: vitamin b12, MVI Allergies/intolerances: tomato, strawberry, eggplant, red apples, peaches. (All may cause hives) Omnipod & Dexcom G6   A1c up from 7.3% to 8.5%. Pt reports since previous visit she has had a lot happen likely causing A1c to increase. Pt states toe is still healing but she is able to walk now. Pt reports she had some deaths in her family, a change in depression medication, and also had insurance issues with her pump and CGM and had to go 2 weeks without CGM and doing injections. Pt reports during all this it was hard to keep her blood glucose controlled.   Pt states during the grieving time of her family members she was also having some household issues with son & husband, and these stressors also made it hard to keep on track with eating and exercise. Pt states she felt while she was grieving that she either ate too much or skipped meals.    Pt states her husband went vegetarian which has made household meals easier to manage.   Assessment and Continuation of Previous Goals Established by Patient:   Goal 1: get 10 minutes of exercise daily. - goal in progress, continue!   Goal 2: make sure to eat 3 meals per day. - goal in progress, continue!    Goal 3: plan what you're going to eat the next day the night before. - goal in progress, continue.   Last menstrual period 11/10/2018. There is no height or weight on file to calculate BMI.   Diabetes Self-Management Education - 08/29/24 1114       Visit Information   Visit  Type Follow-up      Psychosocial Assessment   Patient Belief/Attitude about Diabetes Motivated to manage diabetes    What is the hardest part about your diabetes right now, causing you the most concern, or is the most worrisome to you about your diabetes?   Making healty food and beverage choices    Self-care barriers None    Self-management support Doctor's office    Other persons present Patient    Patient Concerns Nutrition/Meal planning    Special Needs None    Preferred Learning Style No preference indicated    Learning Readiness Ready      Pre-Education Assessment   Patient understands the diabetes disease and treatment process. Needs Review    Patient understands incorporating nutritional management into lifestyle. Needs Review    Patient undertands incorporating physical activity into lifestyle. Needs Review    Patient understands using medications safely. Needs Review    Patient understands monitoring blood glucose, interpreting and using results Needs Review    Patient understands prevention, detection, and treatment of acute complications. Needs Review    Patient understands prevention, detection, and treatment of chronic complications. Needs Review    Patient understands how to develop strategies to address psychosocial issues. Needs Review    Patient understands how to develop strategies to promote health/change behavior. Needs Review      Complications   Last HgB A1C per patient/outside source 8.5 %  How often do you check your blood sugar? > 4 times/day    Fasting Blood glucose range (mg/dL) 29-870      Dietary Intake   Breakfast none    Snack (morning) cashew clusters    Lunch none    Snack (afternoon) cashew clusters    Dinner bean tacos with veggies    Snack (evening) none    Beverage(s) 40 oz water, 16 oz green tea      Activity / Exercise   Activity / Exercise Type Light (walking / raking leaves)    How many days per week do you exercise? 5    How many  minutes per day do you exercise? 15    Total minutes per week of exercise 75      Patient Education   Previous Diabetes Education No    Disease Pathophysiology Explored patient's options for treatment of their diabetes    Healthy Eating Role of diet in the treatment of diabetes and the relationship between the three main macronutrients and blood glucose level;Plate Method;Meal timing in regards to the patients' current diabetes medication.;Meal options for control of blood glucose level and chronic complications.;Reviewed blood glucose goals for pre and post meals and how to evaluate the patients' food intake on their blood glucose level.    Being Active Helped patient identify appropriate exercises in relation to his/her diabetes, diabetes complications and other health issue.    Medications Reviewed patients medication for diabetes, action, purpose, timing of dose and side effects.    Monitoring Purpose and frequency of SMBG.;Identified appropriate SMBG and/or A1C goals.    Chronic complications Relationship between chronic complications and blood glucose control;Identified and discussed with patient  current chronic complications    Diabetes Stress and Support Identified and addressed patients feelings and concerns about diabetes;Worked with patient to identify barriers to care and solutions    Lifestyle and Health Coping Lifestyle issues that need to be addressed for better diabetes care      Individualized Goals (developed by patient)   Nutrition General guidelines for healthy choices and portions discussed    Physical Activity Exercise 3-5 times per week;30 minutes per day    Medications take my medication as prescribed    Monitoring  Test my blood glucose as discussed;Consistenly use CGM    Problem Solving Eating Pattern    Reducing Risk examine blood glucose patterns;do foot checks daily;treat hypoglycemia with 15 grams of carbs if blood glucose less than 70mg /dL    Health Coping Ask for  help with psychological, social, or emotional issues      Patient Self-Evaluation of Goals - Patient rates self as meeting previously set goals (% of time)   Nutrition 50 - 75 % (half of the time)    Physical Activity 50 - 75 % (half of the time)    Medications >75% (most of the time)    Monitoring >75% (most of the time)    Problem Solving and behavior change strategies  50 - 75 % (half of the time)    Reducing Risk (treating acute and chronic complications) 50 - 75 % (half of the time)    Health Coping 50 - 75 % (half of the time)      Post-Education Assessment   Patient understands the diabetes disease and treatment process. Comprehends key points    Patient understands incorporating nutritional management into lifestyle. Comprehends key points    Patient undertands incorporating physical activity into lifestyle. Comprehends key points  Patient understands using medications safely. Comphrehends key points    Patient understands monitoring blood glucose, interpreting and using results Comprehends key points    Patient understands prevention, detection, and treatment of acute complications. Comprehends key points    Patient understands prevention, detection, and treatment of chronic complications. Comprehends key points    Patient understands how to develop strategies to address psychosocial issues. Comprehends key points    Patient understands how to develop strategies to promote health/change behavior. Comprehends key points      Outcomes   Expected Outcomes Demonstrated interest in learning. Expect positive outcomes    Future DMSE 3-4 months    Program Status Not Completed      Subsequent Visit   Since your last visit have you continued or begun to take your medications as prescribed? Yes    Since your last visit, are you checking your blood glucose at least once a day? Yes          Individualized Plan for Diabetes Self-Management Training:   Learning Objective:  Patient  will have a greater understanding of diabetes self-management. Patient education plan is to attend individual and/or group sessions per assessed needs and concerns.   Plan:   There are no Patient Instructions on file for this visit. (See goals under assessment)  Expected Outcomes:  Demonstrated interest in learning. Expect positive outcomes  Education material provided: none (virtual)  If problems or questions, patient to contact team via:  Phone  Future DSME appointment: 3-4 months

## 2024-08-31 ENCOUNTER — Other Ambulatory Visit

## 2024-09-07 ENCOUNTER — Other Ambulatory Visit (HOSPITAL_BASED_OUTPATIENT_CLINIC_OR_DEPARTMENT_OTHER): Payer: Self-pay | Admitting: *Deleted

## 2024-09-07 DIAGNOSIS — Z0000 Encounter for general adult medical examination without abnormal findings: Secondary | ICD-10-CM

## 2024-09-08 ENCOUNTER — Ambulatory Visit (INDEPENDENT_AMBULATORY_CARE_PROVIDER_SITE_OTHER): Admitting: Professional Counselor

## 2024-09-08 DIAGNOSIS — F330 Major depressive disorder, recurrent, mild: Secondary | ICD-10-CM

## 2024-09-08 DIAGNOSIS — F411 Generalized anxiety disorder: Secondary | ICD-10-CM

## 2024-09-08 LAB — HEMOGLOBIN A1C
Est. average glucose Bld gHb Est-mCnc: 186 mg/dL
Hgb A1c MFr Bld: 8.1 % — ABNORMAL HIGH (ref 4.8–5.6)

## 2024-09-08 LAB — COMPREHENSIVE METABOLIC PANEL WITH GFR
ALT: 18 IU/L (ref 0–32)
AST: 24 IU/L (ref 0–40)
Albumin: 4.4 g/dL (ref 3.9–4.9)
Alkaline Phosphatase: 117 IU/L — ABNORMAL HIGH (ref 41–116)
BUN/Creatinine Ratio: 19 (ref 9–23)
BUN: 15 mg/dL (ref 6–24)
Bilirubin Total: 0.4 mg/dL (ref 0.0–1.2)
CO2: 24 mmol/L (ref 20–29)
Calcium: 9.9 mg/dL (ref 8.7–10.2)
Chloride: 102 mmol/L (ref 96–106)
Creatinine, Ser: 0.79 mg/dL (ref 0.57–1.00)
Globulin, Total: 2.4 g/dL (ref 1.5–4.5)
Glucose: 101 mg/dL — ABNORMAL HIGH (ref 70–99)
Potassium: 4.9 mmol/L (ref 3.5–5.2)
Sodium: 143 mmol/L (ref 134–144)
Total Protein: 6.8 g/dL (ref 6.0–8.5)
eGFR: 91 mL/min/1.73 (ref >59)

## 2024-09-08 LAB — CBC WITH DIFFERENTIAL/PLATELET
Basophils Absolute: 0 x10E3/uL (ref 0.0–0.2)
Basos: 1 %
EOS (ABSOLUTE): 0.1 x10E3/uL (ref 0.0–0.4)
Eos: 1 %
Hematocrit: 40.3 % (ref 34.0–46.6)
Hemoglobin: 12.6 g/dL (ref 11.1–15.9)
Immature Grans (Abs): 0 x10E3/uL (ref 0.0–0.1)
Immature Granulocytes: 0 %
Lymphocytes Absolute: 2.3 x10E3/uL (ref 0.7–3.1)
Lymphs: 59 %
MCH: 26.7 pg (ref 26.6–33.0)
MCHC: 31.3 g/dL — ABNORMAL LOW (ref 31.5–35.7)
MCV: 85 fL (ref 79–97)
Monocytes Absolute: 0.4 x10E3/uL (ref 0.1–0.9)
Monocytes: 10 %
Neutrophils Absolute: 1.2 x10E3/uL — ABNORMAL LOW (ref 1.4–7.0)
Neutrophils: 29 %
Platelets: 330 x10E3/uL (ref 150–450)
RBC: 4.72 x10E6/uL (ref 3.77–5.28)
RDW: 13 % (ref 11.7–15.4)
WBC: 4 x10E3/uL (ref 3.4–10.8)

## 2024-09-08 LAB — LIPID PANEL
Chol/HDL Ratio: 2.4 ratio (ref 0.0–4.4)
Cholesterol, Total: 227 mg/dL — ABNORMAL HIGH (ref 100–199)
HDL: 94 mg/dL (ref >39)
LDL Chol Calc (NIH): 125 mg/dL — ABNORMAL HIGH (ref 0–99)
Triglycerides: 48 mg/dL (ref 0–149)
VLDL Cholesterol Cal: 8 mg/dL (ref 5–40)

## 2024-09-08 LAB — TSH RFX ON ABNORMAL TO FREE T4: TSH: 1.86 u[IU]/mL (ref 0.450–4.500)

## 2024-09-08 NOTE — Progress Notes (Signed)
      [facility] Counselor/Therapist Progress Note  [name], MRN: [id number],    Date: 09/08/2024  Time Spent: 3:14 PM to 4:15 PM  Treatment Type: Individual Therapy  Reported Symptoms: Grief/loss, worries, fatigue, health concerns, interpersonal concerns, stress, self-care concerns, phase of life concerns  Mental Status Exam:  Appearance:   Casual     Behavior:  Appropriate and Sharing  Motor:  Normal  Speech/Language:   Clear and Coherent and Normal Rate  Affect:  Appropriate and Congruent  Mood:  normal  Thought process:  normal  Thought content:    WNL  Sensory/Perceptual disturbances:    WNL  Orientation:  oriented to person, place, time/date, and situation  Attention:  Good  Concentration:  Good  Memory:  WNL  Fund of knowledge:   Good  Insight:    Good  Judgment:   Good  Impulse Control:  Good   Risk Assessment: Danger to Self:  No Self-injurious Behavior: No Danger to Others: No Duty to Warn:no Physical Aggression / Violence:No  Access to Firearms a concern: No  Gang Involvement:No   Subjective: Patient presented to session to address concerns of anxiety and depression.  She reported mixed progress at this time.  She reported current stabilization with her sons, circumstance which alleviates good measure of stress.  She processed experience of changes with youngest son, and extreme exhaustion she felt after he left for therapeutic program.  She reported him to have returned home.  She reported husband as taking more initiative and her needing him to; she processed experience of overwhelm and stress on a continuum.  She identified self-care regimen as in the deficit at this time.  She reported her A1c numbers to be better, for which she is glad.  She continued to process experience of grief and loss, and celebrating life of her family.  Counselor actively listened, affirmed patient feelings and experience, and helped to reinforce patient coping  skills and strengths, including prioritization of self-care.  Interventions: Solution-Oriented/Positive Psychology, Humanistic/Existential, Insight-Oriented, and Resourcing  Diagnosis:   ICD-10-CM   1. Generalized anxiety disorder  F41.1     2. Major depressive disorder, recurrent episode, mild  F33.0       Plan: Patient is scheduled for follow-up; continue process work and developing coping skills.  Patient short-term goal between sessions to prioritize rest and self-care, foundational aspects to her wellbeing.  Continue to resource support from husband and other family members toward alleviating stress, over functioning in family role, and sense of overwhelm.  [name], [facility]

## 2024-09-10 ENCOUNTER — Telehealth (HOSPITAL_BASED_OUTPATIENT_CLINIC_OR_DEPARTMENT_OTHER): Payer: Self-pay | Admitting: Family Medicine

## 2024-09-10 NOTE — Telephone Encounter (Signed)
 Copied from CRM #[id number]. Topic: Clinical - Lab/Test Results >> Sep 08, 2024 10:53 AM Delon DASEN wrote: Reason for CRM: patient has questions about labs and possible prescriptions- [phone]

## 2024-09-11 ENCOUNTER — Encounter (HOSPITAL_BASED_OUTPATIENT_CLINIC_OR_DEPARTMENT_OTHER): Admitting: Family Medicine

## 2024-09-11 NOTE — Telephone Encounter (Signed)
 Called and spoke with pt who has questions about recent labwork. Pt was originally scheduled for physical 10/20 but it had to be rescheduled due to provider being out of the office.  Due to what pt saw via her mychart about results, she wants to know if a prescription might need to be sent in to help with anemia. Pt is currently taking OTC iron until she hears from us .  Routing to Dr. De Peru for review.

## 2024-09-12 ENCOUNTER — Ambulatory Visit (HOSPITAL_BASED_OUTPATIENT_CLINIC_OR_DEPARTMENT_OTHER): Payer: Self-pay | Admitting: Family Medicine

## 2024-09-20 ENCOUNTER — Encounter: Payer: Self-pay | Admitting: Professional Counselor

## 2024-09-20 ENCOUNTER — Ambulatory Visit: Admitting: Professional Counselor

## 2024-09-20 DIAGNOSIS — F330 Major depressive disorder, recurrent, mild: Secondary | ICD-10-CM

## 2024-09-20 DIAGNOSIS — F411 Generalized anxiety disorder: Secondary | ICD-10-CM | POA: Diagnosis not present

## 2024-09-20 NOTE — Progress Notes (Signed)
      [facility] Counselor/Therapist Progress Note  [name], MRN: [id number],    Date: 09/20/2024  Time Spent: 1:02 PM to 2:00 PM  Treatment Type: Individual Therapy  Reported Symptoms: Fatigue, worries, anxiousness, stress, parenting concerns, intermittent low mood and anhedonia  Mental Status Exam:  Appearance:   Casual     Behavior:  Appropriate, Sharing, and Motivated  Motor:  Normal  Speech/Language:   Clear and Coherent and Normal Rate  Affect:  Appropriate and Congruent  Mood:  normal  Thought process:  normal  Thought content:    WNL  Sensory/Perceptual disturbances:    WNL  Orientation:  oriented to person, place, time/date, and situation  Attention:  Good  Concentration:  Good  Memory:  WNL  Fund of knowledge:   Good  Insight:    Good  Judgment:   Good  Impulse Control:  Good   Risk Assessment: Danger to Self:  No Self-injurious Behavior: No Danger to Others: No Duty to Warn:no Physical Aggression / Violence:No  Access to Firearms a concern: No  Gang Involvement:No   Subjective: Patient presented to session to address concerns of anxiety and depression.  She reported mixed progress at this time.  Patient celebrated circumstance of one of her sons getting into the school of his choice, and counselor celebrated positive development with patient.  Patient identified ongoing challenges with another son and processed experience of his returned home after being in program that did not work out.  Counselor actively listened, affirmed patient feelings and experience, and help to discuss safety concerns with patient, and reinforced patient identification of husband taking more responsibility and being more present around parenting challenges at this time.  Counselor help patient facilitate insight into need to step back from over functioning and family sphere, and encouraged couples counseling for patient and spouse.  Interventions:  Solution-Oriented/Positive Psychology, Humanistic/Existential, and Insight-Oriented  Diagnosis:   ICD-10-CM   1. Generalized anxiety disorder  F41.1     2. Major depressive disorder, recurrent episode, mild  F33.0       Plan: Patient is scheduled for follow-up; continue process work and developing coping skills.  Short-term goal between sessions for patient to prioritize self-care, limiting over functioning tendencies, resource support system, consider couples counseling.  Mia Wilson, [facility]

## 2024-09-21 ENCOUNTER — Ambulatory Visit (HOSPITAL_BASED_OUTPATIENT_CLINIC_OR_DEPARTMENT_OTHER): Admitting: Family Medicine

## 2024-09-21 VITALS — BP 145/75 | HR 71 | Ht 65.0 in | Wt 173.0 lb

## 2024-09-21 DIAGNOSIS — R0683 Snoring: Secondary | ICD-10-CM | POA: Diagnosis not present

## 2024-09-21 DIAGNOSIS — Z789 Other specified health status: Secondary | ICD-10-CM | POA: Diagnosis not present

## 2024-09-21 DIAGNOSIS — Z0000 Encounter for general adult medical examination without abnormal findings: Secondary | ICD-10-CM

## 2024-09-21 DIAGNOSIS — Z23 Encounter for immunization: Secondary | ICD-10-CM | POA: Diagnosis not present

## 2024-09-21 NOTE — Progress Notes (Signed)
 Subjective:    CC: Annual Physical Exam  HPI:  Mia Wilson is a 48 y.o. presenting for annual physical  I reviewed the past medical history, family history, social history, surgical history, and allergies today and no changes were needed.  Please see the problem list section below in epic for further details.  Past Medical History: 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)    Fibroids    PCOS (polycystic ovarian syndrome)    Past Surgical History: Past Surgical History:  Procedure Laterality Date   BREAST EXCISIONAL BIOPSY Right    BREAST SURGERY     benign   IR ANGIOGRAM PELVIS SELECTIVE OR SUPRASELECTIVE  11/17/2018   IR ANGIOGRAM PELVIS SELECTIVE OR SUPRASELECTIVE  11/17/2018   IR ANGIOGRAM SELECTIVE EACH ADDITIONAL VESSEL  11/17/2018   IR ANGIOGRAM SELECTIVE EACH ADDITIONAL VESSEL  11/17/2018   IR EMBO TUMOR ORGAN ISCHEMIA INFARCT INC GUIDE ROADMAPPING  11/17/2018   IR RADIOLOGIST EVAL & MGMT  09/14/2018   IR RADIOLOGIST EVAL & MGMT  12/13/2018   IR RADIOLOGIST EVAL & MGMT  06/28/2019   IR US  GUIDE VASC ACCESS RIGHT  11/17/2018   UTERINE FIBROID SURGERY     Social History: Social History   Socioeconomic History   Marital status: Married    Spouse name: Not on file   Number of children: 4   Years of education: Not on file   Highest education level: Master's degree (e.g., MA, MS, MEng, MEd, MSW, MBA)  Occupational History   Occupation: research admn.  Tobacco Use   Smoking status: Never    Passive exposure: Never   Smokeless tobacco: Never  Vaping Use   Vaping status: Never Used  Substance and Sexual Activity   Alcohol use: No   Drug use: No   Sexual activity: Yes  Other Topics Concern   Not on file  Social History Narrative   Living in [place] with husband and two adopted children 15 and 14. Currently on FMLA.  She is not employed at this time. She has been her 2 youngest adopted children at home. Not  involved a lot else here at this time. She has 2 other children also. She walks a little bit but does not do a lot since she has left her with fatigue.   Social Drivers of Corporate Investment Banker Strain: Medium Risk (09/21/2024)   Overall Financial Resource Strain (CARDIA)    Difficulty of Paying Living Expenses: Somewhat hard  Food Insecurity: No Food Insecurity (09/21/2024)   Hunger Vital Sign    Worried About Running Out of Food in the Last Year: Never true    Ran Out of Food in the Last Year: Never true  Transportation Needs: No Transportation Needs (09/21/2024)   PRAPARE - Administrator, Civil Service (Medical): No    Lack of Transportation (Non-Medical): No  Physical Activity: Insufficiently Active (09/21/2024)   Exercise Vital Sign    Days of Exercise per Week: 1 day    Minutes of Exercise per Session: 20 min  Stress: Stress Concern Present (09/21/2024)   [employer] of Occupational Health - Occupational Stress Questionnaire    Feeling of Stress: Very much  Social Connections: Socially Integrated (09/21/2024)   Social Connection and Isolation Panel    Frequency of Communication with Friends and Family: More than three times a week    Frequency of Social Gatherings with Friends and Family: Once  a week    Attends Religious Services: More than 4 times per year    Active Member of Clubs or Organizations: Yes    Attends Engineer, Structural: More than 4 times per year    Marital Status: Married   Family History: Family History  Problem Relation Age of Onset   Healthy Mother    Healthy Father    Diabetes Maternal Grandmother    Heart disease Maternal Grandmother    Breast cancer Maternal Grandmother        diagnosed in her 57's   Diabetes Paternal Grandmother    Diabetes Maternal Uncle    Heart disease Paternal Uncle    Hyperlipidemia Other    Hypertension Other    Stroke Other    Cancer Other    Allergies: Allergies  Allergen  Reactions   Mounjaro  [Tirzepatide ] Diarrhea and Nausea And Vomiting    Lost 15 pounds in 1 week.  Required IV fluids   Other Anaphylaxis and Hives    Peaches , tomatoes, strawberry   Bupropion Other (See Comments)    Other Reaction(s): doesn't tolerate doses over 300mg ,tremors   Strawberry Extract Hives    Facial hives and mouth hives   Tomato Hives    Facial hives and mouth hives   Medications: See med rec.  Review of Systems: No headache, visual changes, nausea, vomiting, diarrhea, constipation, dizziness, abdominal pain, skin rash, fevers, chills, night sweats, swollen lymph nodes, weight loss, chest pain, body aches, joint swelling, muscle aches, shortness of breath, mood changes, visual or auditory hallucinations.  Objective:    BP (!) 145/75 (BP Location: Left Arm, Patient Position: Sitting, Cuff Size: Normal)   Pulse 71   Ht 5' 5 (1.651 m)   Wt 173 lb (78.5 kg)   LMP 11/10/2018 (Approximate)   SpO2 100%   BMI 28.79 kg/m   General: Well Developed, well nourished, and in no acute distress.  Neuro: Alert and oriented x3, extra-ocular muscles intact, sensation grossly intact. Cranial nerves II through XII are intact, motor, sensory, and coordinative functions are all intact. HEENT: Normocephalic, atraumatic, pupils equal round reactive to light, neck supple, no masses, no lymphadenopathy, thyroid  nonpalpable. Oropharynx, nasopharynx, external ear canals are unremarkable. Skin: Warm and dry, no rashes noted.  Cardiac: Regular rate and rhythm, no murmurs rubs or gallops.  Respiratory: Clear to auscultation bilaterally. Not using accessory muscles, speaking in full sentences. Abdominal: Soft, nontender, nondistended, positive bowel sounds, no masses, no organomegaly. Musculoskeletal: Shoulder, elbow, wrist, hip, knee, ankle stable, and with full range of motion.  Impression and Recommendations:    Wellness examination Assessment & Plan: Routine HCM labs reviewed. HCM  reviewed/discussed. Anticipatory guidance regarding healthy weight, lifestyle and choices given. Recommend healthy diet.  Recommend approximately 150 minutes/week of moderate intensity exercise Recommend regular dental and vision exams Always use seatbelt/lap and shoulder restraints Recommend using smoke alarms and checking batteries at least twice a year Recommend using sunscreen when outside Discussed colon cancer screening recommendations, options.  Patient is UTD Discussed recommendations for shingles vaccine.  Patient will consider Discussed immunization recommendations   Immunization due -     Flu vaccine trivalent PF, 6mos and older(Flulaval,Afluria,Fluarix,Fluzone) -     Pneumococcal Conjugate PCV21(Capvaxive)  Snoring Assessment & Plan: Patient notes that she has been having some issues with fatigue.  Feels that when she wakes up that she will not be fully rested.  Has ongoing fatigue throughout the day.  She does note that at night that she will  be snoring at times, will wake up at times through the night as a result. We did discuss considerations related to observed fatigue.  Recent labs were reassuring with no thyroid  dysfunction or anemia noted.  A1c has been above goal, indicating that blood sugars are somewhat elevated which could be contributing to symptoms.  Additionally, could be due to suboptimally controlled anxiety or depression, however she feels that general symptoms related to these conditions are fairly well-controlled. Ultimately, do feel that further evaluation for potential underlying sleep apnea would be reasonable, patient in agreement, referral to sleep medicine placed today  Orders: -     Ambulatory referral to Neurology  Vegetarian diet -     Vitamin B12  Return in about 1 year (around 09/21/2025) for CPE.   ___________________________________________ Kaige Whistler de [place], MD, ABFM, CAQSM Primary Care and Sports Medicine [facility]

## 2024-09-21 NOTE — Assessment & Plan Note (Addendum)
 Routine HCM labs reviewed. HCM reviewed/discussed. Anticipatory guidance regarding healthy weight, lifestyle and choices given. Recommend healthy diet.  Recommend approximately 150 minutes/week of moderate intensity exercise Recommend regular dental and vision exams Always use seatbelt/lap and shoulder restraints Recommend using smoke alarms and checking batteries at least twice a year Recommend using sunscreen when outside Discussed colon cancer screening recommendations, options.  Patient is UTD Discussed recommendations for shingles vaccine.  Patient will consider Discussed immunization recommendations

## 2024-09-21 NOTE — Assessment & Plan Note (Signed)
 Patient notes that she has been having some issues with fatigue.  Feels that when she wakes up that she will not be fully rested.  Has ongoing fatigue throughout the day.  She does note that at night that she will be snoring at times, will wake up at times through the night as a result. We did discuss considerations related to observed fatigue.  Recent labs were reassuring with no thyroid  dysfunction or anemia noted.  A1c has been above goal, indicating that blood sugars are somewhat elevated which could be contributing to symptoms.  Additionally, could be due to suboptimally controlled anxiety or depression, however she feels that general symptoms related to these conditions are fairly well-controlled. Ultimately, do feel that further evaluation for potential underlying sleep apnea would be reasonable, patient in agreement, referral to sleep medicine placed today

## 2024-09-22 ENCOUNTER — Ambulatory Visit: Admitting: Professional Counselor

## 2024-09-22 ENCOUNTER — Ambulatory Visit (HOSPITAL_BASED_OUTPATIENT_CLINIC_OR_DEPARTMENT_OTHER): Payer: Self-pay | Admitting: Family Medicine

## 2024-09-22 LAB — VITAMIN B12: Vitamin B-12: 1008 pg/mL (ref 232–1245)

## 2024-09-26 ENCOUNTER — Encounter (HOSPITAL_BASED_OUTPATIENT_CLINIC_OR_DEPARTMENT_OTHER): Payer: Self-pay | Admitting: Family Medicine

## 2024-09-26 DIAGNOSIS — L409 Psoriasis, unspecified: Secondary | ICD-10-CM

## 2024-10-02 ENCOUNTER — Other Ambulatory Visit

## 2024-10-02 DIAGNOSIS — Z006 Encounter for examination for normal comparison and control in clinical research program: Secondary | ICD-10-CM

## 2024-10-16 LAB — GENECONNECT MOLECULAR SCREEN: Genetic Analysis Overall Interpretation: NEGATIVE

## 2024-10-20 ENCOUNTER — Ambulatory Visit
Admission: EM | Admit: 2024-10-20 | Discharge: 2024-10-20 | Disposition: A | Discharge status: Home / Self Care | Attending: Internal Medicine | Admitting: Internal Medicine

## 2024-10-20 DIAGNOSIS — J029 Acute pharyngitis, unspecified: Secondary | ICD-10-CM | POA: Insufficient documentation

## 2024-10-20 DIAGNOSIS — R0989 Other specified symptoms and signs involving the circulatory and respiratory systems: Secondary | ICD-10-CM | POA: Diagnosis not present

## 2024-10-20 LAB — POC SOFIA SARS ANTIGEN FIA: SARS Coronavirus 2 Ag: NEGATIVE

## 2024-10-20 LAB — POCT INFLUENZA A/B
Influenza A, POC: NEGATIVE
Influenza B, POC: NEGATIVE

## 2024-10-20 LAB — POCT RAPID STREP A (OFFICE): Rapid Strep A Screen: NEGATIVE

## 2024-10-20 MED ORDER — FLUTICASONE PROPIONATE 50 MCG/ACT NA SUSP: 1.0000 | Freq: Every day | NASAL | 0 refills | Status: AC

## 2024-10-20 MED ORDER — CHLORASEPTIC 1.4 % MT LIQD: 1.0000 | OROMUCOSAL | 0 refills | Status: AC | PRN

## 2024-10-20 NOTE — Discharge Instructions (Addendum)
 COVID, flu, strep tests are negative.  Throat culture is pending.  We will call if it is abnormal.  Suspect that you have a viral illness as we discussed that should run its course.  I have prescribed you 2 medications to help with symptoms.  Please follow-up if any symptoms persist or worsen.

## 2024-10-20 NOTE — ED Triage Notes (Signed)
 Pt c/o sore throat x 2 days. Slightly runny nose. Denies fever. Sometimes pain in ears. Cough drops prn.

## 2024-10-20 NOTE — ED Provider Notes (Signed)
 [facility]    CSN: [id number] Arrival date & time: 10/20/24  9057      History   Chief Complaint Chief Complaint  Patient presents with   Sore Throat    HPI Mia Wilson is a 48 y.o. female.   Patient presents with sore throat and runny nose for 2 days.  Denies cough or fever.  Her husband had similar symptoms approximately 1 week ago.  Patient has been using cough drops for sore throat.   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   Uterine fibroid 11/17/2018   Fibroids 11/17/2018   Pure hypercholesterolemia 04/02/2015   Diabetes (HCC) 11/20/2013   Anemia 11/14/2013   Perimenopausal menorrhagia 11/14/2013   Type II diabetes mellitus, uncontrolled 06/20/2013   PCOS (polycystic ovarian syndrome)     Past Surgical History:  Procedure Laterality Date   BREAST EXCISIONAL BIOPSY Right    BREAST SURGERY     benign   IR ANGIOGRAM PELVIS SELECTIVE OR SUPRASELECTIVE  11/17/2018   IR ANGIOGRAM PELVIS SELECTIVE OR SUPRASELECTIVE  11/17/2018   IR ANGIOGRAM SELECTIVE EACH ADDITIONAL VESSEL  11/17/2018   IR ANGIOGRAM SELECTIVE EACH ADDITIONAL VESSEL  11/17/2018   IR EMBO TUMOR ORGAN ISCHEMIA INFARCT INC GUIDE ROADMAPPING  11/17/2018   IR RADIOLOGIST EVAL & MGMT  09/14/2018   IR RADIOLOGIST EVAL & MGMT  12/13/2018   IR RADIOLOGIST EVAL & MGMT  06/28/2019   IR US  GUIDE VASC ACCESS RIGHT  11/17/2018   UTERINE FIBROID SURGERY      OB History   No obstetric history on file.      Home Medications    Prior to Admission medications   Medication Sig Start Date End Date Taking? Authorizing Provider   fluticasone (FLONASE) 50 MCG/ACT nasal spray Place 1 spray into both nostrils daily. 10/20/24  Yes Reizy Dunlow, Darryle E, FNP  phenol (CHLORASEPTIC) 1.4 % LIQD Use as directed 1 spray in the mouth or throat every 2 (two) hours as needed for throat irritation / pain. 10/20/24  Yes Lucendia Leard, Darryle E, FNP  atomoxetine  (STRATTERA ) 60 MG capsule Take 1 capsule (60 mg total) by mouth daily. 08/03/24   Teresa Redell LABOR, NP  atorvastatin  (LIPITOR) 10 MG tablet Take 2 tablets (20 mg total) by mouth daily. 07/31/24 10/29/24  Dartha Ernst, MD  Continuous Glucose Sensor (DEXCOM G7 SENSOR) MISC Use to check glucose continuously, change sensor every 10 days 05/10/24   Motwani, Komal, MD  desvenlafaxine  (PRISTIQ ) 50 MG 24 hr tablet Take 1 tablet (50 mg total) by mouth daily. 08/03/24   Teresa Redell LABOR, NP  estradiol (VIVELLE-DOT) 0.05 MG/24HR patch Place 1 patch onto the skin 2 (two) times a week. 03/28/24   [provider]  famotidine  (PEPCID ) 20 MG tablet TAKE 1 TABLET BY MOUTH EVERYDAY AT BEDTIME 08/05/24   McMichael, Bayley M, PA-C  Glucagon  (GVOKE HYPOPEN  1-PACK) 1 MG/0.2ML SOAJ Inject 1 mg into the skin as needed (low blood sugar with impaired consciousness). Patient not taking: Reported on 09/21/2024 10/20/23   Motwani, Komal, MD  Insulin  Disposable Pump (OMNIPOD 5 DEXG7G6  PODS GEN 5) MISC CHANGE POD EVERY 3 (THREE) DAYS. 08/10/24   Motwani, Komal, MD  insulin  glargine (LANTUS  SOLOSTAR) 100 UNIT/ML Solostar Pen Inject 14 Units into the skin daily. In case of pump failure 11/18/23 09/21/24  Dartha Ernst, MD  insulin  lispro (HUMALOG ) 100 UNIT/ML injection INJECT UPTO 50 UNITS INTO THE SKIN DAILY. USE MAX OF 50 UNITS DAILY VIA INSULIN  PUMP Patient not taking: Reported on 09/21/2024 12/10/23   Motwani, Komal, MD  Multiple Vitamin (MULTIVITAMIN WITH MINERALS) TABS Take 1 tablet by mouth daily.    [provider]  pantoprazole  (PROTONIX ) 40 MG tablet TAKE 1 TABLET BY MOUTH EVERY DAY Patient not taking: Reported on  09/21/2024 04/24/24   Mollie Nestor HERO, PA-C  progesterone (PROMETRIUM) 100 MG capsule  03/16/23   [provider]  vitamin B-12 (CYANOCOBALAMIN ) 1000 MCG tablet Take 1,000 mcg by mouth daily.    [provider]    Family History Family History  Problem Relation Age of Onset   Healthy Mother    Healthy Father    Diabetes Maternal Grandmother    Heart disease Maternal Grandmother    Breast cancer Maternal Grandmother        diagnosed in her 53's   Diabetes Paternal Grandmother    Diabetes Maternal Uncle    Heart disease Paternal Uncle    Hyperlipidemia Other    Hypertension Other    Stroke Other    Cancer Other     Social History Social History   Tobacco Use   Smoking status: Never    Passive exposure: Never   Smokeless tobacco: Never  Vaping Use   Vaping status: Never Used  Substance Use Topics   Alcohol use: No   Drug use: No     Allergies   Mounjaro  [tirzepatide ], Other, Bupropion, Strawberry extract, and Tomato   Review of Systems Review of Systems Per HPI  Physical Exam Triage Vital Signs ED Triage Vitals  Encounter Vitals Group     BP 10/20/24 1058 130/83     Girls Systolic BP Percentile --      Girls Diastolic BP Percentile --      Boys Systolic BP Percentile --      Boys Diastolic BP Percentile --      Pulse Rate 10/20/24 1058 68     Resp 10/20/24 1058 17     Temp 10/20/24 1058 98.1 F (36.7 C)     Temp Source 10/20/24 1058 Oral     SpO2 10/20/24 1058 98 %     Weight --      Height --      Head Circumference --      Peak Flow --      Pain Score 10/20/24 1059 5     Pain Loc --      Pain Education --      Exclude from Growth Chart --    No data found.  Updated Vital Signs BP 130/83 (BP Location: Right Arm)   Pulse 68   Temp 98.1 F (36.7 C) (Oral)   Resp 17   LMP 11/10/2018 (Approximate)   SpO2 98%   Visual Acuity Right Eye Distance:   Left Eye Distance:   Bilateral Distance:    Right Eye Near:   Left Eye  Near:    Bilateral Near:     Physical Exam Constitutional:      General: She is not in acute distress.    Appearance: Normal appearance. She is not toxic-appearing or diaphoretic.  HENT:     Head: Normocephalic and atraumatic.     Right Ear: Ear canal normal. A middle ear effusion is present. Tympanic membrane is not perforated, erythematous or bulging.     Left Ear: Ear canal normal. A middle ear effusion is present. Tympanic membrane is not perforated, erythematous or bulging.     Nose: Congestion present.     Mouth/Throat:     Mouth: Mucous membranes are moist.     Pharynx: Posterior oropharyngeal erythema present. No pharyngeal swelling or oropharyngeal exudate.     Tonsils: No tonsillar exudate or tonsillar abscesses.  Eyes:     Extraocular Movements: Extraocular movements intact.     Conjunctiva/sclera: Conjunctivae normal.     Pupils: Pupils are equal, round, and reactive to light.  Cardiovascular:     Rate and Rhythm: Normal rate and regular rhythm.     Pulses: Normal pulses.     Heart sounds: Normal heart sounds.  Pulmonary:     Effort: Pulmonary effort is normal. No respiratory distress.     Breath sounds: Normal breath sounds. No stridor. No wheezing, rhonchi or rales.  Musculoskeletal:        General: Normal range of motion.     Cervical back: Normal range of motion.  Skin:    General: Skin is warm and dry.  Neurological:     General: No focal deficit present.     Mental Status: She is alert and oriented to person, place, and time. Mental status is at baseline.  Psychiatric:        Mood and Affect: Mood normal.        Behavior: Behavior normal.      UC Treatments / Results  Labs (all labs ordered are listed, but only abnormal results are displayed) Labs Reviewed  CULTURE, GROUP A STREP [facility])  POCT RAPID STREP A (OFFICE)  POCT INFLUENZA A/B  POC SOFIA SARS ANTIGEN FIA    EKG   Radiology No results found.  Procedures Procedures (including critical  care time)  Medications Ordered in UC Medications - No data to display  Initial Impression / Assessment and Plan / UC Course  I have reviewed the triage vital signs and the nursing notes.  Pertinent labs & imaging results that were available during my care of the patient were reviewed by me and considered in my medical decision making (see chart for details).     Patient presents with symptoms likely from a viral upper respiratory infection. Do not suspect underlying cardiopulmonary process. Symptoms seem unlikely related to ACS, CHF or COPD exacerbations, pneumonia, pneumothorax. Patient is nontoxic appearing and not in need of emergent medical intervention.  COVID, flu, strep all negative.  Throat culture pending.  No signs of peritonsillar abscess on exam.  Recommended symptom control with over the counter medications, fluids, rest.  Chloraseptic spray prescribed for throat pain.  Flonase prescribed given fluid behind TMs.  Return if symptoms fail to improve in 1-2 weeks or you develop shortness of breath, chest pain, severe headache. Patient states understanding and is agreeable.  Discharged with PCP followup.  Final Clinical Impressions(s) / UC Diagnoses   Final diagnoses:  Sore throat  Runny nose     Discharge Instructions      COVID, flu, strep tests are negative.  Throat culture is pending.  We will call if it is abnormal.  Suspect that you have a viral illness as we discussed that should run its course.  I have prescribed you 2 medications  to help with symptoms.  Please follow-up if any symptoms persist or worsen.    ED Prescriptions     Medication Sig Dispense Auth. Provider   phenol (CHLORASEPTIC) 1.4 % LIQD Use as directed 1 spray in the mouth or throat every 2 (two) hours as needed for throat irritation / pain. 118 mL Ondrea Dow E, FNP   fluticasone [facility]) 50 MCG/ACT nasal spray Place 1 spray into both nostrils daily. 16 g Hazen Darryle BRAVO, [place]      PDMP not  reviewed this encounter.   Hazen Darryle BRAVO, [place] 10/20/24 1224

## 2024-10-22 ENCOUNTER — Telehealth: Admitting: Physician Assistant

## 2024-10-22 DIAGNOSIS — J029 Acute pharyngitis, unspecified: Secondary | ICD-10-CM | POA: Diagnosis not present

## 2024-10-22 MED ORDER — PENICILLIN V POTASSIUM 500 MG PO TABS: 500.0000 mg | ORAL_TABLET | Freq: Two times a day (BID) | ORAL | 0 refills | Status: AC

## 2024-10-22 NOTE — Progress Notes (Signed)
 Virtual Visit Consent   Mia Wilson, you are scheduled for a virtual visit with a [facility] provider today. Just as with appointments in the office, your consent must be obtained to participate. Your consent will be active for this visit and any virtual visit you may have with one of our providers in the next 365 days. If you have a MyChart account, a copy of this consent can be sent to you electronically.  As this is a virtual visit, video technology does not allow for your provider to perform a traditional examination. This may limit your provider's ability to fully assess your condition. If your provider identifies any concerns that need to be evaluated in person or the need to arrange testing (such as labs, EKG, etc.), we will make arrangements to do so. Although advances in technology are sophisticated, we cannot ensure that it will always work on either your end or our end. If the connection with a video visit is poor, the visit may have to be switched to a telephone visit. With either a video or telephone visit, we are not always able to ensure that we have a secure connection.  By engaging in this virtual visit, you consent to the provision of healthcare and authorize for your insurance to be billed (if applicable) for the services provided during this visit. Depending on your insurance coverage, you may receive a charge related to this service.  I need to obtain your verbal consent now. Are you willing to proceed with your visit today? Shyra NORIE LATENDRESSE has provided verbal consent on 10/22/2024 for a virtual visit (video or telephone). Dayvin Aber, PA-C  Date: 10/22/2024 12:03 PM   Virtual Visit via Video Note   I, Antigone Crowell, connected with  CARRYE GOLLER  ([id number], 17-Oct-1976) on 10/22/24 at 11:45 AM EST by a video-enabled telemedicine application and verified that I am speaking with the correct person using two identifiers.  Location: Patient: Virtual Visit Location  Patient: Home Provider: Virtual Visit Location Provider: Home Office   I discussed the limitations of evaluation and management by telemedicine and the availability of in person appointments. The patient expressed understanding and agreed to proceed.    History of Present Illness: Mia Wilson is a 48 y.o. who identifies as a female who was assigned female at birth, and is being seen today for sore throat.  HPI: Patient presents for evaluation of sore throat going on for the last 2 to 3 days that worsened overnight.  She reports pain with swallowing saliva, drinking water.  Reports sensation of swallowing glass.  She denies any cough, congestion.  Patient does admit that she has been sleeping a lot more and feels fatigued.  She denies any known fever, chills, nausea, vomiting, any sick contacts.  Patient was seen at urgent care 2 days ago, tested negative for COVID and flu and rapid strep, culture is pending.  Per chart review culture shows prelim read without any significant results.    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  Required IV fluids   Other Anaphylaxis and Hives    Peaches , tomatoes, strawberry   Bupropion Other (See Comments)    Other Reaction(s): doesn't tolerate doses over 300mg ,tremors   Strawberry Extract Hives    Facial hives and mouth hives   Tomato  Hives    Facial hives and mouth hives   Medications:  Current Outpatient Medications:    penicillin  v potassium (VEETID) 500 MG tablet, Take 1 tablet (500 mg total) by mouth 2 (two) times daily for 10 days., Disp: 20 tablet, Rfl: 0   atomoxetine  (STRATTERA ) 60 MG capsule, Take 1 capsule (60 mg total) by mouth daily., Disp: 30 capsule, Rfl: 3   atorvastatin  (LIPITOR) 10 MG tablet, Take 2 tablets (20 mg total) by mouth daily., Disp: 90 tablet, Rfl: 1   Continuous Glucose Sensor (DEXCOM G7 SENSOR) MISC, Use to check glucose continuously, change sensor every 10 days, Disp: 9 each, Rfl: 3   desvenlafaxine  (PRISTIQ ) 50 MG 24 hr tablet, Take 1 tablet (50 mg total) by mouth daily., Disp: 30 tablet, Rfl: 2   estradiol (VIVELLE-DOT) 0.05 MG/24HR patch, Place 1 patch onto the skin 2 (two) times a week., Disp: , Rfl:    famotidine  (PEPCID ) 20 MG tablet, TAKE 1 TABLET BY MOUTH EVERYDAY AT BEDTIME, Disp: 90 tablet, Rfl: 0   fluticasone  (FLONASE ) 50 MCG/ACT nasal spray, Place 1 spray into both nostrils daily., Disp: 16 g, Rfl: 0   Glucagon  (GVOKE HYPOPEN  1-PACK) 1 MG/0.2ML SOAJ, Inject 1 mg into the skin as needed (low blood sugar with impaired consciousness). (Patient not taking: Reported on 09/21/2024), Disp: 0.4 mL, Rfl: 2   Insulin  Disposable Pump (OMNIPOD 5 DEXG7G6 PODS GEN 5) MISC, CHANGE POD EVERY 3 (THREE) DAYS., Disp: 30 each, Rfl: 2   insulin  glargine (LANTUS  SOLOSTAR) 100 UNIT/ML Solostar Pen, Inject 14 Units into the skin daily. In case of pump failure, Disp: 6 mL, Rfl: 1   insulin  lispro (HUMALOG ) 100 UNIT/ML injection, INJECT UPTO 50 UNITS INTO THE SKIN DAILY. USE MAX OF 50 UNITS DAILY VIA INSULIN  PUMP (Patient not taking: Reported on 09/21/2024), Disp: 75 mL, Rfl: 3   Multiple Vitamin (MULTIVITAMIN WITH MINERALS) TABS, Take 1 tablet by mouth daily., Disp: , Rfl:    pantoprazole  (PROTONIX ) 40 MG tablet, TAKE 1 TABLET BY MOUTH EVERY DAY (Patient not taking: Reported on 09/21/2024), Disp: 90 tablet,  Rfl: 0   phenol (CHLORASEPTIC) 1.4 % LIQD, Use as directed 1 spray in the mouth or throat every 2 (two) hours as needed for throat irritation / pain., Disp: 118 mL, Rfl: 0   progesterone (PROMETRIUM) 100 MG capsule, , Disp: , Rfl:    vitamin B-12 (CYANOCOBALAMIN ) 1000 MCG tablet, Take 1,000 mcg by mouth daily., Disp: , Rfl:   Observations/Objective: Patient is well-developed, well-nourished in no acute distress.  Resting comfortably  at home.  Head is normocephalic, atraumatic.  Posterior pharyngeal erythema, uvula is midline, no exudates patient does appear to be discomfort swallowing saliva reports anterior cervical lymph node edema No labored breathing.  Speech is clear and coherent with logical content.  Patient is alert and oriented at baseline.    Assessment and Plan: 1. Acute pharyngitis, unspecified etiology (Primary) - penicillin  v potassium (VEETID) 500 MG tablet; Take 1 tablet (500 mg total) by mouth 2 (two) times daily for 10 days.  Dispense: 20  tablet; Refill: 0  Given presentation, symptoms at this time we will treat for strep while awaiting culture results.  Patient was given precautions.  Supportive treatment discussed at home.  Follow Up Instructions: I discussed the assessment and treatment plan with the patient. The patient was provided an opportunity to ask questions and all were answered. The patient agreed with the plan and demonstrated an understanding of the instructions.  A copy of instructions were sent to the patient via MyChart unless otherwise noted below.    The patient was advised to call back or seek an in-person evaluation if the symptoms worsen or if the condition fails to improve as anticipated.    Miette Molenda, PA-C

## 2024-10-22 NOTE — Patient Instructions (Signed)
 Mia Wilson, thank you for joining Kataleena Holsapple, PA-C for today's virtual visit.  While this provider is not your primary care provider (PCP), if your PCP is located in our provider database this encounter information will be shared with them immediately following your visit.   A [facility] MyChart account gives you access to today's visit and all your visits, tests, and labs performed at [facility]  click here if you don't have a [facility] MyChart account or go to [email or web address]  Consent: (Patient) Mia Wilson provided verbal consent for this virtual visit at the beginning of the encounter.  Current Medications:  Current Outpatient Medications:    atomoxetine  (STRATTERA ) 60 MG capsule, Take 1 capsule (60 mg total) by mouth daily., Disp: 30 capsule, Rfl: 3   atorvastatin  (LIPITOR) 10 MG tablet, Take 2 tablets (20 mg total) by mouth daily., Disp: 90 tablet, Rfl: 1   Continuous Glucose Sensor (DEXCOM G7 SENSOR) MISC, Use to check glucose continuously, change sensor every 10 days, Disp: 9 each, Rfl: 3   desvenlafaxine  (PRISTIQ ) 50 MG 24 hr tablet, Take 1 tablet (50 mg total) by mouth daily., Disp: 30 tablet, Rfl: 2   estradiol (VIVELLE-DOT) 0.05 MG/24HR patch, Place 1 patch onto the skin 2 (two) times a week., Disp: , Rfl:    famotidine  (PEPCID ) 20 MG tablet, TAKE 1 TABLET BY MOUTH EVERYDAY AT BEDTIME, Disp: 90 tablet, Rfl: 0   fluticasone (FLONASE) 50 MCG/ACT nasal spray, Place 1 spray into both nostrils daily., Disp: 16 g, Rfl: 0   Glucagon  (GVOKE HYPOPEN  1-PACK) 1 MG/0.2ML SOAJ, Inject 1 mg into the skin as needed (low blood sugar with impaired consciousness). (Patient not taking: Reported on 09/21/2024), Disp: 0.4 mL, Rfl: 2   Insulin  Disposable Pump (OMNIPOD 5 DEXG7G6 PODS GEN 5) MISC, CHANGE POD EVERY 3 (THREE) DAYS., Disp: 30 each, Rfl: 2   insulin  glargine (LANTUS  SOLOSTAR) 100 UNIT/ML Solostar Pen, Inject 14 Units into the skin daily. In case of  pump failure, Disp: 6 mL, Rfl: 1   insulin  lispro (HUMALOG ) 100 UNIT/ML injection, INJECT UPTO 50 UNITS INTO THE SKIN DAILY. USE MAX OF 50 UNITS DAILY VIA INSULIN  PUMP (Patient not taking: Reported on 09/21/2024), Disp: 75 mL, Rfl: 3   Multiple Vitamin (MULTIVITAMIN WITH MINERALS) TABS, Take 1 tablet by mouth daily., Disp: , Rfl:    pantoprazole  (PROTONIX ) 40 MG tablet, TAKE 1 TABLET BY MOUTH EVERY DAY (Patient not taking: Reported on 09/21/2024), Disp: 90 tablet, Rfl: 0   phenol (CHLORASEPTIC) 1.4 % LIQD, Use as directed 1 spray in the mouth or throat every 2 (two) hours as needed for throat irritation / pain., Disp: 118 mL, Rfl: 0   progesterone (PROMETRIUM) 100 MG capsule, , Disp: , Rfl:    vitamin B-12 (CYANOCOBALAMIN ) 1000 MCG tablet, Take 1,000 mcg by mouth daily., Disp: , Rfl:    Medications ordered in this encounter:  No orders of the defined types were placed in this encounter.    *If you need refills on other medications prior to your next appointment, please contact your pharmacy*  Follow-Up: Call back or seek an in-person evaluation if the symptoms worsen or if the condition fails to improve as anticipated.  [facility] [phone]  Other Instructions   If you have been instructed to have an in-person evaluation today at a local Urgent Care facility, please use the link below. It will take you to a list of all of our available [facility], including address, phone number and hours of operation. Please do not delay care.  [facility]  If you or a family member do not have a primary care provider, use the link below to schedule a visit and establish care. When you choose a [facility] primary care physician or advanced practice provider, you gain a long-term partner in health. Find a Primary Care Provider  Learn more about [facility]'s in-office and virtual care options: [facility] [place] - Get Care Now

## 2024-10-23 ENCOUNTER — Encounter (HOSPITAL_BASED_OUTPATIENT_CLINIC_OR_DEPARTMENT_OTHER): Payer: Self-pay | Admitting: Family Medicine

## 2024-10-23 ENCOUNTER — Telehealth: Payer: Self-pay

## 2024-10-23 LAB — CULTURE, GROUP A STREP (THRC)

## 2024-10-23 NOTE — Telephone Encounter (Signed)
 Copied from CRM #[id number]. Topic: Clinical - Medical Advice >> Oct 23, 2024  1:34 PM Amy B wrote: Reason for CRM: Patient requests a call back to go over her medications.  She states she has a new medication and wants to make sure it does not interact with her other meds.  Please call [phone]

## 2024-10-24 ENCOUNTER — Ambulatory Visit (INDEPENDENT_AMBULATORY_CARE_PROVIDER_SITE_OTHER): Admitting: Professional Counselor

## 2024-10-31 ENCOUNTER — Telehealth: Admitting: "Endocrinology

## 2024-10-31 ENCOUNTER — Telehealth: Payer: Self-pay

## 2024-10-31 ENCOUNTER — Encounter: Payer: Self-pay | Admitting: "Endocrinology

## 2024-10-31 VITALS — Ht <= 58 in | Wt 172.0 lb

## 2024-10-31 DIAGNOSIS — Z794 Long term (current) use of insulin: Secondary | ICD-10-CM

## 2024-10-31 DIAGNOSIS — E1065 Type 1 diabetes mellitus with hyperglycemia: Secondary | ICD-10-CM | POA: Diagnosis not present

## 2024-10-31 DIAGNOSIS — E7800 Pure hypercholesterolemia, unspecified: Secondary | ICD-10-CM | POA: Diagnosis not present

## 2024-10-31 DIAGNOSIS — Z9641 Presence of insulin pump (external) (internal): Secondary | ICD-10-CM | POA: Diagnosis not present

## 2024-10-31 NOTE — Telephone Encounter (Signed)
-----   Message from [facility] sent at 10/31/2024  9:46 AM EST ----- Please ask pt is she has been complaint with atorvastatin  10 mg/lipitor 10 mg every day. If yes, I'd recommend increase to 40 mg given her high LDL/bad cholesterol

## 2024-10-31 NOTE — Telephone Encounter (Signed)
 Lvm for to call back regarding medication question.

## 2024-10-31 NOTE — Patient Instructions (Signed)

## 2024-10-31 NOTE — Progress Notes (Signed)
 The patient reports they are currently: Mia Wilson. I spent 11 minutes on the video with the patient on the date of service. I spent an additional 5 minutes on pre- and post-visit activities on the date of service.   The patient was physically located in [place]  or a state in which I am permitted to provide care. The patient and/or parent/guardian understood that s/he may incur co-pays and cost sharing, and agreed to the telemedicine visit. The visit was reasonable and appropriate under the circumstances given the patient's presentation at the time.  The patient and/or parent/guardian has been advised of the potential risks and limitations of this mode of treatment (including, but not limited to, the absence of in-person examination) and has agreed to be treated using telemedicine. The patient's/patient's family's questions regarding telemedicine have been answered.   The patient and/or parent/guardian has also been advised to contact their provider's office for worsening conditions, and seek emergency medical treatment and/or call 911 if the patient deems either necessary.     Outpatient Endocrinology Note [name], MD  10/31/24   Mia Wilson 1976/02/09 [id number]  Referring Provider: de Cuba, Raymond J, MD Primary Care Provider: de Cuba, Raymond J, MD Reason for consultation: Subjective   Assessment & Plan  Diagnoses and all orders for this visit:  Uncontrolled type 1 diabetes mellitus with hyperglycemia (HCC)  Insulin  pump in place  Insulin  dose changed (HCC)  Pure hypercholesterolemia    Diabetes Type I complicated by hyperglycemia C-pep 0.5, BG 212, Insulin  Ab + Lab Results  Component Value Date   GFR 92.07 10/13/2023   Hba1c goal less than 7, current Hba1c is  Lab Results  Component Value Date   HGBA1C 8.1 (H) 09/07/2024   Will recommend the following: OMNIPOD 5 insulin  pump with Humalog  and DexCom G7 BASAL RATES: Midnight = 0.3, 5 AM = 0.45, 9 AM  = 0.3 and 7 PM = 0. 45 Bolus settings: I/C ratio 8 (5pm-12am: 8) ISF: 50, Target: 110, with correction over 130.  10/31/24: Recommend adding 0.5 units extra to lunch bolus, subtracting 0.5 units from supper bolus and avoiding bedtime snack unless BG is low  Stopped farxiga  Stopped mounjaro : Mounjaro  1 dose led to urgent care due to dehydrationad  No known contraindications/side effects to any of above medications Glucagon  discussed and prescribed with refills on 10/20/23  -Last LD and Tg are as follows: Lab Results  Component Value Date   LDLCALC 125 (H) 09/07/2024    Lab Results  Component Value Date   TRIG 48 09/07/2024   -On atorvastatin  10 mg every day: LDL 125 -10/31/24: if compliant with above, recommend increase to atorvastatin  40 mg every day -Follow low fat diet and exercise   -Blood pressure goal <140/90 - Microalbumin/creatinine goal is < 30 -Last MA/Cr is as follows: Lab Results  Component Value Date   MICROALBUR <0.2 07/26/2024   -not on ACE/ARB  -diet changes including salt restriction -limit eating outside -counseled BP targets per standards of diabetes care -uncontrolled blood pressure can lead to retinopathy, nephropathy and cardiovascular and atherosclerotic heart disease  Reviewed and counseled on: -A1C target -Blood sugar targets -Complications of uncontrolled diabetes  -Checking blood sugar before meals and bedtime and bring log next visit -All medications with mechanism of action and side effects -Hypoglycemia management: rule of 15's, Glucagon  Emergency Kit and medical alert ID -low-carb low-fat plate-method diet -At least 20 minutes of physical activity per day -Annual dilated retinal eye exam and foot exam -compliance  and follow up needs -follow up as scheduled or earlier if problem gets worse  Call if blood sugar is less than 70 or consistently above 250    Take a 15 gm snack of carbohydrate at bedtime before you go to sleep if your blood  sugar is less than 100.    If you are going to fast after midnight for a test or procedure, ask your physician for instructions on how to reduce/decrease your insulin  dose.    Call if blood sugar is less than 70 or consistently above 250  -Treating a low sugar by rule of 15  (15 gms of sugar every 15 min until sugar is more than 70) If you feel your sugar is low, test your sugar to be sure If your sugar is low (less than 70), then take 15 grams of a fast acting Carbohydrate (3-4 glucose tablets or glucose gel or 4 ounces of juice or regular soda) Recheck your sugar 15 min after treating low to make sure it is more than 70 If sugar is still less than 70, treat again with 15 grams of carbohydrate          Don't drive the hour of hypoglycemia  If unconscious/unable to eat or drink by mouth, use glucagon  injection or nasal spray baqsimi and call 911. Can repeat again in 15 min if still unconscious.  Return in about 4 weeks (around 11/28/2024) for tele-visit: 3:20 p.   I have reviewed current medications, nurse's notes, allergies, vital signs, past medical and surgical history, family medical history, and social history for this encounter. Counseled patient on symptoms, examination findings, lab findings, imaging results, treatment decisions and monitoring and prognosis. The patient understood the recommendations and agrees with the treatment plan. All questions regarding treatment plan were fully answered.  [name], MD  10/31/24   History of Present Illness Mia Wilson is a 48 y.o. year old female who presents for follow up of Type II diabetes mellitus.  Mia Wilson was first diagnosed in 2009.   Diabetes education +  Diagnosis: date of diagnosis: 2009, initially asymptomatic  RECENT history:   Current pump settings:  OMNIPOD 5 insulin  pump with Humalog  and DexCom G7 BASAL RATES: Midnight = 0.3, 5 AM = 0.45, 9 AM = 0.3 and 7 PM = 0. 45 Bolus settings: I/C ratio 9  (5pm-12am: 9) ISF: 50, Target: 110, with correction over 130.  Insulin : Currently using Humalog   Non-insulin  hypoglycemic drugs: stopped Farxiga  10 mg daily  COMPLICATIONS -  MI/Stroke -  retinopathy -  neuropathy -  nephropathy  BLOOD SUGAR DATA CGM interpretation: At today's visit, we reviewed her CGM downloads. The full report is scanned in the media. Reviewing the CGM trends, BG are mildly elevated after lunch around 2pm, and low around 7pm and 10 pm, with some highs overnight.  Physical Exam  Ht 1' (0.305 m)   Wt 172 lb (78 kg)   LMP 11/10/2018 (Approximate)   BMI 839.79 kg/m    Constitutional: well developed, well nourished Head: normocephalic, atraumatic Eyes: sclera anicteric, no redness Neck: supple Lungs: normal respiratory effort Neurology: alert and oriented Skin: dry, no appreciable rashes Musculoskeletal: no appreciable defects Psychiatric: normal mood and affect Diabetic Foot Exam - Simple   No data filed      Current Medications Patient's Medications  New Prescriptions   No medications on file  Previous Medications   ATOMOXETINE  (STRATTERA ) 60 MG CAPSULE    Take 1 capsule (60 mg  total) by mouth daily.   ATORVASTATIN  (LIPITOR) 10 MG TABLET    Take 2 tablets (20 mg total) by mouth daily.   CONTINUOUS GLUCOSE SENSOR (DEXCOM G7 SENSOR) MISC    Use to check glucose continuously, change sensor every 10 days   DESVENLAFAXINE  (PRISTIQ ) 50 MG 24 HR TABLET    Take 1 tablet (50 mg total) by mouth daily.   ESTRADIOL (VIVELLE-DOT) 0.05 MG/24HR PATCH    Place 1 patch onto the skin 2 (two) times a week.   FAMOTIDINE  (PEPCID ) 20 MG TABLET    TAKE 1 TABLET BY MOUTH EVERYDAY AT BEDTIME   FLUTICASONE  (FLONASE ) 50 MCG/ACT NASAL SPRAY    Place 1 spray into both nostrils daily.   GLUCAGON  (GVOKE HYPOPEN  1-PACK) 1 MG/0.2ML SOAJ    Inject 1 mg into the skin as needed (low blood sugar with impaired consciousness).   INSULIN  DISPOSABLE PUMP (OMNIPOD 5 DEXG7G6 PODS GEN 5) MISC     CHANGE POD EVERY 3 (THREE) DAYS.   INSULIN  GLARGINE (LANTUS  SOLOSTAR) 100 UNIT/ML SOLOSTAR PEN    Inject 14 Units into the skin daily. In case of pump failure   INSULIN  LISPRO (HUMALOG ) 100 UNIT/ML INJECTION    INJECT UPTO 50 UNITS INTO THE SKIN DAILY. USE MAX OF 50 UNITS DAILY VIA INSULIN  PUMP   MULTIPLE VITAMIN (MULTIVITAMIN WITH MINERALS) TABS    Take 1 tablet by mouth daily.   PANTOPRAZOLE  (PROTONIX ) 40 MG TABLET    TAKE 1 TABLET BY MOUTH EVERY DAY   PENICILLIN  V POTASSIUM (VEETID) 500 MG TABLET    Take 1 tablet (500 mg total) by mouth 2 (two) times daily for 10 days.   PHENOL (CHLORASEPTIC) 1.4 % LIQD    Use as directed 1 spray in the mouth or throat every 2 (two) hours as needed for throat irritation / pain.   PROGESTERONE (PROMETRIUM) 100 MG CAPSULE       VITAMIN B-12 (CYANOCOBALAMIN ) 1000 MCG TABLET    Take 1,000 mcg by mouth daily.  Modified Medications   No medications on file  Discontinued Medications   No medications on file    Allergies Allergies  Allergen Reactions   Mounjaro  [Tirzepatide ] Diarrhea and Nausea And Vomiting    Lost 15 pounds in 1 week.  Required IV fluids   Other Anaphylaxis and Hives    Peaches , tomatoes, strawberry   Bupropion Other (See Comments)    Other Reaction(s): doesn't tolerate doses over 300mg ,tremors   Strawberry Extract Hives    Facial hives and mouth hives   Tomato Hives    Facial hives and mouth hives    Past Medical History 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)    Fibroids    PCOS (polycystic ovarian syndrome)     Past Surgical History Past Surgical History:  Procedure Laterality Date   BREAST EXCISIONAL BIOPSY Right    BREAST SURGERY     benign   IR ANGIOGRAM PELVIS SELECTIVE OR SUPRASELECTIVE  11/17/2018   IR ANGIOGRAM PELVIS SELECTIVE OR SUPRASELECTIVE  11/17/2018   IR ANGIOGRAM SELECTIVE EACH ADDITIONAL VESSEL  11/17/2018   IR ANGIOGRAM SELECTIVE EACH ADDITIONAL VESSEL   11/17/2018   IR EMBO TUMOR ORGAN ISCHEMIA INFARCT INC GUIDE ROADMAPPING  11/17/2018   IR RADIOLOGIST EVAL & MGMT  09/14/2018   IR RADIOLOGIST EVAL & MGMT  12/13/2018   IR RADIOLOGIST EVAL & MGMT  06/28/2019   IR US  GUIDE VASC ACCESS RIGHT  11/17/2018   UTERINE FIBROID SURGERY      Family History family history includes Breast cancer in her maternal grandmother; Cancer in an other family member; Diabetes in her maternal grandmother, maternal uncle, and paternal grandmother; Healthy in her father and mother; Heart disease in her maternal grandmother and paternal uncle; Hyperlipidemia in an other family member; Hypertension in an other family member; Stroke in an other family member.  Social History Social History   Socioeconomic History   Marital status: Married    Spouse name: Not on file   Number of children: 4   Years of education: Not on file   Highest education level: Master's degree (e.g., MA, MS, MEng, MEd, MSW, MBA)  Occupational History   Occupation: research admn.  Tobacco Use   Smoking status: Never    Passive exposure: Never   Smokeless tobacco: Never  Vaping Use   Vaping status: Never Used  Substance and Sexual Activity   Alcohol use: No   Drug use: No   Sexual activity: Yes  Other Topics Concern   Not on file  Social History Narrative   Living in [place] with husband and two adopted children 15 and 14. Currently on FMLA.  She is not employed at this time. She has been her 2 youngest adopted children at home. Not involved a lot else here at this time. She has 2 other children also. She walks a little bit but does not do a lot since she has left her with fatigue.   Social Drivers of Corporate Investment Banker Strain: Medium Risk (09/21/2024)   Overall Financial Resource Strain (CARDIA)    Difficulty of Paying Living Expenses: Somewhat hard  Food Insecurity: No Food Insecurity (09/21/2024)   Hunger Vital Sign    Worried About Running Out of Food in the Last  Year: Never true    Ran Out of Food in the Last Year: Never true  Transportation Needs: No Transportation Needs (09/21/2024)   PRAPARE - Administrator, Civil Service (Medical): No    Lack of Transportation (Non-Medical): No  Physical Activity: Insufficiently Active (09/21/2024)   Exercise Vital Sign    Days of Exercise per Week: 1 day    Minutes of Exercise per Session: 20 min  Stress: Stress Concern Present (09/21/2024)   [employer] of Occupational Health - Occupational Stress Questionnaire    Feeling of Stress: Very much  Social Connections: Socially Integrated (09/21/2024)   Social Connection and Isolation Panel    Frequency of Communication with Friends and Family: More than three times a week    Frequency of Social Gatherings with Friends and Family: Once a week    Attends Religious Services: More than 4 times per year    Active Member of Clubs or Organizations: Yes    Attends Banker Meetings: More than 4 times per year    Marital Status: Married  Catering Manager Violence: Unknown (02/26/2022)   Received from [facility]   HITS    Physically Hurt: Not on file    Insult or Talk Down To: Not on file    Threaten Physical Harm: Not on file    Scream or Curse: Not on file    Lab Results  Component Value Date   HGBA1C 8.1 (H) 09/07/2024   HGBA1C 8.5 (A) 07/31/2024   HGBA1C 7.3 (A) 04/28/2024   Lab Results  Component Value Date   CHOL 227 (H) 09/07/2024   Lab Results  Component Value Date  HDL 94 09/07/2024   Lab Results  Component Value Date   LDLCALC 125 (H) 09/07/2024   Lab Results  Component Value Date   TRIG 48 09/07/2024   Lab Results  Component Value Date   CHOLHDL 2.4 09/07/2024   Lab Results  Component Value Date   CREATININE 0.79 09/07/2024   Lab Results  Component Value Date   GFR 92.07 10/13/2023   Lab Results  Component Value Date   MICROALBUR <0.2 07/26/2024      Component Value Date/Time   NA 143  09/07/2024 1131   K 4.9 09/07/2024 1131   CL 102 09/07/2024 1131   CO2 24 09/07/2024 1131   GLUCOSE 101 (H) 09/07/2024 1131   GLUCOSE 93 07/26/2024 1514   BUN 15 09/07/2024 1131   CREATININE 0.79 09/07/2024 1131   CREATININE 0.64 07/26/2024 1514   CALCIUM  9.9 09/07/2024 1131   PROT 6.8 09/07/2024 1131   ALBUMIN 4.4 09/07/2024 1131   AST 24 09/07/2024 1131   ALT 18 09/07/2024 1131   ALKPHOS 117 (H) 09/07/2024 1131   BILITOT 0.4 09/07/2024 1131   GFRNONAA >60 11/15/2023 1609   GFRAA >60 11/17/2018 0758      Latest Ref Rng & Units 09/07/2024   11:31 AM 07/26/2024    3:14 PM 02/10/2024    4:00 PM  BMP  Glucose 70 - 99 mg/dL 898  93  837   BUN 6 - 24 mg/dL 15  11  18    Creatinine 0.57 - 1.00 mg/dL 9.20  9.35  8.87   BUN/Creat Ratio 9 - 23 19  SEE NOTE:  16   Sodium 134 - 144 mmol/L 143  138  143   Potassium 3.5 - 5.2 mmol/L 4.9  4.4  5.2   Chloride 96 - 106 mmol/L 102  100  102   CO2 20 - 29 mmol/L 24  30  CANCELED   Calcium  8.7 - 10.2 mg/dL 9.9  9.2  89.8        Component Value Date/Time   WBC 4.0 09/07/2024 1131   WBC 7.0 11/15/2023 1609   RBC 4.72 09/07/2024 1131   RBC 4.48 11/15/2023 1609   HGB 12.6 09/07/2024 1131   HCT 40.3 09/07/2024 1131   PLT 330 09/07/2024 1131   MCV 85 09/07/2024 1131   MCH 26.7 09/07/2024 1131   MCH 26.3 11/15/2023 1609   MCHC 31.3 (L) 09/07/2024 1131   MCHC 31.5 11/15/2023 1609   RDW 13.0 09/07/2024 1131   LYMPHSABS 2.3 09/07/2024 1131   MONOABS 0.6 11/15/2023 1609   EOSABS 0.1 09/07/2024 1131   BASOSABS 0.0 09/07/2024 1131     Parts of this note may have been dictated using voice recognition software. There may be variances in spelling and vocabulary which are unintentional. Not all errors are proofread. Please notify the dino if any discrepancies are noted or if the meaning of any statement is not clear.

## 2024-11-01 ENCOUNTER — Institutional Professional Consult (permissible substitution): Payer: Self-pay | Admitting: Neurology

## 2024-11-01 ENCOUNTER — Telehealth: Payer: Self-pay | Admitting: Neurology

## 2024-11-01 ENCOUNTER — Encounter: Payer: Self-pay | Admitting: "Endocrinology

## 2024-11-01 NOTE — Telephone Encounter (Signed)
 Due to a conflict pt had to cx appointment for today

## 2024-11-07 ENCOUNTER — Encounter: Payer: Self-pay | Admitting: Behavioral Health

## 2024-11-07 ENCOUNTER — Ambulatory Visit: Admitting: Behavioral Health

## 2024-11-07 DIAGNOSIS — F902 Attention-deficit hyperactivity disorder, combined type: Secondary | ICD-10-CM | POA: Diagnosis not present

## 2024-11-07 DIAGNOSIS — F411 Generalized anxiety disorder: Secondary | ICD-10-CM | POA: Diagnosis not present

## 2024-11-07 DIAGNOSIS — F331 Major depressive disorder, recurrent, moderate: Secondary | ICD-10-CM

## 2024-11-07 MED ORDER — ATOMOXETINE HCL 60 MG PO CAPS: 60.0000 mg | ORAL_CAPSULE | Freq: Every day | ORAL | 3 refills | Status: AC

## 2024-11-07 MED ORDER — DESVENLAFAXINE SUCCINATE ER 100 MG PO TB24: 100.0000 mg | ORAL_TABLET | Freq: Every day | ORAL | 1 refills | Status: AC

## 2024-11-07 NOTE — Progress Notes (Signed)
 [facility] Med Check  [name],  MRN: [id number]  PCP: de [place], Mia J, MD  Date of Evaluation: 11/07/2024 Time spent:30 minutes  Chief Complaint:   HISTORY/CURRENT STATUS: HPI Mia Wilson, 48 year old female presents to this office for follow up and medication management.  Collateral information should be considered reliable.  Says that she has been having more depression recently.  Has had periods of tearfulness.  She is open to adjusting her medication today she reports her anxiety today 3/10, and depression at 5/10.  She denies history of mania, no psychosis, no auditory or visual hallucinations.  Denies SI or HI.   Past psychiatric medication trials: Zoloft Wellbutrin Lexapro Cymbalta  Strattera      Individual Medical History/ Review of Systems: Changes? :No   Allergies: Mounjaro  [tirzepatide ], Other, Bupropion, Strawberry extract, and Tomato  Current Medications: Current Medications[1] Medication Side Effects: none  Family Medical/ Social History: Changes? No  MENTAL HEALTH EXAM:  Last menstrual period 11/10/2018.There is no height or weight on file to calculate BMI.  General Appearance: Casual and Neat  Eye Contact:  Good  Speech:  Clear and Coherent  Volume:  Normal  Mood:  Anxious, Depressed, and Dysphoric  Affect:  Appropriate  Thought Process:  Coherent  Orientation:  Full (Time, Place, and Person)  Thought Content: Logical   Suicidal Thoughts:  No  Homicidal Thoughts:  No  Memory:  WNL  Judgement:  Good  Insight:  Good  Psychomotor Activity:  Normal  Concentration:  Concentration: Good  Recall:  Good  Fund of Knowledge: Good  Language: Good  Assets:  Desire for Improvement  ADL's:  Intact  Cognition: WNL  Prognosis:  Good    DIAGNOSES:    ICD-10-CM   1. Generalized anxiety disorder  F41.1 desvenlafaxine  (PRISTIQ ) 100 MG 24 hr tablet    2. Major depressive disorder, recurrent episode, moderate (HCC)  F33.1  desvenlafaxine  (PRISTIQ ) 100 MG 24 hr tablet    3. Attention deficit hyperactivity disorder (ADHD), combined type  F90.2 atomoxetine  (STRATTERA ) 60 MG capsule      Receiving Psychotherapy: No    RECOMMENDATIONS:   Greater than 50% of 30 min face to face time with patient was spent on counseling and coordination of care.  Discussed her recent mild decline with depression and anxiety recently.  She is having increased stressors at home with her youngest son.  Stated that she and her husband had to call the police on him.  Suspects some drug use.  She he is agreeable to increase of her Pristiq  today.    We agreed today to: Increase Pristiq  100 mg daily in the am Continue  taking Strattera  60  mg daily To follow-up in 6 weeks to reassess Will report worsening symptoms or side effects promptly Provided emergency contact information Discussed potential metabolic side effects associated with atypical antipsychotics, as well as potential risk for movement side effects. Advised pt to contact office if movement side effects occur.   Reviewed PDMP  Redell DELENA Pizza, NP     [1]  Current Outpatient Medications:    desvenlafaxine  (PRISTIQ ) 100 MG 24 hr tablet, Take 1 tablet (100 mg total) by mouth daily., Disp: 90 tablet, Rfl: 1   atomoxetine  (STRATTERA ) 60 MG capsule, Take 1 capsule (60 mg total) by mouth daily., Disp: 30 capsule, Rfl: 3   atorvastatin  (LIPITOR) 10 MG tablet, Take 2 tablets (20 mg total) by mouth daily., Disp: 90 tablet, Rfl: 1   Continuous Glucose Sensor (DEXCOM G7 SENSOR) MISC,  Use to check glucose continuously, change sensor every 10 days, Disp: 9 each, Rfl: 3   estradiol (VIVELLE-DOT) 0.05 MG/24HR patch, Place 1 patch onto the skin 2 (two) times a week., Disp: , Rfl:    famotidine  (PEPCID ) 20 MG tablet, TAKE 1 TABLET BY MOUTH EVERYDAY AT BEDTIME, Disp: 90 tablet, Rfl: 0   fluticasone  (FLONASE ) 50 MCG/ACT nasal spray, Place 1 spray into both nostrils daily., Disp: 16 g, Rfl: 0    Glucagon  (GVOKE HYPOPEN  1-PACK) 1 MG/0.2ML SOAJ, Inject 1 mg into the skin as needed (low blood sugar with impaired consciousness)., Disp: 0.4 mL, Rfl: 2   Insulin  Disposable Pump (OMNIPOD 5 DEXG7G6 PODS GEN 5) MISC, CHANGE POD EVERY 3 (THREE) DAYS., Disp: 30 each, Rfl: 2   insulin  glargine (LANTUS  SOLOSTAR) 100 UNIT/ML Solostar Pen, Inject 14 Units into the skin daily. In case of pump failure, Disp: 6 mL, Rfl: 1   insulin  lispro (HUMALOG ) 100 UNIT/ML injection, INJECT UPTO 50 UNITS INTO THE SKIN DAILY. USE MAX OF 50 UNITS DAILY VIA INSULIN  PUMP, Disp: 75 mL, Rfl: 3   Multiple Vitamin (MULTIVITAMIN WITH MINERALS) TABS, Take 1 tablet by mouth daily., Disp: , Rfl:    pantoprazole  (PROTONIX ) 40 MG tablet, TAKE 1 TABLET BY MOUTH EVERY DAY, Disp: 90 tablet, Rfl: 0   phenol (CHLORASEPTIC) 1.4 % LIQD, Use as directed 1 spray in the mouth or throat every 2 (two) hours as needed for throat irritation / pain., Disp: 118 mL, Rfl: 0   progesterone (PROMETRIUM) 100 MG capsule, , Disp: , Rfl:    vitamin B-12 (CYANOCOBALAMIN ) 1000 MCG tablet, Take 1,000 mcg by mouth daily., Disp: , Rfl:

## 2024-11-09 ENCOUNTER — Encounter: Payer: Self-pay | Admitting: Professional Counselor

## 2024-11-09 ENCOUNTER — Ambulatory Visit: Admitting: Professional Counselor

## 2024-11-09 DIAGNOSIS — F411 Generalized anxiety disorder: Secondary | ICD-10-CM | POA: Diagnosis not present

## 2024-11-09 DIAGNOSIS — F331 Major depressive disorder, recurrent, moderate: Secondary | ICD-10-CM | POA: Diagnosis not present

## 2024-11-09 NOTE — Progress Notes (Signed)
"   °      [facility] Counselor/Therapist Progress Note  [name], MRN: [id number],    Date: 11/09/2024  Time Spent: 1:12 PM to 2:10 PM  Treatment Type: Individual Therapy  Reported Symptoms: parenting concerns, stress, health concerns, compromised self care, worries, sadness, low mood, anhedonia, hypervigilance, sense of overwhelm, family concerns, phase of life concerns, exhaustion, frustration, irritability, anxiousness  Mental Status Exam:  Appearance:   Casual     Behavior:  Appropriate and Sharing  Motor:  Normal  Speech/Language:   Clear and Coherent and Normal Rate  Affect:  Appropriate and Congruent  Mood:  normal  Thought process:  normal  Thought content:    WNL  Sensory/Perceptual disturbances:    WNL  Orientation:  oriented to person, place, time/date, and situation  Attention:  Good  Concentration:  Good  Memory:  WNL  Fund of knowledge:   Good  Insight:    Good  Judgment:   Good  Impulse Control:  Good   Risk Assessment: Danger to Self:  No Self-injurious Behavior: No Danger to Others: No Duty to Warn:no Physical Aggression / Violence:No  Access to Firearms a concern: No  Gang Involvement:No   Subjective: Patient presented to session to address concerns of anxiety and depression.  She reported minimal progress at this time.  Patient processed the experience of exacerbated parenting concerns and stress.  She reflected on a series of events including as relates safety concerns and law enforcement involvement.  Counselor assisted patient in process work and helped to facilitate insight into patient concerning situation and resource motivation and awareness.  Counselor encouraged patient to resource greater care around circumstances and provided ideas regarding residential options for family member of main concern.  They discussed a variety of options available to patient and family as to how to manage sustained sense of crisis.  Counselor and  patient discussed matters of developmental phase per teenage sons, psychoeducation around substance use, and other issues.  Counselor and patient discussed patient diminished self-care and her health concerns, and impact of stress.  Counselor validated patient sense of overwhelm, and reinforced patient proactive efforts and her strengths and resiliency.  Interventions: Solution-Oriented/Positive Psychology, Humanistic/Existential, Insight-Oriented, and Resourcing, Safety Planning   Diagnosis:   ICD-10-CM   1. Generalized anxiety disorder  F41.1     2. Major depressive disorder, recurrent episode, moderate (HCC)  F33.1       Plan: Patient is scheduled for follow-up; continue process work and developing coping skills.  Patient short-term goal between sessions to prioritize looking into referrals and resources toward increased care regarding family matters of heightened concern, and prioritize self-care and holistic safety for self and family.  Mia Wilson, [facility]                   "

## 2024-11-12 ENCOUNTER — Other Ambulatory Visit: Payer: Self-pay | Admitting: Gastroenterology

## 2024-11-19 ENCOUNTER — Other Ambulatory Visit: Payer: Self-pay | Admitting: "Endocrinology

## 2024-11-20 ENCOUNTER — Telehealth: Payer: Self-pay

## 2024-11-20 NOTE — Telephone Encounter (Signed)
Pt needs a f/u appt scheduled

## 2024-11-21 ENCOUNTER — Ambulatory Visit: Admitting: Professional Counselor

## 2024-11-24 ENCOUNTER — Telehealth: Payer: Self-pay

## 2024-11-24 ENCOUNTER — Other Ambulatory Visit (HOSPITAL_COMMUNITY): Payer: Self-pay

## 2024-11-24 DIAGNOSIS — E7800 Pure hypercholesterolemia, unspecified: Secondary | ICD-10-CM

## 2024-11-24 DIAGNOSIS — E1165 Type 2 diabetes mellitus with hyperglycemia: Secondary | ICD-10-CM

## 2024-11-24 MED ORDER — OMNIPOD 5 DEXG7G6 PODS GEN 5 MISC: 1.0000 | 2 refills | Status: AC

## 2024-11-24 MED ORDER — ATORVASTATIN CALCIUM 40 MG PO TABS: 40.0000 mg | ORAL_TABLET | Freq: Every day | ORAL | 3 refills | Status: AC

## 2024-11-24 NOTE — Telephone Encounter (Signed)
 Pt needs pa done for the ominpod. Pt has been without pump for a week now.

## 2024-11-24 NOTE — Telephone Encounter (Signed)
 Pharmacy Patient Advocate Encounter   Received notification from Pt Calls Messages that prior authorization for Omnipods is required/requested.   Insurance verification completed.   The patient is insured through [employer].   Per test claim: PA required; PA submitted to above mentioned insurance via Latent Key/confirmation #/EOC [id number] Status is pending

## 2024-11-27 ENCOUNTER — Institutional Professional Consult (permissible substitution): Payer: Self-pay | Admitting: Neurology

## 2024-11-27 ENCOUNTER — Encounter: Payer: Self-pay | Admitting: Neurology

## 2024-11-27 VITALS — BP 120/65 | HR 64 | Ht 65.0 in | Wt 179.0 lb

## 2024-11-27 DIAGNOSIS — G4719 Other hypersomnia: Secondary | ICD-10-CM

## 2024-11-27 DIAGNOSIS — E663 Overweight: Secondary | ICD-10-CM

## 2024-11-27 DIAGNOSIS — R0683 Snoring: Secondary | ICD-10-CM | POA: Diagnosis not present

## 2024-11-27 DIAGNOSIS — R635 Abnormal weight gain: Secondary | ICD-10-CM

## 2024-11-27 DIAGNOSIS — R519 Headache, unspecified: Secondary | ICD-10-CM | POA: Diagnosis not present

## 2024-11-27 DIAGNOSIS — Z9189 Other specified personal risk factors, not elsewhere classified: Secondary | ICD-10-CM | POA: Diagnosis not present

## 2024-11-27 NOTE — Progress Notes (Signed)
 Subjective:    [name] is a 49 y.o. female.  HPI    True Mar, MD, PhD [facility] [address] [address] [place], [place] [address]  Dear Dr. De Cuba,  I saw your patient, Mia Wilson, upon your kind request in my sleep clinic today for initial consultation of her sleep disorder, in particular, concern for underlying obstructive sleep apnea.  The patient is unaccompanied today.  She missed an appointment on 11/01/2024. As you know, [name] is a 49 year old female with an underlying medical history of diabetes, PCOS, back pain, fibroid disease, and overweight state, who reports snoring and excessive daytime somnolence.  Her Epworth sleepiness score is 11 out of 24, fatigue severity score is 47 out of 63.  I reviewed your office note from 09/21/2024.  She has had some weight gain over time.  Her snoring has become worse over time.  She has woken up with headaches.  Sometimes it feels a sinus pressure type headache.  She goes to bed generally between 9:30 PM and 11 and rise time is generally around 7:15 AM or 7:30 AM.  She is usually a stomach sleeper but does end up on her back at times.  She has had some drooling at night.   She lives with her family including husband and 2 younger sons, ages 26 and 78.  She also has 2 older sons, ages 48 and 73.  They have 2 dogs in the household.  Sometimes the dogs do wake her up.  She has no nightly nocturia.  She is not aware of any family history of sleep apnea.  She drinks caffeine in the form of green tea, up to 2 cups/day, usually utilizing the same teabag.  She drinks no alcohol.  She is a non-smoker.  She works from home for [employer] , in producer, television/film/video.  Her Past Medical History Is Significant For: 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)    Fibroids    PCOS (polycystic ovarian syndrome)     Her Past Surgical History Is  Significant For: Past Surgical History:  Procedure Laterality Date   BREAST EXCISIONAL BIOPSY Right    BREAST SURGERY     benign   IR ANGIOGRAM PELVIS SELECTIVE OR SUPRASELECTIVE  11/17/2018   IR ANGIOGRAM PELVIS SELECTIVE OR SUPRASELECTIVE  11/17/2018   IR ANGIOGRAM SELECTIVE EACH ADDITIONAL VESSEL  11/17/2018   IR ANGIOGRAM SELECTIVE EACH ADDITIONAL VESSEL  11/17/2018   IR EMBO TUMOR ORGAN ISCHEMIA INFARCT INC GUIDE ROADMAPPING  11/17/2018   IR RADIOLOGIST EVAL & MGMT  09/14/2018   IR RADIOLOGIST EVAL & MGMT  12/13/2018   IR RADIOLOGIST EVAL & MGMT  06/28/2019   IR US  GUIDE VASC ACCESS RIGHT  11/17/2018   UTERINE FIBROID SURGERY      Her Family History Is Significant For: Family History  Problem Relation Age of Onset   Healthy Mother    Healthy Father    Diabetes Maternal Uncle    Heart disease Paternal Uncle    Diabetes Maternal Grandmother    Heart disease Maternal Grandmother    Breast cancer Maternal Grandmother        diagnosed in her 42's   Diabetes Paternal Grandmother    Hyperlipidemia Other    Hypertension Other    Stroke Other    Cancer Other    Sleep apnea Neg Hx  Her Social History Is Significant For: Social History   Socioeconomic History   Marital status: Married    Spouse name: Not on file   Number of children: 4   Years of education: Not on file   Highest education level: Master's degree (e.g., MA, MS, MEng, MEd, MSW, MBA)  Occupational History   Occupation: research admn.  Tobacco Use   Smoking status: Never    Passive exposure: Never   Smokeless tobacco: Never  Vaping Use   Vaping status: Never Used  Substance and Sexual Activity   Alcohol use: No   Drug use: No   Sexual activity: Yes  Other Topics Concern   Not on file  Social History Narrative   Living in [place] with husband and two adopted children 15 and 14. Currently on FMLA.  She is not employed at this time. She has been her 2 youngest adopted children at home. Not involved a  lot else here at this time. She has 2 other children also. She walks a little bit but does not do a lot since she has left her with fatigue.   Social Drivers of Health   Tobacco Use: Low Risk (11/27/2024)   Patient History    Smoking Tobacco Use: Never    Smokeless Tobacco Use: Never    Passive Exposure: Never  Financial Resource Strain: Medium Risk (09/21/2024)   Overall Financial Resource Strain (CARDIA)    Difficulty of Paying Living Expenses: Somewhat hard  Food Insecurity: No Food Insecurity (09/21/2024)   Epic    Worried About Programme Researcher, Broadcasting/film/video in the Last Year: Never true    Ran Out of Food in the Last Year: Never true  Transportation Needs: No Transportation Needs (09/21/2024)   Epic    Lack of Transportation (Medical): No    Lack of Transportation (Non-Medical): No  Physical Activity: Insufficiently Active (09/21/2024)   Exercise Vital Sign    Days of Exercise per Week: 1 day    Minutes of Exercise per Session: 20 min  Stress: Stress Concern Present (09/21/2024)   [employer] of Occupational Health - Occupational Stress Questionnaire    Feeling of Stress: Very much  Social Connections: Socially Integrated (09/21/2024)   Social Connection and Isolation Panel    Frequency of Communication with Friends and Family: More than three times a week    Frequency of Social Gatherings with Friends and Family: Once a week    Attends Religious Services: More than 4 times per year    Active Member of Clubs or Organizations: Yes    Attends Banker Meetings: More than 4 times per year    Marital Status: Married  Depression (PHQ2-9): Low Risk (03/22/2024)   Depression (PHQ2-9)    PHQ-2 Score: 2  Alcohol Screen: Not on file  Housing: Low Risk (09/21/2024)   Epic    Unable to Pay for Housing in the Last Year: No    Number of Times Moved in the Last Year: 0    Homeless in the Last Year: No  Utilities: Not on file  Health Literacy: Not on file    Her Allergies  Are:  Allergies[1]:   Her Current Medications Are:  Outpatient Encounter Medications as of 11/27/2024  Medication Sig   atomoxetine  (STRATTERA ) 60 MG capsule Take 1 capsule (60 mg total) by mouth daily.   atorvastatin  (LIPITOR) 40 MG tablet Take 1 tablet (40 mg total) by mouth daily.   Continuous Glucose Sensor (DEXCOM G7 SENSOR) MISC Use to  check glucose continuously, change sensor every 10 days   desvenlafaxine  (PRISTIQ ) 100 MG 24 hr tablet Take 1 tablet (100 mg total) by mouth daily.   estradiol (VIVELLE-DOT) 0.05 MG/24HR patch Place 1 patch onto the skin 2 (two) times a week.   famotidine  (PEPCID ) 20 MG tablet TAKE 1 TABLET BY MOUTH EVERYDAY AT BEDTIME   fluticasone  (FLONASE ) 50 MCG/ACT nasal spray Place 1 spray into both nostrils daily.   Glucagon  (GVOKE HYPOPEN  1-PACK) 1 MG/0.2ML SOAJ Inject 1 mg into the skin as needed (low blood sugar with impaired consciousness).   Insulin  Disposable Pump (OMNIPOD 5 DEXG7G6 PODS GEN 5) MISC 1 Device by Other route every 3 (three) days.   insulin  glargine (LANTUS  SOLOSTAR) 100 UNIT/ML Solostar Pen INJECT 14 UNITS SUBCUTANEOUSLY EVERY DAY IN CASE OF PUMP FAILURE   insulin  lispro (HUMALOG ) 100 UNIT/ML injection INJECT UPTO 50 UNITS INTO THE SKIN DAILY. USE MAX OF 50 UNITS DAILY VIA INSULIN  PUMP   Multiple Vitamin (MULTIVITAMIN WITH MINERALS) TABS Take 1 tablet by mouth daily.   progesterone (PROMETRIUM) 100 MG capsule    vitamin B-12 (CYANOCOBALAMIN ) 1000 MCG tablet Take 1,000 mcg by mouth daily.   pantoprazole  (PROTONIX ) 40 MG tablet TAKE 1 TABLET BY MOUTH EVERY DAY   phenol (CHLORASEPTIC) 1.4 % LIQD Use as directed 1 spray in the mouth or throat every 2 (two) hours as needed for throat irritation / pain.   No facility-administered encounter medications on file as of 11/27/2024.  :   Review of Systems:  Out of a complete 14 point review of systems, all are reviewed and negative with the exception of these symptoms as listed below:  Review of  Systems  Objective:  Neurological Exam  Physical Exam Physical Examination:   Vitals:   11/27/24 0836  BP: 120/65  Pulse: 64    General Examination: The patient is a very pleasant 49 y.o. female in no acute distress. She appears well-developed and well-nourished and well groomed.   HEENT: Normocephalic, atraumatic, pupils are equal, round and reactive to light, extraocular tracking is good without limitation to gaze excursion or nystagmus noted. No photophobia.  No corrective eye glasses in place. Hearing is grossly intact.  Face is symmetric with normal facial animation. Speech is clear without dysarthria. There is no hypophonia. There is no lip, neck/head, jaw or voice tremor. Neck is supple with full range of passive and active motion. There are no carotid bruits on auscultation.  Airway/Oropharynx exam reveals: No significant mouth dryness, good dental hygiene, mild airway crowding secondary to small airway entry, Mallampati class II, slightly wider and elongated uvula with a wispy end, tonsils small, absent? (Patient reports no tonsillectomy).  She has a mild overbite, slightly skewed teeth alignment.  Tongue protrudes centrally and palate elevates symmetrically.    Chest: Clear to auscultation without wheezing, rhonchi or crackles noted.  Heart: S1+S2+0, regular and normal without murmurs, rubs or gallops noted.   Abdomen: Soft, non-tender and non-distended.  Extremities: There is no pitting edema in the distal lower extremities bilaterally.   Skin: Warm and dry without trophic changes noted.   Musculoskeletal: exam reveals no obvious joint deformities.   Neurologically:  Mental status: The patient is awake, alert and oriented in all 4 spheres. Her immediate and remote memory, attention, language skills and fund of knowledge are appropriate. There is no evidence of aphasia, agnosia, apraxia or anomia. Speech is clear with normal prosody and enunciation. Thought process is  linear. Mood is normal and affect is  normal.  Cranial nerves II - XII are as described above under HEENT exam.  Motor exam: Normal bulk, strength and tone is noted. There is no obvious action or resting tremor.  Fine motor skills and coordination: Intact grossly.  Cerebellar testing: No dysmetria or intention tremor. There is no truncal or gait ataxia.  Sensory exam: intact to light touch in the upper and lower extremities.  Gait, station and balance: She stands easily. No veering to one side is noted. No leaning to one side is noted. Posture is age-appropriate and stance is narrow based. Gait shows normal stride length and normal pace. No problems turning are noted.   Assessment and Plan:  In summary, Garnetta N Almario is a very pleasant 49 y.o.-year old female with an underlying medical history of diabetes, PCOS, back pain, fibroid disease, and overweight state, whose history and physical exam are concerning for sleep disordered breathing, particularly obstructive sleep apnea (OSA). A laboratory attended sleep study is typically considered gold standard for evaluation of sleep disordered breathing.   I had a long chat with the patient about my findings and the diagnosis of sleep apnea, particularly OSA, its prognosis and treatment options. We talked about medical/conservative treatments, surgical interventions and non-pharmacological approaches for symptom control. I explained, in particular, the risks and ramifications of untreated moderate to severe OSA, especially with respect to developing cardiovascular disease down the road, including congestive heart failure (CHF), difficult to treat hypertension, cardiac arrhythmias (particularly A-fib), neurovascular complications including TIA, stroke and dementia. Even type 2 diabetes has, in part, been linked to untreated OSA. Symptoms of untreated OSA may include (but may not be limited to) daytime sleepiness, nocturia (i.e. frequent nighttime urination),  memory problems, mood irritability and suboptimally controlled or worsening mood disorder such as depression and/or anxiety, lack of energy, lack of motivation, physical discomfort, as well as recurrent headaches, especially morning or nocturnal headaches. We talked about the importance of maintaining a healthy lifestyle and striving for healthy weight.   I recommended a sleep study at this time. I outlined the differences between a laboratory attended sleep study which is considered more comprehensive and accurate over the option of a home sleep test (HST); the latter may lead to underestimation of sleep disordered breathing in some instances and does not help with diagnosing upper airway resistance syndrome and is not accurate enough to diagnose primary central sleep apnea typically. I outlined possible surgical and non-surgical treatment options of OSA, including the use of a positive airway pressure (PAP) device (i.e. CPAP, AutoPAP/APAP or BiPAP in certain circumstances), a custom-made dental device (aka oral appliance, which would require a referral to a specialist dentist or orthodontist typically, and is generally speaking not considered for patients with full dentures or edentulous state), upper airway surgical options, such as traditional UPPP (which is not considered a first-line treatment) or the Inspire device (hypoglossal nerve stimulator, which would involve a referral for consultation with an ENT surgeon, after careful selection, following inclusion criteria - also not first-line treatment). I explained the PAP treatment option to the patient in detail, as this is generally considered first-line treatment.  The patient indicated that she would be willing to try PAP therapy, if the need arises. I explained the importance of being compliant with PAP treatment, not only for insurance purposes but primarily to improve patient's symptoms symptoms, and for the patient's long term health benefit, including  to reduce Her cardiovascular risks longer-term.    We will pick up our discussion about  the next steps and treatment options after testing.  We will keep her posted as to the test results by phone call and/or MyChart messaging where possible.  We will plan to follow-up in sleep clinic accordingly as well.  I answered all her questions today and the patient was in agreement.   I encouraged her to call with any interim questions, concerns, problems or updates or email us  through MyChart.  Generally speaking, sleep test authorizations may take up to 2 weeks, sometimes less, sometimes longer, the patient is encouraged to get in touch with us  if they do not hear back from the sleep lab staff directly within the next 2 weeks.  Thank you very much for allowing me to participate in the care of this nice patient. If I can be of any further assistance to you please do not hesitate to call me at [phone].  Sincerely,   True Mar, MD, PhD     [1]  Allergies Allergen Reactions   Mounjaro  [Tirzepatide ] Diarrhea and Nausea And Vomiting    Lost 15 pounds in 1 week.  Required IV fluids   Other Anaphylaxis and Hives    Peaches , tomatoes, strawberry   Bupropion Other (See Comments)    Other Reaction(s): doesn't tolerate doses over 300mg ,tremors   Strawberry Extract Hives    Facial hives and mouth hives   Tomato Hives    Facial hives and mouth hives

## 2024-11-27 NOTE — Patient Instructions (Signed)

## 2024-11-29 ENCOUNTER — Other Ambulatory Visit (HOSPITAL_COMMUNITY): Payer: Self-pay

## 2024-11-29 NOTE — Telephone Encounter (Signed)
 Pharmacy Patient Advocate Encounter  Received notification from [employer] [employer] that Prior Authorization for Omnipod 5 DexG7G6 Pods Gen 5  has been APPROVED from 11/24/24 to 11/24/25. Unable to obtain price due to refill too soon rejection, last fill date 11/27/24 next available fill date 02/03/25   PA #/Case ID/Reference #: [id number]

## 2024-12-05 ENCOUNTER — Encounter: Payer: Self-pay | Admitting: "Endocrinology

## 2024-12-05 ENCOUNTER — Ambulatory Visit: Admitting: "Endocrinology

## 2024-12-05 VITALS — BP 124/80 | HR 87 | Ht 65.0 in | Wt 178.0 lb

## 2024-12-05 DIAGNOSIS — E1065 Type 1 diabetes mellitus with hyperglycemia: Secondary | ICD-10-CM

## 2024-12-05 DIAGNOSIS — E663 Overweight: Secondary | ICD-10-CM

## 2024-12-05 DIAGNOSIS — Z794 Long term (current) use of insulin: Secondary | ICD-10-CM | POA: Diagnosis not present

## 2024-12-05 DIAGNOSIS — F439 Reaction to severe stress, unspecified: Secondary | ICD-10-CM

## 2024-12-05 DIAGNOSIS — Z9641 Presence of insulin pump (external) (internal): Secondary | ICD-10-CM | POA: Diagnosis not present

## 2024-12-05 DIAGNOSIS — E7800 Pure hypercholesterolemia, unspecified: Secondary | ICD-10-CM

## 2024-12-05 LAB — POCT GLYCOSYLATED HEMOGLOBIN (HGB A1C): Hemoglobin A1C: 8.1 % — AB (ref 4.0–5.6)

## 2024-12-05 MED ORDER — RYBELSUS 3 MG PO TABS: 3.0000 mg | ORAL_TABLET | Freq: Every day | ORAL | 1 refills | Status: AC

## 2024-12-05 NOTE — Progress Notes (Signed)
 "   Outpatient Endocrinology Note [name], MD  12/05/2024   Mia Wilson [birth year] [id number]  Referring Provider: de Cuba, Raymond J, MD Primary Care Provider: de Cuba, Raymond J, MD Reason for consultation: Subjective   Assessment & Plan  Diagnoses and all orders for this visit:  Uncontrolled type 1 diabetes mellitus with hyperglycemia (HCC) -     POCT glycosylated hemoglobin (Hb A1C)  Pure hypercholesterolemia  Insulin  pump in place  Insulin  dose changed (HCC)  Stress at home  Overweight  Other orders -     Semaglutide  (RYBELSUS ) 3 MG TABS; Take 1 tablet (3 mg total) by mouth daily.   Diabetes Type I complicated by hyperglycemia C-pep 0.5, BG 212, Insulin  Ab + Lab Results  Component Value Date   GFR 92.07 10/13/2023   Hba1c goal less than 7, current Hba1c is  Lab Results  Component Value Date   HGBA1C 8.1 (A) 12/05/2024   Will recommend the following: OMNIPOD 5 insulin  pump with Humalog  and DexCom G7 BASAL RATES: Midnight = 0.3, 5 AM = 0.45, 9 AM = 0.3 and 7 PM = 0. 45 Bolus settings: I/C ratio 8 (11 am-5am: 6) ISF: 50, Target: 110, with correction over 130. Patient requests wegovy pill for weight loss; given history with mounjaro  recommend to try rybelsus  instead to check tolerance; patient understands the side effects and agreed Stopped farxiga  Stopped mounjaro : Mounjaro  1 dose led to urgent care due to dehydration  Patient going through a lot fo stress at home; discussed ways to improve stress and length in previous and his visit   No known contraindications/side effects to any of above medications Glucagon  discussed and prescribed with refills on 10/20/23  -Last LD and Tg are as follows: Lab Results  Component Value Date   LDLCALC 125 (H) 09/07/2024    Lab Results  Component Value Date   TRIG 48 09/07/2024   -On atorvastatin  10 mg every day: LDL 125 -10/31/24: if compliant with above, recommend increase to atorvastatin  40 mg  every day -Follow low fat diet and exercise   -Blood pressure goal <140/90 - Microalbumin/creatinine goal is < 30 -Last MA/Cr is as follows: Lab Results  Component Value Date   MICROALBUR <0.2 07/26/2024   -not on ACE/ARB  -diet changes including salt restriction -limit eating outside -counseled BP targets per standards of diabetes care -uncontrolled blood pressure can lead to retinopathy, nephropathy and cardiovascular and atherosclerotic heart disease  Reviewed and counseled on: -A1C target -Blood sugar targets -Complications of uncontrolled diabetes  -Checking blood sugar before meals and bedtime and bring log next visit -All medications with mechanism of action and side effects -Hypoglycemia management: rule of 15's, Glucagon  Emergency Kit and medical alert ID -low-carb low-fat plate-method diet -At least 20 minutes of physical activity per day -Annual dilated retinal eye exam and foot exam -compliance and follow up needs -follow up as scheduled or earlier if problem gets worse  Call if blood sugar is less than 70 or consistently above 250    Take a 15 gm snack of carbohydrate at bedtime before you go to sleep if your blood sugar is less than 100.    If you are going to fast after midnight for a test or procedure, ask your physician for instructions on how to reduce/decrease your insulin  dose.    Call if blood sugar is less than 70 or consistently above 250  -Treating a low sugar by rule of 15  (15 gms of sugar every 15  min until sugar is more than 70) If you feel your sugar is low, test your sugar to be sure If your sugar is low (less than 70), then take 15 grams of a fast acting Carbohydrate (3-4 glucose tablets or glucose gel or 4 ounces of juice or regular soda) Recheck your sugar 15 min after treating low to make sure it is more than 70 If sugar is still less than 70, treat again with 15 grams of carbohydrate          Don't drive the hour of hypoglycemia  If  unconscious/unable to eat or drink by mouth, use glucagon  injection or nasal spray baqsimi and call 911. Can repeat again in 15 min if still unconscious.  Return in about 4 weeks (around 01/02/2025).   I have reviewed current medications, nurse's notes, allergies, vital signs, past medical and surgical history, family medical history, and social history for this encounter. Counseled patient on symptoms, examination findings, lab findings, imaging results, treatment decisions and monitoring and prognosis. The patient understood the recommendations and agrees with the treatment plan. All questions regarding treatment plan were fully answered.  [name], MD  12/05/2024   History of Present Illness Mia Wilson is a 49 y.o. year old female who presents for follow up of Type II diabetes mellitus.  Mia Wilson was first diagnosed in 2009.   Diabetes education +  Diagnosis: date of diagnosis: 2009, initially asymptomatic  RECENT history:   Current pump settings:  OMNIPOD 5 insulin  pump with Humalog  and DexCom G7 BASAL RATES: Midnight = 0.3, 5 AM = 0.45, 9 AM = 0.3 and 7 PM = 0. 45 Bolus settings: I/C ratio 9 (5pm-12am: 9) ISF: 50, Target: 110, with correction over 130.  Insulin : Currently using Humalog   Non-insulin  hypoglycemic drugs: stopped Farxiga  10 mg daily  COMPLICATIONS -  MI/Stroke -  retinopathy -  neuropathy -  nephropathy  BLOOD SUGAR DATA CGM interpretation: At today's visit, we reviewed her CGM downloads. The full report is scanned in the media. Reviewing the CGM trends, BG are elevated after lunch around 12pm-5pm, and improve thereafter; with a lot of variability (high SD and CV).  Physical Exam  BP 124/80   Pulse 87   Ht 5' 5 (1.651 m)   Wt 178 lb (80.7 kg)   LMP 11/10/2018   SpO2 98%   BMI 29.62 kg/m    Constitutional: well developed, well nourished Head: normocephalic, atraumatic Eyes: sclera anicteric, no redness Neck: supple Lungs:  normal respiratory effort Neurology: alert and oriented Skin: dry, no appreciable rashes Musculoskeletal: no appreciable defects Psychiatric: normal mood and affect Diabetic Foot Exam - Simple   No data filed      Current Medications Patient's Medications  New Prescriptions   SEMAGLUTIDE  (RYBELSUS ) 3 MG TABS    Take 1 tablet (3 mg total) by mouth daily.  Previous Medications   ATOMOXETINE  (STRATTERA ) 60 MG CAPSULE    Take 1 capsule (60 mg total) by mouth daily.   ATORVASTATIN  (LIPITOR) 40 MG TABLET    Take 1 tablet (40 mg total) by mouth daily.   CONTINUOUS GLUCOSE SENSOR (DEXCOM G7 SENSOR) MISC    Use to check glucose continuously, change sensor every 10 days   DESVENLAFAXINE  (PRISTIQ ) 100 MG 24 HR TABLET    Take 1 tablet (100 mg total) by mouth daily.   ESTRADIOL (VIVELLE-DOT) 0.05 MG/24HR PATCH    Place 1 patch onto the skin 2 (two) times a week.   FAMOTIDINE  (  PEPCID ) 20 MG TABLET    TAKE 1 TABLET BY MOUTH EVERYDAY AT BEDTIME   FLUTICASONE  (FLONASE ) 50 MCG/ACT NASAL SPRAY    Place 1 spray into both nostrils daily.   GLUCAGON  (GVOKE HYPOPEN  1-PACK) 1 MG/0.2ML SOAJ    Inject 1 mg into the skin as needed (low blood sugar with impaired consciousness).   INSULIN  DISPOSABLE PUMP (OMNIPOD 5 DEXG7G6 PODS GEN 5) MISC    1 Device by Other route every 3 (three) days.   INSULIN  GLARGINE (LANTUS  SOLOSTAR) 100 UNIT/ML SOLOSTAR PEN    INJECT 14 UNITS SUBCUTANEOUSLY EVERY DAY IN CASE OF PUMP FAILURE   INSULIN  LISPRO (HUMALOG ) 100 UNIT/ML INJECTION    INJECT UPTO 50 UNITS INTO THE SKIN DAILY. USE MAX OF 50 UNITS DAILY VIA INSULIN  PUMP   MULTIPLE VITAMIN (MULTIVITAMIN WITH MINERALS) TABS    Take 1 tablet by mouth daily.   PANTOPRAZOLE  (PROTONIX ) 40 MG TABLET    TAKE 1 TABLET BY MOUTH EVERY DAY   PHENOL (CHLORASEPTIC) 1.4 % LIQD    Use as directed 1 spray in the mouth or throat every 2 (two) hours as needed for throat irritation / pain.   PROGESTERONE (PROMETRIUM) 100 MG CAPSULE       VITAMIN B-12  (CYANOCOBALAMIN ) 1000 MCG TABLET    Take 1,000 mcg by mouth daily.  Modified Medications   No medications on file  Discontinued Medications   No medications on file    Allergies Allergies  Allergen Reactions   Mounjaro  [Tirzepatide ] Diarrhea and Nausea And Vomiting    Lost 15 pounds in 1 week.  Required IV fluids   Other Anaphylaxis and Hives    Peaches , tomatoes, strawberry   Bupropion Other (See Comments)    Other Reaction(s): doesn't tolerate doses over 300mg ,tremors   Strawberry Extract Hives    Facial hives and mouth hives   Tomato Hives    Facial hives and mouth hives    Past Medical History 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)    Fibroids    PCOS (polycystic ovarian syndrome)     Past Surgical History Past Surgical History:  Procedure Laterality Date   BREAST EXCISIONAL BIOPSY Right    BREAST SURGERY     benign   IR ANGIOGRAM PELVIS SELECTIVE OR SUPRASELECTIVE  11/17/2018   IR ANGIOGRAM PELVIS SELECTIVE OR SUPRASELECTIVE  11/17/2018   IR ANGIOGRAM SELECTIVE EACH ADDITIONAL VESSEL  11/17/2018   IR ANGIOGRAM SELECTIVE EACH ADDITIONAL VESSEL  11/17/2018   IR EMBO TUMOR ORGAN ISCHEMIA INFARCT INC GUIDE ROADMAPPING  11/17/2018   IR RADIOLOGIST EVAL & MGMT  09/14/2018   IR RADIOLOGIST EVAL & MGMT  12/13/2018   IR RADIOLOGIST EVAL & MGMT  06/28/2019   IR US  GUIDE VASC ACCESS RIGHT  11/17/2018   UTERINE FIBROID SURGERY      Family History family history includes Breast cancer in her maternal grandmother; Cancer in an other family member; Diabetes in her maternal grandmother, maternal uncle, and paternal grandmother; Healthy in her father and mother; Heart disease in her maternal grandmother and paternal uncle; Hyperlipidemia in an other family member; Hypertension in an other family member; Stroke in an other family member.  Social History Social History   Socioeconomic History   Marital status: Married    Spouse  name: Not on file   Number of children: 4   Years of education: Not on file   Highest education level: Master's degree (  e.g., MA, MS, MEng, MEd, MSW, MBA)  Occupational History   Occupation: research admn.  Tobacco Use   Smoking status: Never    Passive exposure: Never   Smokeless tobacco: Never  Vaping Use   Vaping status: Never Used  Substance and Sexual Activity   Alcohol use: No   Drug use: No   Sexual activity: Yes  Other Topics Concern   Not on file  Social History Narrative   Living in [place] with husband and two adopted children 15 and 14. Currently on FMLA.  She is not employed at this time. She has been her 2 youngest adopted children at home. Not involved a lot else here at this time. She has 2 other children also. She walks a little bit but does not do a lot since she has left her with fatigue.   Social Drivers of Health   Tobacco Use: Low Risk (12/05/2024)   Patient History    Smoking Tobacco Use: Never    Smokeless Tobacco Use: Never    Passive Exposure: Never  Financial Resource Strain: Medium Risk (09/21/2024)   Overall Financial Resource Strain (CARDIA)    Difficulty of Paying Living Expenses: Somewhat hard  Food Insecurity: No Food Insecurity (09/21/2024)   Epic    Worried About Programme Researcher, Broadcasting/film/video in the Last Year: Never true    Ran Out of Food in the Last Year: Never true  Transportation Needs: No Transportation Needs (09/21/2024)   Epic    Lack of Transportation (Medical): No    Lack of Transportation (Non-Medical): No  Physical Activity: Insufficiently Active (09/21/2024)   Exercise Vital Sign    Days of Exercise per Week: 1 day    Minutes of Exercise per Session: 20 min  Stress: Stress Concern Present (09/21/2024)   [employer] of Occupational Health - Occupational Stress Questionnaire    Feeling of Stress: Very much  Social Connections: Socially Integrated (09/21/2024)   Social Connection and Isolation Panel    Frequency of  Communication with Friends and Family: More than three times a week    Frequency of Social Gatherings with Friends and Family: Once a week    Attends Religious Services: More than 4 times per year    Active Member of Clubs or Organizations: Yes    Attends Banker Meetings: More than 4 times per year    Marital Status: Married  Catering Manager Violence: Unknown (02/26/2022)   Received from [facility]   HITS    Physically Hurt: Not on file    Insult or Talk Down To: Not on file    Threaten Physical Harm: Not on file    Scream or Curse: Not on file  Depression (PHQ2-9): Low Risk (03/22/2024)   Depression (PHQ2-9)    PHQ-2 Score: 2  Alcohol Screen: Not on file  Housing: Low Risk (09/21/2024)   Epic    Unable to Pay for Housing in the Last Year: No    Number of Times Moved in the Last Year: 0    Homeless in the Last Year: No  Utilities: Not on file  Health Literacy: Not on file    Lab Results  Component Value Date   HGBA1C 8.1 (A) 12/05/2024   HGBA1C 8.1 (H) 09/07/2024   HGBA1C 8.5 (A) 07/31/2024   Lab Results  Component Value Date   CHOL 227 (H) 09/07/2024   Lab Results  Component Value Date   HDL 94 09/07/2024   Lab Results  Component Value Date  LDLCALC 125 (H) 09/07/2024   Lab Results  Component Value Date   TRIG 48 09/07/2024   Lab Results  Component Value Date   CHOLHDL 2.4 09/07/2024   Lab Results  Component Value Date   CREATININE 0.79 09/07/2024   Lab Results  Component Value Date   GFR 92.07 10/13/2023   Lab Results  Component Value Date   MICROALBUR <0.2 07/26/2024      Component Value Date/Time   NA 143 09/07/2024 1131   K 4.9 09/07/2024 1131   CL 102 09/07/2024 1131   CO2 24 09/07/2024 1131   GLUCOSE 101 (H) 09/07/2024 1131   GLUCOSE 93 07/26/2024 1514   BUN 15 09/07/2024 1131   CREATININE 0.79 09/07/2024 1131   CREATININE 0.64 07/26/2024 1514   CALCIUM  9.9 09/07/2024 1131   PROT 6.8 09/07/2024 1131   ALBUMIN 4.4  09/07/2024 1131   AST 24 09/07/2024 1131   ALT 18 09/07/2024 1131   ALKPHOS 117 (H) 09/07/2024 1131   BILITOT 0.4 09/07/2024 1131   GFRNONAA >60 11/15/2023 1609   GFRAA >60 11/17/2018 0758      Latest Ref Rng & Units 09/07/2024   11:31 AM 07/26/2024    3:14 PM 02/10/2024    4:00 PM  BMP  Glucose 70 - 99 mg/dL 898  93  837   BUN 6 - 24 mg/dL 15  11  18    Creatinine 0.57 - 1.00 mg/dL 9.20  9.35  8.87   BUN/Creat Ratio 9 - 23 19  SEE NOTE:  16   Sodium 134 - 144 mmol/L 143  138  143   Potassium 3.5 - 5.2 mmol/L 4.9  4.4  5.2   Chloride 96 - 106 mmol/L 102  100  102   CO2 20 - 29 mmol/L 24  30  CANCELED   Calcium  8.7 - 10.2 mg/dL 9.9  9.2  89.8        Component Value Date/Time   WBC 4.0 09/07/2024 1131   WBC 7.0 11/15/2023 1609   RBC 4.72 09/07/2024 1131   RBC 4.48 11/15/2023 1609   HGB 12.6 09/07/2024 1131   HCT 40.3 09/07/2024 1131   PLT 330 09/07/2024 1131   MCV 85 09/07/2024 1131   MCH 26.7 09/07/2024 1131   MCH 26.3 11/15/2023 1609   MCHC 31.3 (L) 09/07/2024 1131   MCHC 31.5 11/15/2023 1609   RDW 13.0 09/07/2024 1131   LYMPHSABS 2.3 09/07/2024 1131   MONOABS 0.6 11/15/2023 1609   EOSABS 0.1 09/07/2024 1131   BASOSABS 0.0 09/07/2024 1131     Parts of this note may have been dictated using voice recognition software. There may be variances in spelling and vocabulary which are unintentional. Not all errors are proofread. Please notify the dino if any discrepancies are noted or if the meaning of any statement is not clear.   "

## 2024-12-08 ENCOUNTER — Ambulatory Visit: Admitting: Professional Counselor

## 2024-12-10 ENCOUNTER — Other Ambulatory Visit: Payer: Self-pay | Admitting: "Endocrinology

## 2024-12-11 ENCOUNTER — Telehealth: Payer: Self-pay

## 2024-12-11 ENCOUNTER — Other Ambulatory Visit (HOSPITAL_COMMUNITY): Payer: Self-pay

## 2024-12-11 NOTE — Telephone Encounter (Signed)
 Pharmacy Patient Advocate Encounter   Received notification from Patient Advice Request messages that prior authorization for Dexcom G7 sensor is required/requested.   Insurance verification completed.   The patient is insured through [employer].   Per test claim: Refill too soon. PA is not needed at this time. Medication was filled 12/06/2024. Next eligible fill date is 12/29/2024.

## 2024-12-15 ENCOUNTER — Ambulatory Visit: Admitting: Professional Counselor

## 2024-12-20 ENCOUNTER — Telehealth: Payer: Self-pay | Admitting: Neurology

## 2024-12-20 ENCOUNTER — Ambulatory Visit (INDEPENDENT_AMBULATORY_CARE_PROVIDER_SITE_OTHER): Admitting: Behavioral Health

## 2024-12-20 ENCOUNTER — Other Ambulatory Visit (HOSPITAL_COMMUNITY): Payer: Self-pay

## 2024-12-20 NOTE — Telephone Encounter (Signed)
 NPSG [employer] [place] pending

## 2024-12-26 NOTE — Telephone Encounter (Signed)
 [employer] [place]  denied the NPSG see below for the denial.

## 2024-12-27 NOTE — Telephone Encounter (Signed)
 HST [employer] [place]  no auth req via website

## 2024-12-29 ENCOUNTER — Encounter: Payer: Self-pay | Admitting: Professional Counselor

## 2024-12-29 ENCOUNTER — Ambulatory Visit: Admitting: Professional Counselor

## 2024-12-29 DIAGNOSIS — F411 Generalized anxiety disorder: Secondary | ICD-10-CM

## 2024-12-29 DIAGNOSIS — F330 Major depressive disorder, recurrent, mild: Secondary | ICD-10-CM

## 2024-12-29 NOTE — Progress Notes (Unsigned)
"   °      [facility] Counselor/Therapist Progress Note  [name], MRN: [id number],    Date: 12/29/2024  Time Spent: ***   Treatment Type: Individual Therapy  I connected with this patient by an approved telecommunication method (video), with her informed consent, and verifying identity and patient privacy.  I was located at my office and patient at her home.  As needed, we discussed the limitations, risks, and security and privacy concerns associated with telehealth service, including the availability and conditions which currently govern in-person appointments and the possibility that 3rd-party payment may not be fully guaranteed and she may be responsible for charges.  After she indicated understanding, we proceeded with the session.  Also discussed treatment planning, as needed, including ongoing verbal agreement with the plan, the opportunity to ask and answer all questions, her demonstrated understanding of instructions, and her readiness to call the office should symptoms worsen or she feels she is in a crisis state and needs more immediate and tangible assistance.   Reported Symptoms: ***  Mental Status Exam:  Appearance:   Casual     Behavior:  Appropriate and Sharing  Motor:  Normal  Speech/Language:   Clear and Coherent and Normal Rate  Affect:  Appropriate and Congruent  Mood:  normal  Thought process:  normal  Thought content:    WNL  Sensory/Perceptual disturbances:    WNL  Orientation:  oriented to person, place, time/date, and situation  Attention:  Good  Concentration:  Good  Memory:  WNL  Fund of knowledge:   Good  Insight:    Good  Judgment:   Good  Impulse Control:  Good   Risk Assessment: Danger to Self:  No Self-injurious Behavior: No Danger to Others: No Duty to Warn:no Physical Aggression / Violence:No  Access to Firearms a concern: No  Gang Involvement:No   Subjective: ***   Interventions: Solution-Oriented/Positive Psychology,  Humanistic/Existential, and Insight-Oriented  Diagnosis:   ICD-10-CM   1. Generalized anxiety disorder  F41.1     2. Major depressive disorder, recurrent episode, mild  F33.0       Plan: ***  [name], [facility]                   "

## 2025-01-01 ENCOUNTER — Ambulatory Visit: Admitting: "Endocrinology

## 2025-01-19 ENCOUNTER — Ambulatory Visit: Admitting: Professional Counselor

## 2025-02-01 ENCOUNTER — Ambulatory Visit: Admitting: Professional Counselor

## 2025-02-15 ENCOUNTER — Ambulatory Visit: Admitting: Professional Counselor

## 2025-05-08 ENCOUNTER — Ambulatory Visit: Admitting: Dermatology
